# Patient Record
Sex: Female | Born: 1945
Health system: Southern US, Community
[De-identification: ages and names within clinical notes are randomized; demographics above are authoritative.]

## PROBLEM LIST (undated history)

## (undated) DIAGNOSIS — I10 Essential (primary) hypertension: Secondary | ICD-10-CM

## (undated) DIAGNOSIS — H409 Unspecified glaucoma: Secondary | ICD-10-CM

## (undated) DIAGNOSIS — E119 Type 2 diabetes mellitus without complications: Secondary | ICD-10-CM

## (undated) DIAGNOSIS — E785 Hyperlipidemia, unspecified: Secondary | ICD-10-CM

## (undated) HISTORY — DX: Essential (primary) hypertension: I10

## (undated) HISTORY — PX: ABDOMINAL HYSTERECTOMY: SHX81

## (undated) HISTORY — DX: Type 2 diabetes mellitus without complications: E11.9

## (undated) HISTORY — DX: Unspecified glaucoma: H40.9

## (undated) HISTORY — PX: HYSTEROTOMY: SHX1776

## (undated) HISTORY — DX: Hyperlipidemia, unspecified: E78.5

---

## 2004-04-02 ENCOUNTER — Ambulatory Visit: Payer: Self-pay | Admitting: Family Medicine

## 2005-12-21 ENCOUNTER — Emergency Department: Payer: Self-pay | Admitting: Emergency Medicine

## 2006-08-03 ENCOUNTER — Ambulatory Visit: Payer: Self-pay | Admitting: Internal Medicine

## 2006-08-05 ENCOUNTER — Ambulatory Visit: Payer: Self-pay | Admitting: Internal Medicine

## 2006-09-11 ENCOUNTER — Ambulatory Visit: Payer: Self-pay | Admitting: Gastroenterology

## 2006-10-29 ENCOUNTER — Ambulatory Visit: Payer: Self-pay | Admitting: Internal Medicine

## 2009-06-19 ENCOUNTER — Ambulatory Visit: Payer: Self-pay | Admitting: Internal Medicine

## 2009-12-17 DIAGNOSIS — I10 Essential (primary) hypertension: Secondary | ICD-10-CM | POA: Insufficient documentation

## 2009-12-17 DIAGNOSIS — I152 Hypertension secondary to endocrine disorders: Secondary | ICD-10-CM | POA: Insufficient documentation

## 2012-01-26 DIAGNOSIS — E049 Nontoxic goiter, unspecified: Secondary | ICD-10-CM | POA: Insufficient documentation

## 2013-11-04 DIAGNOSIS — F17200 Nicotine dependence, unspecified, uncomplicated: Secondary | ICD-10-CM | POA: Insufficient documentation

## 2014-08-20 DIAGNOSIS — F17210 Nicotine dependence, cigarettes, uncomplicated: Secondary | ICD-10-CM | POA: Diagnosis not present

## 2014-08-20 DIAGNOSIS — E785 Hyperlipidemia, unspecified: Secondary | ICD-10-CM | POA: Diagnosis not present

## 2014-08-20 DIAGNOSIS — I6521 Occlusion and stenosis of right carotid artery: Secondary | ICD-10-CM | POA: Diagnosis not present

## 2014-08-20 DIAGNOSIS — R930 Abnormal findings on diagnostic imaging of skull and head, not elsewhere classified: Secondary | ICD-10-CM | POA: Diagnosis not present

## 2014-08-20 DIAGNOSIS — I10 Essential (primary) hypertension: Secondary | ICD-10-CM | POA: Diagnosis not present

## 2014-08-20 DIAGNOSIS — E049 Nontoxic goiter, unspecified: Secondary | ICD-10-CM | POA: Diagnosis not present

## 2014-08-20 DIAGNOSIS — R42 Dizziness and giddiness: Secondary | ICD-10-CM | POA: Diagnosis not present

## 2014-08-20 DIAGNOSIS — E119 Type 2 diabetes mellitus without complications: Secondary | ICD-10-CM | POA: Diagnosis not present

## 2014-08-20 DIAGNOSIS — Z8673 Personal history of transient ischemic attack (TIA), and cerebral infarction without residual deficits: Secondary | ICD-10-CM | POA: Diagnosis not present

## 2014-08-20 DIAGNOSIS — R51 Headache: Secondary | ICD-10-CM | POA: Diagnosis not present

## 2014-08-20 DIAGNOSIS — R2689 Other abnormalities of gait and mobility: Secondary | ICD-10-CM | POA: Diagnosis not present

## 2014-10-24 DIAGNOSIS — F329 Major depressive disorder, single episode, unspecified: Secondary | ICD-10-CM | POA: Diagnosis not present

## 2014-10-24 DIAGNOSIS — Z23 Encounter for immunization: Secondary | ICD-10-CM | POA: Diagnosis not present

## 2014-10-24 DIAGNOSIS — E782 Mixed hyperlipidemia: Secondary | ICD-10-CM | POA: Diagnosis not present

## 2014-10-24 DIAGNOSIS — Z79899 Other long term (current) drug therapy: Secondary | ICD-10-CM | POA: Diagnosis not present

## 2014-10-24 DIAGNOSIS — E119 Type 2 diabetes mellitus without complications: Secondary | ICD-10-CM | POA: Diagnosis not present

## 2014-10-24 DIAGNOSIS — I10 Essential (primary) hypertension: Secondary | ICD-10-CM | POA: Diagnosis not present

## 2015-02-01 DIAGNOSIS — F329 Major depressive disorder, single episode, unspecified: Secondary | ICD-10-CM | POA: Diagnosis not present

## 2015-02-01 DIAGNOSIS — Z23 Encounter for immunization: Secondary | ICD-10-CM | POA: Diagnosis not present

## 2015-02-01 DIAGNOSIS — Z720 Tobacco use: Secondary | ICD-10-CM | POA: Diagnosis not present

## 2015-02-01 DIAGNOSIS — D329 Benign neoplasm of meninges, unspecified: Secondary | ICD-10-CM | POA: Diagnosis not present

## 2015-02-01 DIAGNOSIS — E782 Mixed hyperlipidemia: Secondary | ICD-10-CM | POA: Diagnosis not present

## 2015-02-01 DIAGNOSIS — E119 Type 2 diabetes mellitus without complications: Secondary | ICD-10-CM | POA: Diagnosis not present

## 2015-02-01 DIAGNOSIS — I10 Essential (primary) hypertension: Secondary | ICD-10-CM | POA: Diagnosis not present

## 2015-08-27 DIAGNOSIS — E139 Other specified diabetes mellitus without complications: Secondary | ICD-10-CM | POA: Diagnosis not present

## 2015-08-27 DIAGNOSIS — K047 Periapical abscess without sinus: Secondary | ICD-10-CM | POA: Insufficient documentation

## 2016-01-24 DIAGNOSIS — Z23 Encounter for immunization: Secondary | ICD-10-CM | POA: Diagnosis not present

## 2016-01-24 DIAGNOSIS — I2510 Atherosclerotic heart disease of native coronary artery without angina pectoris: Secondary | ICD-10-CM | POA: Diagnosis not present

## 2016-01-24 DIAGNOSIS — Z1231 Encounter for screening mammogram for malignant neoplasm of breast: Secondary | ICD-10-CM | POA: Diagnosis not present

## 2016-01-24 DIAGNOSIS — F329 Major depressive disorder, single episode, unspecified: Secondary | ICD-10-CM | POA: Diagnosis not present

## 2016-01-24 DIAGNOSIS — E119 Type 2 diabetes mellitus without complications: Secondary | ICD-10-CM | POA: Diagnosis not present

## 2016-01-24 DIAGNOSIS — E049 Nontoxic goiter, unspecified: Secondary | ICD-10-CM | POA: Diagnosis not present

## 2016-01-24 DIAGNOSIS — I10 Essential (primary) hypertension: Secondary | ICD-10-CM | POA: Diagnosis not present

## 2016-01-24 DIAGNOSIS — Z0000 Encounter for general adult medical examination without abnormal findings: Secondary | ICD-10-CM | POA: Diagnosis not present

## 2016-01-24 DIAGNOSIS — Z720 Tobacco use: Secondary | ICD-10-CM | POA: Diagnosis not present

## 2016-02-11 DIAGNOSIS — Z1231 Encounter for screening mammogram for malignant neoplasm of breast: Secondary | ICD-10-CM | POA: Diagnosis not present

## 2016-05-26 DIAGNOSIS — E119 Type 2 diabetes mellitus without complications: Secondary | ICD-10-CM | POA: Diagnosis not present

## 2016-05-26 DIAGNOSIS — H40023 Open angle with borderline findings, high risk, bilateral: Secondary | ICD-10-CM | POA: Diagnosis not present

## 2016-06-24 ENCOUNTER — Ambulatory Visit: Payer: Self-pay | Admitting: Family Medicine

## 2016-06-24 ENCOUNTER — Ambulatory Visit (INDEPENDENT_AMBULATORY_CARE_PROVIDER_SITE_OTHER): Payer: Medicare HMO | Admitting: Family Medicine

## 2016-06-24 ENCOUNTER — Encounter: Payer: Self-pay | Admitting: Family Medicine

## 2016-06-24 VITALS — BP 132/82 | HR 100 | Resp 16 | Ht 62.0 in | Wt 136.6 lb

## 2016-06-24 DIAGNOSIS — E049 Nontoxic goiter, unspecified: Secondary | ICD-10-CM | POA: Diagnosis not present

## 2016-06-24 DIAGNOSIS — G4700 Insomnia, unspecified: Secondary | ICD-10-CM | POA: Diagnosis not present

## 2016-06-24 DIAGNOSIS — Z23 Encounter for immunization: Secondary | ICD-10-CM

## 2016-06-24 DIAGNOSIS — E119 Type 2 diabetes mellitus without complications: Secondary | ICD-10-CM

## 2016-06-24 DIAGNOSIS — I10 Essential (primary) hypertension: Secondary | ICD-10-CM

## 2016-06-24 DIAGNOSIS — F17200 Nicotine dependence, unspecified, uncomplicated: Secondary | ICD-10-CM | POA: Diagnosis not present

## 2016-06-24 DIAGNOSIS — E1165 Type 2 diabetes mellitus with hyperglycemia: Secondary | ICD-10-CM | POA: Insufficient documentation

## 2016-06-24 MED ORDER — ZOSTER VAC RECOMB ADJUVANTED 50 MCG/0.5ML IM SUSR: 0.5000 mL | Freq: Once | INTRAMUSCULAR | 1 refills | Status: AC

## 2016-06-24 NOTE — Patient Instructions (Signed)
Insomnia Insomnia is a sleep disorder that makes it difficult to fall asleep or to stay asleep. Insomnia can cause tiredness (fatigue), low energy, difficulty concentrating, mood swings, and poor performance at work or school. There are three different ways to classify insomnia:  Difficulty falling asleep.  Difficulty staying asleep.  Waking up too early in the morning. Any type of insomnia can be long-term (chronic) or short-term (acute). Both are common. Short-term insomnia usually lasts for three months or less. Chronic insomnia occurs at least three times a week for longer than three months. What are the causes? Insomnia may be caused by another condition, situation, or substance, such as:  Anxiety.  Certain medicines.  Gastroesophageal reflux disease (GERD) or other gastrointestinal conditions.  Asthma or other breathing conditions.  Restless legs syndrome, sleep apnea, or other sleep disorders.  Chronic pain.  Menopause. This may include hot flashes.  Stroke.  Abuse of alcohol, tobacco, or illegal drugs.  Depression.  Caffeine.  Neurological disorders, such as Alzheimer disease.  An overactive thyroid (hyperthyroidism). The cause of insomnia may not be known. What increases the risk? Risk factors for insomnia include:  Gender. Women are more commonly affected than men.  Age. Insomnia is more common as you get older.  Stress. This may involve your professional or personal life.  Income. Insomnia is more common in people with lower income.  Lack of exercise.  Irregular work schedule or night shifts.  Traveling between different time zones. What are the signs or symptoms? If you have insomnia, trouble falling asleep or trouble staying asleep is the main symptom. This may lead to other symptoms, such as:  Feeling fatigued.  Feeling nervous about going to sleep.  Not feeling rested in the morning.  Having trouble concentrating.  Feeling irritable,  anxious, or depressed. How is this treated? Treatment for insomnia depends on the cause. If your insomnia is caused by an underlying condition, treatment will focus on addressing the condition. Treatment may also include:  Medicines to help you sleep.  Counseling or therapy.  Lifestyle adjustments. Follow these instructions at home:  Take medicines only as directed by your health care provider.  Keep regular sleeping and waking hours. Avoid naps.  Keep a sleep diary to help you and your health care provider figure out what could be causing your insomnia. Include:  When you sleep.  When you wake up during the night.  How well you sleep.  How rested you feel the next day.  Any side effects of medicines you are taking.  What you eat and drink.  Make your bedroom a comfortable place where it is easy to fall asleep:  Put up shades or special blackout curtains to block light from outside.  Use a white noise machine to block noise.  Keep the temperature cool.  Exercise regularly as directed by your health care provider. Avoid exercising right before bedtime.  Use relaxation techniques to manage stress. Ask your health care provider to suggest some techniques that may work well for you. These may include:  Breathing exercises.  Routines to release muscle tension.  Visualizing peaceful scenes.  Cut back on alcohol, caffeinated beverages, and cigarettes, especially close to bedtime. These can disrupt your sleep.  Do not overeat or eat spicy foods right before bedtime. This can lead to digestive discomfort that can make it hard for you to sleep.  Limit screen use before bedtime. This includes:  Watching TV.  Using your smartphone, tablet, and computer.  Stick to a   routine. This can help you fall asleep faster. Try to do a quiet activity, brush your teeth, and go to bed at the same time each night.  Get out of bed if you are still awake after 15 minutes of trying to  sleep. Keep the lights down, but try reading or doing a quiet activity. When you feel sleepy, go back to bed.  Make sure that you drive carefully. Avoid driving if you feel very sleepy.  Keep all follow-up appointments as directed by your health care provider. This is important. Contact a health care provider if:  You are tired throughout the day or have trouble in your daily routine due to sleepiness.  You continue to have sleep problems or your sleep problems get worse. Get help right away if:  You have serious thoughts about hurting yourself or someone else. This information is not intended to replace advice given to you by your health care provider. Make sure you discuss any questions you have with your health care provider. Document Released: 03/07/2000 Document Revised: 08/10/2015 Document Reviewed: 12/09/2013 Elsevier Interactive Patient Education  2017 [employer].   Steps to Quit Smoking Smoking tobacco can be harmful to your health and can affect almost every organ in your body. Smoking puts you, and those around you, at risk for developing many serious chronic diseases. Quitting smoking is difficult, but it is one of the best things that you can do for your health. It is never too late to quit. What are the benefits of quitting smoking? When you quit smoking, you lower your risk of developing serious diseases and conditions, such as:  Lung cancer or lung disease, such as COPD.  Heart disease.  Stroke.  Heart attack.  Infertility.  Osteoporosis and bone fractures. Additionally, symptoms such as coughing, wheezing, and shortness of breath may get better when you quit. You may also find that you get sick less often because your body is stronger at fighting off colds and infections. If you are pregnant, quitting smoking can help to reduce your chances of having a baby of low birth weight. How do I get ready to quit? When you decide to quit smoking, create a plan to make  sure that you are successful. Before you quit:  Pick a date to quit. Set a date within the next two weeks to give you time to prepare.  Write down the reasons why you are quitting. Keep this list in places where you will see it often, such as on your bathroom mirror or in your car or wallet.  Identify the people, places, things, and activities that make you want to smoke (triggers) and avoid them. Make sure to take these actions:  Throw away all cigarettes at home, at work, and in your car.  Throw away smoking accessories, such as Scientist, research (medical).  Clean your car and make sure to empty the ashtray.  Clean your home, including curtains and carpets.  Tell your family, friends, and coworkers that you are quitting. Support from your loved ones can make quitting easier.  Talk with your health care provider about your options for quitting smoking.  Find out what treatment options are covered by your health insurance. What strategies can I use to quit smoking? Talk with your healthcare provider about different strategies to quit smoking. Some strategies include:  Quitting smoking altogether instead of gradually lessening how much you smoke over a period of time. Research shows that quitting "cold [place]" is more successful than gradually  quitting.  Attending in-person counseling to help you build problem-solving skills. You are more likely to have success in quitting if you attend several counseling sessions. Even short sessions of 10 minutes can be effective.  Finding resources and support systems that can help you to quit smoking and remain smoke-free after you quit. These resources are most helpful when you use them often. They can include:  Online chats with a Social worker.  Telephone quitlines.  Printed Furniture conservator/restorer.  Support groups or group counseling.  Text messaging programs.  Mobile phone applications.  Taking medicines to help you quit smoking. (If you are  pregnant or breastfeeding, talk with your health care provider first.) Some medicines contain nicotine and some do not. Both types of medicines help with cravings, but the medicines that include nicotine help to relieve withdrawal symptoms. Your health care provider may recommend:  Nicotine patches, gum, or lozenges.  Nicotine inhalers or sprays.  Non-nicotine medicine that is taken by mouth. Talk with your health care provider about combining strategies, such as taking medicines while you are also receiving in-person counseling. Using these two strategies together makes you more likely to succeed in quitting than if you used either strategy on its own. If you are pregnant or breastfeeding, talk with your health care provider about finding counseling or other support strategies to quit smoking. Do not take medicine to help you quit smoking unless told to do so by your health care provider. What things can I do to make it easier to quit? Quitting smoking might feel overwhelming at first, but there is a lot that you can do to make it easier. Take these important actions:  Reach out to your family and friends and ask that they support and encourage you during this time. Call telephone quitlines, reach out to support groups, or work with a counselor for support.  Ask people who smoke to avoid smoking around you.  Avoid places that trigger you to smoke, such as bars, parties, or smoke-break areas at work.  Spend time around people who do not smoke.  Lessen stress in your life, because stress can be a smoking trigger for some people. To lessen stress, try:  Exercising regularly.  Deep-breathing exercises.  Yoga.  Meditating.  Performing a body scan. This involves closing your eyes, scanning your body from head to toe, and noticing which parts of your body are particularly tense. Purposefully relax the muscles in those areas.  Download or purchase mobile phone or tablet apps (applications)  that can help you stick to your quit plan by providing reminders, tips, and encouragement. There are many free apps, such as QuitGuide from the [employer] Office manager for Disease Control and Prevention). You can find other support for quitting smoking (smoking cessation) through smokefree.gov and other websites. How will I feel when I quit smoking? Within the first 24 hours of quitting smoking, you may start to feel some withdrawal symptoms. These symptoms are usually most noticeable 2-3 days after quitting, but they usually do not last beyond 2-3 weeks. Changes or symptoms that you might experience include:  Mood swings.  Restlessness, anxiety, or irritation.  Difficulty concentrating.  Dizziness.  Strong cravings for sugary foods in addition to nicotine.  Mild weight gain.  Constipation.  Nausea.  Coughing or a sore throat.  Changes in how your medicines work in your body.  A depressed mood.  Difficulty sleeping (insomnia). After the first 2-3 weeks of quitting, you may start to notice more positive results, such as:  Improved sense of smell and taste.  Decreased coughing and sore throat.  Slower heart rate.  Lower blood pressure.  Clearer skin.  The ability to breathe more easily.  Fewer sick days. Quitting smoking is very challenging for most people. Do not get discouraged if you are not successful the first time. Some people need to make many attempts to quit before they achieve long-term success. Do your best to stick to your quit plan, and talk with your health care provider if you have any questions or concerns. This information is not intended to replace advice given to you by your health care provider. Make sure you discuss any questions you have with your health care provider. Document Released: 03/04/2001 Document Revised: 11/06/2015 Document Reviewed: 07/25/2014 Elsevier Interactive Patient Education  2017 [employer].

## 2016-06-25 LAB — COMPREHENSIVE METABOLIC PANEL
ALK PHOS: 86 IU/L (ref 39–117)
ALT: 17 IU/L (ref 0–32)
AST: 21 IU/L (ref 0–40)
Albumin/Globulin Ratio: 1.6 (ref 1.2–2.2)
Albumin: 4.6 g/dL (ref 3.6–4.8)
BUN/Creatinine Ratio: 10 — ABNORMAL LOW (ref 12–28)
BUN: 8 mg/dL (ref 8–27)
Bilirubin Total: 0.3 mg/dL (ref 0.0–1.2)
CO2: 27 mmol/L (ref 18–29)
CREATININE: 0.83 mg/dL (ref 0.57–1.00)
Calcium: 10 mg/dL (ref 8.7–10.3)
Chloride: 101 mmol/L (ref 96–106)
GFR calc Af Amer: 83 mL/min/{1.73_m2} (ref >59)
GFR, EST NON AFRICAN AMERICAN: 72 mL/min/{1.73_m2} (ref >59)
GLOBULIN, TOTAL: 2.8 g/dL (ref 1.5–4.5)
Glucose: 165 mg/dL — ABNORMAL HIGH (ref 65–99)
Potassium: 4.4 mmol/L (ref 3.5–5.2)
Sodium: 143 mmol/L (ref 134–144)
Total Protein: 7.4 g/dL (ref 6.0–8.5)

## 2016-06-25 LAB — CBC
HEMOGLOBIN: 13.7 g/dL (ref 11.1–15.9)
Hematocrit: 43.1 % (ref 34.0–46.6)
MCH: 24 pg — AB (ref 26.6–33.0)
MCHC: 31.8 g/dL (ref 31.5–35.7)
MCV: 76 fL — AB (ref 79–97)
Platelets: 386 10*3/uL — ABNORMAL HIGH (ref 150–379)
RBC: 5.7 x10E6/uL — AB (ref 3.77–5.28)
RDW: 15.5 % — ABNORMAL HIGH (ref 12.3–15.4)
WBC: 6 10*3/uL (ref 3.4–10.8)

## 2016-06-25 LAB — TSH: TSH: 0.483 u[IU]/mL (ref 0.450–4.500)

## 2016-06-25 LAB — HEMOGLOBIN A1C
Est. average glucose Bld gHb Est-mCnc: 151 mg/dL
Hgb A1c MFr Bld: 6.9 % — ABNORMAL HIGH (ref 4.8–5.6)

## 2016-06-25 LAB — LIPID PANEL
CHOLESTEROL TOTAL: 284 mg/dL — AB (ref 100–199)
Chol/HDL Ratio: 5.4 ratio — ABNORMAL HIGH (ref 0.0–4.4)
HDL: 53 mg/dL (ref >39)
LDL CALC: 196 mg/dL — AB (ref 0–99)
TRIGLYCERIDES: 174 mg/dL — AB (ref 0–149)
VLDL Cholesterol Cal: 35 mg/dL (ref 5–40)

## 2016-06-25 LAB — MICROALBUMIN / CREATININE URINE RATIO
CREATININE, UR: 55.9 mg/dL
MICROALB/CREAT RATIO: 17.2 mg/g{creat} (ref 0.0–30.0)
Microalbumin, Urine: 9.6 ug/mL

## 2016-06-25 LAB — VITAMIN D 25 HYDROXY (VIT D DEFICIENCY, FRACTURES): VIT D 25 HYDROXY: 31.1 ng/mL (ref 30.0–100.0)

## 2016-06-26 ENCOUNTER — Other Ambulatory Visit: Payer: Self-pay | Admitting: Family Medicine

## 2016-06-26 MED ORDER — ATORVASTATIN CALCIUM 40 MG PO TABS: 40.0000 mg | ORAL_TABLET | Freq: Every day | ORAL | 2 refills | Status: DC

## 2016-06-26 NOTE — Progress Notes (Signed)
Date:  06/24/2016   Name:  Ann Chaney   DOB:  [birth year]   MRN:  [id number]  PCP:  Adline Potter, MD    Chief Complaint: Establish Care   History of Present Illness:  This is a 71 y.o. female seen for initial visit. T2DM on metformin, last a1c 6.2% in Oct., HTN on lisinopril since 2009, glaucoma on eye drops, seeing optho next week. Hx hyperthyroidism in 1988, on asa for prevention since 2008. Takes cinnamon, coQ10, and tumeric for prevention as well. Smoker, has quit in past. Would like to quit metformin if possible. Father died MI age 76, mother died breast ca/MI/COPD age 84, sister with glaucoma. Mammo 12/2015 ok, has Cologuard kit from [place]. Imms UTD except zoster.  Review of Systems:  Review of Systems  Constitutional: Negative for chills and fever.  HENT: Negative for ear pain and sinus pain.   Eyes: Negative for pain.  Respiratory: Negative for cough and shortness of breath.   Cardiovascular: Negative for chest pain and leg swelling.  Gastrointestinal: Negative for abdominal pain.  Endocrine: Negative for polydipsia and polyuria.  Genitourinary: Negative for difficulty urinating.  Neurological: Negative for tremors, syncope and light-headedness.    Patient Active Problem List   Diagnosis Date Noted  . Diabetes mellitus type 2, controlled, without complications ([place]) 54/27/0623  . Dental abscess 08/27/2015  . Smoker 11/04/2013  . Goiter 01/26/2012  . Hypertension, benign 12/17/2009    Prior to Admission medications   Medication Sig Start Date End Date Taking? Authorizing Provider  aspirin EC 81 MG tablet Take 81 mg by mouth.   Yes Historical Provider, MD  latanoprost (XALATAN) 0.005 % ophthalmic solution 1 drop at bedtime.   Yes Historical Provider, MD  lisinopril (PRINIVIL,ZESTRIL) 40 MG tablet Take 40 mg by mouth. 01/24/16  Yes Historical Provider, MD  metFORMIN (GLUCOPHAGE) 500 MG tablet Take 1,000 mg by mouth 2 (two) times daily with a meal.  01/24/16  Yes  Historical Provider, MD  Multiple Vitamin (MULTIVITAMIN) capsule Take 1 capsule by mouth daily.   Yes Historical Provider, MD    No Known Allergies  Past Surgical History:  Procedure Laterality Date  . HYSTEROTOMY      Social History  Substance Use Topics  . Smoking status: Current Every Day Smoker    Packs/day: 0.50    Types: Cigarettes  . Smokeless tobacco: Never Used  . Alcohol use No    Family History  Problem Relation Age of Onset  . Cancer Mother   . Emphysema Mother   . Diabetes Mother   . Glaucoma Father   . Dementia Father     Medication list has been reviewed and updated.  Physical Examination: BP 132/82   Pulse 100   Resp 16   Ht _0  (1.575 m)   Wt 136 lb 9.6 oz (62 kg)   SpO2 100%   BMI 24.98 kg/m   Physical Exam  Constitutional: She is oriented to person, place, and time. She appears well-developed and well-nourished.  HENT:  Head: Normocephalic and atraumatic.  Right Ear: External ear normal.  Left Ear: External ear normal.  Nose: Nose normal.  Mouth/Throat: Oropharynx is clear and moist.  TMs clear  Eyes: Conjunctivae and EOM are normal. Pupils are equal, round, and reactive to light.  Neck: Neck supple.  Diffuse moderate goiter  Cardiovascular: Normal rate, regular rhythm and normal heart sounds.   Pulmonary/Chest: Effort normal and breath sounds normal.  Abdominal: Soft. She exhibits no distension and  no mass. There is no tenderness.  Musculoskeletal: She exhibits no edema.  Lymphadenopathy:    She has no cervical adenopathy.  Neurological: She is alert and oriented to person, place, and time. Coordination normal.  Romberg negative, gait normal  Skin: Skin is warm and dry.  Psychiatric: She has a normal mood and affect. Her behavior is normal.  Nursing note and vitals reviewed.   Assessment and Plan:  1. Controlled type 2 diabetes mellitus without complication, without long-term current use of insulin ([place]) Well controlled in  Oct, consider d/c metformin based on labs - HgB A1c - Lipid Profile - Vitamin D (25 hydroxy) - Urine Microalbumin w/creat. ratio  2. Hypertension, benign Well controlled on lisinopril - Comprehensive Metabolic Panel (CMET) - CBC  3. Goiter Hx hyperthyroidism in distant past - TSH  4. Insomnia, unspecified type Sleep hygiene recs given  5. Smoker Strongly advised cessation - Nurse to provide smoking / tobacco cessation education  6. Need for zoster vaccination Shingrix rx sent  Return in about 4 weeks (around 07/22/2016).   45 minutes spent with pt over half in counseling  [facility]  06/26/2016

## 2016-06-26 NOTE — Progress Notes (Signed)
lipitor

## 2016-07-09 DIAGNOSIS — H401131 Primary open-angle glaucoma, bilateral, mild stage: Secondary | ICD-10-CM | POA: Diagnosis not present

## 2016-07-22 ENCOUNTER — Encounter: Payer: Self-pay | Admitting: Family Medicine

## 2016-07-22 ENCOUNTER — Ambulatory Visit (INDEPENDENT_AMBULATORY_CARE_PROVIDER_SITE_OTHER): Payer: Medicare HMO | Admitting: Family Medicine

## 2016-07-22 VITALS — BP 122/80 | HR 93 | Resp 16 | Ht 62.0 in | Wt 134.0 lb

## 2016-07-22 DIAGNOSIS — I10 Essential (primary) hypertension: Secondary | ICD-10-CM | POA: Diagnosis not present

## 2016-07-22 DIAGNOSIS — G4700 Insomnia, unspecified: Secondary | ICD-10-CM | POA: Diagnosis not present

## 2016-07-22 DIAGNOSIS — H578 Other specified disorders of eye and adnexa: Secondary | ICD-10-CM | POA: Diagnosis not present

## 2016-07-22 DIAGNOSIS — H5789 Other specified disorders of eye and adnexa: Secondary | ICD-10-CM

## 2016-07-22 DIAGNOSIS — F17200 Nicotine dependence, unspecified, uncomplicated: Secondary | ICD-10-CM

## 2016-07-22 DIAGNOSIS — E785 Hyperlipidemia, unspecified: Secondary | ICD-10-CM | POA: Diagnosis not present

## 2016-07-22 DIAGNOSIS — E119 Type 2 diabetes mellitus without complications: Secondary | ICD-10-CM

## 2016-07-22 MED ORDER — PRAVASTATIN SODIUM 40 MG PO TABS: 40.0000 mg | ORAL_TABLET | Freq: Every day | ORAL | 2 refills | Status: DC

## 2016-07-22 MED ORDER — GABAPENTIN 100 MG PO CAPS: 100.0000 mg | ORAL_CAPSULE | Freq: Every day | ORAL | 2 refills | Status: DC

## 2016-07-22 NOTE — Progress Notes (Signed)
Date:  07/22/2016   Name:  Ann Chaney   DOB:  [birth year]   MRN:  [id number]  PCP:  Adline Potter, MD    Chief Complaint: Diabetes (100-121 BS avg ) and Conjunctivitis (Feels like something is in right eye. Red and wakes up with it matted shut. Saw Dr. Ellin Mayhew at [facility] center in [place] who said it is allergies but she feels it is not allergies.  Started 06/26/2016  after starting Glaucoma drops but this eye doctor feels it is allergies. )   History of Present Illness:  This is a 71 y.o. female seen for one month f/u. C/o R eye matting since started drops for glaucoma, optho told likley allergies but OS sxs. a1c 6.9% last month, kept on metformin, Lipitor added for HLD but unable to tolerate due to myalgas. Has cut back on sugar and salt in diet. Insomnia worse despite sleep hygiene changes, melatonin no help in past, wakes in middle of night, Denies depression but admits to lots of stress. Still smoking. Had URI three weeks ago, still has cough but getting better.  Review of Systems:  Review of Systems  Constitutional: Negative for chills and fever.  HENT: Negative for ear pain and sinus pressure.   Eyes: Negative for photophobia.  Respiratory: Negative for shortness of breath.   Cardiovascular: Negative for chest pain and leg swelling.  Endocrine: Negative for polydipsia and polyuria.  Genitourinary: Negative for difficulty urinating.  Neurological: Negative for syncope and light-headedness.    Patient Active Problem List   Diagnosis Date Noted  . Insomnia 07/22/2016  . Diabetes mellitus type 2, controlled, without complications ([place]) 14/97/0263  . Dental abscess 08/27/2015  . Smoker 11/04/2013  . Goiter 01/26/2012  . Hypertension, benign 12/17/2009    Prior to Admission medications   Medication Sig Start Date End Date Taking? Authorizing Provider  aspirin EC 81 MG tablet Take 81 mg by mouth.   Yes Historical Provider, MD  latanoprost (XALATAN) 0.005 % ophthalmic solution 1 drop  at bedtime.   Yes Historical Provider, MD  lisinopril (PRINIVIL,ZESTRIL) 40 MG tablet Take 40 mg by mouth. 01/24/16  Yes Historical Provider, MD  metFORMIN (GLUCOPHAGE) 500 MG tablet Take 1,000 mg by mouth 2 (two) times daily with a meal.  01/24/16  Yes Historical Provider, MD  Multiple Vitamin (MULTIVITAMIN) capsule Take 1 capsule by mouth daily.   Yes Historical Provider, MD  gabapentin (NEURONTIN) 100 MG capsule Take 1 capsule (100 mg total) by mouth at bedtime. 07/22/16   Adline Potter, MD  pravastatin (PRAVACHOL) 40 MG tablet Take 1 tablet (40 mg total) by mouth daily. 07/22/16   Adline Potter, MD    No Known Allergies  Past Surgical History:  Procedure Laterality Date  . HYSTEROTOMY      Social History  Substance Use Topics  . Smoking status: Current Every Day Smoker    Packs/day: 0.50    Types: Cigarettes  . Smokeless tobacco: Never Used  . Alcohol use No    Family History  Problem Relation Age of Onset  . Cancer Mother   . Emphysema Mother   . Diabetes Mother   . Glaucoma Father   . Dementia Father     Medication list has been reviewed and updated.  Physical Examination: BP 122/80   Pulse 93   Resp 16   Ht 5\' 2"  (1.575 m)   Wt 134 lb (60.8 kg)   BMI 24.51 kg/m   Physical Exam  Constitutional: She appears well-developed and  well-nourished.  Eyes: EOM are normal. Pupils are equal, round, and reactive to light.  R conjunctiva injected  Cardiovascular: Normal rate, regular rhythm and normal heart sounds.   Pulmonary/Chest: Effort normal and breath sounds normal.  Musculoskeletal: She exhibits no edema.  Neurological: She is alert.  Skin: Skin is warm and dry.  Psychiatric: She has a normal mood and affect. Her behavior is normal.  Nursing note and vitals reviewed.   Assessment and Plan:  1. Controlled type 2 diabetes mellitus without complication, without long-term current use of insulin ([facility]) Continue metformin, weight loss, recheck a1c next visit  2.  Hypertension, benign Well controlled on lisinopril  3. Insomnia, unspecified type Trial gabapentin qhs  4. Smoker Strongly advised cessation  5. Hyperlipidemia, unspecified hyperlipidemia type Trial Pravachol given Lipitor intolerance, consider lipids/ALT next visit  6. Irritation of right eye Pt to call ophtho back, doubt allergy as unilateral  Return in about 2 months (around 09/21/2016).  Satira Anis. [facility]  07/22/2016

## 2016-08-15 ENCOUNTER — Other Ambulatory Visit: Payer: Self-pay | Admitting: Family Medicine

## 2016-08-15 MED ORDER — PRAVASTATIN SODIUM 40 MG PO TABS: 40.0000 mg | ORAL_TABLET | Freq: Every day | ORAL | 3 refills | Status: DC

## 2016-09-15 ENCOUNTER — Telehealth: Payer: Self-pay | Admitting: Family Medicine

## 2016-09-15 NOTE — Telephone Encounter (Signed)
Called pt to schedule Annual Wellness Visit with [facility]  - knb  °

## 2016-09-29 ENCOUNTER — Encounter: Payer: Self-pay | Admitting: Family Medicine

## 2016-09-29 ENCOUNTER — Ambulatory Visit (INDEPENDENT_AMBULATORY_CARE_PROVIDER_SITE_OTHER): Payer: Medicare HMO | Admitting: Family Medicine

## 2016-09-29 VITALS — BP 138/82 | HR 94 | Resp 16 | Ht 62.0 in | Wt 136.8 lb

## 2016-09-29 DIAGNOSIS — I10 Essential (primary) hypertension: Secondary | ICD-10-CM

## 2016-09-29 DIAGNOSIS — R718 Other abnormality of red blood cells: Secondary | ICD-10-CM

## 2016-09-29 DIAGNOSIS — E119 Type 2 diabetes mellitus without complications: Secondary | ICD-10-CM | POA: Diagnosis not present

## 2016-09-29 DIAGNOSIS — Z1211 Encounter for screening for malignant neoplasm of colon: Secondary | ICD-10-CM | POA: Diagnosis not present

## 2016-09-29 DIAGNOSIS — E785 Hyperlipidemia, unspecified: Secondary | ICD-10-CM

## 2016-09-29 DIAGNOSIS — G4700 Insomnia, unspecified: Secondary | ICD-10-CM

## 2016-09-29 DIAGNOSIS — F17200 Nicotine dependence, unspecified, uncomplicated: Secondary | ICD-10-CM

## 2016-09-29 DIAGNOSIS — F325 Major depressive disorder, single episode, in full remission: Secondary | ICD-10-CM | POA: Diagnosis not present

## 2016-09-29 NOTE — Progress Notes (Signed)
Date:  09/29/2016   Name:  Ann Chaney   DOB:  17-Dec-1945   MRN:  [id number]  PCP:  Adline Potter, MD    Chief Complaint: Diabetes (AVG [phone]) and Hyperlipidemia (Cholesterol meds cause nausea )   History of Present Illness:  This is a 71 y.o. female seen for two month f/u. Stopped Pravachol due to nausea, previously intolerant Lipitor. Home BGs in 120s, gabapentin helping insomnia. Still smoking, several recent deaths in family, OTC aids ineffective, not interested in rx at this time. Cologuard came back negative. Eye irritation resolved. Blood work in May showed microcytosis without anemia.  Review of Systems:  Review of Systems  Constitutional: Negative for chills and fever.  Respiratory: Negative for cough and shortness of breath.   Cardiovascular: Negative for chest pain and leg swelling.  Endocrine: Negative for polydipsia and polyuria.  Genitourinary: Negative for difficulty urinating.  Neurological: Negative for syncope and light-headedness.    Patient Active Problem List   Diagnosis Date Noted  . Insomnia 07/22/2016  . Hyperlipidemia 07/22/2016  . Diabetes mellitus type 2, controlled, without complications ([place]) 67/20/9470  . Dental abscess 08/27/2015  . Smoker 11/04/2013  . Goiter 01/26/2012  . Hypertension, benign 12/17/2009    Prior to Admission medications   Medication Sig Start Date End Date Taking? Authorizing Provider  aspirin EC 81 MG tablet Take 81 mg by mouth.   Yes [provider]  gabapentin (NEURONTIN) 100 MG capsule Take 1 capsule (100 mg total) by mouth at bedtime. 07/22/16  Yes Hinley Brimage, Gwyndolyn Saxon, MD  latanoprost (XALATAN) 0.005 % ophthalmic solution 1 drop at bedtime.   Yes [provider]  lisinopril (PRINIVIL,ZESTRIL) 40 MG tablet Take 40 mg by mouth. 01/24/16  Yes [provider]  metFORMIN (GLUCOPHAGE) 500 MG tablet Take 1,000 mg by mouth 2 (two) times daily with a meal.  01/24/16  Yes [provider]  Multiple  Vitamin (MULTIVITAMIN) capsule Take 1 capsule by mouth daily.   Yes [provider]    Allergies  Allergen Reactions  . Atorvastatin Other (See Comments)    Myalgias    Past Surgical History:  Procedure Laterality Date  . HYSTEROTOMY      Social History  Substance Use Topics  . Smoking status: Current Every Day Smoker    Packs/day: 0.50    Types: Cigarettes  . Smokeless tobacco: Never Used  . Alcohol use No    Family History  Problem Relation Age of Onset  . Cancer Mother   . Emphysema Mother   . Diabetes Mother   . Glaucoma Father   . Dementia Father     Medication list has been reviewed and updated.  Physical Examination: BP 138/82   Pulse 94   Resp 16   Ht 5\' 2"  (1.575 m)   Wt 136 lb 12.8 oz (62.1 kg)   SpO2 98%   BMI 25.02 kg/m   Physical Exam  Constitutional: She appears well-developed and well-nourished.  Cardiovascular: Normal rate and normal heart sounds.   Pulmonary/Chest: Effort normal and breath sounds normal.  Musculoskeletal: She exhibits no edema.  Neurological: She is alert.  Skin: Skin is warm and dry.  Psychiatric: She has a normal mood and affect. Her behavior is normal.  Nursing note and vitals reviewed.   Assessment and Plan:  1. Controlled type 2 diabetes mellitus without complication, without long-term current use of insulin (HCC) Well controlled on metformin - HgB A1c - B12  2. Hypertension, benign Adequate control on lisinopril -  CBC  3. Smoker Strongly encouraged cessation  4. Insomnia, unspecified type Improved on gabapentin  5. Hyperlipidemia, unspecified hyperlipidemia type Intolerant statins  6. Microcytosis Recheck CBC  7. Screening for colon cancer Cologuard negative, repeat in 3 yrs  Return in about 3 months (around 12/30/2016).  Satira Anis. [facility]  09/29/2016

## 2016-09-30 LAB — CBC
HEMOGLOBIN: 13.5 g/dL (ref 11.1–15.9)
Hematocrit: 41.7 % (ref 34.0–46.6)
MCH: 24.6 pg — ABNORMAL LOW (ref 26.6–33.0)
MCHC: 32.4 g/dL (ref 31.5–35.7)
MCV: 76 fL — ABNORMAL LOW (ref 79–97)
Platelets: 334 10*3/uL (ref 150–379)
RBC: 5.49 x10E6/uL — AB (ref 3.77–5.28)
RDW: 15.7 % — ABNORMAL HIGH (ref 12.3–15.4)
WBC: 5.6 10*3/uL (ref 3.4–10.8)

## 2016-09-30 LAB — HEMOGLOBIN A1C
ESTIMATED AVERAGE GLUCOSE: 148 mg/dL
Hgb A1c MFr Bld: 6.8 % — ABNORMAL HIGH (ref 4.8–5.6)

## 2016-09-30 LAB — VITAMIN B12: VITAMIN B 12: 369 pg/mL (ref 232–1245)

## 2016-11-17 ENCOUNTER — Encounter: Payer: Self-pay | Admitting: Family Medicine

## 2016-11-17 DIAGNOSIS — F325 Major depressive disorder, single episode, in full remission: Secondary | ICD-10-CM | POA: Insufficient documentation

## 2016-11-17 DIAGNOSIS — R718 Other abnormality of red blood cells: Secondary | ICD-10-CM | POA: Insufficient documentation

## 2016-12-30 ENCOUNTER — Ambulatory Visit (INDEPENDENT_AMBULATORY_CARE_PROVIDER_SITE_OTHER): Payer: Medicare HMO | Admitting: Family Medicine

## 2016-12-30 ENCOUNTER — Encounter: Payer: Self-pay | Admitting: Family Medicine

## 2016-12-30 VITALS — BP 118/78 | HR 98 | Resp 16 | Ht 62.0 in | Wt 137.0 lb

## 2016-12-30 DIAGNOSIS — F17200 Nicotine dependence, unspecified, uncomplicated: Secondary | ICD-10-CM | POA: Diagnosis not present

## 2016-12-30 DIAGNOSIS — I10 Essential (primary) hypertension: Secondary | ICD-10-CM | POA: Diagnosis not present

## 2016-12-30 DIAGNOSIS — E119 Type 2 diabetes mellitus without complications: Secondary | ICD-10-CM | POA: Diagnosis not present

## 2016-12-30 DIAGNOSIS — G4700 Insomnia, unspecified: Secondary | ICD-10-CM | POA: Diagnosis not present

## 2016-12-30 DIAGNOSIS — E785 Hyperlipidemia, unspecified: Secondary | ICD-10-CM

## 2016-12-30 DIAGNOSIS — Z23 Encounter for immunization: Secondary | ICD-10-CM | POA: Diagnosis not present

## 2016-12-30 NOTE — Progress Notes (Signed)
Date:  12/30/2016   Name:  Ann Chaney   DOB:  September 04, 1945   MRN:  [id number]  PCP:  Adline Potter, MD    Chief Complaint: Diabetes   History of Present Illness:  This is a 71 y.o. female seen for 3 month f/u. No new concerns. Still smoking but has cut back. Not really exercising much. Insomnia improved on gabapentin.  Review of Systems:  Review of Systems  Constitutional: Negative for chills and fever.  Respiratory: Negative for cough and shortness of breath.   Cardiovascular: Negative for chest pain and leg swelling.  Endocrine: Negative for polydipsia and polyuria.  Genitourinary: Negative for difficulty urinating.  Neurological: Negative for syncope and light-headedness.    Patient Active Problem List   Diagnosis Date Noted  . Microcytosis 11/17/2016  . Depression, major, single episode, complete remission ([place]) 11/17/2016  . Insomnia 07/22/2016  . Hyperlipidemia 07/22/2016  . Diabetes mellitus type 2, controlled, without complications ([place]) 89/21/1941  . Dental abscess 08/27/2015  . Smoker 11/04/2013  . Goiter 01/26/2012  . Hypertension, benign 12/17/2009    Prior to Admission medications   Medication Sig Start Date End Date Taking? Authorizing Provider  aspirin EC 81 MG tablet Take 81 mg by mouth.   Yes [provider]  gabapentin (NEURONTIN) 100 MG capsule Take 1 capsule (100 mg total) by mouth at bedtime. 07/22/16  Yes Karin Griffith, Gwyndolyn Saxon, MD  latanoprost (XALATAN) 0.005 % ophthalmic solution 1 drop at bedtime.   Yes [provider]  lisinopril (PRINIVIL,ZESTRIL) 40 MG tablet Take 40 mg by mouth. 01/24/16  Yes [provider]  metFORMIN (GLUCOPHAGE) 500 MG tablet Take 1,000 mg by mouth 2 (two) times daily with a meal.  01/24/16  Yes [provider]  Multiple Vitamin (MULTIVITAMIN) capsule Take 1 capsule by mouth daily.   Yes [provider]    Allergies  Allergen Reactions  . Atorvastatin Other (See Comments)    Myalgias   . Pravastatin Sodium Nausea Only    Past Surgical History:  Procedure Laterality Date  . HYSTEROTOMY      Social History  Substance Use Topics  . Smoking status: Current Every Day Smoker    Packs/day: 0.50    Types: Cigarettes  . Smokeless tobacco: Never Used  . Alcohol use No    Family History  Problem Relation Age of Onset  . Cancer Mother   . Emphysema Mother   . Diabetes Mother   . Glaucoma Father   . Dementia Father     Medication list has been reviewed and updated.  Physical Examination: BP 118/78   Pulse 98   Resp 16   Ht 5\' 2"  (1.575 m)   Wt 137 lb (62.1 kg)   SpO2 100%   BMI 25.06 kg/m   Physical Exam  Constitutional: She appears well-developed and well-nourished.  Cardiovascular: Normal rate, regular rhythm and normal heart sounds.   Pulmonary/Chest: Effort normal and breath sounds normal.  Musculoskeletal: She exhibits no edema.  Neurological: She is alert.  Skin: Skin is warm and dry.  Psychiatric: She has a normal mood and affect. Her behavior is normal.  Nursing note and vitals reviewed.   Assessment and Plan:  1. Controlled type 2 diabetes mellitus without complication, without long-term current use of insulin ([place]) Well controlled on metformin/asa - HgB A1c  2. Hypertension, benign Well controlled on lisinopril  3. Smoker Strongly encouraged cessation - Nurse to provide smoking / tobacco cessation education  4. Insomnia, unspecified type Well  controlled on gabapentin  5. Hyperlipidemia, unspecified hyperlipidemia type Intolerant statins  6. Need for influenza vaccination - Flu vaccine HIGH DOSE PF (Fluzone High dose)  7. HM Cologuard negative, zoster imm too expensive per pt, consider hep C screen next visit  Return in about 3 months (around 04/01/2017).  [name], [facility]  12/30/2016

## 2016-12-31 LAB — HEMOGLOBIN A1C
ESTIMATED AVERAGE GLUCOSE: 151 mg/dL
HEMOGLOBIN A1C: 6.9 % — AB (ref 4.8–5.6)

## 2017-01-07 DIAGNOSIS — H401131 Primary open-angle glaucoma, bilateral, mild stage: Secondary | ICD-10-CM | POA: Diagnosis not present

## 2017-01-19 ENCOUNTER — Other Ambulatory Visit: Payer: Self-pay | Admitting: Family Medicine

## 2017-01-19 ENCOUNTER — Telehealth: Payer: Self-pay

## 2017-01-19 MED ORDER — METFORMIN HCL 1000 MG PO TABS: 1000.0000 mg | ORAL_TABLET | Freq: Two times a day (BID) | ORAL | 3 refills | Status: DC

## 2017-01-19 MED ORDER — LISINOPRIL 40 MG PO TABS: 40.0000 mg | ORAL_TABLET | Freq: Every day | ORAL | 3 refills | Status: DC

## 2017-01-19 NOTE — Telephone Encounter (Signed)
Done

## 2017-01-19 NOTE — Telephone Encounter (Signed)
Patient needs refill on Lisinopril sent to [facility] and Metformin.

## 2017-04-01 ENCOUNTER — Other Ambulatory Visit: Payer: Self-pay

## 2017-04-01 ENCOUNTER — Ambulatory Visit (INDEPENDENT_AMBULATORY_CARE_PROVIDER_SITE_OTHER): Payer: Medicare HMO | Admitting: Family Medicine

## 2017-04-01 ENCOUNTER — Encounter: Payer: Self-pay | Admitting: Family Medicine

## 2017-04-01 VITALS — BP 128/76 | HR 98 | Resp 16 | Ht 62.0 in | Wt 141.8 lb

## 2017-04-01 DIAGNOSIS — I10 Essential (primary) hypertension: Secondary | ICD-10-CM | POA: Diagnosis not present

## 2017-04-01 DIAGNOSIS — Z0000 Encounter for general adult medical examination without abnormal findings: Secondary | ICD-10-CM

## 2017-04-01 DIAGNOSIS — E119 Type 2 diabetes mellitus without complications: Secondary | ICD-10-CM

## 2017-04-01 DIAGNOSIS — F17200 Nicotine dependence, unspecified, uncomplicated: Secondary | ICD-10-CM | POA: Diagnosis not present

## 2017-04-01 DIAGNOSIS — G4700 Insomnia, unspecified: Secondary | ICD-10-CM | POA: Diagnosis not present

## 2017-04-01 DIAGNOSIS — E785 Hyperlipidemia, unspecified: Secondary | ICD-10-CM | POA: Diagnosis not present

## 2017-04-01 NOTE — Progress Notes (Signed)
Date:  04/01/2017   Name:  Ann Chaney   DOB:  Feb 27, 1946   MRN:  [id number]  PCP:  Adline Potter, MD    Chief Complaint: Diabetes (BS 100-125 )   History of Present Illness:  This is a 72 y.o. female seen for 3 month f/u. T2DM on controlled on metformin, HTN on lisinopril, insomnia on gabapentin. Weight up 4#, not exercising much. Still smoking 2-3 cigs per day.  Review of Systems:  Review of Systems  Constitutional: Negative for chills and fever.  Respiratory: Negative for cough and shortness of breath.   Cardiovascular: Negative for chest pain and leg swelling.  Neurological: Negative for syncope and light-headedness.    Patient Active Problem List   Diagnosis Date Noted  . Microcytosis 11/17/2016  . Depression, major, single episode, complete remission ([place]) 11/17/2016  . Insomnia 07/22/2016  . Hyperlipidemia 07/22/2016  . Diabetes mellitus type 2, controlled, without complications ([place]) 93/90/3009  . Dental abscess 08/27/2015  . Smoker 11/04/2013  . Goiter 01/26/2012  . Hypertension, benign 12/17/2009    Prior to Admission medications   Medication Sig Start Date End Date Taking? Authorizing Provider  aspirin EC 81 MG tablet Take 81 mg by mouth.   Yes [provider]  gabapentin (NEURONTIN) 100 MG capsule Take 1 capsule (100 mg total) by mouth at bedtime. 07/22/16  Yes Gissela Bloch, Gwyndolyn Saxon, MD  latanoprost (XALATAN) 0.005 % ophthalmic solution 1 drop at bedtime.   Yes [provider]  lisinopril (PRINIVIL,ZESTRIL) 40 MG tablet Take 1 tablet (40 mg total) by mouth daily. 01/19/17  Yes Kamera Dubas, Gwyndolyn Saxon, MD  metFORMIN (GLUCOPHAGE) 1000 MG tablet Take 1 tablet (1,000 mg total) by mouth 2 (two) times daily. 01/19/17  Yes Azaela Caracci, Gwyndolyn Saxon, MD  Multiple Vitamin (MULTIVITAMIN) capsule Take 1 capsule by mouth daily.   Yes [provider]    Allergies  Allergen Reactions  . Atorvastatin Other (See Comments)    Myalgias  . Pravastatin Sodium Nausea Only     Past Surgical History:  Procedure Laterality Date  . HYSTEROTOMY      Social History   Tobacco Use  . Smoking status: Current Every Day Smoker    Packs/day: 0.50    Types: Cigarettes  . Smokeless tobacco: Never Used  Substance Use Topics  . Alcohol use: No  . Drug use: No    Family History  Problem Relation Age of Onset  . Cancer Mother   . Emphysema Mother   . Diabetes Mother   . Glaucoma Father   . Dementia Father     Medication list has been reviewed and updated.  Physical Examination: BP 128/76   Pulse 98   Resp 16   Ht 5\' 2"  (1.575 m)   Wt 141 lb 12.8 oz (64.3 kg)   SpO2 99%   BMI 25.94 kg/m   Physical Exam  Constitutional: She appears well-developed and well-nourished.  Cardiovascular: Normal rate, regular rhythm and normal heart sounds.  Pulmonary/Chest: Effort normal and breath sounds normal.  Musculoskeletal: She exhibits no edema.  Neurological: She is alert.  Skin: Skin is warm and dry.  Psychiatric: She has a normal mood and affect. Her behavior is normal.  Nursing note and vitals reviewed.   Assessment and Plan:  1. Controlled type 2 diabetes mellitus without complication, without long-term current use of insulin ([place]) Well controlled metformin, MCR ok in April, consider d/c asa - HgB A1c  2. Hypertension, benign Well controlled on lisinopril  3. Hyperlipidemia, unspecified hyperlipidemia type  Intolerant multiple statins  4. Smoker Strongly encouraged cessation  5. Insomnia, unspecified type Well controlled on gabapentin  6. Healthcare maintenance - Hepatitis C Antibody  Return in about 3 months (around 06/30/2017).  Satira Anis. [facility]  04/02/2017

## 2017-04-02 ENCOUNTER — Other Ambulatory Visit: Payer: Self-pay | Admitting: Family Medicine

## 2017-04-02 LAB — HEMOGLOBIN A1C
ESTIMATED AVERAGE GLUCOSE: 171 mg/dL
Hgb A1c MFr Bld: 7.6 % — ABNORMAL HIGH (ref 4.8–5.6)

## 2017-04-02 LAB — HEPATITIS C ANTIBODY

## 2017-04-14 ENCOUNTER — Telehealth: Payer: Self-pay

## 2017-04-14 NOTE — Telephone Encounter (Signed)
No indication aspirin beneficial in this setting but if she wants to continue taking 81 mg daily ok with me.

## 2017-04-14 NOTE — Telephone Encounter (Signed)
Patient left rather angry message demanding a call as to why the doctor has discontinued her 85 mg aspirin. She states she has been taking 85 mg aspirin for long time and to leave a message ASAP. I called and LM stating that we went over the labs and discussed that aspirin 81 mg not 85 mg is not something she needs as there is no indication for why she needs to take it. I also stated that this is a recommendation and that we told her she MAY stop it. I told her we try to minimize OTC and RX that are not needed. I said that this is OTC  And was not D/C it was simply a recommendation.

## 2017-07-01 ENCOUNTER — Encounter: Payer: Self-pay | Admitting: Family Medicine

## 2017-07-01 ENCOUNTER — Ambulatory Visit (INDEPENDENT_AMBULATORY_CARE_PROVIDER_SITE_OTHER): Payer: Medicare HMO | Admitting: Family Medicine

## 2017-07-01 VITALS — BP 136/86 | HR 84 | Ht 62.0 in | Wt 140.0 lb

## 2017-07-01 DIAGNOSIS — E119 Type 2 diabetes mellitus without complications: Secondary | ICD-10-CM

## 2017-07-01 DIAGNOSIS — F17200 Nicotine dependence, unspecified, uncomplicated: Secondary | ICD-10-CM

## 2017-07-01 DIAGNOSIS — I10 Essential (primary) hypertension: Secondary | ICD-10-CM | POA: Diagnosis not present

## 2017-07-01 DIAGNOSIS — E785 Hyperlipidemia, unspecified: Secondary | ICD-10-CM | POA: Diagnosis not present

## 2017-07-02 ENCOUNTER — Other Ambulatory Visit: Payer: Self-pay | Admitting: Family Medicine

## 2017-07-02 LAB — COMPREHENSIVE METABOLIC PANEL WITH GFR
ALT: 15 IU/L (ref 0–32)
AST: 15 IU/L (ref 0–40)
Albumin/Globulin Ratio: 1.7 (ref 1.2–2.2)
Albumin: 4.7 g/dL (ref 3.5–4.8)
Alkaline Phosphatase: 98 IU/L (ref 39–117)
BUN/Creatinine Ratio: 11 — ABNORMAL LOW (ref 12–28)
BUN: 9 mg/dL (ref 8–27)
Bilirubin Total: 0.2 mg/dL (ref 0.0–1.2)
CO2: 25 mmol/L (ref 20–29)
Calcium: 10 mg/dL (ref 8.7–10.3)
Chloride: 101 mmol/L (ref 96–106)
Creatinine, Ser: 0.83 mg/dL (ref 0.57–1.00)
GFR calc Af Amer: 83 mL/min/1.73
GFR calc non Af Amer: 72 mL/min/1.73
Globulin, Total: 2.7 g/dL (ref 1.5–4.5)
Glucose: 262 mg/dL — ABNORMAL HIGH (ref 65–99)
Potassium: 4.4 mmol/L (ref 3.5–5.2)
Sodium: 141 mmol/L (ref 134–144)
Total Protein: 7.4 g/dL (ref 6.0–8.5)

## 2017-07-02 LAB — CBC
Hematocrit: 40.8 % (ref 34.0–46.6)
Hemoglobin: 13.1 g/dL (ref 11.1–15.9)
MCH: 23.9 pg — ABNORMAL LOW (ref 26.6–33.0)
MCHC: 32.1 g/dL (ref 31.5–35.7)
MCV: 74 fL — ABNORMAL LOW (ref 79–97)
Platelets: 374 10*3/uL (ref 150–379)
RBC: 5.49 x10E6/uL — AB (ref 3.77–5.28)
RDW: 15.5 % — AB (ref 12.3–15.4)
WBC: 7.4 10*3/uL (ref 3.4–10.8)

## 2017-07-02 LAB — HEMOGLOBIN A1C
Est. average glucose Bld gHb Est-mCnc: 217 mg/dL
Hgb A1c MFr Bld: 9.2 % — ABNORMAL HIGH (ref 4.8–5.6)

## 2017-07-02 LAB — MICROALBUMIN / CREATININE URINE RATIO
Creatinine, Urine: 44.2 mg/dL
Microalb/Creat Ratio: 9.3 mg/g{creat} (ref 0.0–30.0)
Microalbumin, Urine: 4.1 ug/mL

## 2017-07-02 LAB — TSH: TSH: 0.763 u[IU]/mL (ref 0.450–4.500)

## 2017-07-02 MED ORDER — EMPAGLIFLOZIN 10 MG PO TABS: 10.0000 mg | ORAL_TABLET | Freq: Every day | ORAL | 2 refills | Status: DC

## 2017-07-02 NOTE — Progress Notes (Signed)
Date:  07/01/2017   Name:  Ann Chaney   DOB:  1945/05/25   MRN:  [id number]  PCP:  Adline Potter, MD    Chief Complaint: Diabetes (3 month follow up)   History of Present Illness:  This is a 72 y.o. female seen for three month f/u. On increased metformin due to elevated a1c last visit, off asa and gabapentin, insomnia ok.. Still smoking but has cut back.  Review of Systems:  Review of Systems  Constitutional: Negative for chills and fever.  Respiratory: Negative for cough and shortness of breath.   Cardiovascular: Negative for chest pain and leg swelling.  Genitourinary: Negative for difficulty urinating.  Neurological: Negative for syncope and light-headedness.    Patient Active Problem List   Diagnosis Date Noted  . Microcytosis 11/17/2016  . Depression, major, single episode, complete remission ([place]) 11/17/2016  . Insomnia 07/22/2016  . Hyperlipidemia 07/22/2016  . Diabetes mellitus type 2, controlled, without complications ([place]) 45/80/9983  . Dental abscess 08/27/2015  . Smoker 11/04/2013  . Goiter 01/26/2012  . Hypertension, benign 12/17/2009    Prior to Admission medications   Medication Sig Start Date End Date Taking? Authorizing Provider  latanoprost (XALATAN) 0.005 % ophthalmic solution 1 drop at bedtime.   Yes [provider]  lisinopril (PRINIVIL,ZESTRIL) 40 MG tablet Take 1 tablet (40 mg total) by mouth daily. 01/19/17  Yes Palmer Fahrner, Gwyndolyn Saxon, MD  metFORMIN (GLUCOPHAGE) 1000 MG tablet Take 1 tablet (1,000 mg total) by mouth 2 (two) times daily. 01/19/17  Yes Gianlucca Szymborski, Gwyndolyn Saxon, MD  Multiple Vitamin (MULTIVITAMIN) capsule Take 1 capsule by mouth daily.   Yes [provider]    Allergies  Allergen Reactions  . Atorvastatin Other (See Comments)    Myalgias  . Pravastatin Sodium Nausea Only    Past Surgical History:  Procedure Laterality Date  . HYSTEROTOMY      Social History   Tobacco Use  . Smoking status: Current Every Day Smoker    Packs/day: 0.50    Types: Cigarettes  . Smokeless tobacco: Never Used  Substance Use Topics  . Alcohol use: No  . Drug use: No    Family History  Problem Relation Age of Onset  . Cancer Mother   . Emphysema Mother   . Diabetes Mother   . Glaucoma Father   . Dementia Father     Medication list has been reviewed and updated.  Physical Examination: BP 136/86   Pulse 84   Ht 5\' 2"  (1.575 m)   Wt 140 lb (63.5 kg)   BMI 25.61 kg/m   Physical Exam  Constitutional: She appears well-developed and well-nourished.  Cardiovascular: Normal rate, regular rhythm and normal heart sounds.  Pulmonary/Chest: Effort normal and breath sounds normal.  Musculoskeletal: She exhibits no edema.  Neurological: She is alert.  Skin: Skin is warm and dry.  Psychiatric: She has a normal mood and affect. Her behavior is normal.  Nursing note and vitals reviewed.   Assessment and Plan:  1. Controlled type 2 diabetes mellitus without complication, without long-term current use of insulin (HCC) On increased metformin - HgB A1c - Urine Microalbumin w/creat. ratio - TSH  2. Hypertension, benign Adequate control on lisinopril - Comprehensive Metabolic Panel (CMET) - CBC  3. Hyperlipidemia, unspecified hyperlipidemia type Intolerant statins  4. Smoker Strongly encouraged cessation  Return in about 3 months (around 09/30/2017).  Satira Anis. [facility]  07/02/2017

## 2017-08-04 DIAGNOSIS — H401131 Primary open-angle glaucoma, bilateral, mild stage: Secondary | ICD-10-CM | POA: Diagnosis not present

## 2017-08-04 DIAGNOSIS — E133293 Other specified diabetes mellitus with mild nonproliferative diabetic retinopathy without macular edema, bilateral: Secondary | ICD-10-CM | POA: Diagnosis not present

## 2017-08-04 LAB — HM DIABETES EYE EXAM

## 2017-09-02 ENCOUNTER — Telehealth: Payer: Self-pay | Admitting: Family Medicine

## 2017-09-02 NOTE — Telephone Encounter (Signed)
Called to schedule Medicare Annual Wellness Visit with Nurse Health Advisor. If patient returns call, please schedule AWV with NHA any date  ° °Thank you! °For any questions please contact: °Kathryn Brown °[phone]  °Skype [email or web address]  ° °

## 2017-09-04 ENCOUNTER — Other Ambulatory Visit: Payer: Self-pay

## 2017-10-01 ENCOUNTER — Ambulatory Visit (INDEPENDENT_AMBULATORY_CARE_PROVIDER_SITE_OTHER): Payer: Medicare HMO | Admitting: Family Medicine

## 2017-10-01 ENCOUNTER — Encounter: Payer: Self-pay | Admitting: Family Medicine

## 2017-10-01 VITALS — BP 117/77 | HR 87 | Resp 16 | Ht 62.0 in | Wt 137.0 lb

## 2017-10-01 DIAGNOSIS — F17200 Nicotine dependence, unspecified, uncomplicated: Secondary | ICD-10-CM

## 2017-10-01 DIAGNOSIS — E119 Type 2 diabetes mellitus without complications: Secondary | ICD-10-CM

## 2017-10-01 DIAGNOSIS — I10 Essential (primary) hypertension: Secondary | ICD-10-CM

## 2017-10-01 DIAGNOSIS — E785 Hyperlipidemia, unspecified: Secondary | ICD-10-CM

## 2017-10-01 NOTE — Assessment & Plan Note (Signed)
Controlled cont lisinopril 40 mg daily

## 2017-10-01 NOTE — Progress Notes (Signed)
Name: Ann Chaney   MRN: [id number]    DOB: August 30, 1945   Date:10/01/2017       Progress Note  Subjective  Chief Complaint  Chief Complaint  Patient presents with  . Diabetes    Diabetes  She presents for her follow-up diabetic visit. She has type 2 diabetes mellitus. Her disease course has been stable. There are no hypoglycemic associated symptoms. Pertinent negatives for hypoglycemia include no dizziness, headaches or nervousness/anxiousness. Pertinent negatives for diabetes include no blurred vision, no chest pain, no fatigue, no foot paresthesias, no foot ulcerations, no polydipsia, no polyphagia, no polyuria, no visual change, no weakness and no weight loss. There are no hypoglycemic complications. Symptoms are stable. There are no diabetic complications. Risk factors for coronary artery disease include diabetes mellitus, dyslipidemia and hypertension. Current diabetic treatment includes oral agent (monotherapy). She is compliant with treatment all of the time. She is following a generally healthy diet. She participates in exercise intermittently. There is no change in her home blood glucose trend. Her breakfast blood glucose is taken between 8-9 am. Her breakfast blood glucose range is generally 130-140 mg/dl. She does not see a podiatrist.Eye exam is current.    Hypertension, benign Controlled cont lisinopril 40 mg daily   Past Medical History:  Diagnosis Date  . Glaucoma   . Hypertension     Past Surgical History:  Procedure Laterality Date  . HYSTEROTOMY      Family History  Problem Relation Age of Onset  . Cancer Mother   . Emphysema Mother   . Diabetes Mother   . Glaucoma Father   . Dementia Father     Social History   Socioeconomic History  . Marital status: Divorced    Spouse name: Not on file  . Number of children: Not on file  . Years of education: Not on file  . Highest education level: Not on file  Occupational History  . Not on file  Social Needs   . Financial resource strain: Not on file  . Food insecurity:    Worry: Not on file    Inability: Not on file  . Transportation needs:    Medical: Not on file    Non-medical: Not on file  Tobacco Use  . Smoking status: Current Every Day Smoker    Packs/day: 0.50    Types: Cigarettes  . Smokeless tobacco: Never Used  Substance and Sexual Activity  . Alcohol use: No  . Drug use: No  . Sexual activity: Not on file  Lifestyle  . Physical activity:    Days per week: Not on file    Minutes per session: Not on file  . Stress: Not on file  Relationships  . Social connections:    Talks on phone: Not on file    Gets together: Not on file    Attends religious service: Not on file    Active member of club or organization: Not on file    Attends meetings of clubs or organizations: Not on file    Relationship status: Not on file  . Intimate partner violence:    Fear of current or ex partner: Not on file    Emotionally abused: Not on file    Physically abused: Not on file    Forced sexual activity: Not on file  Other Topics Concern  . Not on file  Social History Narrative  . Not on file    Allergies  Allergen Reactions  . Atorvastatin Other (See Comments)  Myalgias  . Pravastatin Sodium Nausea Only    Outpatient Medications Prior to Visit  Medication Sig Dispense Refill  . latanoprost (XALATAN) 0.005 % ophthalmic solution 1 drop at bedtime.    Marland Kitchen lisinopril (PRINIVIL,ZESTRIL) 40 MG tablet Take 1 tablet (40 mg total) by mouth daily. 90 tablet 3  . metFORMIN (GLUCOPHAGE) 1000 MG tablet Take 1 tablet (1,000 mg total) by mouth 2 (two) times daily. 180 tablet 3  . Multiple Vitamin (MULTIVITAMIN) capsule Take 1 capsule by mouth daily.    . empagliflozin (JARDIANCE) 10 MG TABS tablet Take 10 mg by mouth daily. 30 tablet 2   No facility-administered medications prior to visit.     Review of Systems  Constitutional: Negative for chills, fatigue, fever, malaise/fatigue and weight  loss.  HENT: Negative for ear discharge, ear pain and sore throat.   Eyes: Negative for blurred vision.  Respiratory: Negative for cough, sputum production, shortness of breath and wheezing.   Cardiovascular: Negative for chest pain, palpitations and leg swelling.  Gastrointestinal: Negative for abdominal pain, blood in stool, constipation, diarrhea, heartburn, melena and nausea.  Genitourinary: Negative for dysuria, frequency, hematuria and urgency.  Musculoskeletal: Negative for back pain, joint pain, myalgias and neck pain.  Skin: Negative for rash.  Neurological: Negative for dizziness, tingling, sensory change, focal weakness, weakness and headaches.  Endo/Heme/Allergies: Negative for environmental allergies, polydipsia and polyphagia. Does not bruise/bleed easily.  Psychiatric/Behavioral: Negative for depression and suicidal ideas. The patient is not nervous/anxious and does not have insomnia.      Objective  Vitals:   10/01/17 1423  BP: 117/77  Pulse: 87  Resp: 16  SpO2: 100%  Weight: 137 lb (62.1 kg)  Height: 5\' 2"  (1.575 m)    Physical Exam  Constitutional: No distress.  HENT:  Head: Normocephalic and atraumatic.  Right Ear: External ear normal.  Left Ear: External ear normal.  Nose: Nose normal.  Mouth/Throat: Oropharynx is clear and moist.  Eyes: Pupils are equal, round, and reactive to light. Conjunctivae and EOM are normal. Right eye exhibits no discharge. Left eye exhibits no discharge.  Neck: Normal range of motion. Neck supple. No JVD present. No thyromegaly present.  Cardiovascular: Normal rate, regular rhythm, normal heart sounds and intact distal pulses. Exam reveals no gallop and no friction rub.  No murmur heard. Pulmonary/Chest: Effort normal and breath sounds normal.  Abdominal: Soft. Bowel sounds are normal. She exhibits no mass. There is no tenderness. There is no guarding.  Musculoskeletal: Normal range of motion. She exhibits no edema.   Lymphadenopathy:    She has no cervical adenopathy.  Neurological: She is alert. She has normal reflexes.  Skin: Skin is warm and dry. She is not diaphoretic.  Nursing note and vitals reviewed. Patient has been advised of the health risks of smoking and counseled concerning cessation of tobacco products. I spent over 3 minutes for discussion and to answer questions.    Assessment & Plan  Problem List Items Addressed This Visit      Cardiovascular and Mediastinum   Hypertension, benign    Controlled cont lisinopril 40 mg daily        Endocrine   Diabetes mellitus type 2, controlled, without complications ([place]) - Primary   Relevant Orders   Hemoglobin A1c     Other   Smoker   Hyperlipidemia   Relevant Orders   Lipid panel      No orders of the defined types were placed in this encounter.  Dr. [facility]  10/01/17

## 2017-10-02 LAB — LIPID PANEL
Chol/HDL Ratio: 6 ratio — ABNORMAL HIGH (ref 0.0–4.4)
Cholesterol, Total: 253 mg/dL — ABNORMAL HIGH (ref 100–199)
HDL: 42 mg/dL (ref >39)
LDL Calculated: 178 mg/dL — ABNORMAL HIGH (ref 0–99)
TRIGLYCERIDES: 164 mg/dL — AB (ref 0–149)
VLDL Cholesterol Cal: 33 mg/dL (ref 5–40)

## 2017-10-02 LAB — HEMOGLOBIN A1C
Est. average glucose Bld gHb Est-mCnc: 223 mg/dL
HEMOGLOBIN A1C: 9.4 % — AB (ref 4.8–5.6)

## 2017-10-05 ENCOUNTER — Other Ambulatory Visit: Payer: Self-pay

## 2017-10-05 DIAGNOSIS — E0865 Diabetes mellitus due to underlying condition with hyperglycemia: Secondary | ICD-10-CM

## 2017-10-05 MED ORDER — GLIPIZIDE 5 MG PO TABS: 5.0000 mg | ORAL_TABLET | Freq: Two times a day (BID) | ORAL | 1 refills | Status: DC

## 2017-10-29 DIAGNOSIS — F17200 Nicotine dependence, unspecified, uncomplicated: Secondary | ICD-10-CM | POA: Diagnosis not present

## 2017-10-29 DIAGNOSIS — E049 Nontoxic goiter, unspecified: Secondary | ICD-10-CM | POA: Diagnosis not present

## 2017-10-29 DIAGNOSIS — E1169 Type 2 diabetes mellitus with other specified complication: Secondary | ICD-10-CM | POA: Diagnosis not present

## 2017-10-29 DIAGNOSIS — E1165 Type 2 diabetes mellitus with hyperglycemia: Secondary | ICD-10-CM | POA: Diagnosis not present

## 2017-10-29 DIAGNOSIS — E1159 Type 2 diabetes mellitus with other circulatory complications: Secondary | ICD-10-CM | POA: Diagnosis not present

## 2017-10-29 DIAGNOSIS — I10 Essential (primary) hypertension: Secondary | ICD-10-CM | POA: Diagnosis not present

## 2017-10-29 DIAGNOSIS — E782 Mixed hyperlipidemia: Secondary | ICD-10-CM | POA: Diagnosis not present

## 2017-11-10 DIAGNOSIS — F17200 Nicotine dependence, unspecified, uncomplicated: Secondary | ICD-10-CM | POA: Insufficient documentation

## 2017-11-10 DIAGNOSIS — E1169 Type 2 diabetes mellitus with other specified complication: Secondary | ICD-10-CM | POA: Insufficient documentation

## 2017-11-10 DIAGNOSIS — E782 Mixed hyperlipidemia: Secondary | ICD-10-CM

## 2018-01-28 ENCOUNTER — Other Ambulatory Visit: Payer: Self-pay | Admitting: Family Medicine

## 2018-01-29 ENCOUNTER — Other Ambulatory Visit: Payer: Self-pay | Admitting: Family Medicine

## 2018-01-29 ENCOUNTER — Other Ambulatory Visit: Payer: Self-pay

## 2018-01-29 DIAGNOSIS — E0865 Diabetes mellitus due to underlying condition with hyperglycemia: Secondary | ICD-10-CM

## 2018-01-29 MED ORDER — METFORMIN HCL 1000 MG PO TABS: 1000.0000 mg | ORAL_TABLET | Freq: Two times a day (BID) | ORAL | 0 refills | Status: DC

## 2018-02-02 DIAGNOSIS — H401131 Primary open-angle glaucoma, bilateral, mild stage: Secondary | ICD-10-CM | POA: Diagnosis not present

## 2018-02-02 DIAGNOSIS — E133293 Other specified diabetes mellitus with mild nonproliferative diabetic retinopathy without macular edema, bilateral: Secondary | ICD-10-CM | POA: Diagnosis not present

## 2018-02-03 DIAGNOSIS — E049 Nontoxic goiter, unspecified: Secondary | ICD-10-CM | POA: Diagnosis not present

## 2018-03-09 ENCOUNTER — Telehealth: Payer: Self-pay | Admitting: Family Medicine

## 2018-03-09 NOTE — Telephone Encounter (Signed)
Called to schedule AWV-I.  Eligible as of 11/22/12, per [facility].  No Answer; L/M to call Lattie Haw at [phone]. lec

## 2018-03-12 NOTE — Telephone Encounter (Signed)
Returned call to pt. And scheduled AWV-I for 03/29/18 at 2:40 pm. lec

## 2018-03-29 ENCOUNTER — Ambulatory Visit (INDEPENDENT_AMBULATORY_CARE_PROVIDER_SITE_OTHER): Payer: Medicare HMO

## 2018-03-29 VITALS — BP 140/70 | HR 92 | Temp 98.0°F | Resp 16 | Ht 62.0 in | Wt 138.0 lb

## 2018-03-29 DIAGNOSIS — Z0000 Encounter for general adult medical examination without abnormal findings: Secondary | ICD-10-CM

## 2018-03-29 DIAGNOSIS — Z23 Encounter for immunization: Secondary | ICD-10-CM | POA: Diagnosis not present

## 2018-03-29 DIAGNOSIS — Z1231 Encounter for screening mammogram for malignant neoplasm of breast: Secondary | ICD-10-CM

## 2018-03-29 NOTE — Progress Notes (Signed)
Subjective:   Ann Chaney is a 73 y.o. female who presents for an Initial Medicare Annual Wellness Visit.  Review of Systems     Cardiac Risk Factors include: advanced age (>50men, >42 women);diabetes mellitus;dyslipidemia;hypertension;smoking/ tobacco exposure     Objective:    Today's Vitals   03/29/18 1501  BP: 140/70  Pulse: 92  Resp: 16  Temp: 98 F (36.7 C)  TempSrc: Oral  SpO2: 98%  Weight: 138 lb (62.6 kg)  Height: 5\' 2"  (1.575 m)   Body mass index is 25.24 kg/m.  Advanced Directives 03/29/2018 12/30/2016 09/29/2016  Does Patient Have a Medical Advance Directive? No No No  Would patient like information on creating a medical advance directive? Yes (MAU/Ambulatory/Procedural Areas - Information given) - -    Current Medications (verified) Outpatient Encounter Medications as of 03/29/2018  Medication Sig  . glipiZIDE (GLUCOTROL) 5 MG tablet Take 1 tablet (5 mg total) by mouth 2 (two) times daily before a meal.  . latanoprost (XALATAN) 0.005 % ophthalmic solution 1 drop at bedtime.  Marland Kitchen lisinopril (PRINIVIL,ZESTRIL) 40 MG tablet TAKE 1 TABLET BY MOUTH ONCE DAILY  . metFORMIN (GLUCOPHAGE) 1000 MG tablet Take 1 tablet (1,000 mg total) by mouth 2 (two) times daily.  . Multiple Vitamin (MULTIVITAMIN) capsule Take 1 capsule by mouth daily.   No facility-administered encounter medications on file as of 03/29/2018.     Allergies (verified) Atorvastatin and Pravastatin sodium   History: Past Medical History:  Diagnosis Date  . Diabetes mellitus without complication ([place])   . Glaucoma   . Glaucoma   . Hyperlipidemia   . Hypertension    Past Surgical History:  Procedure Laterality Date  . ABDOMINAL HYSTERECTOMY    . HYSTEROTOMY     Family History  Problem Relation Age of Onset  . Cancer Mother        sternum  . Emphysema Mother   . Diabetes Mother   . Glaucoma Father   . Dementia Father    Social History   Socioeconomic History  . Marital status:  Divorced    Spouse name: Not on file  . Number of children: 1  . Years of education: Not on file  . Highest education level: Associate degree: academic program  Occupational History  . Occupation: retired  Scientific laboratory technician  . Financial resource strain: Not very hard  . Food insecurity:    Worry: Never true    Inability: Never true  . Transportation needs:    Medical: No    Non-medical: No  Tobacco Use  . Smoking status: Current Every Day Smoker    Packs/day: 0.50    Types: Cigarettes  . Smokeless tobacco: Never Used  Substance and Sexual Activity  . Alcohol use: No  . Drug use: No  . Sexual activity: Not Currently  Lifestyle  . Physical activity:    Days per week: 0 days    Minutes per session: 0 min  . Stress: Not at all  Relationships  . Social connections:    Talks on phone: More than three times a week    Gets together: More than three times a week    Attends religious service: More than 4 times per year    Active member of club or organization: No    Attends meetings of clubs or organizations: Never    Relationship status: Divorced  Other Topics Concern  . Not on file  Social History Narrative  . Not on file    Tobacco  Counseling Ready to quit: No Counseling given: No   Clinical Intake:  Pre-visit preparation completed: Yes  Pain : No/denies pain     Nutritional Status: BMI 25 -29 Overweight Nutritional Risks: None Diabetes: Yes CBG done?: No Did pt. bring in CBG monitor from home?: No   Nutrition Risk Assessment:  Has the patient had any N/V/D within the last 2 months?  No  Does the patient have any non-healing wounds?  No  Has the patient had any unintentional weight loss or weight gain?  No   Diabetes:  Is the patient diabetic?  Yes  If diabetic, was a CBG obtained today?  No  Did the patient bring in their glucometer from home?  No  How often do you monitor your CBG's? Daily fasting in morning.   Financial Strains and Diabetes  Management:  Are you having any financial strains with the device, your supplies or your medication? No .  Does the patient want to be seen by Chronic Care Management for management of their diabetes?  No  Would the patient like to be referred to a Nutritionist or for Diabetic Management?  No   Diabetic Exams:  Diabetic Eye Exam: Completed 08/04/17 negative retinopathy.   Diabetic Foot Exam: Completed 10/01/17.   How often do you need to have someone help you when you read instructions, pamphlets, or other written materials from your doctor or pharmacy?: 1 - Never What is the last grade level you completed in school?: 2 years college  Interpreter Needed?: No  Information entered by :: Clemetine Marker LPN   Activities of Daily Living In your present state of health, do you have any difficulty performing the following activities: 03/29/2018 10/01/2017  Hearing? N N  Comment declines hearing aids -  Vision? N N  Comment wears reading glasses -  Difficulty concentrating or making decisions? N N  Walking or climbing stairs? N N  Dressing or bathing? N N  Doing errands, shopping? N N  Preparing Food and eating ? N -  Using the Toilet? N -  In the past six months, have you accidently leaked urine? N -  Do you have problems with loss of bowel control? N -  Managing your Medications? N -  Managing your Finances? N -  Housekeeping or managing your Housekeeping? N -  Some recent data might be hidden     Immunizations and Health Maintenance Immunization History  Administered Date(s) Administered  . Influenza, High Dose Seasonal PF 12/30/2016  . Influenza, Seasonal, Injecte, Preservative Fre 12/11/2011  . Influenza,inj,Quad PF,6+ Mos 12/13/2013, 02/01/2015, 01/24/2016  . Pneumococcal Conjugate-13 10/07/2013  . Pneumococcal Polysaccharide-23 10/24/2014  . Tdap 09/11/2009   Health Maintenance Due  Topic Date Due  . INFLUENZA VACCINE  10/22/2017  . MAMMOGRAM  12/22/2017    Patient  Care Team: Juline Patch, MD as PCP - General (Family Medicine)  Indicate any recent Medical Services you may have received from other than [facility] providers in the past year (date may be approximate).     Assessment:   This is a routine wellness examination for Ann Chaney.  Hearing/Vision screen Hearing Screening Comments: Pt has no difficulty hearing  Vision Screening Comments: Annual vision screenings done by Dr. Ellin Mayhew  Dietary issues and exercise activities discussed: Current Exercise Habits: The patient does not participate in regular exercise at present, Exercise limited by: None identified  Goals    . Increase physical activity     Recommend increasing physical activity to 150 minutes  per week.       Depression Screen PHQ 2/9 Scores 03/29/2018 07/01/2017 06/24/2016  PHQ - 2 Score 0 0 0  PHQ- 9 Score - 0 -    Fall Risk Fall Risk  03/29/2018 07/01/2017 06/24/2016  Falls in the past year? 0 No No  Number falls in past yr: 0 - -  Follow up Falls prevention discussed - -    FALL RISK PREVENTION PERTAINING TO THE HOME:  Any stairs in or around the home WITH handrails? No  Home free of loose throw rugs in walkways, pet beds, electrical cords, etc? Yes  Adequate lighting in your home to reduce risk of falls? Yes   ASSISTIVE DEVICES UTILIZED TO PREVENT FALLS:  Life alert? No  Use of a cane, walker or w/c? No  Grab bars in the bathroom? No  Shower chair or bench in shower? No  Elevated toilet seat or a handicapped toilet? No   DME ORDERS:  DME order needed?  No   TIMED UP AND GO:  Was the test performed? Yes .  Length of time to ambulate 10 feet: 5 sec.   GAIT:  Appearance of gait: Gait stead-fast and without the use of an assistive device.  Education: Fall risk prevention has been discussed.  Intervention(s) required? No   Cognitive Function:     6CIT Screen 03/29/2018  What Year? 0 points  What month? 0 points  What time? 0 points  Count back from 20 0 points    Months in reverse 2 points  Repeat phrase 0 points  Total Score 2    Screening Tests Health Maintenance  Topic Date Due  . INFLUENZA VACCINE  10/22/2017  . MAMMOGRAM  12/22/2017  . DEXA SCAN  03/30/2019 (Originally 12/10/2011)  . HEMOGLOBIN A1C  04/03/2018  . OPHTHALMOLOGY EXAM  08/05/2018  . FOOT EXAM  10/02/2018  . TETANUS/TDAP  09/12/2019  . COLONOSCOPY  03/24/2026  . Hepatitis C Screening  Completed  . PNA vac Low Risk Adult  Completed    Qualifies for Shingles Vaccine? Yes  . Due for Shingrix. Education has been provided regarding the importance of this vaccine. Pt has been advised to call insurance company to determine out of pocket expense. Advised may also receive vaccine at local pharmacy or Health Dept. Verbalized acceptance and understanding.  Tdap:Up to date  Flu Vaccine: Due for Flu vaccine. Does the patient want to receive this vaccine today?  Yes . Education has been provided regarding the importance of this vaccine but still declined. Advised may receive this vaccine at local pharmacy or Health Dept. Aware to provide a copy of the vaccination record if obtained from local pharmacy or Health Dept. Verbalized acceptance and understanding.  Pneumococcal Vaccine: Up to date   Cancer Screenings:  Colorectal Screening: Cologuard Completed 2018. Repeat every 3 years; due 2021  Mammogram: Completed 12/23/15. Repeat every year;Ordered today. Pt provided with contact information and advised to call to schedule appt.   Bone Density: pt declined DEXA scan  Lung Cancer Screening: (Low Dose CT Chest recommended if Age 53-80 years, 30 pack-year currently smoking OR have quit w/in 15years.) does not qualify.    Additional Screening:  Hepatitis C Screening: does qualify; Completed 04/01/17  Vision Screening: Recommended annual ophthalmology exams for early detection of glaucoma and other disorders of the eye. Is the patient up to date with their annual eye exam?  Yes  Who  is the provider or what is the name of the office  in which the pt attends annual eye exams? Dr. Ellin Mayhew  Dental Screening: Recommended annual dental exams for proper oral hygiene  Community Resource Referral:  CRR required this visit?  No      Plan:    I have personally reviewed and addressed the Medicare Annual Wellness questionnaire and have noted the following in the patient's chart:  A. Medical and social history B. Use of alcohol, tobacco or illicit drugs  C. Current medications and supplements D. Functional ability and status E.  Nutritional status F.  Physical activity G. Advance directives H. List of other physicians I.  Hospitalizations, surgeries, and ER visits in previous 12 months J.  Chance such as hearing and vision if needed, cognitive and depression L. Referrals and appointments   In addition, I have reviewed and discussed with patient certain preventive protocols, quality metrics, and best practice recommendations. A written personalized care plan for preventive services as well as general preventive health recommendations were provided to patient.   Signed,  Clemetine Marker, LPN Nurse Health Advisor   Nurse Notes: none

## 2018-03-29 NOTE — Patient Instructions (Signed)
Ann Chaney , Thank you for taking time to come for your Medicare Wellness Visit. I appreciate your ongoing commitment to your health goals. Please review the following plan we discussed and let me know if I can assist you in the future.   Screening recommendations/referrals: Colonoscopy: Cologuard completed 2018, repeat in 2021 Mammogram: done 12/23/15. Please call [phone] to schedule your mammogram.  Bone Density: postponed.  Recommended yearly ophthalmology/optometry visit for glaucoma screening and checkup Recommended yearly dental visit for hygiene and checkup  Vaccinations: Influenza vaccine: done today  Pneumococcal vaccine: done 10/24/14 Tdap vaccine: 09/11/09 Shingles vaccine: Shingrix discussed. Please contact your pharmacy for coverage information.     Advanced directives: Advance directive discussed with you today. I have provided a copy for you to complete at home and have notarized. Once this is complete please bring a copy in to our office so we can scan it into your chart.  Conditions/risks identified: Recommend increasing physical activity by joining silver sneakers to 150 minutes per week.   If you wish to quit smoking, help is available. For free tobacco cessation program offerings call the [facility] at (939)316-7982 or Live Well Line at 575-729-3791. You may also visit www.[place].com or email [email or web address] .com for more information on other programs.  Influenza (Flu) Vaccine (Inactivated or Recombinant): What You Need to Know 1. Why get vaccinated? Influenza vaccine can prevent influenza (flu). Flu is a contagious disease that spreads around the [place] every year, usually between October and May. Anyone can get the flu, but it is more dangerous for some people. Infants and young children, people 106 years of age and older, pregnant women, and people with certain health conditions or a weakened immune system are at greatest risk of flu  complications. Pneumonia, bronchitis, sinus infections and ear infections are examples of flu-related complications. If you have a medical condition, such as heart disease, cancer or diabetes, flu can make it worse. Flu can cause fever and chills, sore throat, muscle aches, fatigue, cough, headache, and runny or stuffy nose. Some people may have vomiting and diarrhea, though this is more common in children than adults. Each year thousands of people in the [place] die from flu, and many more are hospitalized. Flu vaccine prevents millions of illnesses and flu-related visits to the doctor each year. 2. Influenza vaccine CDC recommends everyone 66 months of age and older get vaccinated every flu season. Children 6 months through 22 years of age may need 2 doses during a single flu season. Everyone else needs only 1 dose each flu season. It takes about 2 weeks for protection to develop after vaccination. There are many flu viruses, and they are always changing. Each year a new flu vaccine is made to protect against three or four viruses that are likely to cause disease in the upcoming flu season. Even when the vaccine doesn't exactly match these viruses, it may still provide some protection. Influenza vaccine does not cause flu. Influenza vaccine may be given at the same time as other vaccines. 3. Talk with your health care provider Tell your vaccine provider if the person getting the vaccine: Has had an allergic reaction after a previous dose of influenza vaccine, or has any severe, life-threatening allergies. Has ever had Guillain-Barr Syndrome (also called GBS). In some cases, your health care provider may decide to postpone influenza vaccination to a future visit. People with minor illnesses, such as a cold, may be vaccinated. People who are moderately or severely  ill should usually wait until they recover before getting influenza vaccine. Your health care provider can give you more  information. 4. Risks of a vaccine reaction Soreness, redness, and swelling where shot is given, fever, muscle aches, and headache can happen after influenza vaccine. There may be a very small increased risk of Guillain-Barr Syndrome (GBS) after inactivated influenza vaccine (the flu shot). Young children who get the flu shot along with pneumococcal vaccine (PCV13), and/or DTaP vaccine at the same time might be slightly more likely to have a seizure caused by fever. Tell your health care provider if a child who is getting flu vaccine has ever had a seizure. People sometimes faint after medical procedures, including vaccination. Tell your provider if you feel dizzy or have vision changes or ringing in the ears. As with any medicine, there is a very remote chance of a vaccine causing a severe allergic reaction, other serious injury, or death. 5. What if there is a serious problem? An allergic reaction could occur after the vaccinated person leaves the clinic. If you see signs of a severe allergic reaction (hives, swelling of the face and throat, difficulty breathing, a fast heartbeat, dizziness, or weakness), call 9-1-1 and get the person to the nearest hospital. For other signs that concern you, call your health care provider. Adverse reactions should be reported to the Vaccine Adverse Event Reporting System (VAERS). Your health care provider will usually file this report, or you can do it yourself. Visit the VAERS website at www.vaers.SamedayNews.es or call 5053943580.VAERS is only for reporting reactions, and VAERS staff do not give medical advice. 6. The National Vaccine Injury Compensation Program The [employer] Vaccine Injury Compensation Program (VICP) is a federal program that was created to compensate people who may have been injured by certain vaccines. Visit the VICP website at GoldCloset.com.ee or call 272-373-0110 to learn about the program and about filing a claim. There is a  time limit to file a claim for compensation. 7. How can I learn more? Ask your healthcare provider. Call your local or state health department. Contact the Centers for Disease Control and Prevention (CDC): Call 215-831-1795 (1-800-CDC-INFO) or Visit CDC's https://gibson.com/ Vaccine Information Statement (Interim) Inactivated Influenza Vaccine (11/05/2017) This information is not intended to replace advice given to you by your health care provider. Make sure you discuss any questions you have with your health care provider. Document Released: 01/02/2006 Document Revised: 11/09/2017 Document Reviewed: 11/09/2017 Elsevier Interactive Patient Education  2019 [employer].   Next appointment: Please follow up in one year for your [facility]ness visit.     Preventive Care 66 Years and Older, Female Preventive care refers to lifestyle choices and visits with your health care provider that can promote health and wellness. What does preventive care include?  A yearly physical exam. This is also called an annual well check.  Dental exams once or twice a year.  Routine eye exams. Ask your health care provider how often you should have your eyes checked.  Personal lifestyle choices, including:  Daily care of your teeth and gums.  Regular physical activity.  Eating a healthy diet.  Avoiding tobacco and drug use.  Limiting alcohol use.  Practicing safe sex.  Taking low-dose aspirin every day.  Taking vitamin and mineral supplements as recommended by your health care provider. What happens during an annual well check? The services and screenings done by your health care provider during your annual well check will depend on your age, overall health, lifestyle risk  factors, and family history of disease. Counseling  Your health care provider may ask you questions about your:  Alcohol use.  Tobacco use.  Drug use.  Emotional well-being.  Home and relationship  well-being.  Sexual activity.  Eating habits.  History of falls.  Memory and ability to understand (cognition).  Work and work Statistician.  Reproductive health. Screening  You may have the following tests or measurements:  Height, weight, and BMI.  Blood pressure.  Lipid and cholesterol levels. These may be checked every 5 years, or more frequently if you are over 83 years old.  Skin check.  Lung cancer screening. You may have this screening every year starting at age 27 if you have a 30-pack-year history of smoking and currently smoke or have quit within the past 15 years.  Fecal occult blood test (FOBT) of the stool. You may have this test every year starting at age 44.  Flexible sigmoidoscopy or colonoscopy. You may have a sigmoidoscopy every 5 years or a colonoscopy every 10 years starting at age 48.  Hepatitis C blood test.  Hepatitis B blood test.  Sexually transmitted disease (STD) testing.  Diabetes screening. This is done by checking your blood sugar (glucose) after you have not eaten for a while (fasting). You may have this done every 1-3 years.  Bone density scan. This is done to screen for osteoporosis. You may have this done starting at age 25.  Mammogram. This may be done every 1-2 years. Talk to your health care provider about how often you should have regular mammograms. Talk with your health care provider about your test results, treatment options, and if necessary, the need for more tests. Vaccines  Your health care provider may recommend certain vaccines, such as:  Influenza vaccine. This is recommended every year.  Tetanus, diphtheria, and acellular pertussis (Tdap, Td) vaccine. You may need a Td booster every 10 years.  Zoster vaccine. You may need this after age 41.  Pneumococcal 13-valent conjugate (PCV13) vaccine. One dose is recommended after age 60.  Pneumococcal polysaccharide (PPSV23) vaccine. One dose is recommended after age  20. Talk to your health care provider about which screenings and vaccines you need and how often you need them. This information is not intended to replace advice given to you by your health care provider. Make sure you discuss any questions you have with your health care provider. Document Released: 04/06/2015 Document Revised: 11/28/2015 Document Reviewed: 01/09/2015 Elsevier Interactive Patient Education  2017 [place] Prevention in the Home Falls can cause injuries. They can happen to people of all ages. There are many things you can do to make your home safe and to help prevent falls. What can I do on the outside of my home?  Regularly fix the edges of walkways and driveways and fix any cracks.  Remove anything that might make you trip as you walk through a door, such as a raised step or threshold.  Trim any bushes or trees on the path to your home.  Use bright outdoor lighting.  Clear any walking paths of anything that might make someone trip, such as rocks or tools.  Regularly check to see if handrails are loose or broken. Make sure that both sides of any steps have handrails.  Any raised decks and porches should have guardrails on the edges.  Have any leaves, snow, or ice cleared regularly.  Use sand or salt on walking paths during winter.  Clean up any spills in your  garage right away. This includes oil or grease spills. What can I do in the bathroom?  Use night lights.  Install grab bars by the toilet and in the tub and shower. Do not use towel bars as grab bars.  Use non-skid mats or decals in the tub or shower.  If you need to sit down in the shower, use a plastic, non-slip stool.  Keep the floor dry. Clean up any water that spills on the floor as soon as it happens.  Remove soap buildup in the tub or shower regularly.  Attach bath mats securely with double-sided non-slip rug tape.  Do not have throw rugs and other things on the floor that can make  you trip. What can I do in the bedroom?  Use night lights.  Make sure that you have a light by your bed that is easy to reach.  Do not use any sheets or blankets that are too big for your bed. They should not hang down onto the floor.  Have a firm chair that has side arms. You can use this for support while you get dressed.  Do not have throw rugs and other things on the floor that can make you trip. What can I do in the kitchen?  Clean up any spills right away.  Avoid walking on wet floors.  Keep items that you use a lot in easy-to-reach places.  If you need to reach something above you, use a strong step stool that has a grab bar.  Keep electrical cords out of the way.  Do not use floor polish or wax that makes floors slippery. If you must use wax, use non-skid floor wax.  Do not have throw rugs and other things on the floor that can make you trip. What can I do with my stairs?  Do not leave any items on the stairs.  Make sure that there are handrails on both sides of the stairs and use them. Fix handrails that are broken or loose. Make sure that handrails are as long as the stairways.  Check any carpeting to make sure that it is firmly attached to the stairs. Fix any carpet that is loose or worn.  Avoid having throw rugs at the top or bottom of the stairs. If you do have throw rugs, attach them to the floor with carpet tape.  Make sure that you have a light switch at the top of the stairs and the bottom of the stairs. If you do not have them, ask someone to add them for you. What else can I do to help prevent falls?  Wear shoes that:  Do not have high heels.  Have rubber bottoms.  Are comfortable and fit you well.  Are closed at the toe. Do not wear sandals.  If you use a stepladder:  Make sure that it is fully opened. Do not climb a closed stepladder.  Make sure that both sides of the stepladder are locked into place.  Ask someone to hold it for you, if  possible.  Clearly mark and make sure that you can see:  Any grab bars or handrails.  First and last steps.  Where the edge of each step is.  Use tools that help you move around (mobility aids) if they are needed. These include:  Canes.  Walkers.  Scooters.  Crutches.  Turn on the lights when you go into a dark area. Replace any light bulbs as soon as they burn out.  Set  up your furniture so you have a clear path. Avoid moving your furniture around.  If any of your floors are uneven, fix them.  If there are any pets around you, be aware of where they are.  Review your medicines with your doctor. Some medicines can make you feel dizzy. This can increase your chance of falling. Ask your doctor what other things that you can do to help prevent falls. This information is not intended to replace advice given to you by your health care provider. Make sure you discuss any questions you have with your health care provider. Document Released: 01/04/2009 Document Revised: 08/16/2015 Document Reviewed: 04/14/2014 Elsevier Interactive Patient Education  2017 [employer].

## 2018-04-02 ENCOUNTER — Other Ambulatory Visit: Payer: Self-pay | Admitting: Family Medicine

## 2018-04-02 DIAGNOSIS — E0865 Diabetes mellitus due to underlying condition with hyperglycemia: Secondary | ICD-10-CM

## 2018-04-05 ENCOUNTER — Encounter: Payer: Self-pay | Admitting: Family Medicine

## 2018-04-05 ENCOUNTER — Ambulatory Visit (INDEPENDENT_AMBULATORY_CARE_PROVIDER_SITE_OTHER): Payer: Medicare HMO | Admitting: Family Medicine

## 2018-04-05 VITALS — BP 130/70 | HR 82 | Ht 62.0 in | Wt 138.0 lb

## 2018-04-05 DIAGNOSIS — Z91199 Patient's noncompliance with other medical treatment and regimen due to unspecified reason: Secondary | ICD-10-CM

## 2018-04-05 DIAGNOSIS — Z9119 Patient's noncompliance with other medical treatment and regimen: Secondary | ICD-10-CM | POA: Diagnosis not present

## 2018-04-05 DIAGNOSIS — I10 Essential (primary) hypertension: Secondary | ICD-10-CM

## 2018-04-05 DIAGNOSIS — E0865 Diabetes mellitus due to underlying condition with hyperglycemia: Secondary | ICD-10-CM

## 2018-04-05 MED ORDER — METFORMIN HCL 1000 MG PO TABS: 1000.0000 mg | ORAL_TABLET | Freq: Two times a day (BID) | ORAL | 1 refills | Status: DC

## 2018-04-05 MED ORDER — PIOGLITAZONE HCL 15 MG PO TABS: 15.0000 mg | ORAL_TABLET | Freq: Every day | ORAL | 1 refills | Status: DC

## 2018-04-05 MED ORDER — LISINOPRIL 40 MG PO TABS: 40.0000 mg | ORAL_TABLET | Freq: Every day | ORAL | 1 refills | Status: DC

## 2018-04-05 MED ORDER — GLIPIZIDE 5 MG PO TABS: 5.0000 mg | ORAL_TABLET | Freq: Two times a day (BID) | ORAL | 1 refills | Status: DC

## 2018-04-05 NOTE — Progress Notes (Signed)
Date:  04/05/2018   Name:  Ann Chaney   DOB:  11/21/1945   MRN:  [id number]   Chief Complaint: Hypertension  Hypertension  This is a chronic problem. The current episode started more than 1 year ago. The problem has been gradually improving since onset. The problem is controlled. Pertinent negatives include no anxiety, blurred vision, chest pain, headaches, malaise/fatigue, neck pain, orthopnea, palpitations, peripheral edema, PND, shortness of breath or sweats. There are no associated agents to hypertension. Risk factors for coronary artery disease include diabetes mellitus and dyslipidemia. Past treatments include ACE inhibitors. The current treatment provides moderate improvement. There are no compliance problems.  There is no history of angina, kidney disease, CAD/MI, CVA, heart failure, left ventricular hypertrophy, PVD or retinopathy. There is no history of chronic renal disease, hyperaldosteronism or a hypertension causing med.  Diabetes  She presents for her follow-up diabetic visit. She has type 2 diabetes mellitus. Her disease course has been stable. Pertinent negatives for hypoglycemia include no dizziness, headaches, nervousness/anxiousness or sweats. Pertinent negatives for diabetes include no blurred vision, no chest pain, no fatigue, no polydipsia, no polyphagia, no polyuria and no weakness. There are no hypoglycemic complications. Symptoms are worsening. There are no diabetic complications. Pertinent negatives for diabetic complications include no CVA, PVD or retinopathy.    Review of Systems  Constitutional: Negative.  Negative for chills, fatigue, fever, malaise/fatigue and unexpected weight change.  HENT: Negative for congestion, ear discharge, ear pain, rhinorrhea, sinus pressure, sneezing and sore throat.   Eyes: Negative for blurred vision, photophobia, pain, discharge, redness and itching.  Respiratory: Negative for cough, shortness of breath, wheezing and stridor.     Cardiovascular: Negative for chest pain, palpitations, orthopnea and PND.  Gastrointestinal: Negative for abdominal pain, blood in stool, constipation, diarrhea, nausea and vomiting.  Endocrine: Negative for cold intolerance, heat intolerance, polydipsia, polyphagia and polyuria.  Genitourinary: Negative for dysuria, flank pain, frequency, hematuria, menstrual problem, pelvic pain, urgency, vaginal bleeding and vaginal discharge.  Musculoskeletal: Negative for arthralgias, back pain, myalgias and neck pain.  Skin: Negative for rash.  Allergic/Immunologic: Negative for environmental allergies and food allergies.  Neurological: Negative for dizziness, weakness, light-headedness, numbness and headaches.  Hematological: Negative for adenopathy. Does not bruise/bleed easily.  Psychiatric/Behavioral: Negative for dysphoric mood. The patient is not nervous/anxious.     Patient Active Problem List   Diagnosis Date Noted  . Mixed diabetic hyperlipidemia associated with type 2 diabetes mellitus ([place]) 11/10/2017  . Tobacco dependence 11/10/2017  . Microcytosis 11/17/2016  . Depression, major, single episode, complete remission ([place]) 11/17/2016  . Insomnia 07/22/2016  . Hyperlipidemia 07/22/2016  . Type 2 diabetes mellitus with hyperglycemia, without long-term current use of insulin ([place]) 06/24/2016  . Dental abscess 08/27/2015  . Smoker 11/04/2013  . Goiter 01/26/2012  . Hypertension associated with diabetes ([place]) 12/17/2009    Allergies  Allergen Reactions  . Atorvastatin Other (See Comments)    Myalgias  . Pravastatin Sodium Nausea Only    Past Surgical History:  Procedure Laterality Date  . ABDOMINAL HYSTERECTOMY    . HYSTEROTOMY      Social History   Tobacco Use  . Smoking status: Current Every Day Smoker    Packs/day: 0.50    Types: Cigarettes  . Smokeless tobacco: Never Used  Substance Use Topics  . Alcohol use: No  . Drug use: No     Medication list has been  reviewed and updated.  Current Meds  Medication Sig  . latanoprost (  XALATAN) 0.005 % ophthalmic solution 1 drop at bedtime.  . Multiple Vitamin (MULTIVITAMIN) capsule Take 1 capsule by mouth daily.  . [DISCONTINUED] glipiZIDE (GLUCOTROL) 5 MG tablet Take 1 tablet (5 mg total) by mouth 2 (two) times daily before a meal.  . [DISCONTINUED] lisinopril (PRINIVIL,ZESTRIL) 40 MG tablet TAKE 1 TABLET BY MOUTH ONCE DAILY  . [DISCONTINUED] metFORMIN (GLUCOPHAGE) 1000 MG tablet Take 1 tablet (1,000 mg total) by mouth 2 (two) times daily.  . [DISCONTINUED] pioglitazone (ACTOS) 15 MG tablet Take 15 mg by mouth daily.  Marland Kitchen glipiZIDE (GLUCOTROL) 5 MG tablet Take 1 tablet (5 mg total) by mouth 2 (two) times daily before a meal.  . lisinopril (PRINIVIL,ZESTRIL) 40 MG tablet Take 1 tablet (40 mg total) by mouth daily.  . metFORMIN (GLUCOPHAGE) 1000 MG tablet Take 1 tablet (1,000 mg total) by mouth 2 (two) times daily.  . pioglitazone (ACTOS) 15 MG tablet Take 1 tablet (15 mg total) by mouth daily.    PHQ 2/9 Scores 03/29/2018 07/01/2017 06/24/2016  PHQ - 2 Score 0 0 0  PHQ- 9 Score - 0 -    Physical Exam Vitals signs and nursing note reviewed.  Constitutional:      General: She is not in acute distress.    Appearance: She is not diaphoretic.  HENT:     Head: Normocephalic and atraumatic.     Right Ear: External ear normal.     Left Ear: External ear normal.     Nose: Nose normal.  Eyes:     General:        Right eye: No discharge.        Left eye: No discharge.     Conjunctiva/sclera: Conjunctivae normal.     Pupils: Pupils are equal, round, and reactive to light.  Neck:     Musculoskeletal: Normal range of motion and neck supple.     Thyroid: No thyromegaly.     Vascular: No JVD.  Cardiovascular:     Rate and Rhythm: Normal rate and regular rhythm.     Heart sounds: Normal heart sounds. No murmur. No friction rub. No gallop.   Pulmonary:     Effort: Pulmonary effort is normal.     Breath  sounds: Normal breath sounds.  Abdominal:     General: Bowel sounds are normal.     Palpations: Abdomen is soft. There is no mass.     Tenderness: There is no abdominal tenderness. There is no guarding.  Musculoskeletal: Normal range of motion.  Lymphadenopathy:     Cervical: No cervical adenopathy.  Skin:    General: Skin is warm and dry.  Neurological:     Mental Status: She is alert.     Deep Tendon Reflexes: Reflexes are normal and symmetric.     BP 130/70   Pulse 82   Ht 5\' 2"  (1.575 m)   Wt 138 lb (62.6 kg)   BMI 25.24 kg/m   Assessment and Plan: 1. Diabetes mellitus due to underlying condition, uncontrolled, with hyperglycemia ([place]) He is supposed to be on glipizide 5 mg twice daily and metformin 1 g twice daily.  Has not been taking her antidiabetic medications on a regular basis.  But will check an A1c is only been the like the last 4 days.  It was made appointment with endocrinology reestablishing for evaluation and care.   Type Actos 15 mg daily was also called in was not associated per the note. - glipiZIDE (GLUCOTROL) 5 MG tablet; Take 1  tablet (5 mg total) by mouth 2 (two) times daily before a meal.  Dispense: 180 tablet; Refill: 1 - metFORMIN (GLUCOPHAGE) 1000 MG tablet; Take 1 tablet (1,000 mg total) by mouth 2 (two) times daily.  Dispense: 180 tablet; Refill: 1 - Hemoglobin A1c  2. Essential hypertension .  Uncontrolled.  Patient has not been taking medication.  Lisinopril 40 mg refilled.  Renal function panel obtained - Renal Function Panel  3. Noncompliance Has been noncompliance with her medication use.  And is also not continued with her endocrine appointments for her diabetes.  Patient was explained the importance of maintaining this

## 2018-04-06 LAB — RENAL FUNCTION PANEL
Albumin: 4.6 g/dL (ref 3.5–4.8)
BUN/Creatinine Ratio: 10 — ABNORMAL LOW (ref 12–28)
BUN: 8 mg/dL (ref 8–27)
CO2: 23 mmol/L (ref 20–29)
Calcium: 10.3 mg/dL (ref 8.7–10.3)
Chloride: 101 mmol/L (ref 96–106)
Creatinine, Ser: 0.77 mg/dL (ref 0.57–1.00)
GFR calc Af Amer: 90 mL/min/{1.73_m2} (ref >59)
GFR calc non Af Amer: 78 mL/min/{1.73_m2} (ref >59)
Glucose: 97 mg/dL (ref 65–99)
Phosphorus: 4 mg/dL (ref 3.0–4.3)
Potassium: 4.4 mmol/L (ref 3.5–5.2)
Sodium: 142 mmol/L (ref 134–144)

## 2018-04-06 LAB — HEMOGLOBIN A1C
Est. average glucose Bld gHb Est-mCnc: 169 mg/dL
Hgb A1c MFr Bld: 7.5 % — ABNORMAL HIGH (ref 4.8–5.6)

## 2018-04-09 DIAGNOSIS — E1165 Type 2 diabetes mellitus with hyperglycemia: Secondary | ICD-10-CM | POA: Diagnosis not present

## 2018-04-09 DIAGNOSIS — E042 Nontoxic multinodular goiter: Secondary | ICD-10-CM | POA: Diagnosis not present

## 2018-04-09 DIAGNOSIS — I10 Essential (primary) hypertension: Secondary | ICD-10-CM | POA: Diagnosis not present

## 2018-04-09 DIAGNOSIS — E782 Mixed hyperlipidemia: Secondary | ICD-10-CM | POA: Diagnosis not present

## 2018-04-09 DIAGNOSIS — E1159 Type 2 diabetes mellitus with other circulatory complications: Secondary | ICD-10-CM | POA: Diagnosis not present

## 2018-04-09 DIAGNOSIS — F17200 Nicotine dependence, unspecified, uncomplicated: Secondary | ICD-10-CM | POA: Diagnosis not present

## 2018-04-09 DIAGNOSIS — E1169 Type 2 diabetes mellitus with other specified complication: Secondary | ICD-10-CM | POA: Diagnosis not present

## 2018-04-16 DIAGNOSIS — E042 Nontoxic multinodular goiter: Secondary | ICD-10-CM | POA: Insufficient documentation

## 2018-10-15 DIAGNOSIS — E1165 Type 2 diabetes mellitus with hyperglycemia: Secondary | ICD-10-CM | POA: Diagnosis not present

## 2018-10-15 DIAGNOSIS — E1169 Type 2 diabetes mellitus with other specified complication: Secondary | ICD-10-CM | POA: Diagnosis not present

## 2018-10-15 DIAGNOSIS — E1159 Type 2 diabetes mellitus with other circulatory complications: Secondary | ICD-10-CM | POA: Diagnosis not present

## 2018-10-15 DIAGNOSIS — E042 Nontoxic multinodular goiter: Secondary | ICD-10-CM | POA: Diagnosis not present

## 2018-10-22 ENCOUNTER — Other Ambulatory Visit: Payer: Self-pay

## 2018-10-22 NOTE — Patient Outreach (Signed)
Ann Chaney) Care Management  10/22/2018  Ann Chaney 24-May-1945 [id number]   Medication Adherence call to [name] Voice message left with a call back number. [name] is showing past due on Metformin 1000 mg under [place].   [facility] Management Direct Dial [phone]  Fax [phone] [name]

## 2019-01-13 DIAGNOSIS — E782 Mixed hyperlipidemia: Secondary | ICD-10-CM | POA: Diagnosis not present

## 2019-01-13 DIAGNOSIS — E042 Nontoxic multinodular goiter: Secondary | ICD-10-CM | POA: Diagnosis not present

## 2019-01-13 DIAGNOSIS — E1169 Type 2 diabetes mellitus with other specified complication: Secondary | ICD-10-CM | POA: Diagnosis not present

## 2019-01-13 DIAGNOSIS — E1165 Type 2 diabetes mellitus with hyperglycemia: Secondary | ICD-10-CM | POA: Diagnosis not present

## 2019-01-13 LAB — HEMOGLOBIN A1C: Hemoglobin A1C: 7.2

## 2019-01-14 DIAGNOSIS — E042 Nontoxic multinodular goiter: Secondary | ICD-10-CM | POA: Diagnosis not present

## 2019-01-21 DIAGNOSIS — E1169 Type 2 diabetes mellitus with other specified complication: Secondary | ICD-10-CM | POA: Diagnosis not present

## 2019-01-21 DIAGNOSIS — E1165 Type 2 diabetes mellitus with hyperglycemia: Secondary | ICD-10-CM | POA: Diagnosis not present

## 2019-01-21 DIAGNOSIS — E042 Nontoxic multinodular goiter: Secondary | ICD-10-CM | POA: Diagnosis not present

## 2019-01-21 DIAGNOSIS — E1159 Type 2 diabetes mellitus with other circulatory complications: Secondary | ICD-10-CM | POA: Diagnosis not present

## 2019-02-03 DIAGNOSIS — E119 Type 2 diabetes mellitus without complications: Secondary | ICD-10-CM | POA: Diagnosis not present

## 2019-02-03 DIAGNOSIS — H401131 Primary open-angle glaucoma, bilateral, mild stage: Secondary | ICD-10-CM | POA: Diagnosis not present

## 2019-02-24 ENCOUNTER — Other Ambulatory Visit: Payer: Self-pay

## 2019-02-24 ENCOUNTER — Ambulatory Visit (INDEPENDENT_AMBULATORY_CARE_PROVIDER_SITE_OTHER): Payer: Medicare Other

## 2019-02-24 DIAGNOSIS — Z23 Encounter for immunization: Secondary | ICD-10-CM | POA: Diagnosis not present

## 2019-03-15 ENCOUNTER — Other Ambulatory Visit: Payer: Self-pay

## 2019-03-15 ENCOUNTER — Ambulatory Visit (INDEPENDENT_AMBULATORY_CARE_PROVIDER_SITE_OTHER): Payer: Medicare Other | Admitting: Family Medicine

## 2019-03-15 ENCOUNTER — Encounter: Payer: Self-pay | Admitting: Family Medicine

## 2019-03-15 VITALS — BP 150/80 | HR 76 | Ht 62.0 in | Wt 130.0 lb

## 2019-03-15 DIAGNOSIS — E7849 Other hyperlipidemia: Secondary | ICD-10-CM

## 2019-03-15 MED ORDER — EZETIMIBE 10 MG PO TABS: 10.0000 mg | ORAL_TABLET | Freq: Every day | ORAL | 0 refills | Status: DC

## 2019-03-15 NOTE — Progress Notes (Signed)
Date:  03/15/2019   Name:  Ann Chaney   DOB:  12-28-1945   MRN:  [id number]   Chief Complaint: Hyperlipidemia (is willing to try Zetia)  Hyperlipidemia This is a chronic problem. The current episode started more than 1 year ago. The problem is controlled. Recent lipid tests were reviewed and are normal. She has no history of chronic renal disease, diabetes, hypothyroidism, liver disease, obesity or nephrotic syndrome. There are no known factors aggravating her hyperlipidemia. Pertinent negatives include no chest pain, focal sensory loss, focal weakness, leg pain, myalgias or shortness of breath. Current antihyperlipidemic treatment includes diet change. The current treatment provides no improvement of lipids. There are no compliance problems.  Risk factors for coronary artery disease include dyslipidemia, hypertension and diabetes mellitus.    Lab Results  Component Value Date   CREATININE 0.77 04/05/2018   BUN 8 04/05/2018   NA 142 04/05/2018   K 4.4 04/05/2018   CL 101 04/05/2018   CO2 23 04/05/2018   Lab Results  Component Value Date   CHOL 253 (H) 10/01/2017   HDL 42 10/01/2017   LDLCALC 178 (H) 10/01/2017   TRIG 164 (H) 10/01/2017   CHOLHDL 6.0 (H) 10/01/2017   Lab Results  Component Value Date   TSH 0.763 07/01/2017   Lab Results  Component Value Date   HGBA1C 7.2 01/13/2019     Review of Systems  Constitutional: Negative for chills and fever.  HENT: Negative for drooling, ear discharge, ear pain and sore throat.   Respiratory: Negative for cough, shortness of breath and wheezing.   Cardiovascular: Negative for chest pain, palpitations and leg swelling.  Gastrointestinal: Negative for abdominal pain, blood in stool, constipation, diarrhea and nausea.  Endocrine: Negative for polydipsia.  Genitourinary: Negative for dysuria, frequency, hematuria and urgency.  Musculoskeletal: Negative for back pain, myalgias and neck pain.  Skin: Negative for rash.    Allergic/Immunologic: Negative for environmental allergies.  Neurological: Negative for dizziness, focal weakness and headaches.  Hematological: Does not bruise/bleed easily.  Psychiatric/Behavioral: Negative for suicidal ideas. The patient is not nervous/anxious.     Patient Active Problem List   Diagnosis Date Noted  . Mixed diabetic hyperlipidemia associated with type 2 diabetes mellitus ([place]) 11/10/2017  . Tobacco dependence 11/10/2017  . Microcytosis 11/17/2016  . Depression, major, single episode, complete remission ([place]) 11/17/2016  . Insomnia 07/22/2016  . Hyperlipidemia 07/22/2016  . Type 2 diabetes mellitus with hyperglycemia, without long-term current use of insulin ([place]) 06/24/2016  . Dental abscess 08/27/2015  . Smoker 11/04/2013  . Goiter 01/26/2012  . Hypertension associated with diabetes ([place]) 12/17/2009    Allergies  Allergen Reactions  . Atorvastatin Other (See Comments)    Myalgias  . Pravastatin Sodium Nausea Only    Past Surgical History:  Procedure Laterality Date  . ABDOMINAL HYSTERECTOMY    . HYSTEROTOMY      Social History   Tobacco Use  . Smoking status: Current Every Day Smoker    Packs/day: 0.50    Types: Cigarettes  . Smokeless tobacco: Never Used  Substance Use Topics  . Alcohol use: No  . Drug use: No     Medication list has been reviewed and updated.  Current Meds  Medication Sig  . glipiZIDE (GLUCOTROL) 5 MG tablet Take 1 tablet (5 mg total) by mouth 2 (two) times daily before a meal.  . latanoprost (XALATAN) 0.005 % ophthalmic solution 1 drop at bedtime.  Marland Kitchen lisinopril (PRINIVIL,ZESTRIL) 40 MG tablet Take  1 tablet (40 mg total) by mouth daily.  . metFORMIN (GLUCOPHAGE) 1000 MG tablet Take 1 tablet (1,000 mg total) by mouth 2 (two) times daily.  . Multiple Vitamin (MULTIVITAMIN) capsule Take 1 capsule by mouth daily.  . pioglitazone (ACTOS) 15 MG tablet Take 1 tablet (15 mg total) by mouth daily.    PHQ 2/9 Scores 03/15/2019  03/29/2018 07/01/2017 06/24/2016  PHQ - 2 Score 0 0 0 0  PHQ- 9 Score 0 - 0 -    BP Readings from Last 3 Encounters:  03/15/19 (!) 150/80  04/05/18 130/70  03/29/18 140/70    Physical Exam Vitals and nursing note reviewed.  Constitutional:      General: She is not in acute distress.    Appearance: She is not diaphoretic.  HENT:     Head: Normocephalic and atraumatic.     Right Ear: Tympanic membrane, ear canal and external ear normal.     Left Ear: Tympanic membrane, ear canal and external ear normal.     Nose: Nose normal.     Mouth/Throat:     Mouth: Mucous membranes are moist.  Eyes:     General:        Right eye: No discharge.        Left eye: No discharge.     Conjunctiva/sclera: Conjunctivae normal.     Pupils: Pupils are equal, round, and reactive to light.  Neck:     Thyroid: No thyromegaly.     Vascular: No JVD.  Cardiovascular:     Rate and Rhythm: Normal rate and regular rhythm.     Chest Wall: No thrill.     Heart sounds: Normal heart sounds, S1 normal and S2 normal. No murmur. No systolic murmur. No diastolic murmur. No friction rub. No gallop. No S3 or S4 sounds.   Pulmonary:     Effort: Pulmonary effort is normal.     Breath sounds: Normal breath sounds. No decreased breath sounds, wheezing, rhonchi or rales.  Abdominal:     General: Bowel sounds are normal.     Palpations: Abdomen is soft. There is no hepatomegaly, splenomegaly or mass.     Tenderness: There is no abdominal tenderness. There is no guarding.  Musculoskeletal:        General: Normal range of motion.     Cervical back: Normal range of motion and neck supple.  Lymphadenopathy:     Cervical: No cervical adenopathy.  Skin:    General: Skin is warm and dry.  Neurological:     Mental Status: She is alert.     Deep Tendon Reflexes: Reflexes are normal and symmetric.     Wt Readings from Last 3 Encounters:  03/15/19 130 lb (59 kg)  04/05/18 138 lb (62.6 kg)  03/29/18 138 lb (62.6 kg)     BP (!) 150/80   Pulse 76   Ht 5\' 2"  (1.575 m)   Wt 130 lb (59 kg)   BMI 23.78 kg/m   Assessment and Plan: 1. Hyperlipidemia, familial, high LDL Chronic.  Uncontrolled.  Patient is unable to take statin secondary to myalgias.  Patient is currently attempting controlled with diet alone.  Patient was given a Mediterranean diet and after discussion patient has agreed to take Zetia 10 mg once a day and return in 6 weeks for recheck and a fasting lipid level. - ezetimibe (ZETIA) 10 MG tablet; Take 1 tablet (10 mg total) by mouth daily.  Dispense: 60 tablet; Refill: 0

## 2019-03-15 NOTE — Patient Instructions (Signed)

## 2019-04-04 ENCOUNTER — Ambulatory Visit
Admission: RE | Admit: 2019-04-04 | Discharge: 2019-04-04 | Disposition: A | Discharge status: Home / Self Care | Payer: Medicare Other | Source: Ambulatory Visit | Attending: Family Medicine | Admitting: Family Medicine

## 2019-04-04 ENCOUNTER — Other Ambulatory Visit: Payer: Self-pay

## 2019-04-04 DIAGNOSIS — Z1231 Encounter for screening mammogram for malignant neoplasm of breast: Secondary | ICD-10-CM | POA: Diagnosis not present

## 2019-04-13 ENCOUNTER — Other Ambulatory Visit: Payer: Self-pay | Admitting: Family Medicine

## 2019-04-13 ENCOUNTER — Inpatient Hospital Stay
Admission: RE | Admit: 2019-04-13 | Discharge: 2019-04-13 | Disposition: A | Discharge status: Home / Self Care | Payer: Self-pay | Source: Ambulatory Visit | Attending: *Deleted | Admitting: *Deleted

## 2019-04-13 ENCOUNTER — Other Ambulatory Visit: Payer: Self-pay | Admitting: *Deleted

## 2019-04-13 DIAGNOSIS — Z1231 Encounter for screening mammogram for malignant neoplasm of breast: Secondary | ICD-10-CM

## 2019-04-13 DIAGNOSIS — N6489 Other specified disorders of breast: Secondary | ICD-10-CM

## 2019-04-13 DIAGNOSIS — R928 Other abnormal and inconclusive findings on diagnostic imaging of breast: Secondary | ICD-10-CM

## 2019-04-14 ENCOUNTER — Telehealth: Payer: Self-pay | Admitting: Family Medicine

## 2019-04-14 NOTE — Telephone Encounter (Signed)
Called to schedule Medicare Annual Wellness Visit with Nurse Health Advisor, Kasey Uthus at [facility]. If patient returns call, please schedule AWV with [facility] any date on [facility] schedule (Mon or Wed)  Questions regarding scheduling, please call  [phone] or MS Teams > [email or web address]  Kathryn Brown  Care Guide . Embedded Care Coordination [place]  Care Management [email or web address]  [phone]     

## 2019-04-15 ENCOUNTER — Ambulatory Visit
Admission: RE | Admit: 2019-04-15 | Discharge: 2019-04-15 | Disposition: A | Discharge status: Home / Self Care | Payer: Medicare Other | Source: Ambulatory Visit | Attending: Family Medicine | Admitting: Family Medicine

## 2019-04-15 DIAGNOSIS — R928 Other abnormal and inconclusive findings on diagnostic imaging of breast: Secondary | ICD-10-CM | POA: Diagnosis not present

## 2019-04-15 DIAGNOSIS — R922 Inconclusive mammogram: Secondary | ICD-10-CM | POA: Diagnosis not present

## 2019-04-15 DIAGNOSIS — N6489 Other specified disorders of breast: Secondary | ICD-10-CM | POA: Diagnosis not present

## 2019-04-15 DIAGNOSIS — E1165 Type 2 diabetes mellitus with hyperglycemia: Secondary | ICD-10-CM | POA: Diagnosis not present

## 2019-04-15 DIAGNOSIS — E042 Nontoxic multinodular goiter: Secondary | ICD-10-CM | POA: Diagnosis not present

## 2019-04-22 DIAGNOSIS — E0500 Thyrotoxicosis with diffuse goiter without thyrotoxic crisis or storm: Secondary | ICD-10-CM | POA: Diagnosis not present

## 2019-04-22 DIAGNOSIS — E119 Type 2 diabetes mellitus without complications: Secondary | ICD-10-CM | POA: Diagnosis not present

## 2019-04-22 DIAGNOSIS — E1169 Type 2 diabetes mellitus with other specified complication: Secondary | ICD-10-CM | POA: Diagnosis not present

## 2019-04-22 DIAGNOSIS — E1159 Type 2 diabetes mellitus with other circulatory complications: Secondary | ICD-10-CM | POA: Diagnosis not present

## 2019-04-26 ENCOUNTER — Ambulatory Visit (INDEPENDENT_AMBULATORY_CARE_PROVIDER_SITE_OTHER): Payer: Medicare Other | Admitting: Family Medicine

## 2019-04-26 ENCOUNTER — Encounter: Payer: Self-pay | Admitting: Family Medicine

## 2019-04-26 ENCOUNTER — Other Ambulatory Visit: Payer: Self-pay

## 2019-04-26 VITALS — BP 152/70 | HR 80 | Ht 62.0 in | Wt 130.0 lb

## 2019-04-26 DIAGNOSIS — E7849 Other hyperlipidemia: Secondary | ICD-10-CM

## 2019-04-26 DIAGNOSIS — I10 Essential (primary) hypertension: Secondary | ICD-10-CM

## 2019-04-26 DIAGNOSIS — F17200 Nicotine dependence, unspecified, uncomplicated: Secondary | ICD-10-CM

## 2019-04-26 MED ORDER — LISINOPRIL 40 MG PO TABS: 40.0000 mg | ORAL_TABLET | Freq: Every day | ORAL | 1 refills | Status: DC

## 2019-04-26 MED ORDER — EZETIMIBE 10 MG PO TABS: 10.0000 mg | ORAL_TABLET | Freq: Every day | ORAL | 1 refills | Status: DC

## 2019-04-26 MED ORDER — AMLODIPINE BESYLATE 2.5 MG PO TABS: 2.5000 mg | ORAL_TABLET | Freq: Every day | ORAL | 0 refills | Status: DC

## 2019-04-26 NOTE — Progress Notes (Signed)
Date:  04/26/2019   Name:  Ann Chaney   DOB:  1945/10/29   MRN:  [id number]   Chief Complaint: Hypertension (needed a good b/p reading this time)  Hypertension This is a chronic problem. The current episode started more than 1 year ago. The problem has been waxing and waning since onset. The problem is uncontrolled. Pertinent negatives include no anxiety, blurred vision, chest pain, headaches, malaise/fatigue, neck pain, orthopnea, palpitations, peripheral edema, PND, shortness of breath or sweats. There are no associated agents to hypertension. Risk factors for coronary artery disease include diabetes mellitus, dyslipidemia and post-menopausal state. Past treatments include ACE inhibitors. The current treatment provides moderate improvement. There are no compliance problems.  There is no history of angina, kidney disease, CAD/MI, CVA, heart failure, left ventricular hypertrophy, PVD or retinopathy. There is no history of chronic renal disease, a hypertension causing med or renovascular disease.  Hyperlipidemia This is a chronic problem. The current episode started more than 1 year ago. Recent lipid tests were reviewed and are variable. Exacerbating diseases include diabetes. She has no history of chronic renal disease, hypothyroidism, liver disease, obesity or nephrotic syndrome. Pertinent negatives include no chest pain, focal sensory loss, focal weakness, leg pain, myalgias or shortness of breath. Current antihyperlipidemic treatment includes ezetimibe.    Lab Results  Component Value Date   CREATININE 0.77 04/05/2018   BUN 8 04/05/2018   NA 142 04/05/2018   K 4.4 04/05/2018   CL 101 04/05/2018   CO2 23 04/05/2018   Lab Results  Component Value Date   CHOL 253 (H) 10/01/2017   HDL 42 10/01/2017   LDLCALC 178 (H) 10/01/2017   TRIG 164 (H) 10/01/2017   CHOLHDL 6.0 (H) 10/01/2017   Lab Results  Component Value Date   TSH 0.763 07/01/2017   Lab Results  Component Value Date    HGBA1C 7.2 01/13/2019     Review of Systems  Constitutional: Negative.  Negative for chills, fatigue, fever, malaise/fatigue and unexpected weight change.  HENT: Negative for congestion, ear discharge, ear pain, mouth sores, nosebleeds, postnasal drip, rhinorrhea, sinus pressure, sneezing and sore throat.   Eyes: Negative for blurred vision, photophobia, pain, discharge, redness and itching.  Respiratory: Negative for cough, choking, chest tightness, shortness of breath, wheezing and stridor.   Cardiovascular: Negative for chest pain, palpitations, orthopnea and PND.  Gastrointestinal: Negative for abdominal pain, blood in stool, constipation, diarrhea, nausea and vomiting.  Endocrine: Negative for cold intolerance, heat intolerance, polydipsia, polyphagia and polyuria.  Genitourinary: Negative for dysuria, flank pain, frequency, hematuria, menstrual problem, pelvic pain, urgency, vaginal bleeding and vaginal discharge.  Musculoskeletal: Negative for arthralgias, back pain, myalgias and neck pain.  Skin: Negative for rash.  Allergic/Immunologic: Negative for environmental allergies and food allergies.  Neurological: Negative for dizziness, focal weakness, weakness, light-headedness, numbness and headaches.  Hematological: Negative for adenopathy. Does not bruise/bleed easily.  Psychiatric/Behavioral: Negative for dysphoric mood. The patient is not nervous/anxious.     Patient Active Problem List   Diagnosis Date Noted  . Mixed diabetic hyperlipidemia associated with type 2 diabetes mellitus ([place]) 11/10/2017  . Tobacco dependence 11/10/2017  . Microcytosis 11/17/2016  . Depression, major, single episode, complete remission ([place]) 11/17/2016  . Insomnia 07/22/2016  . Hyperlipidemia 07/22/2016  . Type 2 diabetes mellitus with hyperglycemia, without long-term current use of insulin ([place]) 06/24/2016  . Dental abscess 08/27/2015  . Smoker 11/04/2013  . Goiter 01/26/2012  . Hypertension  associated with diabetes ([place]) 12/17/2009  Allergies  Allergen Reactions  . Atorvastatin Other (See Comments)    Myalgias  . Pravastatin Sodium Nausea Only    Past Surgical History:  Procedure Laterality Date  . ABDOMINAL HYSTERECTOMY    . HYSTEROTOMY      Social History   Tobacco Use  . Smoking status: Current Every Day Smoker    Packs/day: 0.50    Types: Cigarettes  . Smokeless tobacco: Never Used  Substance Use Topics  . Alcohol use: No  . Drug use: No     Medication list has been reviewed and updated.  Current Meds  Medication Sig  . ezetimibe (ZETIA) 10 MG tablet Take 1 tablet (10 mg total) by mouth daily.  Marland Kitchen glipiZIDE (GLUCOTROL) 5 MG tablet Take 1 tablet (5 mg total) by mouth 2 (two) times daily before a meal.  . latanoprost (XALATAN) 0.005 % ophthalmic solution 1 drop at bedtime.  Marland Kitchen lisinopril (PRINIVIL,ZESTRIL) 40 MG tablet Take 1 tablet (40 mg total) by mouth daily.  . metFORMIN (GLUCOPHAGE) 1000 MG tablet Take 1 tablet (1,000 mg total) by mouth 2 (two) times daily.  . methimazole (TAPAZOLE) 5 MG tablet Take 0.5 tablets by mouth daily. [employer]  . Multiple Vitamin (MULTIVITAMIN) capsule Take 1 capsule by mouth daily.  . pioglitazone (ACTOS) 15 MG tablet Take 1 tablet (15 mg total) by mouth daily.    PHQ 2/9 Scores 03/15/2019 03/29/2018 07/01/2017 06/24/2016  PHQ - 2 Score 0 0 0 0  PHQ- 9 Score 0 - 0 -    BP Readings from Last 3 Encounters:  04/26/19 (!) 152/70  03/15/19 (!) 150/80  04/05/18 130/70    Physical Exam Vitals and nursing note reviewed.  Constitutional:      General: She is not in acute distress.    Appearance: She is not diaphoretic.  HENT:     Head: Normocephalic and atraumatic.     Right Ear: Tympanic membrane, ear canal and external ear normal.     Left Ear: Tympanic membrane, ear canal and external ear normal.     Nose: Nose normal. No congestion or rhinorrhea.  Eyes:     General:        Right eye: No discharge.         Left eye: No discharge.     Conjunctiva/sclera: Conjunctivae normal.     Pupils: Pupils are equal, round, and reactive to light.  Neck:     Thyroid: No thyromegaly.     Vascular: No carotid bruit or JVD.  Cardiovascular:     Rate and Rhythm: Normal rate and regular rhythm.     Heart sounds: Normal heart sounds. No murmur. No friction rub. No gallop.   Pulmonary:     Effort: Pulmonary effort is normal.     Breath sounds: Normal breath sounds. No wheezing, rhonchi or rales.  Chest:     Chest wall: No tenderness.  Abdominal:     General: Bowel sounds are normal.     Palpations: Abdomen is soft. There is no mass.     Tenderness: There is no abdominal tenderness. There is no guarding.  Musculoskeletal:        General: Normal range of motion.     Cervical back: Normal range of motion and neck supple.  Lymphadenopathy:     Cervical: No cervical adenopathy.  Skin:    General: Skin is warm and dry.  Neurological:     Mental Status: She is alert.     Deep Tendon Reflexes: Reflexes  are normal and symmetric.     Wt Readings from Last 3 Encounters:  04/26/19 130 lb (59 kg)  03/15/19 130 lb (59 kg)  04/05/18 138 lb (62.6 kg)    BP (!) 152/70   Pulse 80   Ht 5\' 2"  (1.575 m)   Wt 130 lb (59 kg)   BMI 23.78 kg/m   Assessment and Plan:  1. Essential hypertension Chronic.  Controlled.  Stable.  Continue amlodipine 2.5 mg once a day.  Also continue lisinopril 40 mg 1 tablet daily.  Will check renal function panel. - amLODipine (NORVASC) 2.5 MG tablet; Take 1 tablet (2.5 mg total) by mouth daily.  Dispense: 90 tablet; Refill: 0 - Renal Function Panel - lisinopril (ZESTRIL) 40 MG tablet; Take 1 tablet (40 mg total) by mouth daily.  Dispense: 90 tablet; Refill: 1  2. Hyperlipidemia, familial, high LDL Chronic.  Controlled.  Stable.  Will check lipid panel.  In the meantime we will continue Zetia 10 mg once a day. - Lipid Panel With LDL/HDL Ratio - ezetimibe (ZETIA) 10 MG tablet; Take  1 tablet (10 mg total) by mouth daily.  Dispense: 90 tablet; Refill: 1  3. Tobacco dependence Patient has been advised of the health risks of smoking and counseled concerning cessation of tobacco products. I spent over 3 minutes for discussion and to answer questions.

## 2019-04-27 LAB — RENAL FUNCTION PANEL
Albumin: 4.9 g/dL — ABNORMAL HIGH (ref 3.7–4.7)
BUN/Creatinine Ratio: 13 (ref 12–28)
BUN: 10 mg/dL (ref 8–27)
CO2: 22 mmol/L (ref 20–29)
Calcium: 10.2 mg/dL (ref 8.7–10.3)
Chloride: 105 mmol/L (ref 96–106)
Creatinine, Ser: 0.77 mg/dL (ref 0.57–1.00)
GFR calc Af Amer: 89 mL/min/{1.73_m2} (ref >59)
GFR calc non Af Amer: 77 mL/min/{1.73_m2} (ref >59)
Glucose: 124 mg/dL — ABNORMAL HIGH (ref 65–99)
Phosphorus: 4.4 mg/dL — ABNORMAL HIGH (ref 3.0–4.3)
Potassium: 4.6 mmol/L (ref 3.5–5.2)
Sodium: 142 mmol/L (ref 134–144)

## 2019-04-27 LAB — LIPID PANEL WITH LDL/HDL RATIO
Cholesterol, Total: 201 mg/dL — ABNORMAL HIGH (ref 100–199)
HDL: 50 mg/dL (ref >39)
LDL Chol Calc (NIH): 130 mg/dL — ABNORMAL HIGH (ref 0–99)
LDL/HDL Ratio: 2.6 ratio (ref 0.0–3.2)
Triglycerides: 120 mg/dL (ref 0–149)
VLDL Cholesterol Cal: 21 mg/dL (ref 5–40)

## 2019-06-03 DIAGNOSIS — E119 Type 2 diabetes mellitus without complications: Secondary | ICD-10-CM | POA: Diagnosis not present

## 2019-06-03 DIAGNOSIS — E0500 Thyrotoxicosis with diffuse goiter without thyrotoxic crisis or storm: Secondary | ICD-10-CM | POA: Diagnosis not present

## 2019-06-27 ENCOUNTER — Telehealth: Payer: Self-pay | Admitting: Family Medicine

## 2019-06-27 NOTE — Telephone Encounter (Signed)
[facility] faxed refill request for the following medications:  amLODipine (NORVASC) 2.5 MG tablet  90 day supply  Last Rx: 04/26/2019 sent to [facility] 90 day supply LOV: 04/26/2019 NOV: 07/19/2019 Please advise. Thanks TNP

## 2019-07-19 ENCOUNTER — Ambulatory Visit (INDEPENDENT_AMBULATORY_CARE_PROVIDER_SITE_OTHER): Payer: Medicare Other | Admitting: Family Medicine

## 2019-07-19 ENCOUNTER — Other Ambulatory Visit: Payer: Self-pay

## 2019-07-19 ENCOUNTER — Encounter: Payer: Self-pay | Admitting: Family Medicine

## 2019-07-19 VITALS — BP 150/80 | HR 80 | Ht 62.0 in | Wt 130.0 lb

## 2019-07-19 DIAGNOSIS — I10 Essential (primary) hypertension: Secondary | ICD-10-CM

## 2019-07-19 MED ORDER — METOPROLOL SUCCINATE ER 25 MG PO TB24: 25.0000 mg | ORAL_TABLET | Freq: Every day | ORAL | 0 refills | Status: DC

## 2019-07-19 NOTE — Progress Notes (Signed)
Date:  07/19/2019   Name:  Ann Chaney   DOB:  Jan 12, 1946   MRN:  [id number]   Chief Complaint: Hypertension (added Amlodipine last visit. Made her swell and light-headed. Been off med x 11 weeks/ only took for 1 week)  Hypertension This is a chronic problem. The current episode started more than 1 year ago. The problem has been waxing and waning since onset. The problem is uncontrolled. Pertinent negatives include no anxiety, blurred vision, chest pain, headaches, malaise/fatigue, neck pain, orthopnea, palpitations, peripheral edema, PND, shortness of breath or sweats. There are no associated agents to hypertension. Risk factors for coronary artery disease include dyslipidemia and diabetes mellitus. Past treatments include ACE inhibitors. The current treatment provides mild improvement. There are no compliance problems.  There is no history of angina, kidney disease, CAD/MI, CVA, heart failure, left ventricular hypertrophy, PVD or retinopathy. There is no history of chronic renal disease, a hypertension causing med or renovascular disease.    Lab Results  Component Value Date   CREATININE 0.77 04/26/2019   BUN 10 04/26/2019   NA 142 04/26/2019   K 4.6 04/26/2019   CL 105 04/26/2019   CO2 22 04/26/2019   Lab Results  Component Value Date   CHOL 201 (H) 04/26/2019   HDL 50 04/26/2019   LDLCALC 130 (H) 04/26/2019   TRIG 120 04/26/2019   CHOLHDL 6.0 (H) 10/01/2017   Lab Results  Component Value Date   TSH 0.763 07/01/2017   Lab Results  Component Value Date   HGBA1C 7.2 01/13/2019   Lab Results  Component Value Date   WBC 7.4 07/01/2017   HGB 13.1 07/01/2017   HCT 40.8 07/01/2017   MCV 74 (L) 07/01/2017   PLT 374 07/01/2017   Lab Results  Component Value Date   ALT 15 07/01/2017   AST 15 07/01/2017   ALKPHOS 98 07/01/2017   BILITOT 0.2 07/01/2017     Review of Systems  Constitutional: Negative.  Negative for chills, fatigue, fever, malaise/fatigue and  unexpected weight change.  HENT: Negative for congestion, ear discharge, ear pain, rhinorrhea, sinus pressure, sneezing and sore throat.   Eyes: Negative for blurred vision, photophobia, pain, discharge, redness and itching.  Respiratory: Negative for cough, shortness of breath, wheezing and stridor.   Cardiovascular: Negative for chest pain, palpitations, orthopnea and PND.  Gastrointestinal: Negative for abdominal pain, blood in stool, constipation, diarrhea, nausea and vomiting.  Endocrine: Negative for cold intolerance, heat intolerance, polydipsia, polyphagia and polyuria.  Genitourinary: Negative for dysuria, flank pain, frequency, hematuria, menstrual problem, pelvic pain, urgency, vaginal bleeding and vaginal discharge.       Incontinence  Musculoskeletal: Negative for arthralgias, back pain, myalgias and neck pain.  Skin: Negative for rash.  Allergic/Immunologic: Negative for environmental allergies and food allergies.  Neurological: Negative for dizziness, weakness, light-headedness, numbness and headaches.  Hematological: Negative for adenopathy. Does not bruise/bleed easily.  Psychiatric/Behavioral: Negative for dysphoric mood. The patient is not nervous/anxious.     Patient Active Problem List   Diagnosis Date Noted  . Mixed diabetic hyperlipidemia associated with type 2 diabetes mellitus ([place]) 11/10/2017  . Tobacco dependence 11/10/2017  . Microcytosis 11/17/2016  . Depression, major, single episode, complete remission ([place]) 11/17/2016  . Insomnia 07/22/2016  . Hyperlipidemia 07/22/2016  . Type 2 diabetes mellitus with hyperglycemia, without long-term current use of insulin ([place]) 06/24/2016  . Dental abscess 08/27/2015  . Smoker 11/04/2013  . Goiter 01/26/2012  . Hypertension associated with diabetes ([place]) 12/17/2009  Allergies  Allergen Reactions  . Atorvastatin Other (See Comments)    Myalgias  . Pravastatin Sodium Nausea Only    Past Surgical History:    Procedure Laterality Date  . ABDOMINAL HYSTERECTOMY    . HYSTEROTOMY      Social History   Tobacco Use  . Smoking status: Current Every Day Smoker    Packs/day: 0.50    Types: Cigarettes  . Smokeless tobacco: Never Used  Substance Use Topics  . Alcohol use: No  . Drug use: No     Medication list has been reviewed and updated.  Current Meds  Medication Sig  . amLODipine (NORVASC) 2.5 MG tablet Take 1 tablet (2.5 mg total) by mouth daily.  Marland Kitchen ezetimibe (ZETIA) 10 MG tablet Take 1 tablet (10 mg total) by mouth daily.  Marland Kitchen glipiZIDE (GLUCOTROL) 5 MG tablet Take 1 tablet (5 mg total) by mouth 2 (two) times daily before a meal.  . latanoprost (XALATAN) 0.005 % ophthalmic solution 1 drop at bedtime.  Marland Kitchen lisinopril (ZESTRIL) 40 MG tablet Take 1 tablet (40 mg total) by mouth daily.  . metFORMIN (GLUCOPHAGE) 1000 MG tablet Take 1 tablet (1,000 mg total) by mouth 2 (two) times daily.  . methimazole (TAPAZOLE) 5 MG tablet Take 0.5 tablets by mouth daily. [employer]  . Multiple Vitamin (MULTIVITAMIN) capsule Take 1 capsule by mouth daily.  . pioglitazone (ACTOS) 15 MG tablet Take 1 tablet (15 mg total) by mouth daily.    PHQ 2/9 Scores 07/19/2019 03/15/2019 03/29/2018 07/01/2017  PHQ - 2 Score 0 0 0 0  PHQ- 9 Score 0 0 - 0    BP Readings from Last 3 Encounters:  07/19/19 (!) 150/80  04/26/19 (!) 152/70  03/15/19 (!) 150/80    Physical Exam Constitutional:      General: She is not in acute distress.    Appearance: She is not diaphoretic.  HENT:     Head: Normocephalic and atraumatic.     Right Ear: External ear normal.     Left Ear: External ear normal.     Nose: Nose normal.  Eyes:     General:        Right eye: No discharge.        Left eye: No discharge.     Conjunctiva/sclera: Conjunctivae normal.     Pupils: Pupils are equal, round, and reactive to light.  Neck:     Thyroid: No thyromegaly.     Vascular: No JVD.  Cardiovascular:     Rate and Rhythm: Normal rate  and regular rhythm.     Heart sounds: Normal heart sounds. No murmur. No friction rub. No gallop.   Pulmonary:     Effort: Pulmonary effort is normal.     Breath sounds: Normal breath sounds. No wheezing or rhonchi.  Abdominal:     General: Bowel sounds are normal.     Palpations: Abdomen is soft. There is no mass.     Tenderness: There is no abdominal tenderness. There is no guarding.  Musculoskeletal:        General: Normal range of motion.     Cervical back: Normal range of motion and neck supple.  Lymphadenopathy:     Cervical: No cervical adenopathy.  Skin:    General: Skin is warm and dry.  Neurological:     Mental Status: She is alert.     Deep Tendon Reflexes: Reflexes are normal and symmetric.     Wt Readings from Last 3 Encounters:  07/19/19 130 lb (59 kg)  04/26/19 130 lb (59 kg)  03/15/19 130 lb (59 kg)    BP (!) 150/80   Pulse 80   Ht 5\' 2"  (1.575 m)   Wt 130 lb (59 kg)   BMI 23.78 kg/m   Assessment and Plan: 1. Essential hypertension Chronic.  Uncontrolled.  Stable.  Patient was unable to tolerate amlodipine because she had some swelling in her ankles and felt dizzy.  We have discussed that we are rather limited in the direction we can take with her antihypertensives.  Patient has incontinence and diuretic would exacerbate that.  Patient is already on 40 mg of lisinopril and that is maxed in strength.  We will try 25 mg metoprolol XL once a day to see how she tolerates this. - metoprolol succinate (TOPROL-XL) 25 MG 24 hr tablet; Take 1 tablet (25 mg total) by mouth daily.  Dispense: 30 tablet; Refill: 0

## 2019-07-19 NOTE — Patient Instructions (Signed)
   Managing Your Hypertension Hypertension is commonly called high blood pressure. This is when the force of your blood pressing against the walls of your arteries is too strong. Arteries are blood vessels that carry blood from your heart throughout your body. Hypertension forces the heart to work harder to pump blood, and may cause the arteries to become narrow or stiff. Having untreated or uncontrolled hypertension can cause heart attack, stroke, kidney disease, and other problems. What are blood pressure readings? A blood pressure reading consists of a higher number over a lower number. Ideally, your blood pressure should be below 120/80. The first ("top") number is called the systolic pressure. It is a measure of the pressure in your arteries as your heart beats. The second ("bottom") number is called the diastolic pressure. It is a measure of the pressure in your arteries as the heart relaxes. What does my blood pressure reading mean? Blood pressure is classified into four stages. Based on your blood pressure reading, your health care provider may use the following stages to determine what type of treatment you need, if any. Systolic pressure and diastolic pressure are measured in a unit called mm Hg. Normal  Systolic pressure: below 120.  Diastolic pressure: below 80. Elevated  Systolic pressure: 120-129.  Diastolic pressure: below 80. Hypertension stage 1  Systolic pressure: 130-139.  Diastolic pressure: 80-89. Hypertension stage 2  Systolic pressure: 140 or above.  Diastolic pressure: 90 or above. What health risks are associated with hypertension? Managing your hypertension is an important responsibility. Uncontrolled hypertension can lead to:  A heart attack.  A stroke.  A weakened blood vessel (aneurysm).  Heart failure.  Kidney damage.  Eye damage.  Metabolic syndrome.  Memory and concentration problems. What changes can I make to manage my  hypertension? Hypertension can be managed by making lifestyle changes and possibly by taking medicines. Your health care provider will help you make a plan to bring your blood pressure within a normal range. Eating and drinking   Eat a diet that is high in fiber and potassium, and low in salt (sodium), added sugar, and fat. An example eating plan is called the DASH (Dietary Approaches to Stop Hypertension) diet. To eat this way: ? Eat plenty of fresh fruits and vegetables. Try to fill half of your plate at each meal with fruits and vegetables. ? Eat whole grains, such as whole wheat pasta, brown rice, or whole grain bread. Fill about one quarter of your plate with whole grains. ? Eat low-fat diary products. ? Avoid fatty cuts of meat, processed or cured meats, and poultry with skin. Fill about one quarter of your plate with lean proteins such as fish, chicken without skin, beans, eggs, and tofu. ? Avoid premade and processed foods. These tend to be higher in sodium, added sugar, and fat.  Reduce your daily sodium intake. Most people with hypertension should eat less than 1,500 mg of sodium a day.  Limit alcohol intake to no more than 1 drink a day for nonpregnant women and 2 drinks a day for men. One drink equals 12 oz of beer, 5 oz of wine, or 1 oz of hard liquor. Lifestyle  Work with your health care provider to maintain a healthy body weight, or to lose weight. Ask what an ideal weight is for you.  Get at least 30 minutes of exercise that causes your heart to beat faster (aerobic exercise) most days of the week. Activities may include walking, swimming, or biking.    Include exercise to strengthen your muscles (resistance exercise), such as weight lifting, as part of your weekly exercise routine. Try to do these types of exercises for 30 minutes at least 3 days a week.  Do not use any products that contain nicotine or tobacco, such as cigarettes and e-cigarettes. If you need help quitting,  ask your health care provider.  Control any long-term (chronic) conditions you have, such as high cholesterol or diabetes. Monitoring  Monitor your blood pressure at home as told by your health care provider. Your personal target blood pressure may vary depending on your medical conditions, your age, and other factors.  Have your blood pressure checked regularly, as often as told by your health care provider. Working with your health care provider  Review all the medicines you take with your health care provider because there may be side effects or interactions.  Talk with your health care provider about your diet, exercise habits, and other lifestyle factors that may be contributing to hypertension.  Visit your health care provider regularly. Your health care provider can help you create and adjust your plan for managing hypertension. Will I need medicine to control my blood pressure? Your health care provider may prescribe medicine if lifestyle changes are not enough to get your blood pressure under control, and if:  Your systolic blood pressure is 130 or higher.  Your diastolic blood pressure is 80 or higher. Take medicines only as told by your health care provider. Follow the directions carefully. Blood pressure medicines must be taken as prescribed. The medicine does not work as well when you skip doses. Skipping doses also puts you at risk for problems. Contact a health care provider if:  You think you are having a reaction to medicines you have taken.  You have repeated (recurrent) headaches.  You feel dizzy.  You have swelling in your ankles.  You have trouble with your vision. Get help right away if:  You develop a severe headache or confusion.  You have unusual weakness or numbness, or you feel faint.  You have severe pain in your chest or abdomen.  You vomit repeatedly.  You have trouble breathing. Summary  Hypertension is when the force of blood pumping  through your arteries is too strong. If this condition is not controlled, it may put you at risk for serious complications.  Your personal target blood pressure may vary depending on your medical conditions, your age, and other factors. For most people, a normal blood pressure is less than 120/80.  Hypertension is managed by lifestyle changes, medicines, or both. Lifestyle changes include weight loss, eating a healthy, low-sodium diet, exercising more, and limiting alcohol. This information is not intended to replace advice given to you by your health care provider. Make sure you discuss any questions you have with your health care provider. Document Revised: 07/02/2018 Document Reviewed: 02/06/2016 Elsevier Patient Education  2020 [employer]  

## 2019-07-22 DIAGNOSIS — E0500 Thyrotoxicosis with diffuse goiter without thyrotoxic crisis or storm: Secondary | ICD-10-CM | POA: Diagnosis not present

## 2019-07-28 DIAGNOSIS — H2513 Age-related nuclear cataract, bilateral: Secondary | ICD-10-CM | POA: Diagnosis not present

## 2019-07-28 DIAGNOSIS — E119 Type 2 diabetes mellitus without complications: Secondary | ICD-10-CM | POA: Diagnosis not present

## 2019-07-28 DIAGNOSIS — H401131 Primary open-angle glaucoma, bilateral, mild stage: Secondary | ICD-10-CM | POA: Diagnosis not present

## 2019-07-29 DIAGNOSIS — E119 Type 2 diabetes mellitus without complications: Secondary | ICD-10-CM | POA: Diagnosis not present

## 2019-07-29 DIAGNOSIS — E1169 Type 2 diabetes mellitus with other specified complication: Secondary | ICD-10-CM | POA: Diagnosis not present

## 2019-07-29 DIAGNOSIS — E0500 Thyrotoxicosis with diffuse goiter without thyrotoxic crisis or storm: Secondary | ICD-10-CM | POA: Diagnosis not present

## 2019-07-29 DIAGNOSIS — E1159 Type 2 diabetes mellitus with other circulatory complications: Secondary | ICD-10-CM | POA: Diagnosis not present

## 2019-08-01 ENCOUNTER — Ambulatory Visit (INDEPENDENT_AMBULATORY_CARE_PROVIDER_SITE_OTHER): Payer: Medicare Other

## 2019-08-01 DIAGNOSIS — Z0000 Encounter for general adult medical examination without abnormal findings: Secondary | ICD-10-CM | POA: Diagnosis not present

## 2019-08-01 NOTE — Patient Instructions (Signed)
[name] , Thank you for taking time to come for your Medicare Wellness Visit. I appreciate your ongoing commitment to your health goals. Please review the following plan we discussed and let me know if I can assist you in the future.   Screening recommendations/referrals: Colonoscopy: Cologuard done 2018 Mammogram: done 04/15/19 Bone Density: due Recommended yearly ophthalmology/optometry visit for glaucoma screening and checkup Recommended yearly dental visit for hygiene and checkup  Vaccinations: Influenza vaccine: done 02/24/19 Pneumococcal vaccine: done 10/24/14 Tdap vaccine: done 09/11/09 Shingles vaccine: Shingrix discussed. Please contact your pharmacy for coverage information.  Covid-19: done 05/17/19 & 06/07/19   Advanced directives: Advance directive discussed with you today. I have provided a copy for you to complete at home and have notarized. Once this is complete please bring a copy in to our office so we can scan it into your chart.  Conditions/risks identified: Recommend increasing physical activity   Next appointment: Please follow up in one year for your Medicare Annual Wellness visit.     Preventive Care 2 Years and Older, Female Preventive care refers to lifestyle choices and visits with your health care provider that can promote health and wellness. What does preventive care include?  A yearly physical exam. This is also called an annual well check.  Dental exams once or twice a year.  Routine eye exams. Ask your health care provider how often you should have your eyes checked.  Personal lifestyle choices, including:  Daily care of your teeth and gums.  Regular physical activity.  Eating a healthy diet.  Avoiding tobacco and drug use.  Limiting alcohol use.  Practicing safe sex.  Taking low-dose aspirin every day.  Taking vitamin and mineral supplements as recommended by your health care provider. What happens during an annual well check? The  services and screenings done by your health care provider during your annual well check will depend on your age, overall health, lifestyle risk factors, and family history of disease. Counseling  Your health care provider may ask you questions about your:  Alcohol use.  Tobacco use.  Drug use.  Emotional well-being.  Home and relationship well-being.  Sexual activity.  Eating habits.  History of falls.  Memory and ability to understand (cognition).  Work and work Statistician.  Reproductive health. Screening  You may have the following tests or measurements:  Height, weight, and BMI.  Blood pressure.  Lipid and cholesterol levels. These may be checked every 5 years, or more frequently if you are over 52 years old.  Skin check.  Lung cancer screening. You may have this screening every year starting at age 67 if you have a 30-pack-year history of smoking and currently smoke or have quit within the past 15 years.  Fecal occult blood test (FOBT) of the stool. You may have this test every year starting at age 23.  Flexible sigmoidoscopy or colonoscopy. You may have a sigmoidoscopy every 5 years or a colonoscopy every 10 years starting at age 48.  Hepatitis C blood test.  Hepatitis B blood test.  Sexually transmitted disease (STD) testing.  Diabetes screening. This is done by checking your blood sugar (glucose) after you have not eaten for a while (fasting). You may have this done every 1-3 years.  Bone density scan. This is done to screen for osteoporosis. You may have this done starting at age 23.  Mammogram. This may be done every 1-2 years. Talk to your health care provider about how often you should have regular mammograms.  Talk with your health care provider about your test results, treatment options, and if necessary, the need for more tests. Vaccines  Your health care provider may recommend certain vaccines, such as:  Influenza vaccine. This is recommended  every year.  Tetanus, diphtheria, and acellular pertussis (Tdap, Td) vaccine. You may need a Td booster every 10 years.  Zoster vaccine. You may need this after age 30.  Pneumococcal 13-valent conjugate (PCV13) vaccine. One dose is recommended after age 15.  Pneumococcal polysaccharide (PPSV23) vaccine. One dose is recommended after age 55. Talk to your health care provider about which screenings and vaccines you need and how often you need them. This information is not intended to replace advice given to you by your health care provider. Make sure you discuss any questions you have with your health care provider. Document Released: 04/06/2015 Document Revised: 11/28/2015 Document Reviewed: 01/09/2015 Elsevier Interactive Patient Education  2017 [place] Prevention in the Home Falls can cause injuries. They can happen to people of all ages. There are many things you can do to make your home safe and to help prevent falls. What can I do on the outside of my home?  Regularly fix the edges of walkways and driveways and fix any cracks.  Remove anything that might make you trip as you walk through a door, such as a raised step or threshold.  Trim any bushes or trees on the path to your home.  Use bright outdoor lighting.  Clear any walking paths of anything that might make someone trip, such as rocks or tools.  Regularly check to see if handrails are loose or broken. Make sure that both sides of any steps have handrails.  Any raised decks and porches should have guardrails on the edges.  Have any leaves, snow, or ice cleared regularly.  Use sand or salt on walking paths during winter.  Clean up any spills in your garage right away. This includes oil or grease spills. What can I do in the bathroom?  Use night lights.  Install grab bars by the toilet and in the tub and shower. Do not use towel bars as grab bars.  Use non-skid mats or decals in the tub or shower.  If  you need to sit down in the shower, use a plastic, non-slip stool.  Keep the floor dry. Clean up any water that spills on the floor as soon as it happens.  Remove soap buildup in the tub or shower regularly.  Attach bath mats securely with double-sided non-slip rug tape.  Do not have throw rugs and other things on the floor that can make you trip. What can I do in the bedroom?  Use night lights.  Make sure that you have a light by your bed that is easy to reach.  Do not use any sheets or blankets that are too big for your bed. They should not hang down onto the floor.  Have a firm chair that has side arms. You can use this for support while you get dressed.  Do not have throw rugs and other things on the floor that can make you trip. What can I do in the kitchen?  Clean up any spills right away.  Avoid walking on wet floors.  Keep items that you use a lot in easy-to-reach places.  If you need to reach something above you, use a strong step stool that has a grab bar.  Keep electrical cords out of the way.  Do not use floor polish or wax that makes floors slippery. If you must use wax, use non-skid floor wax.  Do not have throw rugs and other things on the floor that can make you trip. What can I do with my stairs?  Do not leave any items on the stairs.  Make sure that there are handrails on both sides of the stairs and use them. Fix handrails that are broken or loose. Make sure that handrails are as long as the stairways.  Check any carpeting to make sure that it is firmly attached to the stairs. Fix any carpet that is loose or worn.  Avoid having throw rugs at the top or bottom of the stairs. If you do have throw rugs, attach them to the floor with carpet tape.  Make sure that you have a light switch at the top of the stairs and the bottom of the stairs. If you do not have them, ask someone to add them for you. What else can I do to help prevent falls?  Wear shoes  that:  Do not have high heels.  Have rubber bottoms.  Are comfortable and fit you well.  Are closed at the toe. Do not wear sandals.  If you use a stepladder:  Make sure that it is fully opened. Do not climb a closed stepladder.  Make sure that both sides of the stepladder are locked into place.  Ask someone to hold it for you, if possible.  Clearly mark and make sure that you can see:  Any grab bars or handrails.  First and last steps.  Where the edge of each step is.  Use tools that help you move around (mobility aids) if they are needed. These include:  Canes.  Walkers.  Scooters.  Crutches.  Turn on the lights when you go into a dark area. Replace any light bulbs as soon as they burn out.  Set up your furniture so you have a clear path. Avoid moving your furniture around.  If any of your floors are uneven, fix them.  If there are any pets around you, be aware of where they are.  Review your medicines with your doctor. Some medicines can make you feel dizzy. This can increase your chance of falling. Ask your doctor what other things that you can do to help prevent falls. This information is not intended to replace advice given to you by your health care provider. Make sure you discuss any questions you have with your health care provider. Document Released: 01/04/2009 Document Revised: 08/16/2015 Document Reviewed: 04/14/2014 Elsevier Interactive Patient Education  2017 [employer].

## 2019-08-01 NOTE — Progress Notes (Signed)
Subjective:   Ann Chaney is a 74 y.o. female who presents for Medicare Annual (Subsequent) preventive examination.  Virtual Visit via Telephone Note  I connected with  Ann Chaney on 08/01/19 at  2:40 PM EDT by telephone and verified that I am speaking with the correct person using two identifiers.  Medicare Annual Wellness visit completed telephonically due to Covid-19 pandemic.   Location: Patient: home Provider: office   I discussed the limitations, risks, security and privacy concerns of performing an evaluation and management service by telephone and the availability of in person appointments. The patient expressed understanding and agreed to proceed.  Unable to perform video visit due to patient does not have video capability.   Some vital signs may be absent or patient reported.   Clemetine Marker, LPN    Review of Systems:   Cardiac Risk Factors include: advanced age (>54men, >66 women);diabetes mellitus;dyslipidemia;hypertension;smoking/ tobacco exposure     Objective:     Vitals: There were no vitals taken for this visit.  There is no height or weight on file to calculate BMI.  Advanced Directives 08/01/2019 03/29/2018 12/30/2016 09/29/2016  Does Patient Have a Medical Advance Directive? No No No No  Would patient like information on creating a medical advance directive? Yes (MAU/Ambulatory/Procedural Areas - Information given) Yes (MAU/Ambulatory/Procedural Areas - Information given) - -    Tobacco Social History   Tobacco Use  Smoking Status Current Every Day Smoker  . Packs/day: 0.50  . Types: Cigarettes  Smokeless Tobacco Never Used     Ready to quit: Not Answered Counseling given: Not Answered   Clinical Intake:  Pre-visit preparation completed: Yes  Pain : No/denies pain     Nutritional Risks: None Diabetes: Yes CBG done?: No Did pt. bring in CBG monitor from home?: No   Nutrition Risk Assessment:  Has the patient had any N/V/D  within the last 2 months?  No  Does the patient have any non-healing wounds?  No  Has the patient had any unintentional weight loss or weight gain?  No   Diabetes:  Is the patient diabetic?  Yes  If diabetic, was a CBG obtained today?  No  Did the patient bring in their glucometer from home?  No  How often do you monitor your CBG's? daily.   Financial Strains and Diabetes Management:  Are you having any financial strains with the device, your supplies or your medication? No .  Does the patient want to be seen by Chronic Care Management for management of their diabetes?  No  Would the patient like to be referred to a Nutritionist or for Diabetic Management?  No   Diabetic Exams:  Diabetic Eye Exam: Completed 12/23/18.   Diabetic Foot Exam: Completed 10/15/18.   How often do you need to have someone help you when you read instructions, pamphlets, or other written materials from your doctor or pharmacy?: 1 - Never  Interpreter Needed?: No  Information entered by :: Clemetine Marker LPN  Past Medical History:  Diagnosis Date  . Diabetes mellitus without complication ([place])   . Glaucoma   . Glaucoma   . Hyperlipidemia   . Hypertension    Past Surgical History:  Procedure Laterality Date  . ABDOMINAL HYSTERECTOMY    . HYSTEROTOMY     Family History  Problem Relation Age of Onset  . Cancer Mother        sternum  . Emphysema Mother   . Diabetes Mother   . Glaucoma  Father   . Dementia Father   . Breast cancer Maternal Aunt    Social History   Socioeconomic History  . Marital status: Divorced    Spouse name: Not on file  . Number of children: 1  . Years of education: Not on file  . Highest education level: Associate degree: academic program  Occupational History  . Occupation: retired  Tobacco Use  . Smoking status: Current Every Day Smoker    Packs/day: 0.50    Types: Cigarettes  . Smokeless tobacco: Never Used  Substance and Sexual Activity  . Alcohol use: No  .  Drug use: No  . Sexual activity: Not Currently  Other Topics Concern  . Not on file  Social History Narrative   Pt lives alone.    Social Determinants of Health   Financial Resource Strain: Low Risk   . Difficulty of Paying Living Expenses: Not very hard  Food Insecurity: No Food Insecurity  . Worried About Charity fundraiser in the Last Year: Never true  . Ran Out of Food in the Last Year: Never true  Transportation Needs: No Transportation Needs  . Lack of Transportation (Medical): No  . Lack of Transportation (Non-Medical): No  Physical Activity: Inactive  . Days of Exercise per Week: 0 days  . Minutes of Exercise per Session: 0 min  Stress: No Stress Concern Present  . Feeling of Stress : Not at all  Social Connections: Somewhat Isolated  . Frequency of Communication with Friends and Family: More than three times a week  . Frequency of Social Gatherings with Friends and Family: More than three times a week  . Attends Religious Services: More than 4 times per year  . Active Member of Clubs or Organizations: No  . Attends Archivist Meetings: Never  . Marital Status: Divorced    Outpatient Encounter Medications as of 08/01/2019  Medication Sig  . ACCU-CHEK AVIVA PLUS test strip 1 each daily.  . Accu-Chek Softclix Lancets lancets   . ezetimibe (ZETIA) 10 MG tablet Take 1 tablet (10 mg total) by mouth daily.  Marland Kitchen glipiZIDE (GLUCOTROL) 5 MG tablet Take 1 tablet (5 mg total) by mouth 2 (two) times daily before a meal.  . latanoprost (XALATAN) 0.005 % ophthalmic solution 1 drop at bedtime.  Marland Kitchen lisinopril (ZESTRIL) 40 MG tablet Take 1 tablet (40 mg total) by mouth daily.  . metFORMIN (GLUCOPHAGE) 1000 MG tablet Take 1 tablet (1,000 mg total) by mouth 2 (two) times daily.  . methimazole (TAPAZOLE) 5 MG tablet Take 0.5 tablets by mouth daily. [employer]  . metoprolol succinate (TOPROL-XL) 25 MG 24 hr tablet Take 1 tablet (25 mg total) by mouth daily.  . Multiple  Vitamin (MULTIVITAMIN) capsule Take 1 capsule by mouth daily.  . pioglitazone (ACTOS) 15 MG tablet Take 1 tablet (15 mg total) by mouth daily.   No facility-administered encounter medications on file as of 08/01/2019.    Activities of Daily Living In your present state of health, do you have any difficulty performing the following activities: 08/01/2019  Hearing? N  Comment declines hearing aids  Vision? N  Difficulty concentrating or making decisions? N  Walking or climbing stairs? N  Dressing or bathing? N  Doing errands, shopping? N  Preparing Food and eating ? N  Using the Toilet? N  In the past six months, have you accidently leaked urine? N  Do you have problems with loss of bowel control? N  Managing your Medications? N  Managing your Finances? N  Housekeeping or managing your Housekeeping? N  Some recent data might be hidden    Patient Care Team: Juline Patch, MD as PCP - General (Family Medicine)    Assessment:   This is a routine wellness examination for [place].  Exercise Activities and Dietary recommendations Current Exercise Habits: The patient does not participate in regular exercise at present  Goals    . Increase physical activity     Recommend increasing physical activity to 150 minutes per week.        Fall Risk Fall Risk  08/01/2019 07/19/2019 03/29/2018 07/01/2017 06/24/2016  Falls in the past year? 0 0 0 No No  Number falls in past yr: 0 - 0 - -  Injury with Fall? 0 - - - -  Risk for fall due to : No Fall Risks - - - -  Follow up Falls prevention discussed Falls evaluation completed Falls prevention discussed - -   FALL RISK PREVENTION PERTAINING TO THE HOME:  Any stairs in or around the home? Yes  If so, do they handrails?  No - 1 step outside  Home free of loose throw rugs in walkways, pet beds, electrical cords, etc? Yes  Adequate lighting in your home to reduce risk of falls? Yes   ASSISTIVE DEVICES UTILIZED TO PREVENT FALLS:  Life alert?  No  Use of a cane, walker or w/c? No  Grab bars in the bathroom? No  Shower chair or bench in shower? No  Elevated toilet seat or a handicapped toilet? Yes   DME ORDERS:  DME order needed?  No   TIMED UP AND GO:  Was the test performed? No . Telephonic visit.   Education: Fall risk prevention has been discussed.  Intervention(s) required? No   Depression Screen PHQ 2/9 Scores 08/01/2019 07/19/2019 03/15/2019 03/29/2018  PHQ - 2 Score 0 0 0 0  PHQ- 9 Score - 0 0 -     Cognitive Function     6CIT Screen 08/01/2019 03/29/2018  What Year? 0 points 0 points  What month? 0 points 0 points  What time? 0 points 0 points  Count back from 20 0 points 0 points  Months in reverse 0 points 2 points  Repeat phrase 0 points 0 points  Total Score 0 2    Immunization History  Administered Date(s) Administered  . Fluad Quad(high Dose 65+) 02/24/2019  . Influenza, High Dose Seasonal PF 12/30/2016, 03/29/2018  . Influenza, Seasonal, Injecte, Preservative Fre 12/11/2011  . Influenza,inj,Quad PF,6+ Mos 12/13/2013, 02/01/2015, 01/24/2016  . PFIZER SARS-COV-2 Vaccination 05/17/2019, 06/07/2019  . Pneumococcal Conjugate-13 10/07/2013  . Pneumococcal Polysaccharide-23 10/24/2014  . Tdap 09/11/2009    Qualifies for Shingles Vaccine? Yes . Due for Shingrix. Education has been provided regarding the importance of this vaccine. Pt has been advised to call insurance company to determine out of pocket expense. Advised may also receive vaccine at local pharmacy or Health Dept. Verbalized acceptance and understanding.  Tdap: Up to date  Flu Vaccine: Up to date  Pneumococcal Vaccine: Up to date  Covid-19 Vaccine: Up to date   Screening Tests Health Maintenance  Topic Date Due  . HEMOGLOBIN A1C  07/14/2019  . DEXA SCAN  04/25/2020 (Originally 12/10/2011)  . TETANUS/TDAP  09/12/2019  . FOOT EXAM  10/15/2019  . INFLUENZA VACCINE  10/23/2019  . OPHTHALMOLOGY EXAM  12/23/2019  . MAMMOGRAM   04/03/2021  . COLONOSCOPY  03/24/2026  . COVID-19 Vaccine  Completed  .  Hepatitis C Screening  Completed  . PNA vac Low Risk Adult  Completed    Cancer Screenings:  Colorectal Screening: Cologuard completed 09/2016. Repeat every 3 years; Reordered today.   Mammogram: Completed 04/04/19. Repeat every year;    Bone Density:  DUE. Pt declines at this time.   Lung Cancer Screening: (Low Dose CT Chest recommended if Age 58-80 years, 30 pack-year currently smoking OR have quit w/in 15years.) does not qualify.   Additional Screening:  Hepatitis C Screening: does qualify; Completed 04/01/17  Vision Screening: Recommended annual ophthalmology exams for early detection of glaucoma and other disorders of the eye. Is the patient up to date with their annual eye exam?  Yes  Who is the provider or what is the name of the office in which the pt attends annual eye exams? Dr. Ellin Mayhew  Dental Screening: Recommended annual dental exams for proper oral hygiene  Community Resource Referral:  CRR required this visit?  No      Plan:     I have personally reviewed and addressed the Medicare Annual Wellness questionnaire and have noted the following in the patient's chart:  A. Medical and social history B. Use of alcohol, tobacco or illicit drugs  C. Current medications and supplements D. Functional ability and status E.  Nutritional status F.  Physical activity G. Advance directives H. List of other physicians I.  Hospitalizations, surgeries, and ER visits in previous 12 months J.  [place] such as hearing and vision if needed, cognitive and depression L. Referrals and appointments   In addition, I have reviewed and discussed with patient certain preventive protocols, quality metrics, and best practice recommendations. A written personalized care plan for preventive services as well as general preventive health recommendations were provided to patient.   Signed,  Clemetine Marker,  LPN Nurse Health Advisor   Nurse Notes: none

## 2019-08-09 ENCOUNTER — Encounter: Payer: Self-pay | Admitting: Family Medicine

## 2019-08-09 ENCOUNTER — Ambulatory Visit (INDEPENDENT_AMBULATORY_CARE_PROVIDER_SITE_OTHER): Payer: Medicare Other | Admitting: Family Medicine

## 2019-08-09 ENCOUNTER — Other Ambulatory Visit: Payer: Self-pay

## 2019-08-09 VITALS — BP 130/70 | HR 80 | Ht 62.0 in | Wt 130.0 lb

## 2019-08-09 DIAGNOSIS — Z1211 Encounter for screening for malignant neoplasm of colon: Secondary | ICD-10-CM | POA: Diagnosis not present

## 2019-08-09 DIAGNOSIS — I10 Essential (primary) hypertension: Secondary | ICD-10-CM | POA: Diagnosis not present

## 2019-08-09 DIAGNOSIS — F17200 Nicotine dependence, unspecified, uncomplicated: Secondary | ICD-10-CM

## 2019-08-09 DIAGNOSIS — E7849 Other hyperlipidemia: Secondary | ICD-10-CM

## 2019-08-09 MED ORDER — METOPROLOL SUCCINATE ER 25 MG PO TB24: 25.0000 mg | ORAL_TABLET | Freq: Every day | ORAL | 1 refills | Status: DC

## 2019-08-09 NOTE — Progress Notes (Signed)
Date:  08/09/2019   Name:  Ann Chaney   DOB:  1945-05-22   MRN:  [id number]   Chief Complaint: Hyperlipidemia (recheck b/p)  Hyperlipidemia This is a chronic problem. The current episode started more than 1 year ago. The problem is controlled. Recent lipid tests were reviewed and are normal. She has no history of chronic renal disease, diabetes, hypothyroidism, liver disease, obesity or nephrotic syndrome. There are no known factors aggravating her hyperlipidemia. Pertinent negatives include no chest pain, focal sensory loss, focal weakness, leg pain, myalgias or shortness of breath. Current antihyperlipidemic treatment includes ezetimibe. There are no compliance problems.  Risk factors for coronary artery disease include hypertension and dyslipidemia.  Hypertension This is a chronic problem. The current episode started more than 1 year ago. The problem has been gradually improving since onset. The problem is controlled. Pertinent negatives include no chest pain, headaches or shortness of breath. Risk factors for coronary artery disease include dyslipidemia. Past treatments include beta blockers and ACE inhibitors. The current treatment provides moderate improvement. There is no history of chronic renal disease.    Lab Results  Component Value Date   CREATININE 0.77 04/26/2019   BUN 10 04/26/2019   NA 142 04/26/2019   K 4.6 04/26/2019   CL 105 04/26/2019   CO2 22 04/26/2019   Lab Results  Component Value Date   CHOL 201 (H) 04/26/2019   HDL 50 04/26/2019   LDLCALC 130 (H) 04/26/2019   TRIG 120 04/26/2019   CHOLHDL 6.0 (H) 10/01/2017   Lab Results  Component Value Date   TSH 0.763 07/01/2017   Lab Results  Component Value Date   HGBA1C 7.2 01/13/2019   Lab Results  Component Value Date   WBC 7.4 07/01/2017   HGB 13.1 07/01/2017   HCT 40.8 07/01/2017   MCV 74 (L) 07/01/2017   PLT 374 07/01/2017   Lab Results  Component Value Date   ALT 15 07/01/2017   AST 15  07/01/2017   ALKPHOS 98 07/01/2017   BILITOT 0.2 07/01/2017     Review of Systems  Constitutional: Negative.  Negative for chills, fatigue, fever and unexpected weight change.  HENT: Negative for congestion, ear discharge, ear pain, rhinorrhea, sinus pressure, sneezing and sore throat.   Eyes: Negative for photophobia, pain, discharge, redness and itching.  Respiratory: Negative for cough, shortness of breath, wheezing and stridor.   Cardiovascular: Negative for chest pain.  Gastrointestinal: Negative for abdominal pain, blood in stool, constipation, diarrhea, nausea and vomiting.  Endocrine: Negative for cold intolerance, heat intolerance, polydipsia, polyphagia and polyuria.  Genitourinary: Negative for dysuria, flank pain, frequency, hematuria, menstrual problem, pelvic pain, urgency, vaginal bleeding and vaginal discharge.  Musculoskeletal: Negative for arthralgias, back pain and myalgias.  Skin: Negative for rash.  Allergic/Immunologic: Negative for environmental allergies and food allergies.  Neurological: Negative for dizziness, focal weakness, weakness, light-headedness, numbness and headaches.  Hematological: Negative for adenopathy. Does not bruise/bleed easily.  Psychiatric/Behavioral: Negative for dysphoric mood. The patient is not nervous/anxious.     Patient Active Problem List   Diagnosis Date Noted  . Multinodular goiter 04/16/2018  . Mixed diabetic hyperlipidemia associated with type 2 diabetes mellitus ([place]) 11/10/2017  . Tobacco dependence 11/10/2017  . Microcytosis 11/17/2016  . Depression, major, single episode, complete remission ([place]) 11/17/2016  . Insomnia 07/22/2016  . Hyperlipidemia 07/22/2016  . Type 2 diabetes mellitus with hyperglycemia, without long-term current use of insulin ([place]) 06/24/2016  . Dental abscess 08/27/2015  . Smoker  11/04/2013  . Goiter 01/26/2012  . Hypertension associated with diabetes ([place]) 12/17/2009    Allergies  Allergen  Reactions  . Atorvastatin Other (See Comments)    Myalgias  . Pravastatin Sodium Nausea Only    Past Surgical History:  Procedure Laterality Date  . ABDOMINAL HYSTERECTOMY    . HYSTEROTOMY      Social History   Tobacco Use  . Smoking status: Current Every Day Smoker    Packs/day: 0.50    Types: Cigarettes  . Smokeless tobacco: Never Used  Substance Use Topics  . Alcohol use: No  . Drug use: No     Medication list has been reviewed and updated.  Current Meds  Medication Sig  . ACCU-CHEK AVIVA PLUS test strip 1 each daily.  . Accu-Chek Softclix Lancets lancets   . ezetimibe (ZETIA) 10 MG tablet Take 1 tablet (10 mg total) by mouth daily.  Marland Kitchen glipiZIDE (GLUCOTROL) 5 MG tablet Take 1 tablet (5 mg total) by mouth 2 (two) times daily before a meal.  . latanoprost (XALATAN) 0.005 % ophthalmic solution 1 drop at bedtime.  Marland Kitchen lisinopril (ZESTRIL) 40 MG tablet Take 1 tablet (40 mg total) by mouth daily.  . metFORMIN (GLUCOPHAGE) 1000 MG tablet Take 1 tablet (1,000 mg total) by mouth 2 (two) times daily.  . methimazole (TAPAZOLE) 5 MG tablet Take 0.5 tablets by mouth daily. [employer]  . metoprolol succinate (TOPROL-XL) 25 MG 24 hr tablet Take 1 tablet (25 mg total) by mouth daily.  . Multiple Vitamin (MULTIVITAMIN) capsule Take 1 capsule by mouth daily.  . pioglitazone (ACTOS) 15 MG tablet Take 1 tablet (15 mg total) by mouth daily.    PHQ 2/9 Scores 08/09/2019 08/01/2019 07/19/2019 03/15/2019  PHQ - 2 Score 0 0 0 0  PHQ- 9 Score 0 - 0 0    BP Readings from Last 3 Encounters:  08/09/19 130/70  07/19/19 (!) 150/80  04/26/19 (!) 152/70    Physical Exam Vitals and nursing note reviewed.  Constitutional:      Appearance: She is well-developed.  HENT:     Head: Normocephalic.     Right Ear: Tympanic membrane and external ear normal.     Left Ear: Tympanic membrane and external ear normal.  Eyes:     General: Lids are everted, no foreign bodies appreciated. No scleral  icterus.       Left eye: No foreign body or hordeolum.     Conjunctiva/sclera: Conjunctivae normal.     Right eye: Right conjunctiva is not injected.     Left eye: Left conjunctiva is not injected.     Pupils: Pupils are equal, round, and reactive to light.  Neck:     Thyroid: No thyromegaly.     Vascular: No JVD.     Trachea: No tracheal deviation.  Cardiovascular:     Rate and Rhythm: Normal rate and regular rhythm.     Heart sounds: Normal heart sounds. No murmur. No friction rub. No gallop.   Pulmonary:     Effort: Pulmonary effort is normal. No respiratory distress.     Breath sounds: Normal breath sounds. No wheezing or rales.  Abdominal:     General: Bowel sounds are normal.     Palpations: Abdomen is soft. There is no mass.     Tenderness: There is no abdominal tenderness. There is no guarding or rebound.  Musculoskeletal:        General: No tenderness. Normal range of motion.  Cervical back: Normal range of motion and neck supple.  Lymphadenopathy:     Cervical: No cervical adenopathy.  Skin:    General: Skin is warm.     Findings: No rash.  Neurological:     Mental Status: She is alert and oriented to person, place, and time.     Cranial Nerves: No cranial nerve deficit.     Deep Tendon Reflexes: Reflexes normal.  Psychiatric:        Mood and Affect: Mood is not anxious or depressed.     Wt Readings from Last 3 Encounters:  08/09/19 130 lb (59 kg)  07/19/19 130 lb (59 kg)  04/26/19 130 lb (59 kg)    BP 130/70   Pulse 80   Ht 5\' 2"  (1.575 m)   Wt 130 lb (59 kg)   BMI 23.78 kg/m   Assessment and Plan:  1. Essential hypertension Chronic.  Controlled.  Stable.  Patient returns for recheck of blood pressure having initiated metoprolol XL 25 mg along with continuance of her Zestril 40 mg once a day.  Patient is tolerating this regimen well and blood pressure is in acceptable range of 130/70.  We will recheck patient in 6 months at which time we will do  renal function panel. - metoprolol succinate (TOPROL-XL) 25 MG 24 hr tablet; Take 1 tablet (25 mg total) by mouth daily.  Dispense: 90 tablet; Refill: 1  2. Hyperlipidemia, familial, high LDL Chronic.  Controlled.  Stable.  Review patient's last lipid was near acceptable.  Patient is unable to take statin but we will continue on Zetia 10 mg which she is indeed tolerating well.  3. Tobacco dependence Patient has been counseled to stop smoking but has seems to be content with "cutting back ".  This is been discussed that this is not sufficient and patient needs to try to abstain completely.  Patient has been advised of the health risks of smoking and counseled concerning cessation of tobacco products. I spent over 3 minutes for discussion and to answer questions.  4. Colon cancer screening This is been discussed with patient again and patient is awaiting Cologuard which has been ordered and she will proceed with that upon arrival.

## 2019-09-23 ENCOUNTER — Encounter: Payer: Self-pay | Admitting: Family Medicine

## 2019-09-23 DIAGNOSIS — G720 Drug-induced myopathy: Secondary | ICD-10-CM | POA: Insufficient documentation

## 2019-09-23 DIAGNOSIS — Z789 Other specified health status: Secondary | ICD-10-CM | POA: Insufficient documentation

## 2019-10-28 DIAGNOSIS — E1169 Type 2 diabetes mellitus with other specified complication: Secondary | ICD-10-CM | POA: Diagnosis not present

## 2019-10-28 DIAGNOSIS — E0500 Thyrotoxicosis with diffuse goiter without thyrotoxic crisis or storm: Secondary | ICD-10-CM | POA: Diagnosis not present

## 2019-10-28 DIAGNOSIS — E782 Mixed hyperlipidemia: Secondary | ICD-10-CM | POA: Diagnosis not present

## 2019-10-28 DIAGNOSIS — E119 Type 2 diabetes mellitus without complications: Secondary | ICD-10-CM | POA: Diagnosis not present

## 2019-11-04 DIAGNOSIS — E119 Type 2 diabetes mellitus without complications: Secondary | ICD-10-CM | POA: Diagnosis not present

## 2019-11-04 DIAGNOSIS — E0500 Thyrotoxicosis with diffuse goiter without thyrotoxic crisis or storm: Secondary | ICD-10-CM | POA: Diagnosis not present

## 2019-11-04 DIAGNOSIS — E1159 Type 2 diabetes mellitus with other circulatory complications: Secondary | ICD-10-CM | POA: Diagnosis not present

## 2019-11-04 DIAGNOSIS — E1169 Type 2 diabetes mellitus with other specified complication: Secondary | ICD-10-CM | POA: Diagnosis not present

## 2019-11-14 ENCOUNTER — Other Ambulatory Visit: Payer: Self-pay | Admitting: Family Medicine

## 2019-11-14 DIAGNOSIS — E7849 Other hyperlipidemia: Secondary | ICD-10-CM

## 2019-11-14 NOTE — Telephone Encounter (Signed)
Requested Prescriptions  Pending Prescriptions Disp Refills  . ezetimibe (ZETIA) 10 MG tablet [Pharmacy Med Name: Ezetimibe 10 MG Oral Tablet] 90 tablet 0    Sig: Take 1 tablet by mouth once daily     Cardiovascular:  Antilipid - Sterol Transport Inhibitors Failed - 11/14/2019  2:50 PM      Failed - Total Cholesterol in normal range and within 360 days    Cholesterol, Total  Date Value Ref Range Status  04/26/2019 201 (H) 100 - 199 mg/dL Final         Failed - LDL in normal range and within 360 days    LDL Chol Calc (NIH)  Date Value Ref Range Status  04/26/2019 130 (H) 0 - 99 mg/dL Final         Passed - HDL in normal range and within 360 days    HDL  Date Value Ref Range Status  04/26/2019 50 >39 mg/dL Final         Passed - Triglycerides in normal range and within 360 days    Triglycerides  Date Value Ref Range Status  04/26/2019 120 0 - 149 mg/dL Final         Passed - Valid encounter within last 12 months    Recent Outpatient Visits          3 months ago Essential hypertension   [place], Deanna C, MD   3 months ago Essential hypertension   [place], Deanna C, MD   6 months ago Essential hypertension   [place], Deanna C, MD   8 months ago Hyperlipidemia, familial, high LDL   [facility] Juline Patch, MD   1 year ago Diabetes mellitus due to underlying condition, uncontrolled, with hyperglycemia [facility])   [place], Deanna C, MD      Future Appointments            In 2 months Juline Patch, MD [facility]

## 2019-11-17 LAB — HEMOGLOBIN A1C: Hemoglobin A1C: 6.1

## 2019-11-24 ENCOUNTER — Telehealth: Payer: Self-pay | Admitting: Family Medicine

## 2019-11-24 LAB — HM DIABETES FOOT EXAM

## 2019-11-24 NOTE — Telephone Encounter (Signed)
Tamsen Snider NP with [facility] will fax test result over  Requesting a calling back after a review of the labs. Cb- [phone]

## 2019-11-24 NOTE — Telephone Encounter (Signed)
Ann Chaney  Bi lateral carotid brits changes of PAD in both legs

## 2019-11-24 NOTE — Telephone Encounter (Signed)
Left message for Ann Chaney to call back

## 2019-11-25 NOTE — Telephone Encounter (Signed)
Called pt to state she would need appt with Dr Ronnald Ramp for eval bruits to determine whether or not to send to [facility]. Also called Candace again and left another vm

## 2019-11-29 ENCOUNTER — Other Ambulatory Visit: Payer: Self-pay

## 2019-12-15 DIAGNOSIS — E0500 Thyrotoxicosis with diffuse goiter without thyrotoxic crisis or storm: Secondary | ICD-10-CM | POA: Diagnosis not present

## 2020-01-23 LAB — HM DIABETES EYE EXAM

## 2020-02-09 ENCOUNTER — Encounter: Payer: Self-pay | Admitting: Family Medicine

## 2020-02-09 ENCOUNTER — Other Ambulatory Visit: Payer: Self-pay

## 2020-02-09 ENCOUNTER — Ambulatory Visit (INDEPENDENT_AMBULATORY_CARE_PROVIDER_SITE_OTHER): Payer: Medicare Other | Admitting: Family Medicine

## 2020-02-09 VITALS — BP 150/100 | HR 80 | Ht 62.0 in | Wt 131.0 lb

## 2020-02-09 DIAGNOSIS — F17200 Nicotine dependence, unspecified, uncomplicated: Secondary | ICD-10-CM | POA: Diagnosis not present

## 2020-02-09 DIAGNOSIS — E7849 Other hyperlipidemia: Secondary | ICD-10-CM

## 2020-02-09 DIAGNOSIS — Z23 Encounter for immunization: Secondary | ICD-10-CM | POA: Diagnosis not present

## 2020-02-09 DIAGNOSIS — I10 Essential (primary) hypertension: Secondary | ICD-10-CM | POA: Diagnosis not present

## 2020-02-09 DIAGNOSIS — E0865 Diabetes mellitus due to underlying condition with hyperglycemia: Secondary | ICD-10-CM | POA: Diagnosis not present

## 2020-02-09 MED ORDER — METOPROLOL SUCCINATE ER 50 MG PO TB24: 50.0000 mg | ORAL_TABLET | Freq: Every day | ORAL | 1 refills | Status: DC

## 2020-02-09 MED ORDER — LISINOPRIL 40 MG PO TABS: 40.0000 mg | ORAL_TABLET | Freq: Every day | ORAL | 1 refills | Status: DC

## 2020-02-09 MED ORDER — EZETIMIBE 10 MG PO TABS: 10.0000 mg | ORAL_TABLET | Freq: Every day | ORAL | 1 refills | Status: DC

## 2020-02-09 NOTE — Patient Instructions (Signed)
Coping with Quitting Smoking  Quitting smoking is a physical and mental challenge. You will face cravings, withdrawal symptoms, and temptation. Before quitting, work with your health care provider to make a plan that can help you cope. Preparation can help you quit and keep you from giving in. How can I cope with cravings? Cravings usually last for 5-10 minutes. If you get through it, the craving will pass. Consider taking the following actions to help you cope with cravings:  Keep your mouth busy: ? Chew sugar-free gum. ? Suck on hard candies or a straw. ? Brush your teeth.  Keep your hands and body busy: ? Immediately change to a different activity when you feel a craving. ? Squeeze or play with a ball. ? Do an activity or a hobby, like making bead jewelry, practicing needlepoint, or working with wood. ? Mix up your normal routine. ? Take a short exercise break. Go for a quick walk or run up and down stairs. ? Spend time in public places where smoking is not allowed.  Focus on doing something kind or helpful for someone else.  Call a friend or family member to talk during a craving.  Join a support group.  Call a quit line, such as 1-800-QUIT-NOW.  Talk with your health care provider about medicines that might help you cope with cravings and make quitting easier for you. How can I deal with withdrawal symptoms? Your body may experience negative effects as it tries to get used to not having nicotine in the system. These effects are called withdrawal symptoms. They may include:  Feeling hungrier than normal.  Trouble concentrating.  Irritability.  Trouble sleeping.  Feeling depressed.  Restlessness and agitation.  Craving a cigarette. To manage withdrawal symptoms:  Avoid places, people, and activities that trigger your cravings.  Remember why you want to quit.  Get plenty of sleep.  Avoid coffee and other caffeinated drinks. These may worsen some of your  symptoms. How can I handle social situations? Social situations can be difficult when you are quitting smoking, especially in the first few weeks. To manage this, you can:  Avoid parties, bars, and other social situations where people might be smoking.  Avoid alcohol.  Leave right away if you have the urge to smoke.  Explain to your family and friends that you are quitting smoking. Ask for understanding and support.  Plan activities with friends or family where smoking is not an option. What are some ways I can cope with stress? Wanting to smoke may cause stress, and stress can make you want to smoke. Find ways to manage your stress. Relaxation techniques can help. For example:  Breathe slowly and deeply, in through your nose and out through your mouth.  Listen to soothing, relaxing music.  Talk with a family member or friend about your stress.  Light a candle.  Soak in a bath or take a shower.  Think about a peaceful place. What are some ways I can prevent weight gain? Be aware that many people gain weight after they quit smoking. However, not everyone does. To keep from gaining weight, have a plan in place before you quit and stick to the plan after you quit. Your plan should include:  Having healthy snacks. When you have a craving, it may help to: ? Eat plain popcorn, crunchy carrots, celery, or other cut vegetables. ? Chew sugar-free gum.  Changing how you eat: ? Eat small portion sizes at meals. ? Eat 4-6 small meals   throughout the day instead of 1-2 large meals a day. ? Be mindful when you eat. Do not watch television or do other things that might distract you as you eat.  Exercising regularly: ? Make time to exercise each day. If you do not have time for a long workout, do short bouts of exercise for 5-10 minutes several times a day. ? Do some form of strengthening exercise, like weight lifting, and some form of aerobic exercise, like running or swimming.  Drinking  plenty of water or other low-calorie or no-calorie drinks. Drink 6-8 glasses of water daily, or as much as instructed by your health care provider. Summary  Quitting smoking is a physical and mental challenge. You will face cravings, withdrawal symptoms, and temptation to smoke again. Preparation can help you as you go through these challenges.  You can cope with cravings by keeping your mouth busy (such as by chewing gum), keeping your body and hands busy, and making calls to family, friends, or a helpline for people who want to quit smoking.  You can cope with withdrawal symptoms by avoiding places where people smoke, avoiding drinks with caffeine, and getting plenty of rest.  Ask your health care provider about the different ways to prevent weight gain, avoid stress, and handle social situations. This information is not intended to replace advice given to you by your health care provider. Make sure you discuss any questions you have with your health care provider. Document Revised: 02/20/2017 Document Reviewed: 03/07/2016 Elsevier Patient Education  2020 [employer]  

## 2020-02-09 NOTE — Progress Notes (Signed)
Date:  02/09/2020   Name:  Ann Chaney   DOB:  December 09, 1945   MRN:  [id number]   Chief Complaint: Diabetes (f/u), Hyperlipidemia, and Hypertension  Diabetes She presents for her follow-up diabetic visit. She has type 2 diabetes mellitus. Her disease course has been stable. There are no hypoglycemic associated symptoms. Pertinent negatives for hypoglycemia include no dizziness, headaches, nervousness/anxiousness or sweats. Pertinent negatives for diabetes include no blurred vision, no chest pain, no fatigue, no foot paresthesias, no foot ulcerations, no polydipsia, no polyphagia, no polyuria, no visual change, no weakness and no weight loss. There are no hypoglycemic complications. Symptoms are stable. There are no diabetic complications. Pertinent negatives for diabetic complications include no CVA, PVD or retinopathy. Risk factors for coronary artery disease include diabetes mellitus, hypertension and dyslipidemia. Her weight is stable. She is following a generally healthy diet. Meal planning includes avoidance of concentrated sweets. She participates in exercise intermittently. Her home blood glucose trend is fluctuating minimally. Her breakfast blood glucose is taken between 8-9 am. Her breakfast blood glucose range is generally 110-130 mg/dl. An ACE inhibitor/angiotensin II receptor blocker is being taken. She does not see a podiatrist.Eye exam is current.  Hyperlipidemia This is a chronic problem. The current episode started more than 1 year ago. The problem is controlled. Recent lipid tests were reviewed and are normal. Exacerbating diseases include diabetes. She has no history of chronic renal disease, hypothyroidism, liver disease, obesity or nephrotic syndrome. Factors aggravating her hyperlipidemia include smoking. Pertinent negatives include no chest pain, focal sensory loss, focal weakness, leg pain, myalgias or shortness of breath. Current antihyperlipidemic treatment includes diet  change. There are no compliance problems.  Risk factors for coronary artery disease include dyslipidemia, hypertension and female sex.  Hypertension This is a chronic problem. The current episode started more than 1 year ago. The problem has been gradually worsening since onset. The problem is controlled. Pertinent negatives include no anxiety, blurred vision, chest pain, headaches, malaise/fatigue, neck pain, orthopnea, peripheral edema, PND, shortness of breath or sweats. There are no associated agents to hypertension. Risk factors for coronary artery disease include diabetes mellitus and dyslipidemia. Past treatments include ACE inhibitors and beta blockers. There are no compliance problems.  There is no history of angina, kidney disease, CAD/MI, CVA, heart failure, left ventricular hypertrophy, PVD or retinopathy. There is no history of chronic renal disease, a hypertension causing med or renovascular disease.  Nicotine Dependence Presents for follow-up visit. Symptoms are negative for fatigue and sore throat. Her urge triggers include company of smokers. The symptoms have been stable. Her first smoke is from 8 to 10 AM. She smokes < 1/2 a pack of cigarettes per day. Compliance with prior treatments has been poor.    Lab Results  Component Value Date   CREATININE 0.77 04/26/2019   BUN 10 04/26/2019   NA 142 04/26/2019   K 4.6 04/26/2019   CL 105 04/26/2019   CO2 22 04/26/2019   Lab Results  Component Value Date   CHOL 201 (H) 04/26/2019   HDL 50 04/26/2019   LDLCALC 130 (H) 04/26/2019   TRIG 120 04/26/2019   CHOLHDL 6.0 (H) 10/01/2017   Lab Results  Component Value Date   TSH 0.763 07/01/2017   Lab Results  Component Value Date   HGBA1C 6.1 11/17/2019   Lab Results  Component Value Date   WBC 7.4 07/01/2017   HGB 13.1 07/01/2017   HCT 40.8 07/01/2017   MCV 74 (L) 07/01/2017  PLT 374 07/01/2017   Lab Results  Component Value Date   ALT 15 07/01/2017   AST 15 07/01/2017    ALKPHOS 98 07/01/2017   BILITOT 0.2 07/01/2017     Review of Systems  Constitutional: Negative.  Negative for chills, fatigue, fever, malaise/fatigue, unexpected weight change and weight loss.  HENT: Negative for congestion, ear discharge, ear pain, rhinorrhea, sinus pressure, sneezing and sore throat.   Eyes: Negative for blurred vision, photophobia, pain, discharge, redness and itching.  Respiratory: Negative for cough, shortness of breath, wheezing and stridor.   Cardiovascular: Negative for chest pain, orthopnea and PND.  Gastrointestinal: Negative for abdominal pain, blood in stool, constipation, diarrhea, nausea and vomiting.  Endocrine: Negative for cold intolerance, heat intolerance, polydipsia, polyphagia and polyuria.  Genitourinary: Negative for dysuria, flank pain, frequency, hematuria, menstrual problem, pelvic pain, urgency, vaginal bleeding and vaginal discharge.  Musculoskeletal: Negative for arthralgias, back pain, myalgias and neck pain.  Skin: Negative for rash.  Allergic/Immunologic: Negative for environmental allergies and food allergies.  Neurological: Negative for dizziness, focal weakness, weakness, light-headedness, numbness and headaches.  Hematological: Negative for adenopathy. Does not bruise/bleed easily.  Psychiatric/Behavioral: Negative for dysphoric mood. The patient is not nervous/anxious.     Patient Active Problem List   Diagnosis Date Noted  . Statin intolerance 09/23/2019  . Multinodular goiter 04/16/2018  . Mixed diabetic hyperlipidemia associated with type 2 diabetes mellitus ([place]) 11/10/2017  . Tobacco dependence 11/10/2017  . Microcytosis 11/17/2016  . Depression, major, single episode, complete remission ([place]) 11/17/2016  . Insomnia 07/22/2016  . Hyperlipidemia 07/22/2016  . Type 2 diabetes mellitus with hyperglycemia, without long-term current use of insulin ([place]) 06/24/2016  . Dental abscess 08/27/2015  . Smoker 11/04/2013  . Goiter  01/26/2012  . Hypertension associated with diabetes ([place]) 12/17/2009    Allergies  Allergen Reactions  . Atorvastatin Other (See Comments)    Myalgias  . Pravastatin Sodium Nausea Only    Past Surgical History:  Procedure Laterality Date  . ABDOMINAL HYSTERECTOMY    . HYSTEROTOMY      Social History   Tobacco Use  . Smoking status: Current Every Day Smoker    Packs/day: 0.50    Types: Cigarettes  . Smokeless tobacco: Never Used  Vaping Use  . Vaping Use: Never used  Substance Use Topics  . Alcohol use: No  . Drug use: No     Medication list has been reviewed and updated.  Current Meds  Medication Sig  . ACCU-CHEK AVIVA PLUS test strip 1 each daily.  . Accu-Chek Softclix Lancets lancets   . ezetimibe (ZETIA) 10 MG tablet Take 1 tablet by mouth once daily  . glipiZIDE (GLUCOTROL) 5 MG tablet Take 1 tablet (5 mg total) by mouth 2 (two) times daily before a meal.  . latanoprost (XALATAN) 0.005 % ophthalmic solution 1 drop at bedtime.  Marland Kitchen lisinopril (ZESTRIL) 40 MG tablet Take 1 tablet (40 mg total) by mouth daily.  . metFORMIN (GLUCOPHAGE) 1000 MG tablet Take 1 tablet (1,000 mg total) by mouth 2 (two) times daily.  . methimazole (TAPAZOLE) 5 MG tablet Take 0.5 tablets by mouth daily. [employer]  . metoprolol succinate (TOPROL-XL) 25 MG 24 hr tablet Take 1 tablet (25 mg total) by mouth daily.  . Multiple Vitamin (MULTIVITAMIN) capsule Take 1 capsule by mouth daily.  . pioglitazone (ACTOS) 15 MG tablet Take 1 tablet (15 mg total) by mouth daily.    PHQ 2/9 Scores 02/09/2020 08/09/2019 08/01/2019 07/19/2019  PHQ -  2 Score 0 0 0 0  PHQ- 9 Score 0 0 - 0    GAD 7 : Generalized Anxiety Score 02/09/2020 08/09/2019 07/19/2019 03/15/2019  Nervous, Anxious, on Edge 0 0 0 0  Control/stop worrying 0 0 0 0  Worry too much - different things 0 0 0 0  Trouble relaxing 0 0 0 0  Restless 0 0 0 0  Easily annoyed or irritable 0 0 0 0  Afraid - awful might happen 0 0 0 0  Total  GAD 7 Score 0 0 0 0    BP Readings from Last 3 Encounters:  02/09/20 (!) 150/100  08/09/19 130/70  07/19/19 (!) 150/80    Physical Exam Vitals and nursing note reviewed.  Constitutional:      Appearance: She is well-developed.  HENT:     Head: Normocephalic.     Jaw: There is normal jaw occlusion.     Right Ear: Hearing, tympanic membrane, ear canal and external ear normal.     Left Ear: Hearing, tympanic membrane, ear canal and external ear normal.     Nose: Nose normal.     Mouth/Throat:     Lips: Pink.     Mouth: Mucous membranes are moist.     Dentition: Normal dentition. Does not have dentures. No dental tenderness, gingival swelling, dental caries, dental abscesses or gum lesions.     Pharynx: Oropharynx is clear.  Eyes:     General: Lids are everted, no foreign bodies appreciated. No scleral icterus.       Left eye: No foreign body or hordeolum.     Conjunctiva/sclera: Conjunctivae normal.     Right eye: Right conjunctiva is not injected.     Left eye: Left conjunctiva is not injected.     Pupils: Pupils are equal, round, and reactive to light.  Neck:     Thyroid: No thyromegaly.     Vascular: No JVD.     Trachea: No tracheal deviation.  Cardiovascular:     Rate and Rhythm: Normal rate and regular rhythm.     Heart sounds: Normal heart sounds. No murmur heard.  No friction rub. No gallop.   Pulmonary:     Effort: Pulmonary effort is normal. No respiratory distress.     Breath sounds: Normal breath sounds. No wheezing, rhonchi or rales.  Abdominal:     General: Bowel sounds are normal.     Palpations: Abdomen is soft. There is no mass.     Tenderness: There is no abdominal tenderness. There is no guarding or rebound.  Musculoskeletal:        General: No tenderness. Normal range of motion.     Cervical back: Normal range of motion and neck supple.  Lymphadenopathy:     Cervical: No cervical adenopathy.  Skin:    General: Skin is warm.     Findings: No rash.    Neurological:     Mental Status: She is alert and oriented to person, place, and time.     Cranial Nerves: No cranial nerve deficit.     Deep Tendon Reflexes: Reflexes normal.  Psychiatric:        Mood and Affect: Mood is not anxious or depressed.     Wt Readings from Last 3 Encounters:  02/09/20 131 lb (59.4 kg)  08/09/19 130 lb (59 kg)  07/19/19 130 lb (59 kg)    BP (!) 150/100   Pulse 80   Ht 5\' 2"  (1.575 m)   Wt 131  lb (59.4 kg)   BMI 23.96 kg/m   Assessment and Plan: 1. Essential hypertension Chronic.  Uncontrolled.  Stable.  Blood pressure is elevated to 150/100.  In addition to continuance of lisinopril 40 mg we will increase metoprolol XL to 50 mg a day.  We will recheck blood pressure in 6 weeks.  We will do labs at that time. - metoprolol succinate (TOPROL-XL) 50 MG 24 hr tablet; Take 1 tablet (50 mg total) by mouth daily. Take with or immediately following a meal.  Dispense: 60 tablet; Refill: 1 - lisinopril (ZESTRIL) 40 MG tablet; Take 1 tablet (40 mg total) by mouth daily.  Dispense: 90 tablet; Refill: 1  2. Hyperlipidemia, familial, high LDL Chronic.  Uncontrolled.  Stable.  Patient has been unable to take statins for various reasons but we have convinced her to take Zetia which has no statin whatsoever and and patient will agree to this.  We will initiate Zetia 10 mg 1 a day.  Upon return in 6 weeks for blood pressure check we will do a lipid check at that time. - ezetimibe (ZETIA) 10 MG tablet; Take 1 tablet (10 mg total) by mouth daily.  Dispense: 90 tablet; Refill: 1  3. Diabetes mellitus due to underlying condition, uncontrolled, with hyperglycemia ([place]) Chronic.  Controlled.  Stable.  Patient is followed by Malissa Hippo and is on triple therapy of episodic 5 mg, Metformin 1 g twice a day, and pioglitazone 15 mg daily.  4. Tobacco dependence Patient continues to smoke for unknown reason we have had a long discussion again about quitting.Patient has been  advised of the health risks of smoking and counseled concerning cessation of tobacco products. I spent over 3 minutes for discussion and to answer questions.  Patient has been given information on quitting again.  5. Need for immunization against influenza Discussed and administered. - Flu Vaccine QUAD High Dose(Fluad)

## 2020-02-21 DIAGNOSIS — E119 Type 2 diabetes mellitus without complications: Secondary | ICD-10-CM | POA: Diagnosis not present

## 2020-02-21 DIAGNOSIS — H401131 Primary open-angle glaucoma, bilateral, mild stage: Secondary | ICD-10-CM | POA: Diagnosis not present

## 2020-02-21 DIAGNOSIS — H2513 Age-related nuclear cataract, bilateral: Secondary | ICD-10-CM | POA: Diagnosis not present

## 2020-03-09 DIAGNOSIS — G720 Drug-induced myopathy: Secondary | ICD-10-CM | POA: Insufficient documentation

## 2020-04-05 ENCOUNTER — Other Ambulatory Visit: Payer: Self-pay

## 2020-04-05 ENCOUNTER — Ambulatory Visit (INDEPENDENT_AMBULATORY_CARE_PROVIDER_SITE_OTHER): Payer: Medicare Other | Admitting: Family Medicine

## 2020-04-05 ENCOUNTER — Encounter: Payer: Self-pay | Admitting: Family Medicine

## 2020-04-05 DIAGNOSIS — I10 Essential (primary) hypertension: Secondary | ICD-10-CM

## 2020-04-05 MED ORDER — METOPROLOL SUCCINATE ER 50 MG PO TB24: 50.0000 mg | ORAL_TABLET | Freq: Every day | ORAL | 1 refills | Status: DC

## 2020-04-05 NOTE — Progress Notes (Signed)
Date:  04/05/2020   Name:  Ann Chaney   DOB:  [birth year]   MRN:  [id number]   Chief Complaint: Hypertension (Recheck from last visit- added metoprolol)  Hypertension This is a chronic problem. The current episode started more than 1 year ago. The problem has been gradually improving since onset. The problem is controlled. Pertinent negatives include no anxiety, blurred vision, chest pain, headaches, malaise/fatigue, neck pain, orthopnea, palpitations, peripheral edema, PND, shortness of breath or sweats. There are no associated agents to hypertension. There are no known risk factors for coronary artery disease.    Lab Results  Component Value Date   CREATININE 0.77 04/26/2019   BUN 10 04/26/2019   NA 142 04/26/2019   K 4.6 04/26/2019   CL 105 04/26/2019   CO2 22 04/26/2019   Lab Results  Component Value Date   CHOL 201 (H) 04/26/2019   HDL 50 04/26/2019   LDLCALC 130 (H) 04/26/2019   TRIG 120 04/26/2019   CHOLHDL 6.0 (H) 10/01/2017   Lab Results  Component Value Date   TSH 0.763 07/01/2017   Lab Results  Component Value Date   HGBA1C 6.1 11/17/2019   Lab Results  Component Value Date   WBC 7.4 07/01/2017   HGB 13.1 07/01/2017   HCT 40.8 07/01/2017   MCV 74 (L) 07/01/2017   PLT 374 07/01/2017   Lab Results  Component Value Date   ALT 15 07/01/2017   AST 15 07/01/2017   ALKPHOS 98 07/01/2017   BILITOT 0.2 07/01/2017     Review of Systems  Constitutional: Negative for malaise/fatigue.  Eyes: Negative for blurred vision.  Respiratory: Negative for shortness of breath.   Cardiovascular: Negative for chest pain, palpitations, orthopnea and PND.  Musculoskeletal: Negative for neck pain.  Neurological: Negative for headaches.    Patient Active Problem List   Diagnosis Date Noted  . Statin myopathy 03/09/2020  . Statin intolerance 09/23/2019  . Multinodular goiter 04/16/2018  . Mixed diabetic hyperlipidemia associated with type 2 diabetes mellitus  ([place]) 11/10/2017  . Tobacco dependence 11/10/2017  . Microcytosis 11/17/2016  . Depression, major, single episode, complete remission ([place]) 11/17/2016  . Insomnia 07/22/2016  . Hyperlipidemia 07/22/2016  . Type 2 diabetes mellitus with hyperglycemia, without long-term current use of insulin ([place]) 06/24/2016  . Dental abscess 08/27/2015  . Smoker 11/04/2013  . Goiter 01/26/2012  . Hypertension associated with diabetes ([place]) 12/17/2009    Allergies  Allergen Reactions  . Atorvastatin Other (See Comments)    Myalgias  . Pravastatin Sodium Nausea Only    Past Surgical History:  Procedure Laterality Date  . ABDOMINAL HYSTERECTOMY    . HYSTEROTOMY      Social History   Tobacco Use  . Smoking status: Current Every Day Smoker    Packs/day: 0.50    Types: Cigarettes  . Smokeless tobacco: Never Used  Vaping Use  . Vaping Use: Never used  Substance Use Topics  . Alcohol use: No  . Drug use: No     Medication list has been reviewed and updated.  Current Meds  Medication Sig  . ACCU-CHEK AVIVA PLUS test strip 1 each daily.  . Accu-Chek Softclix Lancets lancets   . ezetimibe (ZETIA) 10 MG tablet Take 1 tablet (10 mg total) by mouth daily.  Marland Kitchen glipiZIDE (GLUCOTROL) 5 MG tablet Take 1 tablet (5 mg total) by mouth 2 (two) times daily before a meal.  . latanoprost (XALATAN) 0.005 % ophthalmic solution 1 drop at bedtime.  Marland Kitchen  lisinopril (ZESTRIL) 40 MG tablet Take 1 tablet (40 mg total) by mouth daily.  . metFORMIN (GLUCOPHAGE) 1000 MG tablet Take 1 tablet (1,000 mg total) by mouth 2 (two) times daily.  . methimazole (TAPAZOLE) 5 MG tablet Take 0.5 tablets by mouth daily. [employer]  . metoprolol succinate (TOPROL-XL) 50 MG 24 hr tablet Take 1 tablet (50 mg total) by mouth daily. Take with or immediately following a meal.  . Multiple Vitamin (MULTIVITAMIN) capsule Take 1 capsule by mouth daily.  . pioglitazone (ACTOS) 15 MG tablet Take 1 tablet (15 mg total) by mouth daily.     PHQ 2/9 Scores 04/05/2020 02/09/2020 08/09/2019 08/01/2019  PHQ - 2 Score 0 0 0 0  PHQ- 9 Score 0 0 0 -    GAD 7 : Generalized Anxiety Score 02/09/2020 08/09/2019 07/19/2019 03/15/2019  Nervous, Anxious, on Edge 0 0 0 0  Control/stop worrying 0 0 0 0  Worry too much - different things 0 0 0 0  Trouble relaxing 0 0 0 0  Restless 0 0 0 0  Easily annoyed or irritable 0 0 0 0  Afraid - awful might happen 0 0 0 0  Total GAD 7 Score 0 0 0 0    BP Readings from Last 3 Encounters:  04/05/20 130/80  02/09/20 (!) 150/100  08/09/19 130/70    Physical Exam  Wt Readings from Last 3 Encounters:  04/05/20 131 lb (59.4 kg)  02/09/20 131 lb (59.4 kg)  08/09/19 130 lb (59 kg)    BP 130/80   Pulse 68   Ht 5\' 2"  (1.575 m)   Wt 131 lb (59.4 kg)   BMI 23.96 kg/m   Assessment and Plan:  1. Essential hypertension Chronic.  Controlled.  Stable.  Blood pressure today is 130/80.  Patient is currently controlled on lisinopril 40 mg as well as Toprol XL 50 mg once a day.  Patient will return in 4 months for her regular check and refills on her hyperlipidemia and diabetic regimen. - metoprolol succinate (TOPROL-XL) 50 MG 24 hr tablet; Take 1 tablet (50 mg total) by mouth daily. Take with or immediately following a meal.  Dispense: 180 tablet; Refill: 1

## 2020-05-08 DIAGNOSIS — I152 Hypertension secondary to endocrine disorders: Secondary | ICD-10-CM | POA: Diagnosis not present

## 2020-05-08 DIAGNOSIS — E1159 Type 2 diabetes mellitus with other circulatory complications: Secondary | ICD-10-CM | POA: Diagnosis not present

## 2020-05-08 DIAGNOSIS — F17200 Nicotine dependence, unspecified, uncomplicated: Secondary | ICD-10-CM | POA: Diagnosis not present

## 2020-05-08 DIAGNOSIS — E782 Mixed hyperlipidemia: Secondary | ICD-10-CM | POA: Diagnosis not present

## 2020-05-08 DIAGNOSIS — E0500 Thyrotoxicosis with diffuse goiter without thyrotoxic crisis or storm: Secondary | ICD-10-CM | POA: Diagnosis not present

## 2020-05-08 DIAGNOSIS — E1169 Type 2 diabetes mellitus with other specified complication: Secondary | ICD-10-CM | POA: Diagnosis not present

## 2020-05-08 LAB — HEMOGLOBIN A1C: Hemoglobin A1C: 5.7

## 2020-06-19 DIAGNOSIS — H2513 Age-related nuclear cataract, bilateral: Secondary | ICD-10-CM | POA: Diagnosis not present

## 2020-06-19 DIAGNOSIS — H401131 Primary open-angle glaucoma, bilateral, mild stage: Secondary | ICD-10-CM | POA: Diagnosis not present

## 2020-06-19 DIAGNOSIS — E119 Type 2 diabetes mellitus without complications: Secondary | ICD-10-CM | POA: Diagnosis not present

## 2020-07-16 ENCOUNTER — Other Ambulatory Visit: Payer: Self-pay

## 2020-07-16 ENCOUNTER — Ambulatory Visit (INDEPENDENT_AMBULATORY_CARE_PROVIDER_SITE_OTHER): Payer: Medicare Other | Admitting: Family Medicine

## 2020-07-16 ENCOUNTER — Encounter: Payer: Self-pay | Admitting: Family Medicine

## 2020-07-16 ENCOUNTER — Telehealth: Payer: Self-pay

## 2020-07-16 VITALS — BP 130/80 | HR 60 | Ht 62.0 in | Wt 127.0 lb

## 2020-07-16 DIAGNOSIS — E7849 Other hyperlipidemia: Secondary | ICD-10-CM | POA: Diagnosis not present

## 2020-07-16 DIAGNOSIS — F17200 Nicotine dependence, unspecified, uncomplicated: Secondary | ICD-10-CM | POA: Diagnosis not present

## 2020-07-16 DIAGNOSIS — M7912 Myalgia of auxiliary muscles, head and neck: Secondary | ICD-10-CM

## 2020-07-16 DIAGNOSIS — R269 Unspecified abnormalities of gait and mobility: Secondary | ICD-10-CM

## 2020-07-16 DIAGNOSIS — E1165 Type 2 diabetes mellitus with hyperglycemia: Secondary | ICD-10-CM | POA: Diagnosis not present

## 2020-07-16 DIAGNOSIS — R4701 Aphasia: Secondary | ICD-10-CM | POA: Diagnosis not present

## 2020-07-16 DIAGNOSIS — R4189 Other symptoms and signs involving cognitive functions and awareness: Secondary | ICD-10-CM

## 2020-07-16 DIAGNOSIS — I10 Essential (primary) hypertension: Secondary | ICD-10-CM

## 2020-07-16 NOTE — Telephone Encounter (Signed)
Copied from [place] [phone]. Topic: Appointment Scheduling - Scheduling Inquiry for Clinic >> Jul 16, 2020 11:20 AM Tessa Lerner A wrote: Reason for CRM: Patient has an appointment to be seen 07/16/20 at 3:40  Patient's daughter would also like to attend to provide additional information and have questions answered related to the visit  Patient's daughter has concerns related to the patient's memory and ability to self advocate  Please contact to further advise when possible

## 2020-07-16 NOTE — Progress Notes (Signed)
Date:  07/16/2020   Name:  Ann Chaney   DOB:  1945-12-15   MRN:  [id number]   Chief Complaint: Neck Pain (L) side- comes and goes- behind ear. Does not radiate. ) and Memory Loss (Noticed a couple of months)  Neck Pain  This is a new problem. The current episode started more than 1 month ago. The problem occurs intermittently. The problem has been waxing and waning. The pain is associated with nothing. The pain is mild. The symptoms are aggravated by twisting. Pertinent negatives include no chest pain, fever, headaches, leg pain, numbness, paresis, photophobia, syncope, tingling, trouble swallowing, visual change, weakness or weight loss. The treatment provided mild relief.  Dizziness This is a new problem. The current episode started in the past 7 days. The problem occurs intermittently. Associated symptoms include neck pain and vertigo. Pertinent negatives include no chest pain, chills, coughing, diaphoresis, fatigue, fever, headaches, myalgias, nausea, numbness, sore throat, visual change or weakness. The symptoms are aggravated by bending. The treatment provided mild relief.  Neurologic Problem The patient's primary symptoms include a loss of balance and memory loss. The patient's pertinent negatives include no altered mental status, clumsiness, focal sensory loss, focal weakness, near-syncope, slurred speech, syncope, visual change or weakness. This is a chronic problem. Associated symptoms include dizziness, neck pain and vertigo. Pertinent negatives include no chest pain, diaphoresis, fatigue, fever, headaches or nausea.    Lab Results  Component Value Date   CREATININE 0.77 04/26/2019   BUN 10 04/26/2019   NA 142 04/26/2019   K 4.6 04/26/2019   CL 105 04/26/2019   CO2 22 04/26/2019   Lab Results  Component Value Date   CHOL 201 (H) 04/26/2019   HDL 50 04/26/2019   LDLCALC 130 (H) 04/26/2019   TRIG 120 04/26/2019   CHOLHDL 6.0 (H) 10/01/2017   Lab Results  Component  Value Date   TSH 0.763 07/01/2017   Lab Results  Component Value Date   HGBA1C 5.7 05/08/2020   Lab Results  Component Value Date   WBC 7.4 07/01/2017   HGB 13.1 07/01/2017   HCT 40.8 07/01/2017   MCV 74 (L) 07/01/2017   PLT 374 07/01/2017   Lab Results  Component Value Date   ALT 15 07/01/2017   AST 15 07/01/2017   ALKPHOS 98 07/01/2017   BILITOT 0.2 07/01/2017     Review of Systems  Constitutional: Negative for chills, diaphoresis, fatigue, fever and weight loss.  HENT: Negative for sore throat and trouble swallowing.   Eyes: Negative for photophobia.  Respiratory: Negative for cough.   Cardiovascular: Negative for chest pain, syncope and near-syncope.  Gastrointestinal: Negative for nausea.  Musculoskeletal: Positive for neck pain. Negative for myalgias.  Neurological: Positive for dizziness, vertigo and loss of balance. Negative for tingling, focal weakness, syncope, weakness, numbness and headaches.  Psychiatric/Behavioral: Positive for memory loss.    Patient Active Problem List   Diagnosis Date Noted  . Statin myopathy 03/09/2020  . Statin intolerance 09/23/2019  . Multinodular goiter 04/16/2018  . Mixed diabetic hyperlipidemia associated with type 2 diabetes mellitus ([place]) 11/10/2017  . Tobacco dependence 11/10/2017  . Microcytosis 11/17/2016  . Depression, major, single episode, complete remission ([place]) 11/17/2016  . Insomnia 07/22/2016  . Hyperlipidemia 07/22/2016  . Type 2 diabetes mellitus with hyperglycemia, without long-term current use of insulin ([place]) 06/24/2016  . Dental abscess 08/27/2015  . Smoker 11/04/2013  . Goiter 01/26/2012  . Hypertension associated with diabetes ([place]) 12/17/2009  Allergies  Allergen Reactions  . Atorvastatin Other (See Comments)    Myalgias  . Pravastatin Sodium Nausea Only    Past Surgical History:  Procedure Laterality Date  . ABDOMINAL HYSTERECTOMY    . HYSTEROTOMY      Social History   Tobacco Use   . Smoking status: Current Every Day Smoker    Packs/day: 0.50    Types: Cigarettes  . Smokeless tobacco: Never Used  Vaping Use  . Vaping Use: Never used  Substance Use Topics  . Alcohol use: No  . Drug use: No     Medication list has been reviewed and updated.  Current Meds  Medication Sig  . ACCU-CHEK AVIVA PLUS test strip 1 each daily.  . Accu-Chek Softclix Lancets lancets   . ezetimibe (ZETIA) 10 MG tablet Take 1 tablet (10 mg total) by mouth daily.  Marland Kitchen glipiZIDE (GLUCOTROL) 5 MG tablet Take 1 tablet (5 mg total) by mouth 2 (two) times daily before a meal.  . latanoprost (XALATAN) 0.005 % ophthalmic solution 1 drop at bedtime.  Marland Kitchen lisinopril (ZESTRIL) 40 MG tablet Take 1 tablet (40 mg total) by mouth daily.  . metFORMIN (GLUCOPHAGE) 1000 MG tablet Take 1 tablet (1,000 mg total) by mouth 2 (two) times daily.  . methimazole (TAPAZOLE) 5 MG tablet Take 0.5 tablets by mouth daily. [employer]  . metoprolol succinate (TOPROL-XL) 50 MG 24 hr tablet Take 1 tablet (50 mg total) by mouth daily. Take with or immediately following a meal.  . Multiple Vitamin (MULTIVITAMIN) capsule Take 1 capsule by mouth daily.  . pioglitazone (ACTOS) 15 MG tablet Take 1 tablet (15 mg total) by mouth daily.    PHQ 2/9 Scores 04/05/2020 02/09/2020 08/09/2019 08/01/2019  PHQ - 2 Score 0 0 0 0  PHQ- 9 Score 0 0 0 -    GAD 7 : Generalized Anxiety Score 02/09/2020 08/09/2019 07/19/2019 03/15/2019  Nervous, Anxious, on Edge 0 0 0 0  Control/stop worrying 0 0 0 0  Worry too much - different things 0 0 0 0  Trouble relaxing 0 0 0 0  Restless 0 0 0 0  Easily annoyed or irritable 0 0 0 0  Afraid - awful might happen 0 0 0 0  Total GAD 7 Score 0 0 0 0    BP Readings from Last 3 Encounters:  07/16/20 130/80  04/05/20 130/80  02/09/20 (!) 150/100    Physical Exam Vitals and nursing note reviewed.  Constitutional:      Appearance: She is well-developed.  HENT:     Head: Normocephalic.     Right  Ear: Tympanic membrane and external ear normal.     Left Ear: Tympanic membrane and external ear normal.     Nose: Nose normal.     Mouth/Throat:     Mouth: Mucous membranes are moist.  Eyes:     General: Lids are everted, no foreign bodies appreciated. No scleral icterus.       Left eye: No foreign body or hordeolum.     Conjunctiva/sclera: Conjunctivae normal.     Right eye: Right conjunctiva is not injected.     Left eye: Left conjunctiva is not injected.     Pupils: Pupils are equal, round, and reactive to light.  Neck:     Thyroid: No thyromegaly.     Vascular: No JVD.     Trachea: No tracheal deviation.  Cardiovascular:     Rate and Rhythm: Normal rate and regular rhythm.  Heart sounds: Normal heart sounds. No murmur heard. No friction rub. No gallop.   Pulmonary:     Effort: Pulmonary effort is normal. No respiratory distress.     Breath sounds: Normal breath sounds. No wheezing or rales.  Abdominal:     General: Bowel sounds are normal.     Palpations: Abdomen is soft. There is no mass.     Tenderness: There is no abdominal tenderness. There is no guarding or rebound.  Musculoskeletal:        General: No tenderness. Normal range of motion.     Cervical back: Normal range of motion and neck supple.  Lymphadenopathy:     Cervical: No cervical adenopathy.  Skin:    General: Skin is warm.     Findings: No rash.  Neurological:     Mental Status: She is alert and oriented to person, place, and time.     Cranial Nerves: No cranial nerve deficit.     Deep Tendon Reflexes: Reflexes normal.  Psychiatric:        Mood and Affect: Mood is not anxious or depressed.     Wt Readings from Last 3 Encounters:  07/16/20 127 lb (57.6 kg)  04/05/20 131 lb (59.4 kg)  02/09/20 131 lb (59.4 kg)    BP 130/80   Pulse 60   Ht 5\' 2"  (1.575 m)   Wt 127 lb (57.6 kg)   BMI 23.23 kg/m   Assessment and Plan: 1. Cognitive change New onset.  Persistent.  Patient had a CIT score of 8  with a mini cog of 2.  I think this is more of a cognitive change than a memory issue because she could remember what she had for breakfast immediately as well as what she had the night previous.  When patient tried to draw clock she had to be prompted by the numbers in the 12 6 3  and 9:00 spaces.  But was unable to drawl properly and 11:10 clocking.  We will refer to neurology for further evaluation. - Ambulatory referral to Neurology  2. Expressive aphasia Patient when went to a restaurant was picked at which she wanted and on the restaurant but when it came time to order she could not remember or express what she wanted from the menu.  It was as if she knew what she wanted to say but she could not say it.  This is a new presentation and we will refer to neurology for evaluation. - Ambulatory referral to Neurology  3. Gait difficulty Is also been noted by the daughter that the patient's had some change in her ambulation and that she may be a little more wider base and stooped and taking smaller steps. - Ambulatory referral to Neurology  4. Sternocleidomastoid muscle tenderness Has had off-and-on discomfort of her left sternocleido mastoid muscle with tenderness over the origin of the muscle.  There is some discomfort with rotation of the neck.  5. Hyperlipidemia, familial, high LDL Chronic.  Controlled.  Stable.  Review of lipid panel was normal from previous we will repeat a lipid panel but in a fasting state tomorrow. - Lipid Panel With LDL/HDL Ratio  6. Type 2 diabetes mellitus with hyperglycemia, without long-term current use of insulin ([facility]) Chronic.  Controlled.  Stable.  Last A1c was 5.7 this is controlled well and we will check a renal function panel for follow-up. - Lipid Panel With LDL/HDL Ratio - Renal Function Panel  7. Essential hypertension Chronic.  Controlled.  Stable.  Blood pressure today is 130/80.  We will check a new renal panel tomorrow. - Renal Function Panel  8.  Tobacco dependence Patient has been advised of the health risks of smoking and counseled concerning cessation of tobacco products. I spent over 3 minutes for discussion and to answer questions.

## 2020-07-17 DIAGNOSIS — E7849 Other hyperlipidemia: Secondary | ICD-10-CM | POA: Diagnosis not present

## 2020-07-17 DIAGNOSIS — E1165 Type 2 diabetes mellitus with hyperglycemia: Secondary | ICD-10-CM | POA: Diagnosis not present

## 2020-07-17 DIAGNOSIS — I10 Essential (primary) hypertension: Secondary | ICD-10-CM | POA: Diagnosis not present

## 2020-07-18 ENCOUNTER — Other Ambulatory Visit: Payer: Self-pay

## 2020-07-18 DIAGNOSIS — E7849 Other hyperlipidemia: Secondary | ICD-10-CM

## 2020-07-18 LAB — RENAL FUNCTION PANEL
Albumin: 4.3 g/dL (ref 3.7–4.7)
BUN/Creatinine Ratio: 18 (ref 12–28)
BUN: 16 mg/dL (ref 8–27)
CO2: 22 mmol/L (ref 20–29)
Calcium: 10 mg/dL (ref 8.7–10.3)
Chloride: 105 mmol/L (ref 96–106)
Creatinine, Ser: 0.9 mg/dL (ref 0.57–1.00)
Glucose: 128 mg/dL — ABNORMAL HIGH (ref 65–99)
Phosphorus: 3.6 mg/dL (ref 3.0–4.3)
Potassium: 4.4 mmol/L (ref 3.5–5.2)
Sodium: 142 mmol/L (ref 134–144)
eGFR: 68 mL/min/{1.73_m2} (ref >59)

## 2020-07-18 LAB — LIPID PANEL WITH LDL/HDL RATIO
Cholesterol, Total: 228 mg/dL — ABNORMAL HIGH (ref 100–199)
HDL: 45 mg/dL (ref >39)
LDL Chol Calc (NIH): 162 mg/dL — ABNORMAL HIGH (ref 0–99)
LDL/HDL Ratio: 3.6 ratio — ABNORMAL HIGH (ref 0.0–3.2)
Triglycerides: 117 mg/dL (ref 0–149)
VLDL Cholesterol Cal: 21 mg/dL (ref 5–40)

## 2020-07-18 MED ORDER — EZETIMIBE 10 MG PO TABS: 10.0000 mg | ORAL_TABLET | Freq: Every day | ORAL | 1 refills | Status: DC

## 2020-07-20 ENCOUNTER — Telehealth: Payer: Self-pay

## 2020-07-20 NOTE — Telephone Encounter (Signed)
Copied from [place] [id number]. Topic: General - Other >> Jul 20, 2020  2:46 PM Keene Breath wrote: Reason for CRM: Patient's daughter called to inform Baxter Flattery that they have not heard anything about the referral that the doctor sent in.  She stated Baxter Flattery wanted her to contact the doctor if there was no word.  Please advise and call to discuss at [phone]

## 2020-07-23 NOTE — Telephone Encounter (Signed)
Have forwarded message to Raeford Razor

## 2020-07-26 ENCOUNTER — Other Ambulatory Visit: Payer: Self-pay

## 2020-07-26 ENCOUNTER — Encounter: Payer: Self-pay | Admitting: Family Medicine

## 2020-07-26 ENCOUNTER — Ambulatory Visit (INDEPENDENT_AMBULATORY_CARE_PROVIDER_SITE_OTHER): Payer: Medicare Other | Admitting: Family Medicine

## 2020-07-26 VITALS — BP 130/80 | HR 60 | Ht 62.0 in | Wt 128.0 lb

## 2020-07-26 DIAGNOSIS — G720 Drug-induced myopathy: Secondary | ICD-10-CM

## 2020-07-26 DIAGNOSIS — F17200 Nicotine dependence, unspecified, uncomplicated: Secondary | ICD-10-CM | POA: Diagnosis not present

## 2020-07-26 DIAGNOSIS — R4189 Other symptoms and signs involving cognitive functions and awareness: Secondary | ICD-10-CM | POA: Diagnosis not present

## 2020-07-26 DIAGNOSIS — E7849 Other hyperlipidemia: Secondary | ICD-10-CM

## 2020-07-26 DIAGNOSIS — R351 Nocturia: Secondary | ICD-10-CM

## 2020-07-26 DIAGNOSIS — E1165 Type 2 diabetes mellitus with hyperglycemia: Secondary | ICD-10-CM | POA: Diagnosis not present

## 2020-07-26 DIAGNOSIS — T466X5A Adverse effect of antihyperlipidemic and antiarteriosclerotic drugs, initial encounter: Secondary | ICD-10-CM
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
## 2020-07-27 LAB — HEMOGLOBIN A1C
Est. average glucose Bld gHb Est-mCnc: 137 mg/dL
Hgb A1c MFr Bld: 6.4 % — ABNORMAL HIGH (ref 4.8–5.6)

## 2020-08-01 ENCOUNTER — Ambulatory Visit: Payer: Medicare Other

## 2020-08-08 ENCOUNTER — Ambulatory Visit (INDEPENDENT_AMBULATORY_CARE_PROVIDER_SITE_OTHER): Payer: Medicare Other

## 2020-08-08 DIAGNOSIS — Z780 Asymptomatic menopausal state: Secondary | ICD-10-CM | POA: Diagnosis not present

## 2020-08-08 DIAGNOSIS — Z0000 Encounter for general adult medical examination without abnormal findings: Secondary | ICD-10-CM | POA: Diagnosis not present

## 2020-08-08 DIAGNOSIS — Z1231 Encounter for screening mammogram for malignant neoplasm of breast: Secondary | ICD-10-CM

## 2020-08-08 NOTE — Progress Notes (Signed)
Subjective:   Ann Chaney is a 75 y.o. female who presents for Medicare Annual (Subsequent) preventive examination.  Virtual Visit via Telephone Note  I connected with  Ann Chaney on 08/08/20 at 10:40 AM EDT by telephone and verified that I am speaking with the correct person using two identifiers.  Location: Patient: home Provider: [facility] Persons participating in the virtual visit: [place]   I discussed the limitations, risks, security and privacy concerns of performing an evaluation and management service by telephone and the availability of in person appointments. The patient expressed understanding and agreed to proceed.  Interactive audio and video telecommunications were attempted between this nurse and patient, however failed, due to patient having technical difficulties OR patient did not have access to video capability.  We continued and completed visit with audio only.  Some vital signs may be absent or patient reported.   Clemetine Marker, LPN    Review of Systems     Cardiac Risk Factors include: advanced age (>60men, >58 women);diabetes mellitus;dyslipidemia;hypertension;smoking/ tobacco exposure     Objective:    There were no vitals filed for this visit. There is no height or weight on file to calculate BMI.  Advanced Directives 08/08/2020 08/01/2019 03/29/2018 12/30/2016 09/29/2016  Does Patient Have a Medical Advance Directive? No No No No No  Would patient like information on creating a medical advance directive? Yes (MAU/Ambulatory/Procedural Areas - Information given) Yes (MAU/Ambulatory/Procedural Areas - Information given) Yes (MAU/Ambulatory/Procedural Areas - Information given) - -    Current Medications (verified) Outpatient Encounter Medications as of 08/08/2020  Medication Sig  . ACCU-CHEK AVIVA PLUS test strip 1 each daily.  . Accu-Chek Softclix Lancets lancets   . ezetimibe (ZETIA) 10 MG tablet Take 1 tablet (10 mg total) by  mouth daily.  Marland Kitchen glipiZIDE (GLUCOTROL) 5 MG tablet Take 1 tablet (5 mg total) by mouth 2 (two) times daily before a meal.  . hydrochlorothiazide (HYDRODIURIL) 25 MG tablet Take 0.5 tablets by mouth 2 (two) times daily.  Marland Kitchen latanoprost (XALATAN) 0.005 % ophthalmic solution 1 drop at bedtime.  Marland Kitchen lisinopril (ZESTRIL) 40 MG tablet Take 1 tablet (40 mg total) by mouth daily.  . metFORMIN (GLUCOPHAGE) 1000 MG tablet Take 1 tablet (1,000 mg total) by mouth 2 (two) times daily.  . methimazole (TAPAZOLE) 5 MG tablet Take 0.5 tablets by mouth daily. [employer]  . metoprolol succinate (TOPROL-XL) 50 MG 24 hr tablet Take 1 tablet (50 mg total) by mouth daily. Take with or immediately following a meal.  . Multiple Vitamin (MULTIVITAMIN) capsule Take 1 capsule by mouth daily.  . pioglitazone (ACTOS) 15 MG tablet Take 1 tablet (15 mg total) by mouth daily.  . timolol (TIMOPTIC) 0.25 % ophthalmic solution 1 drop every morning.   No facility-administered encounter medications on file as of 08/08/2020.    Allergies (verified) Atorvastatin and Pravastatin sodium   History: Past Medical History:  Diagnosis Date  . Diabetes mellitus without complication ([place])   . Glaucoma   . Glaucoma   . Hyperlipidemia   . Hypertension    Past Surgical History:  Procedure Laterality Date  . ABDOMINAL HYSTERECTOMY    . HYSTEROTOMY     Family History  Problem Relation Age of Onset  . Cancer Mother        sternum  . Emphysema Mother   . Diabetes Mother   . Glaucoma Father   . Dementia Father   . Breast cancer Maternal Aunt    Social  History   Socioeconomic History  . Marital status: Divorced    Spouse name: Not on file  . Number of children: 1  . Years of education: Not on file  . Highest education level: Associate degree: academic program  Occupational History  . Occupation: retired  Tobacco Use  . Smoking status: Former Smoker    Packs/day: 0.50    Types: Cigarettes    Quit date: 07/31/2020     Years since quitting: 0.0  . Smokeless tobacco: Never Used  Vaping Use  . Vaping Use: Never used  Substance and Sexual Activity  . Alcohol use: No  . Drug use: No  . Sexual activity: Not Currently  Other Topics Concern  . Not on file  Social History Narrative   Pt lives alone.    Social Determinants of Health   Financial Resource Strain: Low Risk   . Difficulty of Paying Living Expenses: Not very hard  Food Insecurity: No Food Insecurity  . Worried About Charity fundraiser in the Last Year: Never true  . Ran Out of Food in the Last Year: Never true  Transportation Needs: No Transportation Needs  . Lack of Transportation (Medical): No  . Lack of Transportation (Non-Medical): No  Physical Activity: Inactive  . Days of Exercise per Week: 0 days  . Minutes of Exercise per Session: 0 min  Stress: No Stress Concern Present  . Feeling of Stress : Only a little  Social Connections: Moderately Isolated  . Frequency of Communication with Friends and Family: More than three times a week  . Frequency of Social Gatherings with Friends and Family: More than three times a week  . Attends Religious Services: More than 4 times per year  . Active Member of Clubs or Organizations: No  . Attends Archivist Meetings: Never  . Marital Status: Divorced    Tobacco Counseling Counseling given: Not Answered   Clinical Intake:  Pre-visit preparation completed: Yes  Pain : No/denies pain     Nutritional Risks: None Diabetes: Yes CBG done?: No Did pt. bring in CBG monitor from home?: No  How often do you need to have someone help you when you read instructions, pamphlets, or other written materials from your doctor or pharmacy?: 1 - Never  Nutrition Risk Assessment:  Has the patient had any N/V/D within the last 2 months?  No  Does the patient have any non-healing wounds?  No  Has the patient had any unintentional weight loss or weight gain?  No   Diabetes:  Is the  patient diabetic?  Yes  If diabetic, was a CBG obtained today?  No  Did the patient bring in their glucometer from home?  No  How often do you monitor your CBG's? daily.   Financial Strains and Diabetes Management:  Are you having any financial strains with the device, your supplies or your medication? No .  Does the patient want to be seen by Chronic Care Management for management of their diabetes?  No  Would the patient like to be referred to a Nutritionist or for Diabetic Management?  No   Diabetic Exams:  Diabetic Eye Exam: Completed 01/23/20 negative retinopathy.   Diabetic Foot Exam: Completed 11/24/19.    Interpreter Needed?: No  Information entered by :: Clemetine Marker LPN   Activities of Daily Living In your present state of health, do you have any difficulty performing the following activities: 08/08/2020  Hearing? N  Comment declines hearing aids  Vision? N  Difficulty concentrating or making decisions? N  Walking or climbing stairs? N  Dressing or bathing? N  Doing errands, shopping? N  Preparing Food and eating ? N  Using the Toilet? N  In the past six months, have you accidently leaked urine? Y  Do you have problems with loss of bowel control? N  Managing your Medications? N  Managing your Finances? N  Housekeeping or managing your Housekeeping? N  Some recent data might be hidden    Patient Care Team: Juline Patch, MD as PCP - General (Family Medicine)  Indicate any recent Medical Services you may have received from other than [facility] providers in the past year (date may be approximate).     Assessment:   This is a routine wellness examination for Ann Chaney.  Hearing/Vision screen  Hearing Screening   125Hz  250Hz  500Hz  1000Hz  2000Hz  3000Hz  4000Hz  6000Hz  8000Hz   Right ear:           Left ear:           Comments: Pt denies hearing difficulty   Vision Screening Comments: Annual vision screenings done by Dr. Ellin Mayhew  Dietary issues and exercise activities  discussed: Current Exercise Habits: The patient does not participate in regular exercise at present, Exercise limited by: None identified  Goals Addressed   None    Depression Screen PHQ 2/9 Scores 08/08/2020 07/26/2020 04/05/2020 02/09/2020 08/09/2019 08/01/2019 07/19/2019  PHQ - 2 Score 0 1 0 0 0 0 0  PHQ- 9 Score - 6 0 0 0 - 0    Fall Risk Fall Risk  08/08/2020 04/05/2020 02/09/2020 08/09/2019 08/01/2019  Falls in the past year? 0 0 0 0 0  Number falls in past yr: 0 - - - 0  Injury with Fall? 0 - - - 0  Risk for fall due to : No Fall Risks - - - No Fall Risks  Follow up Falls prevention discussed Falls evaluation completed Falls evaluation completed Falls evaluation completed Falls prevention discussed    FALL RISK PREVENTION PERTAINING TO THE HOME:  Any stairs in or around the home? No  If so, are there any without handrails? No  Home free of loose throw rugs in walkways, pet beds, electrical cords, etc? Yes  Adequate lighting in your home to reduce risk of falls? Yes   ASSISTIVE DEVICES UTILIZED TO PREVENT FALLS:  Life alert? No  Use of a cane, walker or w/c? No  Grab bars in the bathroom? No  Shower chair or bench in shower? No  Elevated toilet seat or a handicapped toilet? No   TIMED UP AND GO:  Was the test performed? No . Telephonic visit.   Cognitive Function:     6CIT Screen 07/16/2020 08/01/2019 03/29/2018  What Year? 0 points 0 points 0 points  What month? 0 points 0 points 0 points  What time? 0 points 0 points 0 points  Count back from 20 4 points 0 points 0 points  Months in reverse 4 points 0 points 2 points  Repeat phrase 0 points 0 points 0 points  Total Score 8 0 2    Immunizations Immunization History  Administered Date(s) Administered  . Fluad Quad(high Dose 65+) 02/24/2019, 02/09/2020  . Influenza, High Dose Seasonal PF 12/30/2016, 03/29/2018  . Influenza, Seasonal, Injecte, Preservative Fre 12/11/2011  . Influenza,inj,Quad PF,6+ Mos 12/13/2013,  02/01/2015, 01/24/2016  . PFIZER(Purple Top)SARS-COV-2 Vaccination 05/17/2019, 06/07/2019  . Pneumococcal Conjugate-13 10/07/2013  . Pneumococcal Polysaccharide-23 10/24/2014  . Tdap 09/11/2009  TDAP status: Due, Education has been provided regarding the importance of this vaccine. Advised may receive this vaccine at local pharmacy or Health Dept. Aware to provide a copy of the vaccination record if obtained from local pharmacy or Health Dept. Verbalized acceptance and understanding.  Flu Vaccine status: Up to date  Pneumococcal vaccine status: Up to date  Covid-19 vaccine status: Completed vaccines  Qualifies for Shingles Vaccine? Yes   Zostavax completed No   Shingrix Completed?: No.    Education has been provided regarding the importance of this vaccine. Patient has been advised to call insurance company to determine out of pocket expense if they have not yet received this vaccine. Advised may also receive vaccine at local pharmacy or Health Dept. Verbalized acceptance and understanding.  Screening Tests Health Maintenance  Topic Date Due  . COVID-19 Vaccine (3 - Booster for Pfizer series) 11/07/2019  . TETANUS/TDAP  02/08/2021 (Originally 09/12/2019)  . DEXA SCAN  07/16/2021 (Originally 12/10/2011)  . INFLUENZA VACCINE  10/22/2020  . FOOT EXAM  11/23/2020  . OPHTHALMOLOGY EXAM  01/22/2021  . HEMOGLOBIN A1C  01/26/2021  . MAMMOGRAM  04/03/2021  . COLONOSCOPY (Pts 45-55yrs Insurance coverage will need to be confirmed)  03/24/2026  . Hepatitis C Screening  Completed  . PNA vac Low Risk Adult  Completed  . HPV VACCINES  Aged Out    Health Maintenance  Health Maintenance Due  Topic Date Due  . COVID-19 Vaccine (3 - Booster for Pfizer series) 11/07/2019    Colorectal cancer screening: Type of screening: Colonoscopy. Completed 2018. Repeat every 10 years  Mammogram status: Completed 04/04/19. Repeat every year  Bone Density status: Ordered today. Pt provided with contact  info and advised to call to schedule appt.  Lung Cancer Screening: (Low Dose CT Chest recommended if Age 98-80 years, 30 pack-year currently smoking OR have quit w/in 15years.) does not qualify.   Additional Screening:  Hepatitis C Screening: does qualify; Completed 04/01/17  Vision Screening: Recommended annual ophthalmology exams for early detection of glaucoma and other disorders of the eye. Is the patient up to date with their annual eye exam?  Yes  Who is the provider or what is the name of the office in which the patient attends annual eye exams? Dr. Ellin Mayhew.   Dental Screening: Recommended annual dental exams for proper oral hygiene  Community Resource Referral / Chronic Care Management: CRR required this visit?  No   CCM required this visit?  No      Plan:     I have personally reviewed and noted the following in the patient's chart:   . Medical and social history . Use of alcohol, tobacco or illicit drugs  . Current medications and supplements including opioid prescriptions.  . Functional ability and status . Nutritional status . Physical activity . Advanced directives . List of other physicians . Hospitalizations, surgeries, and ER visits in previous 12 months . Vitals . Screenings to include cognitive, depression, and falls . Referrals and appointments  In addition, I have reviewed and discussed with patient certain preventive protocols, quality metrics, and best practice recommendations. A written personalized care plan for preventive services as well as general preventive health recommendations were provided to patient.     Clemetine Marker, LPN   [id number]   Nurse Notes: none

## 2020-08-08 NOTE — Patient Instructions (Signed)
[name] , Thank you for taking time to come for your Medicare Wellness Visit. I appreciate your ongoing commitment to your health goals. Please review the following plan we discussed and let me know if I can assist you in the future.   Screening recommendations/referrals: Colonoscopy: done 2018 Mammogram: done 04/04/19. Please call [phone] to schedule your mammogram and bone density screening.  Bone Density: due Recommended yearly ophthalmology/optometry visit for glaucoma screening and checkup Recommended yearly dental visit for hygiene and checkup  Vaccinations: Influenza vaccine: done 02/09/20 Pneumococcal vaccine: done 10/24/14 Tdap vaccine: due Shingles vaccine: Shingrix discussed. Please contact your pharmacy for coverage information.  Covid-19: done 05/17/19 & 06/07/19  Advanced directives: Advance directive discussed with you today. I have provided a copy for you to complete at home and have notarized. Once this is complete please bring a copy in to our office so we can scan it into your chart.  Conditions/risks identified: Recommend increasing physical activity   Next appointment: Follow up in one year for your annual wellness visit    Preventive Care 65 Years and Older, Female Preventive care refers to lifestyle choices and visits with your health care provider that can promote health and wellness. What does preventive care include?  A yearly physical exam. This is also called an annual well check.  Dental exams once or twice a year.  Routine eye exams. Ask your health care provider how often you should have your eyes checked.  Personal lifestyle choices, including:  Daily care of your teeth and gums.  Regular physical activity.  Eating a healthy diet.  Avoiding tobacco and drug use.  Limiting alcohol use.  Practicing safe sex.  Taking low-dose aspirin every day.  Taking vitamin and mineral supplements as recommended by your health care provider. What  happens during an annual well check? The services and screenings done by your health care provider during your annual well check will depend on your age, overall health, lifestyle risk factors, and family history of disease. Counseling  Your health care provider may ask you questions about your:  Alcohol use.  Tobacco use.  Drug use.  Emotional well-being.  Home and relationship well-being.  Sexual activity.  Eating habits.  History of falls.  Memory and ability to understand (cognition).  Work and work Statistician.  Reproductive health. Screening  You may have the following tests or measurements:  Height, weight, and BMI.  Blood pressure.  Lipid and cholesterol levels. These may be checked every 5 years, or more frequently if you are over 30 years old.  Skin check.  Lung cancer screening. You may have this screening every year starting at age 36 if you have a 30-pack-year history of smoking and currently smoke or have quit within the past 15 years.  Fecal occult blood test (FOBT) of the stool. You may have this test every year starting at age 45.  Flexible sigmoidoscopy or colonoscopy. You may have a sigmoidoscopy every 5 years or a colonoscopy every 10 years starting at age 10.  Hepatitis C blood test.  Hepatitis B blood test.  Sexually transmitted disease (STD) testing.  Diabetes screening. This is done by checking your blood sugar (glucose) after you have not eaten for a while (fasting). You may have this done every 1-3 years.  Bone density scan. This is done to screen for osteoporosis. You may have this done starting at age 33.  Mammogram. This may be done every 1-2 years. Talk to your health care provider about how  often you should have regular mammograms. Talk with your health care provider about your test results, treatment options, and if necessary, the need for more tests. Vaccines  Your health care provider may recommend certain vaccines, such  as:  Influenza vaccine. This is recommended every year.  Tetanus, diphtheria, and acellular pertussis (Tdap, Td) vaccine. You may need a Td booster every 10 years.  Zoster vaccine. You may need this after age 81.  Pneumococcal 13-valent conjugate (PCV13) vaccine. One dose is recommended after age 69.  Pneumococcal polysaccharide (PPSV23) vaccine. One dose is recommended after age 41. Talk to your health care provider about which screenings and vaccines you need and how often you need them. This information is not intended to replace advice given to you by your health care provider. Make sure you discuss any questions you have with your health care provider. Document Released: 04/06/2015 Document Revised: 11/28/2015 Document Reviewed: 01/09/2015 Elsevier Interactive Patient Education  2017 [place] Prevention in the Home Falls can cause injuries. They can happen to people of all ages. There are many things you can do to make your home safe and to help prevent falls. What can I do on the outside of my home?  Regularly fix the edges of walkways and driveways and fix any cracks.  Remove anything that might make you trip as you walk through a door, such as a raised step or threshold.  Trim any bushes or trees on the path to your home.  Use bright outdoor lighting.  Clear any walking paths of anything that might make someone trip, such as rocks or tools.  Regularly check to see if handrails are loose or broken. Make sure that both sides of any steps have handrails.  Any raised decks and porches should have guardrails on the edges.  Have any leaves, snow, or ice cleared regularly.  Use sand or salt on walking paths during winter.  Clean up any spills in your garage right away. This includes oil or grease spills. What can I do in the bathroom?  Use night lights.  Install grab bars by the toilet and in the tub and shower. Do not use towel bars as grab bars.  Use  non-skid mats or decals in the tub or shower.  If you need to sit down in the shower, use a plastic, non-slip stool.  Keep the floor dry. Clean up any water that spills on the floor as soon as it happens.  Remove soap buildup in the tub or shower regularly.  Attach bath mats securely with double-sided non-slip rug tape.  Do not have throw rugs and other things on the floor that can make you trip. What can I do in the bedroom?  Use night lights.  Make sure that you have a light by your bed that is easy to reach.  Do not use any sheets or blankets that are too big for your bed. They should not hang down onto the floor.  Have a firm chair that has side arms. You can use this for support while you get dressed.  Do not have throw rugs and other things on the floor that can make you trip. What can I do in the kitchen?  Clean up any spills right away.  Avoid walking on wet floors.  Keep items that you use a lot in easy-to-reach places.  If you need to reach something above you, use a strong step stool that has a grab bar.  Keep electrical  cords out of the way.  Do not use floor polish or wax that makes floors slippery. If you must use wax, use non-skid floor wax.  Do not have throw rugs and other things on the floor that can make you trip. What can I do with my stairs?  Do not leave any items on the stairs.  Make sure that there are handrails on both sides of the stairs and use them. Fix handrails that are broken or loose. Make sure that handrails are as long as the stairways.  Check any carpeting to make sure that it is firmly attached to the stairs. Fix any carpet that is loose or worn.  Avoid having throw rugs at the top or bottom of the stairs. If you do have throw rugs, attach them to the floor with carpet tape.  Make sure that you have a light switch at the top of the stairs and the bottom of the stairs. If you do not have them, ask someone to add them for you. What  else can I do to help prevent falls?  Wear shoes that:  Do not have high heels.  Have rubber bottoms.  Are comfortable and fit you well.  Are closed at the toe. Do not wear sandals.  If you use a stepladder:  Make sure that it is fully opened. Do not climb a closed stepladder.  Make sure that both sides of the stepladder are locked into place.  Ask someone to hold it for you, if possible.  Clearly mark and make sure that you can see:  Any grab bars or handrails.  First and last steps.  Where the edge of each step is.  Use tools that help you move around (mobility aids) if they are needed. These include:  Canes.  Walkers.  Scooters.  Crutches.  Turn on the lights when you go into a dark area. Replace any light bulbs as soon as they burn out.  Set up your furniture so you have a clear path. Avoid moving your furniture around.  If any of your floors are uneven, fix them.  If there are any pets around you, be aware of where they are.  Review your medicines with your doctor. Some medicines can make you feel dizzy. This can increase your chance of falling. Ask your doctor what other things that you can do to help prevent falls. This information is not intended to replace advice given to you by your health care provider. Make sure you discuss any questions you have with your health care provider. Document Released: 01/04/2009 Document Revised: 08/16/2015 Document Reviewed: 04/14/2014 Elsevier Interactive Patient Education  2017 [employer].

## 2020-08-09 ENCOUNTER — Other Ambulatory Visit: Payer: Self-pay | Admitting: Family Medicine

## 2020-08-09 DIAGNOSIS — I10 Essential (primary) hypertension: Secondary | ICD-10-CM

## 2020-08-09 NOTE — Telephone Encounter (Signed)
Requested Prescriptions  Pending Prescriptions Disp Refills  . lisinopril (ZESTRIL) 40 MG tablet [Pharmacy Med Name: Lisinopril 40 MG Oral Tablet] 90 tablet 0    Sig: TAKE 1 TABLET BY MOUTH  DAILY     Cardiovascular:  ACE Inhibitors Passed - 08/09/2020 11:17 PM      Passed - Cr in normal range and within 180 days    Creatinine, Ser  Date Value Ref Range Status  07/17/2020 0.90 0.57 - 1.00 mg/dL Final         Passed - K in normal range and within 180 days    Potassium  Date Value Ref Range Status  07/17/2020 4.4 3.5 - 5.2 mmol/L Final         Passed - Patient is not pregnant      Passed - Last BP in normal range    BP Readings from Last 1 Encounters:  07/26/20 130/80         Passed - Valid encounter within last 6 months    Recent Outpatient Visits          2 weeks ago Cognitive change   [facility] Juline Patch, MD   3 weeks ago Cognitive change   [facility] Juline Patch, MD   4 months ago Essential hypertension   [place], MD   6 months ago Essential hypertension   [place], Deanna C, MD   1 year ago Essential hypertension   [place], Deanna C, MD      Future Appointments            In 3 months Juline Patch, MD [facility]

## 2020-09-11 ENCOUNTER — Telehealth: Payer: Self-pay

## 2020-09-11 NOTE — Telephone Encounter (Unsigned)
Copied from [place] [phone]. Topic: General - Other >> Sep 11, 2020 10:20 AM Leward Quan A wrote: Reason for CRM: Patient daughter Virl Axe called in to speak to Baxter Flattery to inform her that patient have an appointment with her Neurologist on 09/19/20 same time as DEXA and Mammogram are scheduled. Please call Kim at Ph# [phone]

## 2020-09-11 NOTE — Telephone Encounter (Signed)
Called pt and left message that her mammogram and bone density are scheduled for June 29th @ 1:00 in [place]

## 2020-09-19 ENCOUNTER — Inpatient Hospital Stay: Admission: RE | Admit: 2020-09-19 | Payer: Medicare Other | Source: Ambulatory Visit

## 2020-09-20 ENCOUNTER — Ambulatory Visit
Admission: RE | Admit: 2020-09-20 | Discharge: 2020-09-20 | Disposition: A | Discharge status: Home / Self Care | Payer: Medicare Other | Source: Ambulatory Visit | Attending: Family Medicine | Admitting: Family Medicine

## 2020-09-20 ENCOUNTER — Other Ambulatory Visit: Payer: Self-pay

## 2020-09-20 DIAGNOSIS — Z1231 Encounter for screening mammogram for malignant neoplasm of breast: Secondary | ICD-10-CM | POA: Insufficient documentation

## 2020-09-20 DIAGNOSIS — Z780 Asymptomatic menopausal state: Secondary | ICD-10-CM | POA: Diagnosis not present

## 2020-09-20 DIAGNOSIS — M810 Age-related osteoporosis without current pathological fracture: Secondary | ICD-10-CM | POA: Diagnosis not present

## 2020-09-25 ENCOUNTER — Other Ambulatory Visit: Payer: Self-pay

## 2020-09-25 DIAGNOSIS — I10 Essential (primary) hypertension: Secondary | ICD-10-CM

## 2020-09-25 MED ORDER — METOPROLOL SUCCINATE ER 50 MG PO TB24: 50.0000 mg | ORAL_TABLET | Freq: Every day | ORAL | 1 refills | Status: DC

## 2020-09-26 ENCOUNTER — Other Ambulatory Visit: Payer: Self-pay

## 2020-09-26 MED ORDER — ALENDRONATE SODIUM 70 MG PO TABS: 70.0000 mg | ORAL_TABLET | ORAL | 5 refills | Status: DC

## 2020-10-01 DIAGNOSIS — H2513 Age-related nuclear cataract, bilateral: Secondary | ICD-10-CM | POA: Diagnosis not present

## 2020-10-01 DIAGNOSIS — E119 Type 2 diabetes mellitus without complications: Secondary | ICD-10-CM | POA: Diagnosis not present

## 2020-10-01 DIAGNOSIS — H401131 Primary open-angle glaucoma, bilateral, mild stage: Secondary | ICD-10-CM | POA: Diagnosis not present

## 2020-10-01 LAB — HM DIABETES EYE EXAM

## 2020-10-09 DIAGNOSIS — M810 Age-related osteoporosis without current pathological fracture: Secondary | ICD-10-CM | POA: Diagnosis not present

## 2020-10-09 DIAGNOSIS — Z79899 Other long term (current) drug therapy: Secondary | ICD-10-CM | POA: Diagnosis not present

## 2020-10-09 DIAGNOSIS — E1159 Type 2 diabetes mellitus with other circulatory complications: Secondary | ICD-10-CM | POA: Diagnosis not present

## 2020-10-09 DIAGNOSIS — E1165 Type 2 diabetes mellitus with hyperglycemia: Secondary | ICD-10-CM | POA: Diagnosis not present

## 2020-10-09 DIAGNOSIS — I152 Hypertension secondary to endocrine disorders: Secondary | ICD-10-CM | POA: Diagnosis not present

## 2020-10-09 DIAGNOSIS — E782 Mixed hyperlipidemia: Secondary | ICD-10-CM | POA: Diagnosis not present

## 2020-10-09 DIAGNOSIS — E1169 Type 2 diabetes mellitus with other specified complication: Secondary | ICD-10-CM | POA: Diagnosis not present

## 2020-10-09 DIAGNOSIS — E042 Nontoxic multinodular goiter: Secondary | ICD-10-CM | POA: Diagnosis not present

## 2020-10-15 ENCOUNTER — Other Ambulatory Visit (HOSPITAL_COMMUNITY): Payer: Self-pay | Admitting: Neurology

## 2020-10-15 ENCOUNTER — Other Ambulatory Visit: Payer: Self-pay | Admitting: Neurology

## 2020-10-15 DIAGNOSIS — F0390 Unspecified dementia without behavioral disturbance: Secondary | ICD-10-CM

## 2020-10-24 ENCOUNTER — Encounter (HOSPITAL_COMMUNITY): Payer: Self-pay

## 2020-10-24 ENCOUNTER — Ambulatory Visit (HOSPITAL_COMMUNITY): Admission: RE | Admit: 2020-10-24 | Payer: Medicare Other | Source: Ambulatory Visit

## 2020-11-02 ENCOUNTER — Other Ambulatory Visit: Payer: Self-pay | Admitting: Family Medicine

## 2020-11-02 DIAGNOSIS — I10 Essential (primary) hypertension: Secondary | ICD-10-CM

## 2020-11-28 ENCOUNTER — Ambulatory Visit (INDEPENDENT_AMBULATORY_CARE_PROVIDER_SITE_OTHER): Payer: Medicare Other | Admitting: Family Medicine

## 2020-11-28 ENCOUNTER — Encounter: Payer: Self-pay | Admitting: Family Medicine

## 2020-11-28 ENCOUNTER — Other Ambulatory Visit: Payer: Self-pay

## 2020-11-28 VITALS — BP 120/70 | HR 68 | Ht 62.0 in | Wt 119.0 lb

## 2020-11-28 DIAGNOSIS — I10 Essential (primary) hypertension: Secondary | ICD-10-CM

## 2020-11-28 DIAGNOSIS — E7849 Other hyperlipidemia: Secondary | ICD-10-CM

## 2020-11-28 DIAGNOSIS — Z23 Encounter for immunization: Secondary | ICD-10-CM

## 2020-11-28 MED ORDER — EZETIMIBE 10 MG PO TABS: 10.0000 mg | ORAL_TABLET | Freq: Every day | ORAL | 1 refills | Status: DC

## 2020-11-28 MED ORDER — LISINOPRIL 40 MG PO TABS: 40.0000 mg | ORAL_TABLET | Freq: Every day | ORAL | 1 refills | Status: DC

## 2020-11-28 MED ORDER — METOPROLOL SUCCINATE ER 50 MG PO TB24: 50.0000 mg | ORAL_TABLET | Freq: Every day | ORAL | 1 refills | Status: DC

## 2020-11-28 NOTE — Progress Notes (Signed)
Date:  11/28/2020   Name:  Ann Chaney   DOB:  [birth year]   MRN:  [id number]   Chief Complaint: Hyperlipidemia, Hypertension, and Diabetes  Hyperlipidemia This is a chronic problem. The current episode started more than 1 year ago. Exacerbating diseases include diabetes. She has no history of hypothyroidism or obesity. Associated symptoms include leg pain. Pertinent negatives include no chest pain, focal weakness, myalgias or shortness of breath. There are no compliance problems.   Hypertension This is a chronic problem. The current episode started more than 1 year ago. Pertinent negatives include no anxiety, blurred vision, chest pain, headaches, malaise/fatigue, neck pain, orthopnea, palpitations, peripheral edema, PND, shortness of breath or sweats. Risk factors for coronary artery disease include diabetes mellitus and dyslipidemia. There are no compliance problems.   Diabetes She presents for her follow-up diabetic visit. She has type 2 diabetes mellitus. No MedicAlert identification noted. The initial diagnosis of diabetes was made 14 years ago. Her disease course has been stable. Pertinent negatives for hypoglycemia include no confusion, dizziness, headaches, hunger, mood changes, nervousness/anxiousness, pallor, seizures, sleepiness, speech difficulty, sweats or tremors. Pertinent negatives for diabetes include no blurred vision, no chest pain, no fatigue, no foot paresthesias, no foot ulcerations, no polydipsia, no polyphagia, no polyuria, no visual change, no weakness and no weight loss. Pertinent negatives for hypoglycemia complications include no blackouts, no hospitalization, no nocturnal hypoglycemia, no required assistance and no required glucagon injection. Symptoms are stable. She has not had a previous visit with a dietitian. Her breakfast blood glucose range is generally 90-110 mg/dl. She does not see a podiatrist.Eye exam is current.   Lab Results  Component Value Date    CREATININE 0.90 07/17/2020   BUN 16 07/17/2020   NA 142 07/17/2020   K 4.4 07/17/2020   CL 105 07/17/2020   CO2 22 07/17/2020   Lab Results  Component Value Date   CHOL 228 (H) 07/17/2020   HDL 45 07/17/2020   LDLCALC 162 (H) 07/17/2020   TRIG 117 07/17/2020   CHOLHDL 6.0 (H) 10/01/2017   Lab Results  Component Value Date   TSH 0.763 07/01/2017   Lab Results  Component Value Date   HGBA1C 6.4 (H) 07/26/2020   Lab Results  Component Value Date   WBC 7.4 07/01/2017   HGB 13.1 07/01/2017   HCT 40.8 07/01/2017   MCV 74 (L) 07/01/2017   PLT 374 07/01/2017   Lab Results  Component Value Date   ALT 15 07/01/2017   AST 15 07/01/2017   ALKPHOS 98 07/01/2017   BILITOT 0.2 07/01/2017     Review of Systems  Constitutional:  Negative for fatigue, malaise/fatigue and weight loss.  Eyes:  Negative for blurred vision.  Respiratory:  Negative for shortness of breath.   Cardiovascular:  Negative for chest pain, palpitations, orthopnea and PND.  Endocrine: Negative for polydipsia, polyphagia and polyuria.  Musculoskeletal:  Negative for myalgias and neck pain.  Skin:  Negative for pallor.  Neurological:  Negative for dizziness, tremors, focal weakness, seizures, speech difficulty, weakness and headaches.  Psychiatric/Behavioral:  Negative for confusion. The patient is not nervous/anxious.    Patient Active Problem List   Diagnosis Date Noted   Statin myopathy 03/09/2020   Statin intolerance 09/23/2019   Multinodular goiter 04/16/2018   Mixed diabetic hyperlipidemia associated with type 2 diabetes mellitus ([place]) 11/10/2017   Tobacco dependence 11/10/2017   Microcytosis 11/17/2016   Depression, major, single episode, complete remission ([place]) 11/17/2016   Insomnia 07/22/2016  Hyperlipidemia 07/22/2016   Type 2 diabetes mellitus with hyperglycemia, without long-term current use of insulin ([place]) 06/24/2016   Dental abscess 08/27/2015   Smoker 11/04/2013   Goiter 01/26/2012    Hypertension associated with diabetes ([place]) 12/17/2009    Allergies  Allergen Reactions   Atorvastatin Other (See Comments)    Myalgias   Pravastatin Sodium Nausea Only    Past Surgical History:  Procedure Laterality Date   ABDOMINAL HYSTERECTOMY     HYSTEROTOMY      Social History   Tobacco Use   Smoking status: Former    Packs/day: 0.50    Types: Cigarettes    Quit date: 07/31/2020    Years since quitting: 0.3   Smokeless tobacco: Never  Vaping Use   Vaping Use: Never used  Substance Use Topics   Alcohol use: No   Drug use: No     Medication list has been reviewed and updated.  Current Meds  Medication Sig   ACCU-CHEK AVIVA PLUS test strip 1 each daily.   Accu-Chek Softclix Lancets lancets    alendronate (FOSAMAX) 70 MG tablet Take 1 tablet (70 mg total) by mouth every 7 (seven) days. Take with a full glass of water on an empty stomach.   ezetimibe (ZETIA) 10 MG tablet Take 1 tablet (10 mg total) by mouth daily.   glipiZIDE (GLUCOTROL) 5 MG tablet Take 1 tablet (5 mg total) by mouth 2 (two) times daily before a meal.   hydrochlorothiazide (HYDRODIURIL) 25 MG tablet Take 0.5 tablets by mouth 2 (two) times daily.   latanoprost (XALATAN) 0.005 % ophthalmic solution 1 drop at bedtime.   lisinopril (ZESTRIL) 40 MG tablet TAKE 1 TABLET BY MOUTH  DAILY   metFORMIN (GLUCOPHAGE) 1000 MG tablet Take 1 tablet (1,000 mg total) by mouth 2 (two) times daily.   methimazole (TAPAZOLE) 5 MG tablet Take 0.5 tablets by mouth daily. Hillary Blackwood   metoprolol succinate (TOPROL-XL) 50 MG 24 hr tablet Take 1 tablet (50 mg total) by mouth daily. Take with or immediately following a meal.   Multiple Vitamin (MULTIVITAMIN) capsule Take 1 capsule by mouth daily.   pioglitazone (ACTOS) 15 MG tablet Take 1 tablet (15 mg total) by mouth daily.   timolol (TIMOPTIC) 0.25 % ophthalmic solution 1 drop every morning.    PHQ 2/9 Scores 08/08/2020 07/26/2020 04/05/2020 02/09/2020  PHQ - 2 Score  0 1 0 0  PHQ- 9 Score - 6 0 0    GAD 7 : Generalized Anxiety Score 07/26/2020 02/09/2020 08/09/2019 07/19/2019  Nervous, Anxious, on Edge 0 0 0 0  Control/stop worrying 0 0 0 0  Worry too much - different things 0 0 0 0  Trouble relaxing 1 0 0 0  Restless 2 0 0 0  Easily annoyed or irritable 2 0 0 0  Afraid - awful might happen 2 0 0 0  Total GAD 7 Score 7 0 0 0  Anxiety Difficulty Not difficult at all - - -    BP Readings from Last 3 Encounters:  07/26/20 130/80  07/16/20 130/80  04/05/20 130/80    Physical Exam Vitals and nursing note reviewed.  Constitutional:      Appearance: She is well-developed.  HENT:     Head: Normocephalic.     Right Ear: External ear normal.     Left Ear: External ear normal.  Eyes:     General: Lids are everted, no foreign bodies appreciated. No scleral icterus.  Left eye: No foreign body or hordeolum.     Conjunctiva/sclera: Conjunctivae normal.     Right eye: Right conjunctiva is not injected.     Left eye: Left conjunctiva is not injected.     Pupils: Pupils are equal, round, and reactive to light.  Neck:     Thyroid: No thyromegaly.     Vascular: No JVD.     Trachea: No tracheal deviation.  Cardiovascular:     Rate and Rhythm: Normal rate and regular rhythm.     Heart sounds: Normal heart sounds. No murmur heard.   No friction rub. No gallop.  Pulmonary:     Effort: Pulmonary effort is normal. No respiratory distress.     Breath sounds: Normal breath sounds. No wheezing or rales.  Abdominal:     General: Bowel sounds are normal.     Palpations: Abdomen is soft. There is no mass.     Tenderness: There is no abdominal tenderness. There is no guarding or rebound.  Musculoskeletal:        General: No tenderness. Normal range of motion.     Cervical back: Normal range of motion and neck supple.  Lymphadenopathy:     Cervical: No cervical adenopathy.  Skin:    General: Skin is warm.     Findings: No rash.  Neurological:      Mental Status: She is alert and oriented to person, place, and time.     Cranial Nerves: No cranial nerve deficit.  Psychiatric:        Mood and Affect: Mood is not anxious or depressed.    Wt Readings from Last 3 Encounters:  11/28/20 119 lb (54 kg)  07/26/20 128 lb (58.1 kg)  07/16/20 127 lb (57.6 kg)    Ht '5\' 2"'$  (1.575 m)   Wt 119 lb (54 kg)   BMI 21.77 kg/m   Assessment and Plan: 1. Essential hypertension .  Controlled.  Stable.  Continue metoprolol XL 50 mg once a day and lisinopril 40 mg once a day.  Blood pressure was noted today to be 120/70.  We will check CMP for electrolytes and GFR. - metoprolol succinate (TOPROL-XL) 50 MG 24 hr tablet; Take 1 tablet (50 mg total) by mouth daily. Take with or immediately following a meal.  Dispense: 180 tablet; Refill: 1 - lisinopril (ZESTRIL) 40 MG tablet; Take 1 tablet (40 mg total) by mouth daily.  Dispense: 90 tablet; Refill: 1 - Comprehensive Metabolic Panel (CMET)  2. Hyperlipidemia, familial, high LDL Chronic.  Controlled.  Stable.  Continue Zetia 10 mg once a day.  Will check lipid panel for current status of LDL. - ezetimibe (ZETIA) 10 MG tablet; Take 1 tablet (10 mg total) by mouth daily.  Dispense: 90 tablet; Refill: 1 - Lipid Panel With LDL/HDL Ratio  3. Need for immunization against influenza Discussed and administered. - Flu Vaccine QUAD High Dose(Fluad)   It was noted that patient missed her MRI and apparently the family nor her were cognitive of this.  We would like to reschedule this and patient's family is to call neurology to see if this can be done and understands that information in the future needs to be conveyed to the family/daughter as to upcoming appointments and procedures.  It was also pointed out to the patient that she has an upcoming endocrine and neurology appointment as well.

## 2020-11-29 LAB — COMPREHENSIVE METABOLIC PANEL
ALT: 11 IU/L (ref 0–32)
AST: 19 IU/L (ref 0–40)
Albumin/Globulin Ratio: 2 (ref 1.2–2.2)
Albumin: 4.7 g/dL (ref 3.7–4.7)
Alkaline Phosphatase: 75 IU/L (ref 44–121)
BUN/Creatinine Ratio: 12 (ref 12–28)
BUN: 10 mg/dL (ref 8–27)
Bilirubin Total: 0.4 mg/dL (ref 0.0–1.2)
CO2: 23 mmol/L (ref 20–29)
Calcium: 9.9 mg/dL (ref 8.7–10.3)
Chloride: 104 mmol/L (ref 96–106)
Creatinine, Ser: 0.84 mg/dL (ref 0.57–1.00)
Globulin, Total: 2.3 g/dL (ref 1.5–4.5)
Glucose: 109 mg/dL — ABNORMAL HIGH (ref 65–99)
Potassium: 4.4 mmol/L (ref 3.5–5.2)
Sodium: 142 mmol/L (ref 134–144)
Total Protein: 7 g/dL (ref 6.0–8.5)
eGFR: 73 mL/min/{1.73_m2} (ref >59)

## 2020-11-29 LAB — LIPID PANEL WITH LDL/HDL RATIO
Cholesterol, Total: 220 mg/dL — ABNORMAL HIGH (ref 100–199)
HDL: 51 mg/dL (ref >39)
LDL Chol Calc (NIH): 147 mg/dL — ABNORMAL HIGH (ref 0–99)
LDL/HDL Ratio: 2.9 ratio (ref 0.0–3.2)
Triglycerides: 122 mg/dL (ref 0–149)
VLDL Cholesterol Cal: 22 mg/dL (ref 5–40)

## 2020-12-06 DIAGNOSIS — E119 Type 2 diabetes mellitus without complications: Secondary | ICD-10-CM | POA: Diagnosis not present

## 2020-12-06 DIAGNOSIS — E0500 Thyrotoxicosis with diffuse goiter without thyrotoxic crisis or storm: Secondary | ICD-10-CM | POA: Diagnosis not present

## 2020-12-19 LAB — HM HEPATITIS C SCREENING LAB: HM Hepatitis Screen: NEGATIVE

## 2020-12-21 ENCOUNTER — Other Ambulatory Visit (HOSPITAL_COMMUNITY): Payer: Self-pay | Admitting: Neurology

## 2020-12-21 ENCOUNTER — Other Ambulatory Visit: Payer: Self-pay

## 2020-12-21 ENCOUNTER — Emergency Department (HOSPITAL_COMMUNITY)
Admission: EM | Admit: 2020-12-21 | Discharge: 2020-12-21 | Disposition: A | Discharge status: Home / Self Care | Payer: Medicare Other | Attending: Emergency Medicine | Admitting: Emergency Medicine

## 2020-12-21 ENCOUNTER — Emergency Department (HOSPITAL_COMMUNITY)
Admission: RE | Admit: 2020-12-21 | Discharge: 2020-12-21 | Disposition: A | Discharge status: Home / Self Care | Payer: Medicare Other | Source: Ambulatory Visit | Attending: Neurology | Admitting: Neurology

## 2020-12-21 DIAGNOSIS — Z7984 Long term (current) use of oral hypoglycemic drugs: Secondary | ICD-10-CM | POA: Diagnosis not present

## 2020-12-21 DIAGNOSIS — E119 Type 2 diabetes mellitus without complications: Secondary | ICD-10-CM | POA: Diagnosis not present

## 2020-12-21 DIAGNOSIS — D329 Benign neoplasm of meninges, unspecified: Secondary | ICD-10-CM | POA: Insufficient documentation

## 2020-12-21 DIAGNOSIS — I10 Essential (primary) hypertension: Secondary | ICD-10-CM | POA: Diagnosis not present

## 2020-12-21 DIAGNOSIS — G319 Degenerative disease of nervous system, unspecified: Secondary | ICD-10-CM | POA: Diagnosis not present

## 2020-12-21 DIAGNOSIS — Z87891 Personal history of nicotine dependence: Secondary | ICD-10-CM | POA: Insufficient documentation

## 2020-12-21 DIAGNOSIS — Z79899 Other long term (current) drug therapy: Secondary | ICD-10-CM | POA: Diagnosis not present

## 2020-12-21 DIAGNOSIS — F0390 Unspecified dementia without behavioral disturbance: Secondary | ICD-10-CM | POA: Insufficient documentation

## 2020-12-21 DIAGNOSIS — R4182 Altered mental status, unspecified: Secondary | ICD-10-CM | POA: Diagnosis present

## 2020-12-21 DIAGNOSIS — G936 Cerebral edema: Secondary | ICD-10-CM | POA: Diagnosis not present

## 2020-12-21 LAB — CBC WITH DIFFERENTIAL/PLATELET
Abs Immature Granulocytes: 0.01 10*3/uL (ref 0.00–0.07)
Basophils Absolute: 0 10*3/uL (ref 0.0–0.1)
Basophils Relative: 0 %
Eosinophils Absolute: 0.1 10*3/uL (ref 0.0–0.5)
Eosinophils Relative: 2 %
HCT: 40.8 % (ref 36.0–46.0)
Hemoglobin: 12.7 g/dL (ref 12.0–15.0)
Immature Granulocytes: 0 %
Lymphocytes Relative: 57 %
Lymphs Abs: 3 10*3/uL (ref 0.7–4.0)
MCH: 23.9 pg — ABNORMAL LOW (ref 26.0–34.0)
MCHC: 31.1 g/dL (ref 30.0–36.0)
MCV: 76.7 fL — ABNORMAL LOW (ref 80.0–100.0)
Monocytes Absolute: 0.3 10*3/uL (ref 0.1–1.0)
Monocytes Relative: 7 %
Neutro Abs: 1.8 10*3/uL (ref 1.7–7.7)
Neutrophils Relative %: 34 %
Platelets: 332 10*3/uL (ref 150–400)
RBC: 5.32 MIL/uL — ABNORMAL HIGH (ref 3.87–5.11)
RDW: 16 % — ABNORMAL HIGH (ref 11.5–15.5)
WBC: 5.3 10*3/uL (ref 4.0–10.5)
nRBC: 0 % (ref 0.0–0.2)

## 2020-12-21 LAB — COMPREHENSIVE METABOLIC PANEL
ALT: 11 U/L (ref 0–44)
AST: 17 U/L (ref 15–41)
Albumin: 4 g/dL (ref 3.5–5.0)
Alkaline Phosphatase: 61 U/L (ref 38–126)
Anion gap: 9 (ref 5–15)
BUN: 8 mg/dL (ref 8–23)
CO2: 25 mmol/L (ref 22–32)
Calcium: 9.9 mg/dL (ref 8.9–10.3)
Chloride: 105 mmol/L (ref 98–111)
Creatinine, Ser: 0.87 mg/dL (ref 0.44–1.00)
GFR, Estimated: >60 mL/min (ref >60)
Glucose, Bld: 106 mg/dL — ABNORMAL HIGH (ref 70–99)
Potassium: 3.9 mmol/L (ref 3.5–5.1)
Sodium: 139 mmol/L (ref 135–145)
Total Bilirubin: 0.9 mg/dL (ref 0.3–1.2)
Total Protein: 6.9 g/dL (ref 6.5–8.1)

## 2020-12-21 MED ORDER — DEXAMETHASONE 2 MG PO TABS: 2.0000 mg | ORAL_TABLET | Freq: Two times a day (BID) | ORAL | 0 refills | Status: DC

## 2020-12-21 MED ORDER — GADOBUTROL 1 MMOL/ML IV SOLN
5.0000 mL | Freq: Once | INTRAVENOUS | Status: AC | PRN
  Administered 2020-12-21: 5 mL via INTRAVENOUS

## 2020-12-21 NOTE — ED Provider Notes (Signed)
[facility] EMERGENCY DEPARTMENT Provider Note   CSN: [id number] Arrival date & time: 12/21/20  1235     History Chief Complaint  Patient presents with   Altered Mental Status    Ann Chaney is a 75 y.o. female.  Patient sent over by neurology after having outpatient MRI today that showed large meningioma causing mass-effect.  For the last several months patient has been having episodes of confusion, headaches, gait issues, memory changes, behavior changes.  The history is provided by the patient.  Altered Mental Status Presenting symptoms: confusion   Severity:  Mild Most recent episode: 4-5 months, maybe more. Timing:  Intermittent Progression:  Waxing and waning Chronicity:  New Associated symptoms: headaches   Associated symptoms: no abdominal pain, no fever, no palpitations, no rash, no seizures and no vomiting       Past Medical History:  Diagnosis Date   Diabetes mellitus without complication ([place])    Glaucoma    Glaucoma    Hyperlipidemia    Hypertension     Patient Active Problem List   Diagnosis Date Noted   Statin myopathy 03/09/2020   Statin intolerance 09/23/2019   Multinodular goiter 04/16/2018   Mixed diabetic hyperlipidemia associated with type 2 diabetes mellitus ([place]) 11/10/2017   Tobacco dependence 11/10/2017   Microcytosis 11/17/2016   Depression, major, single episode, complete remission ([place]) 11/17/2016   Insomnia 07/22/2016   Hyperlipidemia 07/22/2016   Type 2 diabetes mellitus with hyperglycemia, without long-term current use of insulin ([place]) 06/24/2016   Dental abscess 08/27/2015   Smoker 11/04/2013   Goiter 01/26/2012   Hypertension associated with diabetes ([place]) 12/17/2009    Past Surgical History:  Procedure Laterality Date   ABDOMINAL HYSTERECTOMY     HYSTEROTOMY       OB History   No obstetric history on file.     Family History  Problem Relation Age of Onset   Cancer Mother        sternum    Emphysema Mother    Diabetes Mother    Glaucoma Father    Dementia Father    Breast cancer Maternal Aunt     Social History   Tobacco Use   Smoking status: Former    Packs/day: 0.50    Types: Cigarettes    Quit date: 07/31/2020    Years since quitting: 0.3   Smokeless tobacco: Never  Vaping Use   Vaping Use: Never used  Substance Use Topics   Alcohol use: No   Drug use: No    Home Medications Prior to Admission medications   Medication Sig Start Date End Date Taking? Authorizing Provider  dexamethasone (DECADRON) 2 MG tablet Take 1 tablet (2 mg total) by mouth 2 (two) times daily. 12/21/20  Yes Earnie Larsson, MD  ACCU-CHEK AVIVA PLUS test strip 1 each daily. 06/24/19   [provider]  Accu-Chek Softclix Lancets lancets  06/24/19   [provider]  alendronate (FOSAMAX) 70 MG tablet Take 1 tablet (70 mg total) by mouth every 7 (seven) days. Take with a full glass of water on an empty stomach. 09/26/20   Glean Hess, MD  ezetimibe (ZETIA) 10 MG tablet Take 1 tablet (10 mg total) by mouth daily. 11/28/20   Juline Patch, MD  glipiZIDE (GLUCOTROL) 5 MG tablet Take 1 tablet (5 mg total) by mouth 2 (two) times daily before a meal. 04/05/18   Juline Patch, MD  hydrochlorothiazide (HYDRODIURIL) 25 MG tablet Take 0.5 tablets by  mouth 2 (two) times daily. 02/01/15   [provider]  latanoprost (XALATAN) 0.005 % ophthalmic solution 1 drop at bedtime.    [provider]  lisinopril (ZESTRIL) 40 MG tablet Take 1 tablet (40 mg total) by mouth daily. 11/28/20   Juline Patch, MD  metFORMIN (GLUCOPHAGE) 1000 MG tablet Take 1 tablet (1,000 mg total) by mouth 2 (two) times daily. 04/05/18   Juline Patch, MD  methimazole (TAPAZOLE) 5 MG tablet Take 0.5 tablets by mouth daily. Warnell Forester 04/22/19   [provider]  metoprolol succinate (TOPROL-XL) 50 MG 24 hr tablet Take 1 tablet (50 mg total) by mouth daily. Take with or immediately following a  meal. 11/28/20   Juline Patch, MD  Multiple Vitamin (MULTIVITAMIN) capsule Take 1 capsule by mouth daily.    [provider]  pioglitazone (ACTOS) 15 MG tablet Take 1 tablet (15 mg total) by mouth daily. 04/05/18   Juline Patch, MD  timolol (TIMOPTIC) 0.25 % ophthalmic solution 1 drop every morning. 06/19/20   [provider]    Allergies    Atorvastatin and Pravastatin sodium  Review of Systems   Review of Systems  Constitutional:  Negative for chills and fever.  HENT:  Negative for ear pain and sore throat.   Eyes:  Negative for pain and visual disturbance.  Respiratory:  Negative for cough and shortness of breath.   Cardiovascular:  Negative for chest pain and palpitations.  Gastrointestinal:  Negative for abdominal pain and vomiting.  Genitourinary:  Negative for dysuria and hematuria.  Musculoskeletal:  Negative for arthralgias and back pain.  Skin:  Negative for color change and rash.  Neurological:  Positive for headaches. Negative for seizures and syncope.  Psychiatric/Behavioral:  Positive for confusion.   All other systems reviewed and are negative.  Physical Exam Updated Vital Signs  ED Triage Vitals [12/21/20 1242]  Enc Vitals Group     BP (!) 194/54     Pulse Rate (!) 58     Resp 16     Temp 98.3 F (36.8 C)     Temp Source Oral     SpO2 100 %     Weight      Height      Head Circumference      Peak Flow      Pain Score      Pain Loc      Pain Edu?      Excl. in [place]?     Physical Exam Vitals and nursing note reviewed.  Constitutional:      General: She is not in acute distress.    Appearance: She is well-developed.  HENT:     Head: Normocephalic and atraumatic.  Eyes:     Conjunctiva/sclera: Conjunctivae normal.  Cardiovascular:     Rate and Rhythm: Normal rate and regular rhythm.     Pulses: Normal pulses.     Heart sounds: Normal heart sounds. No murmur heard. Pulmonary:     Effort: Pulmonary effort is normal. No  respiratory distress.     Breath sounds: Normal breath sounds.  Abdominal:     Palpations: Abdomen is soft.     Tenderness: There is no abdominal tenderness.  Musculoskeletal:     Cervical back: Neck supple.  Skin:    General: Skin is warm and dry.     Capillary Refill: Capillary refill takes less than 2 seconds.  Neurological:     General: No focal deficit present.  Mental Status: She is alert and oriented to person, place, and time.     Cranial Nerves: No cranial nerve deficit.     Sensory: No sensory deficit.     Coordination: Coordination normal.     Comments: Strength overall appears to be equal throughout may be some trace weakness in the right lower leg compared to the left, normal sensation, no drift, normal finger-nose-finger, normal visual fields, overall fluent speech    ED Results / Procedures / Treatments   Labs (all labs ordered are listed, but only abnormal results are displayed) Labs Reviewed  CBC WITH DIFFERENTIAL/PLATELET - Abnormal; Notable for the following components:      Result Value   RBC 5.32 (*)    MCV 76.7 (*)    MCH 23.9 (*)    RDW 16.0 (*)    All other components within normal limits  COMPREHENSIVE METABOLIC PANEL - Abnormal; Notable for the following components:   Glucose, Bld 106 (*)    All other components within normal limits    EKG None  Radiology MR BRAIN W WO CONTRAST  Result Date: 12/21/2020 CLINICAL DATA:  Dementia without behavioral disturbance, unspecified dementia type. EXAM: MRI HEAD WITHOUT AND WITH CONTRAST TECHNIQUE: Multiplanar, multiecho pulse sequences of the brain and surrounding structures were obtained without and with intravenous contrast. Additionally, using NeuroQuant software a 3D volumetric analysis of the brain was performed and is compared to a normative database adjusted for age, gender and intracranial volume. CONTRAST:  42mL GADAVIST GADOBUTROL 1 MMOL/ML IV SOLN COMPARISON:  No pertinent prior exams available for  comparison. FINDINGS: Brain: Subjectively, there is mild generalized cerebral and cerebellar atrophy. Large enhancing extra-axial dural-based mass along the left aspect of the mid-to-anterior falx, measuring 6.2 x 3.3 x 4.8 cm (for instance as seen on series 12, image 20) (series 13, image 21). There are irregular foci of SWI signal loss within the mass, likely reflecting a combination of mineralization and flow voids. The imaging features are most compatible with meningioma. Significant mass effect upon the underlying left frontal lobe with partial effacement of the left greater than right lateral ventricles, and 6 mm rightward midline shift measured at the level of the septum pellucidum. Severe surrounding vasogenic edema within the left cerebral hemisphere. A flow void is maintained within the superior sagittal sinus, and there is no evidence of superior sagittal sinus invasion at this time. Background moderate multifocal T2 FLAIR hyperintense signal abnormality within the cerebral white matter, nonspecific but compatible with chronic small-vessel ischemic disease. Chronic small-vessel infarct within the right basal ganglia/anterior limb of right internal capsule. There is no acute infarct. No extra-axial fluid collection. Vascular: Maintained flow voids within the proximal large arterial vessels. Skull and upper cervical spine: No focal suspicious marrow lesion. Trace C3-C4 grade 1 anterolisthesis. Sinuses/Orbits: Visualized orbits show no acute finding. Minimal bilateral ethmoid sinus mucosal thickening. Other: Trace bilateral mastoid effusions. NeuroQuant Findings: Volumetric analysis of the brain was performed, with a fully detailed report in [facility]. Briefly, the comparison with age and gender matched reference reveals a whole brain volume normative percentile of 99. However, the presence of a large left frontal mass limits reliability of this data. Impression #1 called by telephone at the time of  interpretation on 12/21/2020 at approximately 12:40 am to provider [facility] , who verbally acknowledged these results. Dr. Manuella Ghazi contact at the imaging center directly to provided the patient further direction. IMPRESSION: 6.2 x 3.3 x 4.8 cm dural-based mass along the left aspect  of the mid-to-anterior falx, as described and likely reflecting a large meningioma. Significant mass effect upon the underlying left frontal lobe with partial effacement of the left greater than right lateral ventricles, and 6 mm rightward midline shift measured at the level of the septum pellucidum. Severe surrounding vasogenic edema within the left cerebral hemisphere. No evidence of superior sagittal sinus invasion at this time. Background moderate chronic small vessel ischemic changes within the cerebral white matter. Chronic small-vessel infarct within the anterior limb of right internal capsule/right basal ganglia. NeuroQuant volumetric analysis of the brain, see details on [employer]. Briefly, the comparison with age and gender matched reference reveals a whole brain volume normative percentile of 99. However, the presence of a large left frontal mass limits reliability of this data. Subjectively, there is mild for age generalized cerebral and cerebellar atrophy. Electronically Signed   By: Kellie Simmering D.O.   On: 12/21/2020 12:56    Procedures Procedures   Medications Ordered in ED Medications - No data to display  ED Course  I have reviewed the triage vital signs and the nursing notes.  Pertinent labs & imaging results that were available during my care of the patient were reviewed by me and considered in my medical decision making (see chart for details).    MDM Rules/Calculators/A&P                           KAITLYNE FRIEDHOFF is here with abnormal MRI of brain.  History of diabetes, high cholesterol.  She has been having some confusion and behavior changes over the last several months.  She had an outpatient MRI  that was ordered by neurologist on today that showed a large meningioma with mass-effect into the left frontal lobe and some effacement of the ventricles.  Neurologically she is overall intact.  She has been mostly having some issues with making executive decisions and confusion.  Dr. Trenton Gammon neurosurgery was consulted and came to the ED to evaluate the patient.  We will start her on Decadron and she will follow-up next week for discussion about surgical intervention.  She was discharged in good condition.  This chart was dictated using voice recognition software.  Despite best efforts to proofread,  errors can occur which can change the documentation meaning.   Final Clinical Impression(s) / ED Diagnoses Final diagnoses:  Meningioma [facility])    Rx / DC Orders ED Discharge Orders          Ordered    dexamethasone (DECADRON) 2 MG tablet  2 times daily        12/21/20 [place], [place], DO 12/21/20 1742

## 2020-12-21 NOTE — ED Triage Notes (Signed)
Pt here POV d/t neuorogist, Dr Brigitte Pulse sent her to ED following MRI.  Pt alert and oriented X4  during traige. Daughter states pt is having issues with short term memory.

## 2020-12-21 NOTE — Consult Note (Signed)
Reason for Consult: Meningioma Referring Physician: Emergency department  Ann Chaney is an 75 y.o. female.  HPI: 75 year old female with progressive cognitive decline with poor memory function and personality change.  Patient's daughter also notes gait changes and some instability.  She wonders if she is having some mild weakness on the right side.  She has occasional headaches but these are not prominent.  She currently has no headaches.  No history of seizure.  No history of malignancy.  Patient underwent outpatient MRI scan today for evaluation of possible dementia.  MRI scan demonstrates a large parasagittal meningioma compressing her left frontal lobe.  Patient was sent to the emergency department for further evaluation.  Past Medical History:  Diagnosis Date   Diabetes mellitus without complication ([place])    Glaucoma    Glaucoma    Hyperlipidemia    Hypertension     Past Surgical History:  Procedure Laterality Date   ABDOMINAL HYSTERECTOMY     HYSTEROTOMY      Family History  Problem Relation Age of Onset   Cancer Mother        sternum   Emphysema Mother    Diabetes Mother    Glaucoma Father    Dementia Father    Breast cancer Maternal Aunt     Social History:  reports that she quit smoking about 4 months ago. Her smoking use included cigarettes. She smoked an average of .5 packs per day. She has never used smokeless tobacco. She reports that she does not drink alcohol and does not use drugs.  Allergies:  Allergies  Allergen Reactions   Atorvastatin Other (See Comments)    Myalgias   Pravastatin Sodium Nausea Only    Medications: I have reviewed the patient's current medications.  Results for orders placed or performed during the hospital encounter of 12/21/20 (from the past 48 hour(s))  CBC with Differential     Status: Abnormal   Collection Time: 12/21/20 12:54 PM  Result Value Ref Range   WBC 5.3 4.0 - 10.5 K/uL   RBC 5.32 (H) 3.87 - 5.11 MIL/uL    Hemoglobin 12.7 12.0 - 15.0 g/dL   HCT 40.8 36.0 - 46.0 %   MCV 76.7 (L) 80.0 - 100.0 fL   MCH 23.9 (L) 26.0 - 34.0 pg   MCHC 31.1 30.0 - 36.0 g/dL   RDW 16.0 (H) 11.5 - 15.5 %   Platelets 332 150 - 400 K/uL   nRBC 0.0 0.0 - 0.2 %   Neutrophils Relative % 34 %   Neutro Abs 1.8 1.7 - 7.7 K/uL   Lymphocytes Relative 57 %   Lymphs Abs 3.0 0.7 - 4.0 K/uL   Monocytes Relative 7 %   Monocytes Absolute 0.3 0.1 - 1.0 K/uL   Eosinophils Relative 2 %   Eosinophils Absolute 0.1 0.0 - 0.5 K/uL   Basophils Relative 0 %   Basophils Absolute 0.0 0.0 - 0.1 K/uL   Immature Granulocytes 0 %   Abs Immature Granulocytes 0.01 0.00 - 0.07 K/uL    Comment: Performed at [facility], [address], [place], [place] [address]  Comprehensive metabolic panel     Status: Abnormal   Collection Time: 12/21/20 12:54 PM  Result Value Ref Range   Sodium 139 135 - 145 mmol/L   Potassium 3.9 3.5 - 5.1 mmol/L   Chloride 105 98 - 111 mmol/L   CO2 25 22 - 32 mmol/L   Glucose, Bld 106 (H) 70 - 99 mg/dL  Comment: Glucose reference range applies only to samples taken after fasting for at least 8 hours.   BUN 8 8 - 23 mg/dL   Creatinine, Ser 0.87 0.44 - 1.00 mg/dL   Calcium 9.9 8.9 - 10.3 mg/dL   Total Protein 6.9 6.5 - 8.1 g/dL   Albumin 4.0 3.5 - 5.0 g/dL   AST 17 15 - 41 U/L   ALT 11 0 - 44 U/L   Alkaline Phosphatase 61 38 - 126 U/L   Total Bilirubin 0.9 0.3 - 1.2 mg/dL   GFR, Estimated >60 >60 mL/min    Comment: (NOTE) Calculated using the CKD-EPI Creatinine Equation (2021)    Anion gap 9 5 - 15    Comment: Performed at [facility] [address] [place], [place] [address]    MR BRAIN W WO CONTRAST  Result Date: 12/21/2020 CLINICAL DATA:  Dementia without behavioral disturbance, unspecified dementia type. EXAM: MRI HEAD WITHOUT AND WITH CONTRAST TECHNIQUE: Multiplanar, multiecho pulse sequences of the brain and surrounding structures were obtained without and with intravenous contrast.  Additionally, using NeuroQuant software a 3D volumetric analysis of the brain was performed and is compared to a normative database adjusted for age, gender and intracranial volume. CONTRAST:  28mL GADAVIST GADOBUTROL 1 MMOL/ML IV SOLN COMPARISON:  No pertinent prior exams available for comparison. FINDINGS: Brain: Subjectively, there is mild generalized cerebral and cerebellar atrophy. Large enhancing extra-axial dural-based mass along the left aspect of the mid-to-anterior falx, measuring 6.2 x 3.3 x 4.8 cm (for instance as seen on series 12, image 20) (series 13, image 21). There are irregular foci of SWI signal loss within the mass, likely reflecting a combination of mineralization and flow voids. The imaging features are most compatible with meningioma. Significant mass effect upon the underlying left frontal lobe with partial effacement of the left greater than right lateral ventricles, and 6 mm rightward midline shift measured at the level of the septum pellucidum. Severe surrounding vasogenic edema within the left cerebral hemisphere. A flow void is maintained within the superior sagittal sinus, and there is no evidence of superior sagittal sinus invasion at this time. Background moderate multifocal T2 FLAIR hyperintense signal abnormality within the cerebral white matter, nonspecific but compatible with chronic small-vessel ischemic disease. Chronic small-vessel infarct within the right basal ganglia/anterior limb of right internal capsule. There is no acute infarct. No extra-axial fluid collection. Vascular: Maintained flow voids within the proximal large arterial vessels. Skull and upper cervical spine: No focal suspicious marrow lesion. Trace C3-C4 grade 1 anterolisthesis. Sinuses/Orbits: Visualized orbits show no acute finding. Minimal bilateral ethmoid sinus mucosal thickening. Other: Trace bilateral mastoid effusions. NeuroQuant Findings: Volumetric analysis of the brain was performed, with a fully  detailed report in [facility]. Briefly, the comparison with age and gender matched reference reveals a whole brain volume normative percentile of 99. However, the presence of a large left frontal mass limits reliability of this data. Impression #1 called by telephone at the time of interpretation on 12/21/2020 at approximately 12:40 am to provider [facility] , who verbally acknowledged these results. Dr. Manuella Ghazi contact at the imaging center directly to provided the patient further direction. IMPRESSION: 6.2 x 3.3 x 4.8 cm dural-based mass along the left aspect of the mid-to-anterior falx, as described and likely reflecting a large meningioma. Significant mass effect upon the underlying left frontal lobe with partial effacement of the left greater than right lateral ventricles, and 6 mm rightward midline shift measured at the level of  the septum pellucidum. Severe surrounding vasogenic edema within the left cerebral hemisphere. No evidence of superior sagittal sinus invasion at this time. Background moderate chronic small vessel ischemic changes within the cerebral white matter. Chronic small-vessel infarct within the anterior limb of right internal capsule/right basal ganglia. NeuroQuant volumetric analysis of the brain, see details on [employer]. Briefly, the comparison with age and gender matched reference reveals a whole brain volume normative percentile of 99. However, the presence of a large left frontal mass limits reliability of this data. Subjectively, there is mild for age generalized cerebral and cerebellar atrophy. Electronically Signed   By: Kellie Simmering D.O.   On: 12/21/2020 12:56    Pertinent items noted in HPI and remainder of comprehensive ROS otherwise negative. Blood pressure (!) 168/58, pulse (!) 57, temperature 98.3 F (36.8 C), temperature source Oral, resp. rate 14, SpO2 100 %. The patient is awake and alert.  She is oriented to person and place and year.  She is pleasantly confused in  general.  Speech is fluent.  Judgment insight appear reasonably intact.  Cranial nerve function normal bilateral.  Motor examination reveals some minimal pronator drift on the right side otherwise the patient has intact motor strength bilaterally.  Sensory examination is nonfocal.  Deep tendon raises are normal active.  Patient's right toes are upgoing to plantar stimulation.  Gait is not tested today peer examination head ears eyes nose throat is unremarked.  Chest and abdomen are benign.  Extremities are free of major deformity.  Assessment/Plan: Left frontal parasagittal meningioma with cognitive decline.  I discussed situation with the patient and her daughter.  I recommended discharge home.  We will place her on Decadron 2 mg p.o. twice daily and she will follow-up with me in my office in a little over a week to discuss surgical resection.  Cooper Render Mortimer Bair 12/21/2020, 5:23 PM

## 2020-12-21 NOTE — ED Provider Notes (Addendum)
Emergency Medicine Provider Triage Evaluation Note  Ann Chaney , a 75 y.o. female  was evaluated in triage.  Pt complains of insidious onset of confusion, difficulty with word finding. Outpt MRI completed and they were advised to come to the ED immediately.  Review of Systems  Positive: Confusion, Abnormal MRI  Negative: fever  Physical Exam  BP (!) 194/54 (BP Location: Left Arm)   Pulse (!) 58   Temp 98.3 F (36.8 C) (Oral)   Resp 16   SpO2 100%  Gen:   Awake, no distress   Resp:  Normal effort  MSK:   Moves extremities without difficulty  Other:  Ambulatory with steady gait, disoriented  Medical Decision Making  Medically screening exam initiated at 12:59 PM.  Appropriate orders placed.  Ann Chaney was informed that the remainder of the evaluation will be completed by another provider, this initial triage assessment does not replace that evaluation, and the importance of remaining in the ED until their evaluation is complete.  12:50 PM Discussed case with Dr. Armandina Gemma with radiology who communicated results of MRI brain   Rodney Booze, PA-C 12/21/20 [address] Karryn Kosinski S, PA-C 12/21/20 1259    Charlesetta Shanks, MD 12/21/20 1408

## 2020-12-21 NOTE — Discharge Instructions (Addendum)
Take dexamethasone as prescribed.  Follow-up with neurosurgery next week.  Please return if symptoms worsen.

## 2020-12-27 ENCOUNTER — Other Ambulatory Visit: Payer: Self-pay

## 2021-01-09 ENCOUNTER — Other Ambulatory Visit: Payer: Self-pay | Admitting: Neurosurgery

## 2021-01-09 DIAGNOSIS — D329 Benign neoplasm of meninges, unspecified: Secondary | ICD-10-CM | POA: Diagnosis not present

## 2021-01-10 NOTE — Pre-Procedure Instructions (Signed)
Surgical Instructions   Your procedure is scheduled on Monday, October 24th. Report to [facility] Main Entrance "A" at 11:30 A.M., then check in with the Admitting office. Call this number if you have problems the morning of surgery: [phone]   If you have any questions prior to your surgery date call [phone]: Open Monday-Friday 8am-4pm   Remember: Do not eat or drink after midnight the night before your surgery     Take these medicines the morning of surgery with A SIP OF WATER  dexamethasone (DECADRON) methimazole (TAPAZOLE) metoprolol succinate (TOPROL-XL)  If needed: acetaminophen (TYLENOL)    WHAT DO I DO ABOUT MY DIABETES MEDICATION?   Do not take glipiZIDE (GLUCOTROL) or  pioglitazone (ACTOS) or  metFORMIN (GLUCOPHAGE) the morning of surgery.    HOW TO MANAGE YOUR DIABETES BEFORE AND AFTER SURGERY  Why is it important to control my blood sugar before and after surgery? Improving blood sugar levels before and after surgery helps healing and can limit problems. A way of improving blood sugar control is eating a healthy diet by:  Eating less sugar and carbohydrates  Increasing activity/exercise  Talking with your doctor about reaching your blood sugar goals High blood sugars (greater than 180 mg/dL) can raise your risk of infections and slow your recovery, so you will need to focus on controlling your diabetes during the weeks before surgery. Make sure that the doctor who takes care of your diabetes knows about your planned surgery including the date and location.  How do I manage my blood sugar before surgery? Check your blood sugar at least 4 times a day, starting 2 days before surgery, to make sure that the level is not too high or low.  Check your blood sugar the morning of your surgery when you wake up and every 2 hours until you get to the Short Stay unit.  If your blood sugar is less than 70 mg/dL, you will need to treat for low blood sugar: Do not  take insulin. Treat a low blood sugar (less than 70 mg/dL) with  cup of clear juice (cranberry or apple), 4 glucose tablets, OR glucose gel. Recheck blood sugar in 15 minutes after treatment (to make sure it is greater than 70 mg/dL). If your blood sugar is not greater than 70 mg/dL on recheck, call [phone] for further instructions. Report your blood sugar to the short stay nurse when you get to Short Stay.  If you are admitted to the hospital after surgery: Your blood sugar will be checked by the staff and you will probably be given insulin after surgery (instead of oral diabetes medicines) to make sure you have good blood sugar levels. The goal for blood sugar control after surgery is 80-180 mg/dL.   As of today, STOP taking any Aspirin (unless otherwise instructed by your surgeon) Aleve, Naproxen, Ibuprofen, Motrin, Advil, Goody's, BC's, all herbal medications, fish oil, and all vitamins.                     Do NOT Smoke (Tobacco/Vaping) or drink Alcohol 24 hours prior to your procedure.  If you use a CPAP at night, you may bring all equipment for your overnight stay.   Contacts, glasses, piercing's, hearing aid's, dentures or partials may not be worn into surgery, please bring cases for these belongings.    For patients admitted to the hospital, discharge time will be determined by your treatment team.   Patients discharged the day of surgery will  not be allowed to drive home, and someone needs to stay with them for 24 hours.  NO VISITORS WILL BE ALLOWED IN PRE-OP WHERE PATIENTS GET READY FOR SURGERY.  ONLY 1 SUPPORT PERSON MAY BE PRESENT IN THE WAITING ROOM WHILE YOU ARE IN SURGERY.  IF YOU ARE TO BE ADMITTED, ONCE YOU ARE IN YOUR ROOM YOU WILL BE ALLOWED TWO (2) VISITORS.  Minor children may have two parents present. Special consideration for safety and communication needs will be reviewed on a case by case basis.   Special instructions:   [place]- Preparing For  Surgery  Before surgery, you can play an important role. Because skin is not sterile, your skin needs to be as free of germs as possible. You can reduce the number of germs on your skin by washing with CHG (chlorahexidine gluconate) Soap before surgery.  CHG is an antiseptic cleaner which kills germs and bonds with the skin to continue killing germs even after washing.    Oral Hygiene is also important to reduce your risk of infection.  Remember - BRUSH YOUR TEETH THE MORNING OF SURGERY WITH YOUR REGULAR TOOTHPASTE  Please do not use if you have an allergy to CHG or antibacterial soaps. If your skin becomes reddened/irritated stop using the CHG.  Do not shave (including legs and underarms) for at least 48 hours prior to first CHG shower. It is OK to shave your face.  Please follow these instructions carefully.   Shower the NIGHT BEFORE SURGERY and the MORNING OF SURGERY  If you chose to wash your hair, wash your hair first as usual with your normal shampoo.  After you shampoo, rinse your hair and body thoroughly to remove the shampoo.  Use CHG Soap as you would any other liquid soap. You can apply CHG directly to the skin and wash gently with a scrungie or a clean washcloth.   Apply the CHG Soap to your body ONLY FROM THE NECK DOWN.  Do not use on open wounds or open sores. Avoid contact with your eyes, ears, mouth and genitals (private parts). Wash Face and genitals (private parts)  with your normal soap.   Wash thoroughly, paying special attention to the area where your surgery will be performed.  Thoroughly rinse your body with warm water from the neck down.  DO NOT shower/wash with your normal soap after using and rinsing off the CHG Soap.  Pat yourself dry with a CLEAN TOWEL.  Wear CLEAN PAJAMAS to bed the night before surgery  Place CLEAN SHEETS on your bed the night before your surgery  DO NOT SLEEP WITH PETS.   Day of Surgery: Shower with CHG soap. Do not wear jewelry,  make up, nail polish, gel polish, artificial nails, or any other type of covering on natural nails including finger and toenails. If patients have artificial nails, gel coating, etc. that need to be removed by a nail salon please have this removed prior to surgery. Surgery may need to be canceled/delayed if the surgeon/ anesthesia feels like the patient is unable to be adequately monitored. Do not wear lotions, powders, perfumes, or deodorant. Do not shave 48 hours prior to surgery.   Do not bring valuables to the hospital. [facility] is not responsible for any belongings or valuables. Wear Clean/Comfortable clothing the morning of surgery Remember to brush your teeth WITH YOUR REGULAR TOOTHPASTE.   Please read over the following fact sheets that you were given.   3 days prior to your  procedure or After your COVID test   You are not required to quarantine however you are required to wear a well-fitting mask when you are out and around people not in your household. If your mask becomes wet or soiled, replace with a new one.   Wash your hands often with soap and water for 20 seconds or clean your hands with an alcohol-based hand sanitizer that contains at least 60% alcohol.   Do not share personal items.   Notify your provider:  o if you are in close contact with someone who has COVID  o or if you develop a fever of 100.4 or greater, sneezing, cough, sore throat, shortness of breath or body aches.

## 2021-01-11 ENCOUNTER — Other Ambulatory Visit: Payer: Self-pay

## 2021-01-11 ENCOUNTER — Encounter (HOSPITAL_COMMUNITY)
Admission: RE | Admit: 2021-01-11 | Discharge: 2021-01-11 | Disposition: A | Discharge status: Home / Self Care | Payer: Medicare Other | Source: Ambulatory Visit | Attending: Neurosurgery | Admitting: Neurosurgery

## 2021-01-11 ENCOUNTER — Encounter (HOSPITAL_COMMUNITY): Payer: Self-pay

## 2021-01-11 VITALS — BP 161/79 | HR 52 | Temp 97.9°F | Resp 19 | Ht 65.0 in | Wt 121.0 lb

## 2021-01-11 DIAGNOSIS — Z803 Family history of malignant neoplasm of breast: Secondary | ICD-10-CM | POA: Diagnosis not present

## 2021-01-11 DIAGNOSIS — D329 Benign neoplasm of meninges, unspecified: Secondary | ICD-10-CM | POA: Diagnosis not present

## 2021-01-11 DIAGNOSIS — E119 Type 2 diabetes mellitus without complications: Secondary | ICD-10-CM | POA: Diagnosis not present

## 2021-01-11 DIAGNOSIS — R4182 Altered mental status, unspecified: Secondary | ICD-10-CM | POA: Diagnosis not present

## 2021-01-11 DIAGNOSIS — D320 Benign neoplasm of cerebral meninges: Secondary | ICD-10-CM | POA: Diagnosis not present

## 2021-01-11 DIAGNOSIS — E1159 Type 2 diabetes mellitus with other circulatory complications: Secondary | ICD-10-CM | POA: Diagnosis not present

## 2021-01-11 DIAGNOSIS — Z79899 Other long term (current) drug therapy: Secondary | ICD-10-CM | POA: Diagnosis not present

## 2021-01-11 DIAGNOSIS — Z20822 Contact with and (suspected) exposure to covid-19: Secondary | ICD-10-CM | POA: Insufficient documentation

## 2021-01-11 DIAGNOSIS — D496 Neoplasm of unspecified behavior of brain: Secondary | ICD-10-CM | POA: Diagnosis not present

## 2021-01-11 DIAGNOSIS — Z01818 Encounter for other preprocedural examination: Secondary | ICD-10-CM | POA: Insufficient documentation

## 2021-01-11 DIAGNOSIS — I10 Essential (primary) hypertension: Secondary | ICD-10-CM | POA: Diagnosis not present

## 2021-01-11 DIAGNOSIS — Z7984 Long term (current) use of oral hypoglycemic drugs: Secondary | ICD-10-CM | POA: Diagnosis not present

## 2021-01-11 DIAGNOSIS — Z90710 Acquired absence of both cervix and uterus: Secondary | ICD-10-CM | POA: Diagnosis not present

## 2021-01-11 DIAGNOSIS — Z7983 Long term (current) use of bisphosphonates: Secondary | ICD-10-CM | POA: Diagnosis not present

## 2021-01-11 DIAGNOSIS — G8191 Hemiplegia, unspecified affecting right dominant side: Secondary | ICD-10-CM | POA: Diagnosis not present

## 2021-01-11 DIAGNOSIS — Z87891 Personal history of nicotine dependence: Secondary | ICD-10-CM | POA: Diagnosis not present

## 2021-01-11 DIAGNOSIS — Z833 Family history of diabetes mellitus: Secondary | ICD-10-CM | POA: Diagnosis not present

## 2021-01-11 DIAGNOSIS — E785 Hyperlipidemia, unspecified: Secondary | ICD-10-CM | POA: Diagnosis not present

## 2021-01-11 DIAGNOSIS — G936 Cerebral edema: Secondary | ICD-10-CM | POA: Diagnosis not present

## 2021-01-11 DIAGNOSIS — R569 Unspecified convulsions: Secondary | ICD-10-CM | POA: Diagnosis not present

## 2021-01-11 LAB — CBC WITH DIFFERENTIAL/PLATELET
Abs Immature Granulocytes: 0.07 10*3/uL (ref 0.00–0.07)
Basophils Absolute: 0 10*3/uL (ref 0.0–0.1)
Basophils Relative: 0 %
Eosinophils Absolute: 0 10*3/uL (ref 0.0–0.5)
Eosinophils Relative: 0 %
HCT: 42.2 % (ref 36.0–46.0)
Hemoglobin: 13.1 g/dL (ref 12.0–15.0)
Immature Granulocytes: 1 %
Lymphocytes Relative: 25 %
Lymphs Abs: 2.3 10*3/uL (ref 0.7–4.0)
MCH: 24 pg — ABNORMAL LOW (ref 26.0–34.0)
MCHC: 31 g/dL (ref 30.0–36.0)
MCV: 77.3 fL — ABNORMAL LOW (ref 80.0–100.0)
Monocytes Absolute: 0.8 10*3/uL (ref 0.1–1.0)
Monocytes Relative: 8 %
Neutro Abs: 6 10*3/uL (ref 1.7–7.7)
Neutrophils Relative %: 66 %
Platelets: 341 10*3/uL (ref 150–400)
RBC: 5.46 MIL/uL — ABNORMAL HIGH (ref 3.87–5.11)
RDW: 17.5 % — ABNORMAL HIGH (ref 11.5–15.5)
WBC: 9.1 10*3/uL (ref 4.0–10.5)
nRBC: 0 % (ref 0.0–0.2)

## 2021-01-11 LAB — TYPE AND SCREEN
ABO/RH(D): O POS
Antibody Screen: NEGATIVE

## 2021-01-11 LAB — BASIC METABOLIC PANEL
Anion gap: 10 (ref 5–15)
BUN: 16 mg/dL (ref 8–23)
CO2: 25 mmol/L (ref 22–32)
Calcium: 9.6 mg/dL (ref 8.9–10.3)
Chloride: 102 mmol/L (ref 98–111)
Creatinine, Ser: 0.91 mg/dL (ref 0.44–1.00)
GFR, Estimated: >60 mL/min (ref >60)
Glucose, Bld: 134 mg/dL — ABNORMAL HIGH (ref 70–99)
Potassium: 4.1 mmol/L (ref 3.5–5.1)
Sodium: 137 mmol/L (ref 135–145)

## 2021-01-11 LAB — GLUCOSE, CAPILLARY
Glucose-Capillary: 69 mg/dL — ABNORMAL LOW (ref 70–99)
Glucose-Capillary: 106 mg/dL — ABNORMAL HIGH (ref 70–99)

## 2021-01-11 LAB — SARS CORONAVIRUS 2 (TAT 6-24 HRS): SARS Coronavirus 2: NEGATIVE

## 2021-01-11 NOTE — Progress Notes (Signed)
Hypoglycemic Event  CBG: 69  Treatment: 8 oz juice/soda  Symptoms: Shaky  Follow-up CBG: QMGN:0037 CBG Result:106  Possible Reasons for Event: Inadequate meal intake  Comments/MD notified:Pt alert and oriented. Reports she feels better and her symptoms have resolved.     [place], Marylene Land

## 2021-01-11 NOTE — Progress Notes (Signed)
Left detailed voicemails on both phone numbers listed for patient. Sister said she will call and let patient know new arrival time of 0600 on monday

## 2021-01-11 NOTE — Progress Notes (Signed)
PCP - Juline Patch MD Cardiologist - denies  PPM/ICD - denies Device Orders -  Rep Notified -   Chest x-ray - no EKG - 12/24/20 Stress Test - no ECHO - no Cardiac Cath - no  Sleep Study - denies CPAP - no  Fasting Blood Sugar - 100 Checks Blood Sugar once a day  Blood Thinner Instructions:n/a Aspirin Instructions:n/a  ERAS Protcol -no PRE-SURGERY Ensure or G2-   COVID TEST- 01/11/21   Anesthesia review: no  Patient denies shortness of breath, fever, cough and chest pain at PAT appointment   All instructions explained to the patient, with a verbal understanding of the material. Patient agrees to go over the instructions while at home for a better understanding. Patient also instructed to self quarantine after being tested for COVID-19. The opportunity to ask questions was provided.

## 2021-01-13 NOTE — Anesthesia Preprocedure Evaluation (Addendum)
Anesthesia Evaluation  Patient identified by MRN, date of birth, ID band Patient awake    Reviewed: Allergy & Precautions, NPO status , Patient's Chart, lab work & pertinent test results, reviewed documented beta blocker date and time   Airway Mallampati: II  TM Distance: >3 FB Neck ROM: Full    Dental  (+) Teeth Intact, Dental Advisory Given   Pulmonary Current Smoker and Patient abstained from smoking.,    Pulmonary exam normal breath sounds clear to auscultation       Cardiovascular hypertension, Pt. on medications and Pt. on home beta blockers Normal cardiovascular exam Rhythm:Regular Rate:Normal     Neuro/Psych PSYCHIATRIC DISORDERS Depression Meningioma    GI/Hepatic negative GI ROS, Neg liver ROS,   Endo/Other  diabetes, Type 2, Oral Hypoglycemic AgentsHyperthyroidism   Renal/GU negative Renal ROS     Musculoskeletal negative musculoskeletal ROS (+)   Abdominal   Peds  Hematology negative hematology ROS (+)   Anesthesia Other Findings   Reproductive/Obstetrics                            Anesthesia Physical Anesthesia Plan  ASA: 3  Anesthesia Plan: General   Post-op Pain Management:    Induction: Intravenous  PONV Risk Score and Plan: 3 and Dexamethasone, Ondansetron and Treatment may vary due to age or medical condition  Airway Management Planned: Oral ETT  Additional Equipment: Arterial line  Intra-op Plan:   Post-operative Plan: Possible Post-op intubation/ventilation  Informed Consent: I have reviewed the patients History and Physical, chart, labs and discussed the procedure including the risks, benefits and alternatives for the proposed anesthesia with the patient or authorized representative who has indicated his/her understanding and acceptance.     Dental advisory given  Plan Discussed with: CRNA  Anesthesia Plan Comments: (2 large bore PIV.  CVL if unable  to obtain adequate PIV access.)      Anesthesia Quick Evaluation

## 2021-01-14 ENCOUNTER — Inpatient Hospital Stay (HOSPITAL_COMMUNITY)
Admission: RE | Admit: 2021-01-14 | Discharge: 2021-01-23 | DRG: 025 | Disposition: A | Discharge status: Rehabilitation Facility | Payer: Medicare Other | Attending: Neurosurgery | Admitting: Neurosurgery

## 2021-01-14 ENCOUNTER — Inpatient Hospital Stay (HOSPITAL_COMMUNITY): Payer: Medicare Other | Admitting: Certified Registered Nurse Anesthetist

## 2021-01-14 ENCOUNTER — Inpatient Hospital Stay (HOSPITAL_COMMUNITY)
Admission: RE | Disposition: A | Discharge status: Rehabilitation Facility | Payer: Self-pay | Source: Home / Self Care | Attending: Neurosurgery

## 2021-01-14 ENCOUNTER — Inpatient Hospital Stay (HOSPITAL_COMMUNITY): Payer: Medicare Other

## 2021-01-14 ENCOUNTER — Other Ambulatory Visit: Payer: Self-pay

## 2021-01-14 ENCOUNTER — Encounter (HOSPITAL_COMMUNITY): Payer: Self-pay | Admitting: Neurosurgery

## 2021-01-14 DIAGNOSIS — D496 Neoplasm of unspecified behavior of brain: Secondary | ICD-10-CM | POA: Diagnosis not present

## 2021-01-14 DIAGNOSIS — Z79899 Other long term (current) drug therapy: Secondary | ICD-10-CM

## 2021-01-14 DIAGNOSIS — Z87891 Personal history of nicotine dependence: Secondary | ICD-10-CM | POA: Diagnosis not present

## 2021-01-14 DIAGNOSIS — D62 Acute posthemorrhagic anemia: Secondary | ICD-10-CM | POA: Diagnosis not present

## 2021-01-14 DIAGNOSIS — Z20822 Contact with and (suspected) exposure to covid-19: Secondary | ICD-10-CM | POA: Diagnosis present

## 2021-01-14 DIAGNOSIS — D329 Benign neoplasm of meninges, unspecified: Secondary | ICD-10-CM | POA: Diagnosis present

## 2021-01-14 DIAGNOSIS — Z90710 Acquired absence of both cervix and uterus: Secondary | ICD-10-CM

## 2021-01-14 DIAGNOSIS — E785 Hyperlipidemia, unspecified: Secondary | ICD-10-CM | POA: Diagnosis present

## 2021-01-14 DIAGNOSIS — Z833 Family history of diabetes mellitus: Secondary | ICD-10-CM

## 2021-01-14 DIAGNOSIS — Z803 Family history of malignant neoplasm of breast: Secondary | ICD-10-CM | POA: Diagnosis not present

## 2021-01-14 DIAGNOSIS — E1159 Type 2 diabetes mellitus with other circulatory complications: Secondary | ICD-10-CM | POA: Diagnosis not present

## 2021-01-14 DIAGNOSIS — R4182 Altered mental status, unspecified: Secondary | ICD-10-CM | POA: Diagnosis not present

## 2021-01-14 DIAGNOSIS — I10 Essential (primary) hypertension: Secondary | ICD-10-CM | POA: Diagnosis not present

## 2021-01-14 DIAGNOSIS — G936 Cerebral edema: Secondary | ICD-10-CM | POA: Diagnosis not present

## 2021-01-14 DIAGNOSIS — E1169 Type 2 diabetes mellitus with other specified complication: Secondary | ICD-10-CM | POA: Diagnosis not present

## 2021-01-14 DIAGNOSIS — K5901 Slow transit constipation: Secondary | ICD-10-CM | POA: Diagnosis not present

## 2021-01-14 DIAGNOSIS — G8191 Hemiplegia, unspecified affecting right dominant side: Secondary | ICD-10-CM | POA: Diagnosis present

## 2021-01-14 DIAGNOSIS — D320 Benign neoplasm of cerebral meninges: Principal | ICD-10-CM | POA: Diagnosis present

## 2021-01-14 DIAGNOSIS — Z7983 Long term (current) use of bisphosphonates: Secondary | ICD-10-CM | POA: Diagnosis not present

## 2021-01-14 DIAGNOSIS — Z86018 Personal history of other benign neoplasm: Secondary | ICD-10-CM | POA: Diagnosis not present

## 2021-01-14 DIAGNOSIS — E119 Type 2 diabetes mellitus without complications: Secondary | ICD-10-CM | POA: Diagnosis not present

## 2021-01-14 DIAGNOSIS — R569 Unspecified convulsions: Secondary | ICD-10-CM | POA: Diagnosis not present

## 2021-01-14 DIAGNOSIS — Z9889 Other specified postprocedural states: Secondary | ICD-10-CM | POA: Diagnosis not present

## 2021-01-14 DIAGNOSIS — E669 Obesity, unspecified: Secondary | ICD-10-CM | POA: Diagnosis not present

## 2021-01-14 DIAGNOSIS — R5381 Other malaise: Secondary | ICD-10-CM | POA: Diagnosis not present

## 2021-01-14 DIAGNOSIS — Z7984 Long term (current) use of oral hypoglycemic drugs: Secondary | ICD-10-CM | POA: Diagnosis not present

## 2021-01-14 HISTORY — PX: CRANIOTOMY: SHX93

## 2021-01-14 LAB — MRSA NEXT GEN BY PCR, NASAL: MRSA by PCR Next Gen: NOT DETECTED

## 2021-01-14 LAB — GLUCOSE, CAPILLARY
Glucose-Capillary: 101 mg/dL — ABNORMAL HIGH (ref 70–99)
Glucose-Capillary: 157 mg/dL — ABNORMAL HIGH (ref 70–99)
Glucose-Capillary: 239 mg/dL — ABNORMAL HIGH (ref 70–99)

## 2021-01-14 LAB — ABO/RH: ABO/RH(D): O POS

## 2021-01-14 SURGERY — CRANIOTOMY TUMOR EXCISION
Anesthesia: General | Site: Head | Laterality: Left

## 2021-01-14 MED ORDER — ACETAMINOPHEN 325 MG PO TABS
650.0000 mg | ORAL_TABLET | ORAL | Status: DC | PRN
  Administered 2021-01-19: 650 mg via ORAL
  Filled 2021-01-14: qty 2

## 2021-01-14 MED ORDER — PROMETHAZINE HCL 25 MG PO TABS
12.5000 mg | ORAL_TABLET | ORAL | Status: DC | PRN
  Filled 2021-01-14: qty 1

## 2021-01-14 MED ORDER — LABETALOL HCL 5 MG/ML IV SOLN
INTRAVENOUS | Status: AC
  Filled 2021-01-14: qty 4

## 2021-01-14 MED ORDER — DEXAMETHASONE SODIUM PHOSPHATE 10 MG/ML IJ SOLN
10.0000 mg | Freq: Once | INTRAMUSCULAR | Status: AC
  Administered 2021-01-14: 10 mg via INTRAVENOUS
  Filled 2021-01-14: qty 1

## 2021-01-14 MED ORDER — CHLORHEXIDINE GLUCONATE CLOTH 2 % EX PADS: 6.0000 | MEDICATED_PAD | Freq: Once | CUTANEOUS | Status: DC

## 2021-01-14 MED ORDER — PHENYLEPHRINE HCL-NACL 20-0.9 MG/250ML-% IV SOLN
INTRAVENOUS | Status: AC
  Filled 2021-01-14: qty 250

## 2021-01-14 MED ORDER — THROMBIN 20000 UNITS EX SOLR
CUTANEOUS | Status: AC
  Filled 2021-01-14: qty 20000

## 2021-01-14 MED ORDER — CHLORHEXIDINE GLUCONATE CLOTH 2 % EX PADS
6.0000 | MEDICATED_PAD | Freq: Every day | CUTANEOUS | Status: DC
  Administered 2021-01-14 – 2021-01-23 (×10): 6 via TOPICAL

## 2021-01-14 MED ORDER — FENTANYL CITRATE (PF) 250 MCG/5ML IJ SOLN
INTRAMUSCULAR | Status: AC
  Filled 2021-01-14: qty 5

## 2021-01-14 MED ORDER — LIDOCAINE 2% (20 MG/ML) 5 ML SYRINGE
INTRAMUSCULAR | Status: DC | PRN
  Administered 2021-01-14: 80 mg via INTRAVENOUS

## 2021-01-14 MED ORDER — DEXAMETHASONE SODIUM PHOSPHATE 4 MG/ML IJ SOLN
4.0000 mg | Freq: Four times a day (QID) | INTRAMUSCULAR | Status: AC
  Administered 2021-01-15 – 2021-01-16 (×4): 4 mg via INTRAVENOUS
  Filled 2021-01-14 (×4): qty 1

## 2021-01-14 MED ORDER — CEFAZOLIN SODIUM-DEXTROSE 2-4 GM/100ML-% IV SOLN
2.0000 g | Freq: Three times a day (TID) | INTRAVENOUS | Status: AC
  Administered 2021-01-14 (×2): 2 g via INTRAVENOUS
  Filled 2021-01-14 (×2): qty 100

## 2021-01-14 MED ORDER — CEFAZOLIN SODIUM-DEXTROSE 2-4 GM/100ML-% IV SOLN: 2.0000 g | INTRAVENOUS | Status: DC

## 2021-01-14 MED ORDER — METOPROLOL SUCCINATE ER 50 MG PO TB24
50.0000 mg | ORAL_TABLET | Freq: Every day | ORAL | Status: DC
  Administered 2021-01-15 – 2021-01-23 (×8): 50 mg via ORAL
  Filled 2021-01-14 (×10): qty 1

## 2021-01-14 MED ORDER — 0.9 % SODIUM CHLORIDE (POUR BTL) OPTIME
TOPICAL | Status: DC | PRN
  Administered 2021-01-14: 3000 mL

## 2021-01-14 MED ORDER — ONDANSETRON HCL 4 MG/2ML IJ SOLN
INTRAMUSCULAR | Status: AC
  Filled 2021-01-14: qty 2

## 2021-01-14 MED ORDER — PANTOPRAZOLE SODIUM 40 MG IV SOLR
40.0000 mg | Freq: Every day | INTRAVENOUS | Status: DC
  Administered 2021-01-14: 40 mg via INTRAVENOUS
  Filled 2021-01-14: qty 40

## 2021-01-14 MED ORDER — LATANOPROST 0.005 % OP SOLN
1.0000 [drp] | Freq: Every day | OPHTHALMIC | Status: DC
  Administered 2021-01-14 – 2021-01-22 (×9): 1 [drp] via OPHTHALMIC
  Filled 2021-01-14: qty 2.5

## 2021-01-14 MED ORDER — ACETAMINOPHEN 500 MG PO TABS
1000.0000 mg | ORAL_TABLET | Freq: Once | ORAL | Status: AC
  Administered 2021-01-14: 1000 mg via ORAL
  Filled 2021-01-14: qty 2

## 2021-01-14 MED ORDER — CEFAZOLIN SODIUM-DEXTROSE 2-4 GM/100ML-% IV SOLN
2.0000 g | INTRAVENOUS | Status: AC
  Administered 2021-01-14: 2 g via INTRAVENOUS
  Filled 2021-01-14: qty 100

## 2021-01-14 MED ORDER — LIDOCAINE 2% (20 MG/ML) 5 ML SYRINGE
INTRAMUSCULAR | Status: AC
  Filled 2021-01-14: qty 5

## 2021-01-14 MED ORDER — ORAL CARE MOUTH RINSE: 15.0000 mL | Freq: Once | OROMUCOSAL | Status: AC

## 2021-01-14 MED ORDER — ACETAMINOPHEN 650 MG RE SUPP: 650.0000 mg | RECTAL | Status: DC | PRN

## 2021-01-14 MED ORDER — DEXAMETHASONE SODIUM PHOSPHATE 4 MG/ML IJ SOLN
4.0000 mg | Freq: Three times a day (TID) | INTRAMUSCULAR | Status: DC
  Administered 2021-01-16 – 2021-01-18 (×5): 4 mg via INTRAVENOUS
  Filled 2021-01-14 (×6): qty 1

## 2021-01-14 MED ORDER — HYDROMORPHONE HCL 1 MG/ML IJ SOLN: 0.5000 mg | INTRAMUSCULAR | Status: DC | PRN

## 2021-01-14 MED ORDER — LIDOCAINE-EPINEPHRINE 1 %-1:100000 IJ SOLN
INTRAMUSCULAR | Status: AC
  Filled 2021-01-14: qty 1

## 2021-01-14 MED ORDER — DEXAMETHASONE SODIUM PHOSPHATE 10 MG/ML IJ SOLN
INTRAMUSCULAR | Status: AC
  Filled 2021-01-14: qty 1

## 2021-01-14 MED ORDER — THROMBIN 5000 UNITS EX SOLR
OROMUCOSAL | Status: DC | PRN
  Administered 2021-01-14 (×3): 5 mL via TOPICAL

## 2021-01-14 MED ORDER — BACITRACIN ZINC 500 UNIT/GM EX OINT
TOPICAL_OINTMENT | CUTANEOUS | Status: DC | PRN
  Administered 2021-01-14: 1 via TOPICAL

## 2021-01-14 MED ORDER — PHENYLEPHRINE HCL-NACL 20-0.9 MG/250ML-% IV SOLN
INTRAVENOUS | Status: DC | PRN
  Administered 2021-01-14: 20 ug/min via INTRAVENOUS

## 2021-01-14 MED ORDER — LEVETIRACETAM IN NACL 500 MG/100ML IV SOLN
500.0000 mg | Freq: Two times a day (BID) | INTRAVENOUS | Status: DC
  Administered 2021-01-14 – 2021-01-18 (×8): 500 mg via INTRAVENOUS
  Filled 2021-01-14 (×9): qty 100

## 2021-01-14 MED ORDER — SODIUM CHLORIDE 0.9 % IV SOLN: INTRAVENOUS | Status: DC | PRN

## 2021-01-14 MED ORDER — ONDANSETRON HCL 4 MG/2ML IJ SOLN: 4.0000 mg | Freq: Once | INTRAMUSCULAR | Status: DC | PRN

## 2021-01-14 MED ORDER — LISINOPRIL 20 MG PO TABS
40.0000 mg | ORAL_TABLET | Freq: Every day | ORAL | Status: DC
  Administered 2021-01-15 – 2021-01-23 (×9): 40 mg via ORAL
  Filled 2021-01-14 (×9): qty 2

## 2021-01-14 MED ORDER — CLEVIDIPINE BUTYRATE 0.5 MG/ML IV EMUL
0.0000 mg/h | INTRAVENOUS | Status: DC
Start: 2021-01-14 — End: 2021-01-22
  Administered 2021-01-14 (×2): 4 mg/h via INTRAVENOUS
  Administered 2021-01-14: 1 mg/h via INTRAVENOUS
  Administered 2021-01-15 (×2): 6 mg/h via INTRAVENOUS
  Administered 2021-01-15: 8 mg/h via INTRAVENOUS
  Administered 2021-01-15: 5 mg/h via INTRAVENOUS
  Administered 2021-01-15 (×2): 6 mg/h via INTRAVENOUS
  Administered 2021-01-16: 5 mg/h via INTRAVENOUS
  Administered 2021-01-16: 7 mg/h via INTRAVENOUS
  Filled 2021-01-14 (×3): qty 50
  Filled 2021-01-14: qty 100
  Filled 2021-01-14 (×6): qty 50

## 2021-01-14 MED ORDER — PROPOFOL 10 MG/ML IV BOLUS
INTRAVENOUS | Status: AC
  Filled 2021-01-14: qty 20

## 2021-01-14 MED ORDER — THROMBIN 20000 UNITS EX SOLR
CUTANEOUS | Status: DC | PRN
  Administered 2021-01-14 (×2): 20 mL via TOPICAL

## 2021-01-14 MED ORDER — LEVETIRACETAM IN NACL 1000 MG/100ML IV SOLN
1000.0000 mg | INTRAVENOUS | Status: DC
  Filled 2021-01-14: qty 100

## 2021-01-14 MED ORDER — ONDANSETRON HCL 4 MG/2ML IJ SOLN
4.0000 mg | INTRAMUSCULAR | Status: DC | PRN
  Administered 2021-01-14 – 2021-01-15 (×3): 4 mg via INTRAVENOUS
  Filled 2021-01-14 (×3): qty 2

## 2021-01-14 MED ORDER — DEXAMETHASONE SODIUM PHOSPHATE 10 MG/ML IJ SOLN: 10.0000 mg | Freq: Once | INTRAMUSCULAR | Status: DC

## 2021-01-14 MED ORDER — SUGAMMADEX SODIUM 200 MG/2ML IV SOLN
INTRAVENOUS | Status: DC | PRN
Start: 2021-01-14 — End: 2021-01-14
  Administered 2021-01-14: 200 mg via INTRAVENOUS

## 2021-01-14 MED ORDER — MIDAZOLAM HCL 2 MG/2ML IJ SOLN
INTRAMUSCULAR | Status: AC
  Filled 2021-01-14: qty 2

## 2021-01-14 MED ORDER — LIDOCAINE-EPINEPHRINE 1 %-1:100000 IJ SOLN
INTRAMUSCULAR | Status: DC | PRN
  Administered 2021-01-14: 20 mL

## 2021-01-14 MED ORDER — SODIUM CHLORIDE 0.9 % IV SOLN: INTRAVENOUS | Status: DC

## 2021-01-14 MED ORDER — METFORMIN HCL 500 MG PO TABS
1000.0000 mg | ORAL_TABLET | Freq: Two times a day (BID) | ORAL | Status: DC
  Administered 2021-01-15 – 2021-01-23 (×16): 1000 mg via ORAL
  Filled 2021-01-14 (×16): qty 2

## 2021-01-14 MED ORDER — PIOGLITAZONE HCL 15 MG PO TABS
15.0000 mg | ORAL_TABLET | Freq: Every day | ORAL | Status: DC
  Administered 2021-01-15 – 2021-01-22 (×8): 15 mg via ORAL
  Filled 2021-01-14 (×10): qty 1

## 2021-01-14 MED ORDER — FENTANYL CITRATE (PF) 100 MCG/2ML IJ SOLN
INTRAMUSCULAR | Status: DC | PRN
  Administered 2021-01-14 (×5): 50 ug via INTRAVENOUS

## 2021-01-14 MED ORDER — ESMOLOL HCL 100 MG/10ML IV SOLN
INTRAVENOUS | Status: DC | PRN
  Administered 2021-01-14: 20 mg via INTRAVENOUS
  Administered 2021-01-14: 30 mg via INTRAVENOUS
  Administered 2021-01-14: 20 mg via INTRAVENOUS
  Administered 2021-01-14: 30 mg via INTRAVENOUS

## 2021-01-14 MED ORDER — CHLORHEXIDINE GLUCONATE 0.12 % MT SOLN
15.0000 mL | Freq: Once | OROMUCOSAL | Status: AC
  Administered 2021-01-14: 15 mL via OROMUCOSAL
  Filled 2021-01-14: qty 15

## 2021-01-14 MED ORDER — ADULT MULTIVITAMIN W/MINERALS CH
1.0000 | ORAL_TABLET | Freq: Every day | ORAL | Status: DC
  Administered 2021-01-15 – 2021-01-23 (×9): 1 via ORAL
  Filled 2021-01-14 (×9): qty 1

## 2021-01-14 MED ORDER — BACITRACIN ZINC 500 UNIT/GM EX OINT
TOPICAL_OINTMENT | CUTANEOUS | Status: AC
  Filled 2021-01-14: qty 28.35

## 2021-01-14 MED ORDER — THROMBIN 5000 UNITS EX SOLR
CUTANEOUS | Status: AC
  Filled 2021-01-14: qty 5000

## 2021-01-14 MED ORDER — EPHEDRINE SULFATE-NACL 50-0.9 MG/10ML-% IV SOSY
PREFILLED_SYRINGE | INTRAVENOUS | Status: DC | PRN
  Administered 2021-01-14 (×4): 5 mg via INTRAVENOUS

## 2021-01-14 MED ORDER — HYDROCODONE-ACETAMINOPHEN 5-325 MG PO TABS
1.0000 | ORAL_TABLET | ORAL | Status: DC | PRN
  Administered 2021-01-16: 1 via ORAL
  Filled 2021-01-14: qty 1

## 2021-01-14 MED ORDER — DEXAMETHASONE SODIUM PHOSPHATE 10 MG/ML IJ SOLN
6.0000 mg | Freq: Four times a day (QID) | INTRAMUSCULAR | Status: AC
  Administered 2021-01-14 – 2021-01-15 (×4): 6 mg via INTRAVENOUS
  Filled 2021-01-14 (×4): qty 1

## 2021-01-14 MED ORDER — LABETALOL HCL 5 MG/ML IV SOLN
10.0000 mg | INTRAVENOUS | Status: DC | PRN
  Administered 2021-01-16: 10 mg via INTRAVENOUS
  Administered 2021-01-16 – 2021-01-17 (×3): 20 mg via INTRAVENOUS
  Filled 2021-01-14 (×4): qty 4

## 2021-01-14 MED ORDER — ROCURONIUM BROMIDE 10 MG/ML (PF) SYRINGE
PREFILLED_SYRINGE | INTRAVENOUS | Status: DC | PRN
  Administered 2021-01-14: 60 mg via INTRAVENOUS
  Administered 2021-01-14: 20 mg via INTRAVENOUS

## 2021-01-14 MED ORDER — LABETALOL HCL 5 MG/ML IV SOLN
10.0000 mg | Freq: Once | INTRAVENOUS | Status: AC
  Administered 2021-01-14: 10 mg via INTRAVENOUS

## 2021-01-14 MED ORDER — GLIPIZIDE 5 MG PO TABS
5.0000 mg | ORAL_TABLET | Freq: Every day | ORAL | Status: DC
  Administered 2021-01-15 – 2021-01-22 (×8): 5 mg via ORAL
  Filled 2021-01-14 (×10): qty 1

## 2021-01-14 MED ORDER — METHIMAZOLE 5 MG PO TABS
2.5000 mg | ORAL_TABLET | Freq: Every morning | ORAL | Status: DC
  Administered 2021-01-15 – 2021-01-23 (×9): 2.5 mg via ORAL
  Filled 2021-01-14 (×10): qty 1

## 2021-01-14 MED ORDER — ROCURONIUM BROMIDE 10 MG/ML (PF) SYRINGE
PREFILLED_SYRINGE | INTRAVENOUS | Status: AC
  Filled 2021-01-14: qty 10

## 2021-01-14 MED ORDER — CLEVIDIPINE BUTYRATE 0.5 MG/ML IV EMUL
INTRAVENOUS | Status: DC | PRN
  Administered 2021-01-14: 1 mg/h via INTRAVENOUS

## 2021-01-14 MED ORDER — PROPOFOL 10 MG/ML IV BOLUS
INTRAVENOUS | Status: DC | PRN
Start: 2021-01-14 — End: 2021-01-14
  Administered 2021-01-14: 60 mg via INTRAVENOUS
  Administered 2021-01-14: 40 mg via INTRAVENOUS
  Administered 2021-01-14: 20 mg via INTRAVENOUS
  Administered 2021-01-14: 100 mg via INTRAVENOUS

## 2021-01-14 MED ORDER — EPHEDRINE 5 MG/ML INJ
INTRAVENOUS | Status: AC
  Filled 2021-01-14: qty 5

## 2021-01-14 MED ORDER — FENTANYL CITRATE (PF) 100 MCG/2ML IJ SOLN: 25.0000 ug | INTRAMUSCULAR | Status: DC | PRN

## 2021-01-14 MED ORDER — ONDANSETRON HCL 4 MG PO TABS: 4.0000 mg | ORAL_TABLET | ORAL | Status: DC | PRN

## 2021-01-14 MED ORDER — SODIUM CHLORIDE 0.9 % IV SOLN
INTRAVENOUS | Status: DC | PRN
  Administered 2021-01-14: 1000 mg via INTRAVENOUS

## 2021-01-14 MED ORDER — HEMOSTATIC AGENTS (NO CHARGE) OPTIME
TOPICAL | Status: DC | PRN
  Administered 2021-01-14 (×2): 1 via TOPICAL

## 2021-01-14 MED ORDER — THROMBIN 5000 UNITS EX SOLR
CUTANEOUS | Status: AC
  Filled 2021-01-14: qty 10000

## 2021-01-14 MED ORDER — ONDANSETRON HCL 4 MG/2ML IJ SOLN
INTRAMUSCULAR | Status: DC | PRN
  Administered 2021-01-14: 4 mg via INTRAVENOUS

## 2021-01-14 SURGICAL SUPPLY — 69 items
BAG COUNTER SPONGE SURGICOUNT (BAG) ×2 IMPLANT
BAG DECANTER FOR FLEXI CONT (MISCELLANEOUS) ×3 IMPLANT
BAG SPNG CNTER NS LX DISP (BAG) ×1
BAG SURGICOUNT SPONGE COUNTING (BAG) ×1
BAND INSRT 18 STRL LF DISP RB (MISCELLANEOUS) ×2
BAND RUBBER #18 3X1/16 STRL (MISCELLANEOUS) ×6 IMPLANT
BLADE CLIPPER SURG (BLADE) ×3 IMPLANT
BNDG COHESIVE 4X5 TAN STRL (GAUZE/BANDAGES/DRESSINGS) IMPLANT
BUR ACORN 6.0 PRECISION (BURR) ×2 IMPLANT
BUR ACORN 6.0MM PRECISION (BURR) ×1
BUR SPIRAL ROUTER 2.3 (BUR) ×2 IMPLANT
BUR SPIRAL ROUTER 2.3MM (BUR) ×1
CANISTER SUCT 3000ML PPV (MISCELLANEOUS) ×6 IMPLANT
CARTRIDGE OIL MAESTRO DRILL (MISCELLANEOUS) ×1 IMPLANT
CLIP VESOCCLUDE MED 6/CT (CLIP) ×3 IMPLANT
CNTNR URN SCR LID CUP LEK RST (MISCELLANEOUS) ×1 IMPLANT
CONT SPEC 4OZ STRL OR WHT (MISCELLANEOUS) ×3
COVER BURR HOLE UNIV 10 (Orthopedic Implant) ×12 IMPLANT
DIFFUSER DRILL AIR PNEUMATIC (MISCELLANEOUS) ×3 IMPLANT
DRAPE CAMERA VIDEO/LASER (DRAPES) IMPLANT
DRAPE MICROSCOPE LEICA (MISCELLANEOUS) ×3 IMPLANT
DRAPE NEUROLOGICAL W/INCISE (DRAPES) ×3 IMPLANT
DRAPE STERI IOBAN 125X83 (DRAPES) IMPLANT
DRAPE SURG 17X23 STRL (DRAPES) IMPLANT
DRAPE WARM FLUID 44X44 (DRAPES) ×3 IMPLANT
ELECT CAUTERY BLADE 6.4 (BLADE) ×3 IMPLANT
ELECT REM PT RETURN 9FT ADLT (ELECTROSURGICAL) ×3
ELECTRODE REM PT RTRN 9FT ADLT (ELECTROSURGICAL) ×1 IMPLANT
FORCEPS BIPOLAR SPETZLER 8 1.0 (NEUROSURGERY SUPPLIES) ×3 IMPLANT
GAUZE 4X4 16PLY ~~LOC~~+RFID DBL (SPONGE) ×6 IMPLANT
GAUZE SPONGE 4X4 12PLY STRL (GAUZE/BANDAGES/DRESSINGS) ×3 IMPLANT
GLOVE EXAM NITRILE XL STR (GLOVE) IMPLANT
GLOVE SURG LTX SZ9 (GLOVE) ×3 IMPLANT
GLOVE SURG UNDER POLY LF SZ6.5 (GLOVE) ×6 IMPLANT
GLOVE SURG UNDER POLY LF SZ7 (GLOVE) ×9 IMPLANT
GLOVE SURG UNDER POLY LF SZ7.5 (GLOVE) ×12 IMPLANT
GOWN STRL REUS W/ TWL LRG LVL3 (GOWN DISPOSABLE) ×1 IMPLANT
GOWN STRL REUS W/ TWL XL LVL3 (GOWN DISPOSABLE) ×2 IMPLANT
GOWN STRL REUS W/TWL 2XL LVL3 (GOWN DISPOSABLE) IMPLANT
GOWN STRL REUS W/TWL LRG LVL3 (GOWN DISPOSABLE) ×3
GOWN STRL REUS W/TWL XL LVL3 (GOWN DISPOSABLE) ×6
HEMOSTAT POWDER KIT SURGIFOAM (HEMOSTASIS) ×12 IMPLANT
HEMOSTAT SURGICEL 2X14 (HEMOSTASIS) ×3 IMPLANT
KIT BASIN OR (CUSTOM PROCEDURE TRAY) ×3 IMPLANT
KIT TURNOVER KIT B (KITS) ×3 IMPLANT
NEEDLE HYPO 18GX1.5 BLUNT FILL (NEEDLE) IMPLANT
NEEDLE HYPO 25X1 1.5 SAFETY (NEEDLE) ×3 IMPLANT
NS IRRIG 1000ML POUR BTL (IV SOLUTION) ×9 IMPLANT
OIL CARTRIDGE MAESTRO DRILL (MISCELLANEOUS) ×3
PACK CRANIOTOMY CUSTOM (CUSTOM PROCEDURE TRAY) ×3 IMPLANT
PAD ARMBOARD 7.5X6 YLW CONV (MISCELLANEOUS) ×9 IMPLANT
PATTIES SURGICAL .25X.25 (GAUZE/BANDAGES/DRESSINGS) IMPLANT
PATTIES SURGICAL .5 X.5 (GAUZE/BANDAGES/DRESSINGS) IMPLANT
PATTIES SURGICAL .5 X3 (DISPOSABLE) IMPLANT
PATTIES SURGICAL 1X1 (DISPOSABLE) IMPLANT
SCREW UNIII AXS SD 1.5X4 (Screw) ×51 IMPLANT
SPONGE NEURO XRAY DETECT 1X3 (DISPOSABLE) ×3 IMPLANT
SPONGE SURGIFOAM ABS GEL 100 (HEMOSTASIS) ×9 IMPLANT
SPONGE T-LAP 4X18 ~~LOC~~+RFID (SPONGE) ×3 IMPLANT
STAPLER VISISTAT 35W (STAPLE) ×3 IMPLANT
STOCKINETTE TUBULAR 6 INCH (GAUZE/BANDAGES/DRESSINGS) ×3 IMPLANT
SUT NURALON 4 0 TR CR/8 (SUTURE) ×6 IMPLANT
SUT VIC AB 2-0 CT2 18 VCP726D (SUTURE) ×3 IMPLANT
SYR CONTROL 10ML LL (SYRINGE) ×3 IMPLANT
TOWEL GREEN STERILE (TOWEL DISPOSABLE) ×3 IMPLANT
TOWEL GREEN STERILE FF (TOWEL DISPOSABLE) ×3 IMPLANT
TRAY FOLEY MTR SLVR 16FR STAT (SET/KITS/TRAYS/PACK) ×3 IMPLANT
UNDERPAD 30X36 HEAVY ABSORB (UNDERPADS AND DIAPERS) IMPLANT
WATER STERILE IRR 1000ML POUR (IV SOLUTION) ×3 IMPLANT

## 2021-01-14 NOTE — Transfer of Care (Signed)
Immediate Anesthesia Transfer of Care Note  Patient: Ann Chaney  Procedure(s) Performed: Craniotomy - left - Frontal - Tumor (Left: Head)  Patient Location: PACU  Anesthesia Type:General  Level of Consciousness: awake and alert   Airway & Oxygen Therapy: Patient Spontanous Breathing and Patient connected to face mask oxygen  Post-op Assessment: Report given to RN and Post -op Vital signs reviewed and stable  Post vital signs: Reviewed and stable  Last Vitals:  Vitals Value Taken Time  BP 160/75 01/14/21 1117  Temp    Pulse 77 01/14/21 1118  Resp 16 01/14/21 1118  SpO2 100 % 01/14/21 1118  Vitals shown include unvalidated device data.  Last Pain:  Vitals:   01/14/21 0639  TempSrc:   PainSc: 0-No pain         Complications: No notable events documented.

## 2021-01-14 NOTE — Anesthesia Procedure Notes (Signed)
Procedure Name: Intubation Date/Time: 01/14/2021 8:24 AM Performed by: Guadlupe Spanish, RN Pre-anesthesia Checklist: Patient identified, Emergency Drugs available, Suction available and Patient being monitored Patient Re-evaluated:Patient Re-evaluated prior to induction Oxygen Delivery Method: Circle system utilized Preoxygenation: Pre-oxygenation with 100% oxygen Induction Type: IV induction Ventilation: Mask ventilation without difficulty Laryngoscope Size: Mac and 3 Grade View: Grade I Tube type: Oral Tube size: 7.0 mm Number of attempts: 1 Airway Equipment and Method: Stylet and Oral airway Placement Confirmation: ETT inserted through vocal cords under direct vision, positive ETCO2 and breath sounds checked- equal and bilateral Secured at: 23 cm Tube secured with: Tape Dental Injury: Teeth and Oropharynx as per pre-operative assessment

## 2021-01-14 NOTE — Progress Notes (Signed)
Postop check.  Patient is awake and aware.  She is only intermittently verbal.  She will move all 4 extremities but she is a little bit less brisk with the right side.  Her wound is clean and dry.  Follow-up head CT scan demonstrates no evidence of hemorrhage or obvious stroke.  Progressing reasonably well following bifrontal craniotomy and resection of parasagittal meningioma.  Continue supportive efforts.  Follow-up MRI scan scheduled for tonight.

## 2021-01-14 NOTE — Op Note (Signed)
Date of procedure: 01/14/2021  Date of dictation: Same  Service: Neurosurgery  Preoperative diagnosis: Left parasagittal extra-axial tumor, probable meningioma  Postoperative diagnosis: Same  Procedure Name: Bifrontal craniotomy with resection of left parasagittal extra-axial tumor, microdissection  Surgeon:Keldric Poyer A.Javohn Basey, M.D.  Asst. Surgeon: Reinaldo Meeker, NP  Anesthesia: General  Indication: 75 year old female with progressive memory and cognitive issues.  Work-up included an MRI scan for screening for possible Alzheimer's type dementia.  This is a screening exam demonstrated a large left frontal tumor attached to the falx cerebri with marked associated mass-effect and some surrounding edema.  Patient presents now for resection.  Operative note: After induction of anesthesia, patient positioned supine with head fixed in [place] pin headrest.  Patient's scalp was prepped and draped sterilely.  A modified bicoronal incision was made biased toward the left.  Scalp flap was reflected anteriorly and held in place.  Bifrontal craniotomy was then performed using high-speed drill and Kerrison rongeurs.  Bone flap was elevated.  Minimal bleeding along the superior sagittal sinus which was controlled with Gelfoam.  Dura was incised and a dural flap was then made hinged along the superior sinus.  The underlying frontal lobe and the interface with the tumor was identified.  Dissection was then made around the tumor.  The tumor was entered.  The tumor was friable and fairly easily suckable.  It was debulked internally with suction and gentle curetting.  Microscope was used for microdissection of the tumor and the tumor brain interface.  The tumor capsule was then mobilized from the frontal and lateral regions and then worked posteriorly.  All feeding blood vessels were coagulated using the bipolar cautery.  The capsule was mobilized and removed.  All attachment points to the falx cerebri were dissected free and  coagulated.  There was no gross invasion of the superior sagittal sinus.  Gross total resection of the tumor was achieved.  Tumor was sent to pathology for evaluation.  Hemostasis was then achieved using bipolar cautery and Surgifoam.  Once adequate hemostasis was achieved the resection bed was lined with Surgicel.  Dura was reapproximated using 4-0 Nurolon.  The skull flap was reapproximated using OsteoMed plates.  Scalp was reapproximated with Vicryl suture at the galea and staples at the surface.  The patient tolerated procedure well and she returns to the recovery room postop.

## 2021-01-14 NOTE — Anesthesia Postprocedure Evaluation (Signed)
Anesthesia Post Note  Patient: Ann Chaney  Procedure(s) Performed: Craniotomy - left - Frontal - Tumor (Left: Head)     Patient location during evaluation: PACU Anesthesia Type: General Level of consciousness: awake and alert Pain management: pain level controlled Vital Signs Assessment: post-procedure vital signs reviewed and stable Respiratory status: spontaneous breathing, nonlabored ventilation, respiratory function stable and patient connected to nasal cannula oxygen Cardiovascular status: blood pressure returned to baseline and stable Postop Assessment: no apparent nausea or vomiting Anesthetic complications: no   No notable events documented.  Last Vitals:  Vitals:   01/14/21 1330 01/14/21 1345  BP: (!) 152/63 (!) 154/62  Pulse: 60 61  Resp: 12 11  Temp:  36.7 C  SpO2: 99% 99%    Last Pain:  Vitals:   01/14/21 0639  TempSrc:   PainSc: 0-No pain    LLE Motor Response: Purposeful movement;Responds to commands (01/14/21 1345)   RLE Motor Response: Purposeful movement;Responds to commands (01/14/21 1345)        Catalina Gravel

## 2021-01-14 NOTE — Anesthesia Procedure Notes (Signed)
Arterial Line Insertion Start/End10/24/2022 7:21 AM, 01/14/2021 7:21 AM Performed by: Catalina Gravel, MD, Genelle Bal, CRNA, CRNA  Patient location: Pre-op. Preanesthetic checklist: patient identified, IV checked, site marked, risks and benefits discussed, surgical consent, monitors and equipment checked, pre-op evaluation, timeout performed and anesthesia consent Lidocaine 1% used for infiltration Left, radial was placed Catheter size: 20 G Hand hygiene performed  and maximum sterile barriers used   Attempts: 1 Procedure performed without using ultrasound guided technique. Following insertion, dressing applied. Post procedure assessment: normal and unchanged  Patient tolerated the procedure well with no immediate complications.

## 2021-01-14 NOTE — Brief Op Note (Signed)
01/14/2021  10:49 AM  PATIENT:  Ann Chaney  75 y.o. female  PRE-OPERATIVE DIAGNOSIS:  Meningioma  POST-OPERATIVE DIAGNOSIS:  Meningioma  PROCEDURE:  Procedure(s): Craniotomy - left - Frontal - Tumor (Left)  SURGEON:  Surgeon(s) and Role:    Earnie Larsson, MD - Primary  PHYSICIAN ASSISTANT:   ASSISTANTSMearl Latin   ANESTHESIA:   general  EBL:  200cc   BLOOD ADMINISTERED:none  DRAINS: none   LOCAL MEDICATIONS USED:  LIDOCAINE   SPECIMEN:  Source of Specimen:  Left frontal parasagittal tumor  DISPOSITION OF SPECIMEN:  PATHOLOGY  COUNTS:  YES  TOURNIQUET:  * No tourniquets in log *  DICTATION: .Dragon Dictation  PLAN OF CARE: Admit to inpatient   PATIENT DISPOSITION:  PACU - hemodynamically stable.   Delay start of Pharmacological VTE agent (>24hrs) due to surgical blood loss or risk of bleeding: yes

## 2021-01-14 NOTE — H&P (Signed)
Ann Chaney is an 75 y.o. female.   Chief Complaint: Memory issues HPI: 75 year old female with progressive memory and cognitive issues.  Patient was being screened for possible Alzheimer's disease and MRI scan was performed.  This demonstrates a large left frontal parasagittal extra-axial tumor consistent with meningioma with significant mass-effect.  Patient without evidence of history of metastatic disease or other ongoing major medical problems.  She presents now for craniotomy and resection of tumor.  Past Medical History:  Diagnosis Date   Diabetes mellitus without complication ([place])    Glaucoma    Glaucoma    Hyperlipidemia    Hypertension     Past Surgical History:  Procedure Laterality Date   ABDOMINAL HYSTERECTOMY     HYSTEROTOMY      Family History  Problem Relation Age of Onset   Cancer Mother        sternum   Emphysema Mother    Diabetes Mother    Glaucoma Father    Dementia Father    Breast cancer Maternal Aunt    Social History:  reports that she quit smoking about 5 months ago. Her smoking use included cigarettes. She smoked an average of .5 packs per day. She has never used smokeless tobacco. She reports that she does not drink alcohol and does not use drugs.  Allergies:  Allergies  Allergen Reactions   Atorvastatin Other (See Comments)    Myalgias   Pravastatin Sodium Nausea Only    Medications Prior to Admission  Medication Sig Dispense Refill   acetaminophen (TYLENOL) 500 MG tablet Take 500 mg by mouth every 6 (six) hours as needed (pain).     alendronate (FOSAMAX) 70 MG tablet Take 1 tablet (70 mg total) by mouth every 7 (seven) days. Take with a full glass of water on an empty stomach. (Patient taking differently: Take 70 mg by mouth every Friday. Take with a full glass of water on an empty stomach.) 4 tablet 5   glipiZIDE (GLUCOTROL) 5 MG tablet Take 1 tablet (5 mg total) by mouth 2 (two) times daily before a meal. (Patient taking differently:  Take 5 mg by mouth in the morning.) 180 tablet 1   latanoprost (XALATAN) 0.005 % ophthalmic solution Place 1 drop into both eyes at bedtime.     lisinopril (ZESTRIL) 40 MG tablet Take 1 tablet (40 mg total) by mouth daily. 90 tablet 1   metFORMIN (GLUCOPHAGE) 1000 MG tablet Take 1 tablet (1,000 mg total) by mouth 2 (two) times daily. 180 tablet 1   methimazole (TAPAZOLE) 5 MG tablet Take 2.5 mg by mouth in the morning.     metoprolol succinate (TOPROL-XL) 50 MG 24 hr tablet Take 1 tablet (50 mg total) by mouth daily. Take with or immediately following a meal. 180 tablet 1   Multiple Vitamin (MULTIVITAMIN WITH MINERALS) TABS tablet Take 1 tablet by mouth daily.     pioglitazone (ACTOS) 15 MG tablet Take 1 tablet (15 mg total) by mouth daily. 90 tablet 1   ACCU-CHEK AVIVA PLUS test strip 1 each daily.     Accu-Chek Softclix Lancets lancets      dexamethasone (DECADRON) 2 MG tablet Take 1 tablet (2 mg total) by mouth 2 (two) times daily. 60 tablet 0   ezetimibe (ZETIA) 10 MG tablet Take 1 tablet (10 mg total) by mouth daily. (Patient not taking: Reported on 01/09/2021) 90 tablet 1    Results for orders placed or performed during the hospital encounter of 01/14/21 (from the  past 48 hour(s))  Glucose, capillary     Status: Abnormal   Collection Time: 01/14/21  6:27 AM  Result Value Ref Range   Glucose-Capillary 101 (H) 70 - 99 mg/dL    Comment: Glucose reference range applies only to samples taken after fasting for at least 8 hours.  ABO/Rh     Status: None (Preliminary result)   Collection Time: 01/14/21  6:40 AM  Result Value Ref Range   ABO/RH(D) PENDING    No results found.  Pertinent items noted in HPI and remainder of comprehensive ROS otherwise negative.  Blood pressure (!) 166/55, pulse 61, temperature 98.6 F (37 C), temperature source Oral, resp. rate 18, height 5\' 5"  (1.651 m), weight 54.9 kg, SpO2 99 %.  Patient is awake and alert.  She is oriented and reasonably appropriate.   Speech is fluent.  Judgment insight are intact.  Cranial nerve function normal bilateral.  Motor examination 5/5 bilateral sensory examination nonfocal.  Deep intermix is normal active.  No evidence of long track signs signs.  Examination head ears eyes nose and throat is unremarkable chest and abdomen are benign.  Extremities are free of major deformity. Assessment/Plan Left frontal parasagittal extra-axial tumor, almost certainly a meningioma.  Plan bifrontal craniotomy and resection of tumor.  Risks and benefits been explained.  Patient wishes to proceed.  Mallie Mussel A Raeshawn Vo 01/14/2021, 7:55 AM

## 2021-01-14 NOTE — Progress Notes (Signed)
Patient has been in PACU for over an hour and is still not waking up and following commands appropriately.  She opens her eyes to voice, but is unable to verbalize anything or answer any questions.  Occasional movement noted to extremities, but nothing on command and is moving left side moreso than right.  Dr. Annette Stable was notified and ordered STAT head CT. CT unavailable at this time, but will notify when patient can come.  Dr. Gifford Shave with anesthesia was also updated.

## 2021-01-15 ENCOUNTER — Inpatient Hospital Stay (HOSPITAL_COMMUNITY): Payer: Medicare Other

## 2021-01-15 ENCOUNTER — Encounter (HOSPITAL_COMMUNITY): Payer: Self-pay | Admitting: Neurosurgery

## 2021-01-15 DIAGNOSIS — R569 Unspecified convulsions: Secondary | ICD-10-CM

## 2021-01-15 DIAGNOSIS — D329 Benign neoplasm of meninges, unspecified: Secondary | ICD-10-CM

## 2021-01-15 DIAGNOSIS — R4182 Altered mental status, unspecified: Secondary | ICD-10-CM

## 2021-01-15 LAB — GLUCOSE, CAPILLARY
Glucose-Capillary: 211 mg/dL — ABNORMAL HIGH (ref 70–99)
Glucose-Capillary: 169 mg/dL — ABNORMAL HIGH (ref 70–99)
Glucose-Capillary: 184 mg/dL — ABNORMAL HIGH (ref 70–99)
Glucose-Capillary: 155 mg/dL — ABNORMAL HIGH (ref 70–99)
Glucose-Capillary: 177 mg/dL — ABNORMAL HIGH (ref 70–99)

## 2021-01-15 LAB — CBC
HCT: 37.3 % (ref 36.0–46.0)
Hemoglobin: 11.8 g/dL — ABNORMAL LOW (ref 12.0–15.0)
MCH: 24 pg — ABNORMAL LOW (ref 26.0–34.0)
MCHC: 31.6 g/dL (ref 30.0–36.0)
MCV: 75.8 fL — ABNORMAL LOW (ref 80.0–100.0)
Platelets: 263 10*3/uL (ref 150–400)
RBC: 4.92 MIL/uL (ref 3.87–5.11)
RDW: 17.2 % — ABNORMAL HIGH (ref 11.5–15.5)
WBC: 18.7 10*3/uL — ABNORMAL HIGH (ref 4.0–10.5)
nRBC: 0 % (ref 0.0–0.2)

## 2021-01-15 LAB — SURGICAL PATHOLOGY

## 2021-01-15 LAB — HEMOGLOBIN A1C
Hgb A1c MFr Bld: 6.2 % — ABNORMAL HIGH (ref 4.8–5.6)
Mean Plasma Glucose: 131.24 mg/dL

## 2021-01-15 LAB — BASIC METABOLIC PANEL
Anion gap: 10 (ref 5–15)
BUN: 12 mg/dL (ref 8–23)
CO2: 22 mmol/L (ref 22–32)
Calcium: 8.8 mg/dL — ABNORMAL LOW (ref 8.9–10.3)
Chloride: 100 mmol/L (ref 98–111)
Creatinine, Ser: 0.79 mg/dL (ref 0.44–1.00)
GFR, Estimated: >60 mL/min (ref >60)
Glucose, Bld: 208 mg/dL — ABNORMAL HIGH (ref 70–99)
Potassium: 4.3 mmol/L (ref 3.5–5.1)
Sodium: 132 mmol/L — ABNORMAL LOW (ref 135–145)

## 2021-01-15 MED ORDER — INSULIN ASPART 100 UNIT/ML IJ SOLN
0.0000 [IU] | Freq: Three times a day (TID) | INTRAMUSCULAR | Status: DC
  Administered 2021-01-15 – 2021-01-16 (×4): 2 [IU] via SUBCUTANEOUS
  Administered 2021-01-17 – 2021-01-18 (×4): 1 [IU] via SUBCUTANEOUS
  Administered 2021-01-18: 2 [IU] via SUBCUTANEOUS
  Administered 2021-01-19: 1 [IU] via SUBCUTANEOUS
  Administered 2021-01-20: 2 [IU] via SUBCUTANEOUS
  Administered 2021-01-20 – 2021-01-23 (×4): 1 [IU] via SUBCUTANEOUS

## 2021-01-15 MED ORDER — INFLUENZA VAC A&B SA ADJ QUAD 0.5 ML IM PRSY
0.5000 mL | PREFILLED_SYRINGE | INTRAMUSCULAR | Status: DC
  Filled 2021-01-15: qty 0.5

## 2021-01-15 MED ORDER — GADOBUTROL 1 MMOL/ML IV SOLN
4.5000 mL | Freq: Once | INTRAVENOUS | Status: AC | PRN
  Administered 2021-01-15: 4.5 mL via INTRAVENOUS

## 2021-01-15 MED ORDER — PANTOPRAZOLE SODIUM 40 MG PO TBEC
40.0000 mg | DELAYED_RELEASE_TABLET | Freq: Every day | ORAL | Status: DC
  Administered 2021-01-15 – 2021-01-22 (×8): 40 mg via ORAL
  Filled 2021-01-15 (×8): qty 1

## 2021-01-15 MED FILL — Thrombin For Soln 20000 Unit: CUTANEOUS | Qty: 1 | Status: AC

## 2021-01-15 MED FILL — Thrombin For Soln 5000 Unit: CUTANEOUS | Qty: 5000 | Status: AC

## 2021-01-15 NOTE — Progress Notes (Signed)
LTM EEG hooked up and running - no initial skin breakdown - push button tested - neuro notified. [facility] monitoring.  

## 2021-01-15 NOTE — Progress Notes (Signed)
STAT EEG complete - results pending. ? ?

## 2021-01-15 NOTE — Procedures (Addendum)
Patient Name: MEEYAH OVITT  MRN: [id number]  Epilepsy Attending: Lora Havens  Referring Physician/Provider: Rowan Blase, NP Date: 01/15/2021 Duration: 21.41 mins  Patient history: 75 year old female with bifrontal craniotomy and resection of parasagittal meningioma, continued to be altered.  EEG to evaluate for seizures.  Level of alertness:  lethargic   AEDs during EEG study: LEV  Technical aspects: This EEG study was done with scalp electrodes positioned according to the 10-20 International system of electrode placement. Electrical activity was acquired at a sampling rate of 500Hz  and reviewed with a high frequency filter of 70Hz  and a low frequency filter of 1Hz . EEG data were recorded continuously and digitally stored.   Description: No clear posterior dominant rhythm was seen.  EEG showed continuous generalized polymorphic sharply contoured mixed frequencies with predominantly 3 to 6 Hz theta-delta slowing admixed with 8 to 10 Hz intermittent generalized alpha activity.  Sharp transients were noted in left posterior quadrant.  Hyperventilation and photic stimulation were not performed.     ABNORMALITY - Continuous slow, generalized  IMPRESSION: This study is suggestive of moderate diffuse encephalopathy, nonspecific to etiology.  No seizures or definite epileptiform discharges were seen throughout the recording.  Jeniffer Culliver Barbra Sarks

## 2021-01-15 NOTE — Progress Notes (Signed)
Inpatient Diabetes Program Recommendations  AACE/ADA: New Consensus Statement on Inpatient Glycemic Control (2015)  Target Ranges:  Prepandial:   less than 140 mg/dL      Peak postprandial:   less than 180 mg/dL (1-2 hours)      Critically ill patients:  140 - 180 mg/dL   Lab Results  Component Value Date   GLUCAP 184 (H) 01/15/2021   HGBA1C 6.4 (H) 07/26/2020    Review of Glycemic Control Results for LARIA, GRIMMETT (MRN [id number]) as of 01/15/2021 12:18  Ref. Range 01/14/2021 20:12 01/15/2021 03:37 01/15/2021 08:10 01/15/2021 11:59  Glucose-Capillary Latest Ref Range: 70 - 99 mg/dL 239 (H) 211 (H) 169 (H) 184 (H)   Diabetes history: Type 2 DM Outpatient Diabetes medications: Glipizide 5 mg QD, Metformin 1000 mg BID, Actos 15 mg QD Current orders for Inpatient glycemic control: Glipizide 5 mg QD, Metformin 1000 mg BID, Actos 15 mg QD  Inpatient Diabetes Program Recommendations:    Consider adding Novolog 0-9 units TID & HS.   Thanks, Bronson Curb, MSN, RNC-OB Diabetes Coordinator [phone] (8a-5p)

## 2021-01-15 NOTE — Progress Notes (Signed)
Postop day 1.  Patient continues to be blunted with minimal efforts at verbalization.  Still with right-sided weakness.  No demonstrated seizure activity.  Patient does not appear to be in any pain or distress.  She is afebrile.  Vital signs are stable.  Urine output is good.  Follow-up MRI scan demonstrates complete resection of the patient's parasagittal meningioma with postoperative expected swelling.  No areas of large infarct or hemorrhage.  Nothing to explain the patient's right-sided weakness or aphasia.  Patient with left hemispheric deficits following craniotomy and resection of large left frontal meningioma.  Will check EEG today to evaluate for possible simple partial status.Continue with supportive care.

## 2021-01-16 LAB — GLUCOSE, CAPILLARY
Glucose-Capillary: 167 mg/dL — ABNORMAL HIGH (ref 70–99)
Glucose-Capillary: 161 mg/dL — ABNORMAL HIGH (ref 70–99)
Glucose-Capillary: 187 mg/dL — ABNORMAL HIGH (ref 70–99)
Glucose-Capillary: 156 mg/dL — ABNORMAL HIGH (ref 70–99)

## 2021-01-16 LAB — TRIGLYCERIDES: Triglycerides: 65 mg/dL (ref <150)

## 2021-01-16 NOTE — Progress Notes (Signed)
LTM discontinued. [facility] notified. No skin break down

## 2021-01-16 NOTE — Procedures (Addendum)
Patient Name: Ann Chaney  MRN: [id number]  Epilepsy Attending: Lora Havens  Referring Physician/Provider: Rowan Blase, NP Duration: 01/15/2021 1139 to 01/16/2021 1029   Patient history: 75 year old female with bifrontal craniotomy and resection of parasagittal meningioma, continued to be altered.  EEG to evaluate for seizures.   Level of alertness: lethargic   AEDs during EEG study: LEV   Technical aspects: This EEG study was done with scalp electrodes positioned according to the 10-20 International system of electrode placement. Electrical activity was acquired at a sampling rate of 500Hz  and reviewed with a high frequency filter of 70Hz  and a low frequency filter of 1Hz . EEG data were recorded continuously and digitally stored.    Description: No clear posterior dominant rhythm was seen. EEG showed continuous generalized polymorphic sharply contoured mixed frequencies with predominantly 3 to 6 Hz theta-delta slowing admixed with 8 to 10 Hz intermittent generalized alpha activity.  Sharp transients were noted in left posterior quadrant, predominantly when awake/stimulated. Hyperventilation and photic stimulation were not performed.      ABNORMALITY - Continuous slow, generalized   IMPRESSION: This study is suggestive of moderate diffuse encephalopathy, nonspecific to etiology.  No seizures or definite epileptiform discharges were seen throughout the recording.   Peng Thorstenson Barbra Sarks

## 2021-01-16 NOTE — Progress Notes (Signed)
Postop day 2.  No events or problems overnight.  No witnessed seizure activity nor is there any seizure activity on EEG monitoring.  Patient brighter today.  More interactive.  Will participate in conversation.  Continues to have significant right-sided weakness.  Left-sided strength is good.  She is swallowing well and eating reasonably well.  Her wound is clean and dry.  Progressing slowly following craniotomy and resection of large left-sided parasagittal meningioma.  Patient with some SMA dysfunction which will hopefully improve with time.  At this point I think would be fine to stop her EEG monitoring.  She needs to mobilize with therapy.  She will probably require CIR for further improvement.

## 2021-01-17 LAB — GLUCOSE, CAPILLARY
Glucose-Capillary: 124 mg/dL — ABNORMAL HIGH (ref 70–99)
Glucose-Capillary: 136 mg/dL — ABNORMAL HIGH (ref 70–99)
Glucose-Capillary: 95 mg/dL (ref 70–99)
Glucose-Capillary: 129 mg/dL — ABNORMAL HIGH (ref 70–99)

## 2021-01-17 NOTE — Evaluation (Signed)
Occupational Therapy Evaluation Patient Details Name: Ann Chaney MRN: [id number] DOB: November 16, 1945 Today's Date: 01/17/2021   History of Present Illness 75 y/o female s/p bifrontal craniotomy for L frontal parasagittal extra-axial meningioma resection on 10/24. Following procedure, patient with decreased arousal and R sided weakness. Post op MRI revealed mass resection without evidence of residual and small scattered infarcts in bilateral cerebrum. PMH: DM type 2, glaucoma, HLD, HTN.   Clinical Impression   PT admitted with craniotomy with MRI + scattered infarcts bil cerebrum post operative. Pt currently with functional limitiations due to the deficits listed below (see OT problem list). Pt current with receptive and expressive language deficits noted and requesting SLP consult. Pt with delayed responses to most questions. Pt with no response at all at times even with multiple attempts, delays to allow pt time to answer and readdressing at different time during session. Home setup is not complete due to patient inability to provide further information at this time. Pt laughing in response to therapist providing a pause for pt to response to questions. Pt reading lips and using visual cues to initiate task. Pt demonstrates R LE weakness but with visual cue to kick able to hold against resistance. Pt's R UE demonstrates weakness > R LE during functional task. Pt will need (A) for all meals Pt will benefit from skilled OT to increase their independence and safety with adls and balance to allow discharge CIR.       Recommendations for follow up therapy are one component of a multi-disciplinary discharge planning process, led by the attending physician.  Recommendations may be updated based on patient status, additional functional criteria and insurance authorization.   Follow Up Recommendations  Acute inpatient rehab (3hours/day)    Assistance Recommended at Discharge Frequent or constant  Supervision/Assistance  Functional Status Assessment  Patient has had a recent decline in their functional status and demonstrates the ability to make significant improvements in function in a reasonable and predictable amount of time.  Equipment Recommendations  [facility]    Recommendations for Other Services Rehab consult     Precautions / Restrictions Precautions Precautions: Fall Restrictions Weight Bearing Restrictions: No      Mobility Bed Mobility Overal bed mobility: Needs Assistance Bed Mobility: Rolling;Supine to Sit Rolling: Min assist   Supine to sit: Mod assist     General bed mobility comments: needs max cues visually to help pt understand where to progress. once patient initiates and engaged (A) was decreased. pt needs (A) for R LE compared to LLE    Transfers Overall transfer level: Needs assistance Equipment used: 2 person hand held assist Transfers: Sit to/from [facility] Sit to Stand: +2 physical assistance;Mod assist Stand pivot transfers: +2 physical assistance;Mod assist         General transfer comment: pt progressed x3 steps toward chair with R LE weakness noted.      Balance Overall balance assessment: Needs assistance Sitting-balance support: Bilateral upper extremity supported;Feet supported Sitting balance-Leahy Scale: Fair     Standing balance support: Bilateral upper extremity supported;During functional activity Standing balance-Leahy Scale: Poor                             ADL either performed or assessed with clinical judgement   ADL Overall ADL's : Needs assistance/impaired Eating/Feeding: Moderate assistance;Sitting Eating/Feeding Details (indicate cue type and reason): provided a rolled wash cloth to increase grasp for R hand. pt unaware when cup  fell out of hand. pt holding straw in her mouth but unaware it is not in the cup. Grooming: Wash/dry hands;Minimal assistance   Upper Body Bathing: Moderate  assistance   Lower Body Bathing: Moderate assistance   Upper Body Dressing : Moderate assistance   Lower Body Dressing: Moderate assistance   Toilet Transfer: +2 for physical assistance;Moderate assistance;Ambulation;BSC Toilet Transfer Details (indicate cue type and reason): simulated OOB to chair and safety           General ADL Comments: pt progressed from bed to chair. pt does well with visual cues to move to chair but does not initiate with verbal cues.     Vision Baseline Vision/History: 3 Glaucoma Additional Comments: attending to R visual fields     Perception     Praxis      Pertinent Vitals/Pain Pain Assessment: No/denies pain     Hand Dominance Right   Extremity/Trunk Assessment Upper Extremity Assessment Upper Extremity Assessment: RUE deficits/detail RUE Deficits / Details: AROM all digits, decreased shoulder flexion ~50 degrees. AAROM full ROM. pt with decrease grasp. pt with edeam throughout the arm. pt with decreased sensation but reports normal RUE Sensation: decreased light touch;decreased proprioception RUE Coordination: decreased fine motor;decreased gross motor   Lower Extremity Assessment Lower Extremity Assessment: Defer to PT evaluation   Cervical / Trunk Assessment Cervical / Trunk Assessment: Kyphotic   Communication Communication Communication: Receptive difficulties;Expressive difficulties   Cognition Arousal/Alertness: Awake/alert Behavior During Therapy: WFL for tasks assessed/performed Overall Cognitive Status: Impaired/Different from baseline Area of Impairment: Orientation;Attention;Memory;Following commands;Safety/judgement;Awareness;Problem solving                 Orientation Level: Disoriented to;Time Current Attention Level: Sustained Memory: Decreased recall of precautions;Decreased short-term memory Following Commands: Follows one step commands inconsistently Safety/Judgement: Decreased awareness of  safety;Decreased awareness of deficits Awareness: Intellectual Problem Solving: Slow processing;Decreased initiation;Difficulty sequencing General Comments: pt responding once with garbled speech and uncertain information expressed. OT asking for pt to complete task with on initation so ( receptive deficits?) Pt responds better with lip reading but even with that with delay responses     General Comments  VSS RA    Exercises Exercises: Other exercises Other Exercises Other Exercises: engagement of R hand in functional task to help with AROM and decreased edema. pt reports self feeding "harder" when asked   Shoulder Instructions      Home Living Family/patient expects to be discharged to:: Private residence Living Arrangements: Alone   Type of Home: House Home Access: Level entry     Home Layout: One [place]: None   Additional Comments: Dog names [place]      Prior Functioning/Environment Prior Level of Function : Independent/Modified Independent             Mobility Comments: no DME ADLs Comments: sister does grocery shopping, sister will be assisting her at dc        OT Problem List: Decreased strength;Decreased range of motion;Decreased activity tolerance;Impaired balance (sitting and/or standing);Decreased coordination;Decreased cognition;Decreased safety awareness;Decreased knowledge of use of DME or AE;Decreased knowledge of precautions;Impaired UE functional use      OT Treatment/Interventions: Self-care/ADL training;Therapeutic exercise;Neuromuscular education;Energy conservation;DME and/or AE instruction;Manual therapy;Modalities;Therapeutic activities;Cognitive remediation/compensation;Visual/perceptual remediation/compensation;Patient/family education;Balance training    OT Goals(Current goals can be found in the care plan section) Acute Rehab OT Goals Patient Stated Goal: none stated OT Goal Formulation: Patient unable to  participate in  goal setting Time For Goal Achievement: 01/31/21 Potential to Achieve Goals: Good  OT Frequency: Min 2X/week   Barriers to D/C: Other (comment) (lives alone but reports sister will help)          Co-evaluation PT/OT/SLP Co-Evaluation/Treatment: Yes Reason for Co-Treatment: For patient/therapist safety;To address functional/ADL transfers;Necessary to address cognition/behavior during functional activity;Complexity of the patient's impairments (multi-system involvement)   OT goals addressed during session: ADL's and self-care;Proper use of Adaptive equipment and DME;Strengthening/ROM      AM-PAC OT "6 Clicks" Daily Activity     Outcome Measure Help from another person eating meals?: A Little Help from another person taking care of personal grooming?: A Little Help from another person toileting, which includes using toliet, bedpan, or urinal?: A Lot Help from another person bathing (including washing, rinsing, drying)?: A Lot Help from another person to put on and taking off regular upper body clothing?: A Lot Help from another person to put on and taking off regular lower body clothing?: A Lot 6 Click Score: 14   End of Session Equipment Utilized During Treatment: Gait belt Nurse Communication: Mobility status;Precautions  Activity Tolerance: Patient tolerated treatment well Patient left: in chair;with call bell/phone within reach;with chair alarm set  OT Visit Diagnosis: Unsteadiness on feet (R26.81);Muscle weakness (generalized) (M62.81)                Time: 9024-0973 OT Time Calculation (min): 26 min Charges:  OT General Charges $OT Visit: 1 Visit OT Evaluation $OT Eval Moderate Complexity: 1 Mod   Brynn, OTR/L  Acute Rehabilitation Services Pager: [phone] Office: [phone] .   Jeri Modena 01/17/2021, 10:44 AM

## 2021-01-17 NOTE — Evaluation (Signed)
Physical Therapy Evaluation Patient Details Name: Ann Chaney MRN: [id number] DOB: [birth year] Today's Date: 01/17/2021  History of Present Illness  75 y/o female s/p bifrontal craniotomy for L frontal parasagittal extra-axial meningioma resection on 10/24. Following procedure, patient with decreased arousal and R sided weakness. Post op MRI revealed mass resection without evidence of residual and small scattered infarcts in bilateral cerebrum. PMH: DM type 2, glaucoma, HLD, HTN.  Clinical Impression  PTA, patient reports living alone and independent, although unsure of accuracy. Patient with expressive and receptive difficulties during session, requesting SLP consult. Patient with variable responses during session and at times not responding at all. Patient at times laughing in response to therapist giving time for patient to answer questions. Patient requires min-modA for bed mobility and modA+2 for sit to stand and stand pivot transfer with HHAx2. Patient demonstrates R sided weakness, impaired coordination, impaired balance, decreased activity tolerance, and impaired cognition. Patient will benefit from skilled PT services during acute stay to address listed deficits. Recommend CIR at discharge to maximize functional independence and assist with return to PLOF.        Recommendations for follow up therapy are one component of a multi-disciplinary discharge planning process, led by the attending physician.  Recommendations may be updated based on patient status, additional functional criteria and insurance authorization.  Follow Up Recommendations Acute inpatient rehab (3hours/day)    Assistance Recommended at Discharge Frequent or constant Supervision/Assistance  Functional Status Assessment Patient has had a recent decline in their functional status and demonstrates the ability to make significant improvements in function in a reasonable and predictable amount of time.  Equipment  Recommendations  Other (comment) (TBD)    Recommendations for Other Services       Precautions / Restrictions Precautions Precautions: Fall Restrictions Weight Bearing Restrictions: No      Mobility  Bed Mobility Overal bed mobility: Needs Assistance Bed Mobility: Rolling;Supine to Sit Rolling: Min assist   Supine to sit: Mod assist     General bed mobility comments: needs max cues visually to help pt understand where to progress. once patient initiates and engaged (A) was decreased. pt needs (A) for R LE compared to LLE    Transfers Overall transfer level: Needs assistance Equipment used: 2 person hand held assist Transfers: Sit to/from [facility] Sit to Stand: +2 physical assistance;Mod assist Stand pivot transfers: +2 physical assistance;Mod assist         General transfer comment: pt progressed x3 steps toward chair with R LE weakness noted.    Ambulation/Gait                Stairs            Wheelchair Mobility    Modified Rankin (Stroke Patients Only) Modified Rankin (Stroke Patients Only) Pre-Morbid Rankin Score: No significant disability Modified Rankin: Moderately severe disability     Balance Overall balance assessment: Needs assistance Sitting-balance support: Bilateral upper extremity supported;Feet supported Sitting balance-Leahy Scale: Fair     Standing balance support: Bilateral upper extremity supported;During functional activity Standing balance-Leahy Scale: Poor                               Pertinent Vitals/Pain Pain Assessment: No/denies pain    Home Living Family/patient expects to be discharged to:: Private residence Living Arrangements: Alone   Type of Home: House Home Access: Level entry       Home Layout: One level  Home Equipment: None Additional Comments: Dog names [place]    Prior Function Prior Level of Function : Independent/Modified Independent              Mobility Comments: no DME ADLs Comments: sister does grocery shopping, sister will be assisting her at [place]   Dominant Hand: Right    Extremity/Trunk Assessment   Upper Extremity Assessment Upper Extremity Assessment: Defer to OT evaluation    Lower Extremity Assessment Lower Extremity Assessment: RLE deficits/detail RLE Deficits / Details: grossly 4-/5, difficult to fully assess due to cognition RLE Sensation: decreased light touch RLE Coordination: decreased gross motor;decreased fine motor    Cervical / Trunk Assessment Cervical / Trunk Assessment: Kyphotic  Communication   Communication: Receptive difficulties;Expressive difficulties  Cognition Arousal/Alertness: Awake/alert Behavior During Therapy: WFL for tasks assessed/performed Overall Cognitive Status: Impaired/Different from baseline Area of Impairment: Orientation;Attention;Memory;Following commands;Safety/judgement;Awareness;Problem solving                 Orientation Level: Disoriented to;Time Current Attention Level: Sustained Memory: Decreased recall of precautions;Decreased short-term memory Following Commands: Follows one step commands inconsistently Safety/Judgement: Decreased awareness of safety;Decreased awareness of deficits Awareness: Intellectual Problem Solving: Slow processing;Decreased initiation;Difficulty sequencing General Comments: pt responding once with garbled speech and uncertain information expressed. OT asking for pt to complete task with on initation so ( receptive deficits?) Pt responds better with lip reading but even with that with delay responses        General Comments General comments (skin integrity, edema, etc.): VSS on RA    Exercises     Assessment/Plan    PT Assessment Patient needs continued PT services  PT Problem List Decreased strength;Decreased activity tolerance;Decreased balance;Decreased coordination;Decreased mobility;Decreased  cognition;Decreased knowledge of use of DME;Decreased safety awareness;Decreased knowledge of precautions;Impaired sensation       PT Treatment Interventions DME instruction;Gait training;Stair training;Functional mobility training;Therapeutic activities;Therapeutic exercise;Balance training;Neuromuscular re-education;Patient/family education    PT Goals (Current goals can be found in the Care Plan section)  Acute Rehab PT Goals Patient Stated Goal: did not state PT Goal Formulation: With patient Time For Goal Achievement: 01/31/21 Potential to Achieve Goals: Good    Frequency Min 4X/week   Barriers to discharge        Co-evaluation PT/OT/SLP Co-Evaluation/Treatment: Yes Reason for Co-Treatment: For patient/therapist safety;To address functional/ADL transfers;Complexity of the patient's impairments (multi-system involvement);Necessary to address cognition/behavior during functional activity PT goals addressed during session: Mobility/safety with mobility;Balance         AM-PAC PT "6 Clicks" Mobility  Outcome Measure Help needed turning from your back to your side while in a flat bed without using bedrails?: A Little Help needed moving from lying on your back to sitting on the side of a flat bed without using bedrails?: A Little Help needed moving to and from a bed to a chair (including a wheelchair)?: Total Help needed standing up from a chair using your arms (e.g., wheelchair or bedside chair)?: Total Help needed to walk in hospital room?: Total Help needed climbing 3-5 steps with a railing? : Total 6 Click Score: 10    End of Session Equipment Utilized During Treatment: Gait belt Activity Tolerance: Patient tolerated treatment well Patient left: in chair;with call bell/phone within reach;with chair alarm set Nurse Communication: Mobility status PT Visit Diagnosis: Unsteadiness on feet (R26.81);Muscle weakness (generalized) (M62.81);Difficulty in walking, not elsewhere  classified (R26.2);Other symptoms and signs involving the nervous system (G64.403)    Time: 4742-5956 PT Time Calculation (min) (  ACUTE ONLY): 26 min   Charges:   PT Evaluation $PT Eval Moderate Complexity: 1 Mod PT Treatments $Therapeutic Activity: 8-22 mins        Nahome Bublitz A. Gilford Rile PT, DPT Acute Rehabilitation Services Pager [phone] Office [phone]   Linna Hoff 01/17/2021, 4:11 PM

## 2021-01-17 NOTE — Progress Notes (Signed)
Patient continues to improve.  She is bright and alert this morning.  She is speaking well.  Her right-sided weakness is much improved she is now easily better than her antigravity and is moving her right arm spontaneously.  Her right leg remains a little slower.  Wound is clean and dry.  Chest and abdomen benign.  She is afebrile.  Her vitals are stable.  Progressing well following removal of large parafalcine meningioma.  Continue efforts at therapy.

## 2021-01-18 LAB — GLUCOSE, CAPILLARY
Glucose-Capillary: 128 mg/dL — ABNORMAL HIGH (ref 70–99)
Glucose-Capillary: 189 mg/dL — ABNORMAL HIGH (ref 70–99)
Glucose-Capillary: 136 mg/dL — ABNORMAL HIGH (ref 70–99)
Glucose-Capillary: 137 mg/dL — ABNORMAL HIGH (ref 70–99)

## 2021-01-18 MED ORDER — LEVETIRACETAM 500 MG PO TABS
500.0000 mg | ORAL_TABLET | Freq: Two times a day (BID) | ORAL | Status: DC
  Administered 2021-01-18 – 2021-01-23 (×10): 500 mg via ORAL
  Filled 2021-01-18 (×11): qty 1

## 2021-01-18 MED ORDER — DEXAMETHASONE 2 MG PO TABS
2.0000 mg | ORAL_TABLET | Freq: Two times a day (BID) | ORAL | Status: DC
  Administered 2021-01-18 – 2021-01-23 (×10): 2 mg via ORAL
  Filled 2021-01-18 (×11): qty 1

## 2021-01-18 NOTE — Progress Notes (Signed)
Physical Therapy Treatment Patient Details Name: Ann Chaney MRN: [id number] DOB: [birth year] Today's Date: 01/18/2021   History of Present Illness 75 y/o female s/p bifrontal craniotomy for L frontal parasagittal extra-axial meningioma resection on 10/24. Following procedure, patient with decreased arousal and R sided weakness. Post op MRI revealed mass resection without evidence of residual and small scattered infarcts in bilateral cerebrum. PMH: DM type 2, glaucoma, HLD, HTN.    PT Comments    Initially, patient following all commands and answering questions appropriately. With fatigue, decreased responses and intermittent command following. Daughter present and supportive. Patient requires modA+2 for ambulating towards recliner with assist for advancing R LE at times. Cues for attending to R side during mobility. Performed seated exercises and encouraged daughter to motivate patient to perform while therapy is not present. Continue to recommend comprehensive inpatient rehab (CIR) for post-acute therapy needs.     Recommendations for follow up therapy are one component of a multi-disciplinary discharge planning process, led by the attending physician.  Recommendations may be updated based on patient status, additional functional criteria and insurance authorization.  Follow Up Recommendations  Acute inpatient rehab (3hours/day)     Assistance Recommended at Discharge Frequent or constant Supervision/Assistance  Equipment Recommendations  Other (comment) (TBD)    Recommendations for Other Services       Precautions / Restrictions Precautions Precautions: Fall Restrictions Weight Bearing Restrictions: No     Mobility  Bed Mobility Overal bed mobility: Needs Assistance Bed Mobility: Supine to Sit     Supine to sit: Min assist     General bed mobility comments: minA for scooting towards EOB, max cues required for sequencing and attending to R LE    Transfers Overall  transfer level: Needs assistance Equipment used: 2 person hand held assist Transfers: Sit to/from Stand Sit to Stand: Mod assist;+2 physical assistance           General transfer comment: modA+2 to stand from EOB, cues for hand placement    Ambulation/Gait Ambulation/Gait assistance: Mod assist;+2 physical assistance Gait Distance (Feet): 5 Feet Assistive device: 2 person hand held assist Gait Pattern/deviations: Step-to pattern;Decreased stride length;Decreased step length - right;Decreased weight shift to right;Narrow base of support Gait velocity: decreased   General Gait Details: Requires assist to advance R LE intermittently. ModA+2 for balance. Max cues for sequencing steps towards recliner. Difficulty advancing RLE and following commands with fatigue   Stairs             Wheelchair Mobility    Modified Rankin (Stroke Patients Only) Modified Rankin (Stroke Patients Only) Pre-Morbid Rankin Score: No significant disability Modified Rankin: Moderately severe disability     Balance Overall balance assessment: Needs assistance Sitting-balance support: Bilateral upper extremity supported;Feet supported Sitting balance-Leahy Scale: Fair     Standing balance support: Bilateral upper extremity supported;During functional activity Standing balance-Leahy Scale: Poor                              Cognition Arousal/Alertness: Awake/alert Behavior During Therapy: WFL for tasks assessed/performed Overall Cognitive Status: Impaired/Different from baseline Area of Impairment: Attention;Memory;Following commands;Safety/judgement;Awareness;Problem solving                   Current Attention Level: Sustained Memory: Decreased recall of precautions;Decreased short-term memory Following Commands: Follows one step commands inconsistently Safety/Judgement: Decreased awareness of safety;Decreased awareness of deficits Awareness: Intellectual Problem  Solving: Slow processing;Decreased initiation;Difficulty sequencing General Comments: initially answering questions  and following commands appropriately. with fatigue, following commands inconsistently. Laughing at times when she doesn't understand command        Exercises General Exercises - Lower Extremity Long Arc Quad: AAROM;Right;AROM;Left;5 reps;Seated Hip Flexion/Marching: AAROM;Right;AROM;Left;5 reps;Seated    General Comments        Pertinent Vitals/Pain Pain Assessment: No/denies pain    Home Living                          Prior Function            PT Goals (current goals can now be found in the care plan section) Acute Rehab PT Goals Patient Stated Goal: did not state PT Goal Formulation: With patient Time For Goal Achievement: 01/31/21 Potential to Achieve Goals: Good Progress towards PT goals: Progressing toward goals    Frequency    Min 4X/week      PT Plan Current plan remains appropriate    Co-evaluation              AM-PAC PT "6 Clicks" Mobility   Outcome Measure  Help needed turning from your back to your side while in a flat bed without using bedrails?: A Little Help needed moving from lying on your back to sitting on the side of a flat bed without using bedrails?: A Little Help needed moving to and from a bed to a chair (including a wheelchair)?: Total Help needed standing up from a chair using your arms (e.g., wheelchair or bedside chair)?: Total Help needed to walk in hospital room?: Total Help needed climbing 3-5 steps with a railing? : Total 6 Click Score: 10    End of Session Equipment Utilized During Treatment: Gait belt Activity Tolerance: Patient tolerated treatment well Patient left: in chair;with call bell/phone within reach;with chair alarm set;with family/visitor present Nurse Communication: Mobility status PT Visit Diagnosis: Unsteadiness on feet (R26.81);Muscle weakness (generalized) (M62.81);Difficulty in  walking, not elsewhere classified (R26.2);Other symptoms and signs involving the nervous system (R29.898)     Time: 6812-7517 PT Time Calculation (min) (ACUTE ONLY): 25 min  Charges:  $Gait Training: 8-22 mins $Therapeutic Exercise: 8-22 mins                     Marc Leichter A. Gilford Rile PT, DPT Acute Rehabilitation Services Pager [phone] Office [phone]    Linna Hoff 01/18/2021, 12:50 PM

## 2021-01-18 NOTE — Progress Notes (Signed)
Postop day 4.  Patient continues to improve.  She is a Scientist, water quality.  She is conversant appropriately.  She still has some right hemiparesis but this is gradually improving and she is able to use her right side to feed herself and hold objects.  Her wound is clean and dry.  Her neck is supple.  Her chest and abdomen are benign.  Status post bifrontal craniotomy for resection of large left parasagittal meningioma.  Patient progressing well.  Continue efforts at rehab and mobilization.  Hopefully patient will be transferred to the rehabilitation unit early next week for further convalescence.

## 2021-01-18 NOTE — Progress Notes (Signed)
Inpatient Rehabilitation Admissions Coordinator   Inpatient rehab consult received. I met with patient and her daughter at bedside. Prior to admit patient lived alone and was independent but with declining cognition. Daughter was preparing to move in with her Mom to provide 24/7 supervision. I discussed goals and expectations of a possible CIR admit and they are in agreement. She is a great candidate for acute inpatient rehab admission/CIR. I will begin Auth with [facility] medicare.  Danne Baxter, RN, MSN Rehab Admissions Coordinator [phone] 01/18/2021 11:34 AM

## 2021-01-18 NOTE — PMR Pre-admission (Signed)
PMR Admission Coordinator Pre-Admission Assessment  Patient: Ann Chaney is an 75 y.o., female MRN: [id number] DOB: [birth year] Height: $RemoveBeforeDE'5\' 5"'BSherHgFulmpMWo$  (165.1 cm) Weight: 57 kg  Insurance Information HMO: yes    PPO:      PCP:      IPA:      80/20:      OTHER:  PRIMARY: [employer] Medicare      Policy#: [id number]      Subscriber: pt CM Name: Frances Furbish      Phone#: [phone] option # 7     Fax#: [phone] Pre-Cert#: [id number] approved for 7 days     Benefits:  Phone #: [phone]     Name: 10/28 Eff. Date: 03/24/2020     Deduct: none      Out of Pocket Max: $4500      Life Max: none CIR: $325 co pay per day days 1 until 5      SNF: no copay days 1 until 20; $188 co pay per day days 21 until 44; no copay days 45 until 100 Outpatient: $30 per visit     Co-Pay: visits per medical neccesity Home Health: 100%      Co-Pay: visits per medical neccesity DME: 80%     Co-Pay: 20% Providers: in network  SECONDARY: none        Financial Counselor:       Phone#:   [employer], [employer]" for patients in Inpatient Rehabilitation Facilities with attached "Privacy Act [place] Records" was provided and verbally reviewed with: Family  Emergency Contact Information Contact Information     Name Relation Home Work Mobile   [place] Daughter [phone]     [place]ond[place] Moh Sister [phone]         Current Medical History  Patient Admitting Diagnosis: Meningioma resection  History of Present Illness: 75 year old female with history or progressive memory and cognitive issues. Screened for possible Alzheimer's disease and MRI scan performed that revealed large frontal parasagittal extra-axial tumor consistent with meningioma with significant mass effect. Patient without evidence of history of metastatic disease. Presented on 10/24/200 for craniotomy and resection of tumor.  Postoperative with no witnessed seizures nor was there any activity on EEG monitoring.  Postoperative with right sided weakness. 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   Patient's medical record from [facility]  has been reviewed by the rehabilitation admission coordinator and physician.  Past Medical History  Past Medical History:  Diagnosis Date   Diabetes mellitus without complication ([place])    Glaucoma    Glaucoma    Hyperlipidemia    Hypertension    Has the patient had major surgery during 100 days prior to admission? Yes  Family History   family history includes Breast cancer in her maternal aunt; Cancer in her mother; Dementia in her father; Diabetes in her mother; Emphysema in her mother; Glaucoma in her father.  Current Medications  Current Facility-Administered Medications:    acetaminophen (TYLENOL) tablet 650 mg, 650 mg, Oral, Q4H PRN, 650 mg at 01/19/21 2130 **OR** acetaminophen (TYLENOL) suppository 650 mg, 650 mg, Rectal, Q4H PRN, Earnie Larsson, MD   Chlorhexidine Gluconate Cloth 2 % PADS 6 each, 6 each, Topical, Daily, Earnie Larsson, MD, 6 each at 01/22/21 0906   dexamethasone (DECADRON) tablet 2 mg, 2 mg, Oral, BID, Earnie Larsson, MD, 2 mg at 01/22/21 2127   glipiZIDE (GLUCOTROL) tablet 5 mg, 5 mg, Oral, Q breakfast, Earnie Larsson, MD, 5  mg at 01/22/21 0905   HYDROcodone-acetaminophen (NORCO/VICODIN) 5-325 MG per tablet 1 tablet, 1 tablet, Oral, Q4H PRN, Earnie Larsson, MD, 1 tablet at 01/16/21 1648   HYDROmorphone (DILAUDID) injection 0.5-1 mg, 0.5-1 mg, Intravenous, Q2H PRN, Earnie Larsson, MD   influenza vaccine adjuvanted (FLUAD) injection 0.5 mL, 0.5 mL, Intramuscular, Tomorrow-1000, Pool, Henry, MD   insulin aspart (novoLOG) injection 0-9 Units, 0-9 Units, Subcutaneous, TID WC, Bergman, Meghan D, NP, 1 Units at 01/23/21 0642   latanoprost (XALATAN) 0.005 % ophthalmic solution 1 drop, 1 drop, Both Eyes, QHS, Pool, Henry, MD, 1 drop at 01/22/21 2127   levETIRAcetam (KEPPRA) tablet 500 mg, 500 mg, Oral, BID, Pool, Mallie Mussel, MD, 500 mg at  01/22/21 2127   lisinopril (ZESTRIL) tablet 40 mg, 40 mg, Oral, Daily, Pool, Mallie Mussel, MD, 40 mg at 01/22/21 0786   metFORMIN (GLUCOPHAGE) tablet 1,000 mg, 1,000 mg, Oral, BID WC, Pool, Mallie Mussel, MD, 1,000 mg at 01/22/21 1756   methimazole (TAPAZOLE) tablet 2.5 mg, 2.5 mg, Oral, q AM, Pool, Mallie Mussel, MD, 2.5 mg at 01/23/21 7544   metoprolol succinate (TOPROL-XL) 24 hr tablet 50 mg, 50 mg, Oral, Daily, Pool, Mallie Mussel, MD, 50 mg at 01/22/21 0906   multivitamin with minerals tablet 1 tablet, 1 tablet, Oral, Daily, Pool, Mallie Mussel, MD, 1 tablet at 01/22/21 0905   ondansetron (ZOFRAN) tablet 4 mg, 4 mg, Oral, Q4H PRN **OR** ondansetron (ZOFRAN) injection 4 mg, 4 mg, Intravenous, Q4H PRN, Pool, Mallie Mussel, MD, 4 mg at 01/15/21 0519   pantoprazole (PROTONIX) EC tablet 40 mg, 40 mg, Oral, QHS, Pool, Mallie Mussel, MD, 40 mg at 01/22/21 2127   pioglitazone (ACTOS) tablet 15 mg, 15 mg, Oral, Q breakfast, Pool, Mallie Mussel, MD, 15 mg at 01/22/21 9201   promethazine (PHENERGAN) tablet 12.5-25 mg, 12.5-25 mg, Oral, Q4H PRN, Earnie Larsson, MD  Patients Current Diet:  Diet Order             Diet Carb Modified Fluid consistency: Thin; Room service appropriate? Yes with Assist  Diet effective now                  Precautions / Restrictions Precautions Precautions: Fall Precaution Comments: R hemiparesis Restrictions Weight Bearing Restrictions: No   Has the patient had 2 or more falls or a fall with injury in the past year? No  Prior Activity Level Limited Community (1-2x/wk): Independent and living alone until few months prior with declining cognition  Prior Functional Level Self Care: Did the patient need help bathing, dressing, using the toilet or eating? Independent  Indoor Mobility: Did the patient need assistance with walking from room to room (with or without device)? Independent  Stairs: Did the patient need assistance with internal or external stairs (with or without device)? Independent  Functional Cognition: Did the  patient need help planning regular tasks such as shopping or remembering to take medications? Independent  Patient Information Are you of Hispanic, Latino/a,or Spanish origin?: A. No, not of Hispanic, Latino/a, or Spanish origin What is your race?: B. Black or African American Do you need or want an interpreter to communicate with a doctor or health care staff?: 0. No  Patient's Response To:  Health Literacy and Transportation Is the patient able to respond to health literacy and transportation needs?: No Health Literacy - How often do you need to have someone help you when you read instructions, pamphlets, or other written material from your doctor or pharmacy?: Patient unable to respond In the past 12 months, has lack of transportation kept  you from medical appointments or from getting medications?: No In the past 12 months, has lack of transportation kept you from meetings, work, or from getting things needed for daily living?: No  Development worker, 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 Devices/Equipment: CBG Meter, Eyeglasses Home Equipment: None  Prior Device Use: Indicate devices/aids used by the patient prior to current illness, exacerbation or injury? None of the above  Current Functional Level Cognition  Overall Cognitive Status: Impaired/Different from baseline Current Attention Level: Sustained Orientation Level: Disoriented to time Following Commands: Follows one step commands with increased time Safety/Judgement: Decreased awareness of safety, Decreased awareness of deficits General Comments: Pt follows most functional commands, answers some simple yes/no questions with increased time to respond, often needs repetition of question prior to response. Pt very incontinent of bladder while walking a few feet around bed but when asked by therapist just prior to this if she needed to go to bathroom, pt stated no.    Extremity Assessment (includes Sensation/Coordination)  Upper  Extremity Assessment: Defer to OT evaluation RUE Deficits / Details: limited overhead ROM, able to touch the back of her head. Elbow ROM is WFL, MMT is 3/5 for elbow. poor grip strength. Pt noted not attending to RUE during funcitonal task and required assistance for safe placement during bed mobility RUE Sensation: decreased light touch RUE Coordination: decreased fine motor, decreased gross motor  Lower Extremity Assessment: RLE deficits/detail RLE Deficits / Details: grossly 4-/5, difficult to fully assess due to cognition RLE Sensation: decreased light touch RLE Coordination: decreased gross motor, decreased fine motor    ADLs  Overall ADL's : Needs assistance/impaired Eating/Feeding: Moderate assistance, Sitting Eating/Feeding Details (indicate cue type and reason): provided a rolled wash cloth to increase grasp for R hand. pt unaware when cup fell out of hand. pt holding straw in her mouth but unaware it is not in the cup. Grooming: Wash/dry hands, Minimal assistance Upper Body Bathing: Moderate assistance Lower Body Bathing: Minimal assistance, Sit to/from stand Lower Body Bathing Details (indicate cue type and reason): min A for verbal cues and balance in standing to bathe peri area Upper Body Dressing : Moderate assistance Lower Body Dressing: Minimal assistance, Sit to/from stand Lower Body Dressing Details (indicate cue type and reason): pt doffed with min A, doffed bilat socks and donned dry pai wtih min A for RLE Toilet Transfer: Minimal assistance, Ambulation Toilet Transfer Details (indicate cue type and reason): simulated from chair >bed, pt required min A for short ambulation Toileting- Clothing Manipulation and Hygiene: Minimal assistance, Sit to/from stand Toileting - Clothing Manipulation Details (indicate cue type and reason): incontinent bladder this session upon standing. Min A overall fro clean up Functional mobility during ADLs: Minimal assistance General ADL  Comments: pt with incontinent bladder during functional ambulation; session then spent on ADLs for clean up wtih several sit<>stands with 1 person [facility] assist    Mobility  Overal bed mobility: Needs Assistance Bed Mobility: Supine to Sit Rolling: Mod assist Sidelying to sit: Min assist Supine to sit: Min assist Sit to supine: Mod assist General bed mobility comments: cues for self-assist, HOB elevated to >40 degrees prior to transfer, good use of bed rail to get to EOB but needs heavy cues for contralateral hip scooting to foot flat.    Transfers  Overall transfer level: Needs assistance Equipment used: 1 person hand held assist, Rolling walker (2 wheels) Transfers: Sit to/from Stand Sit to Stand: Min guard, Min assist Stand pivot transfers: +2 physical assistance, Mod assist  General transfer comment: minA for initial sit>stand to RW and stand>sit to toilet, then min guard from [facility] for sit>stand this session and to chair for stand>sit    Ambulation / Gait / Stairs / Wheelchair Mobility  Ambulation/Gait Ambulation/Gait assistance: Herbalist (Feet): 10 Feet (59ft to BSC, then 33ft to chair) Assistive device: Rolling walker (2 wheels) Gait Pattern/deviations: Decreased stride length, Wide base of support, Decreased dorsiflexion - right, Step-through pattern, Decreased step length - right General Gait Details: multimodal cues for improved RLE step length/height, pt incontinent of bladder while walking around end of bed and needed BSC brought to her (but had already emptied bladder) so sat down before final walk to get cleaned up/new socks and gown. Gait velocity: decreased Gait velocity interpretation: <1.31 ft/sec, indicative of household ambulator    Posture / Balance Dynamic Sitting Balance Sitting balance - Comments: initially holding rail on foot of bed, after seated and feet assisted to floor could sit with S no UE support Balance Overall balance assessment: Needs  assistance Sitting-balance support: Feet supported Sitting balance-Leahy Scale: Fair Sitting balance - Comments: initially holding rail on foot of bed, after seated and feet assisted to floor could sit with S no UE support Standing balance support: Single extremity supported, During functional activity Standing balance-Leahy Scale: Fair Standing balance comment: min guard to minA, increased assist with turning and for safety with stepping around obstacles    Special needs/care consideration Seizure precautions   Previous Home Environment  Living Arrangements: Alone  Lives With: Alone Available Help at Discharge: Family, Available 24 hours/day (daughter, [name], moving in with her) Type of Home: House Home Layout: One level Home Access: Level entry Bathroom Shower/Tub: Chiropodist: Standard Bathroom Accessibility: Yes How Accessible: Accessible via walker [place]: No Additional Comments: Dog names [place]  Discharge Living Setting Plans for Discharge Living Setting: Patient's home ([name], daughter, moving in with patient) Type of Home at Discharge: House Discharge Home Layout: One level Discharge Home Access: Level entry Discharge Bathroom Shower/Tub: Tub/shower unit Discharge Bathroom Toilet: Standard Discharge Bathroom Accessibility: Yes How Accessible: Accessible via walker Does the patient have any problems obtaining your medications?: No  Social/Family/Support Systems Patient Roles: Parent Contact Information: Virl Axe, Daughter Anticipated Caregiver: [name] Anticipated Caregiver's Contact Information: see contacts Ability/Limitations of Caregiver: [name] is retired Careers adviser: 24/7 Discharge Plan Discussed with Primary Caregiver: Yes Is Caregiver In Agreement with Plan?: Yes Does Caregiver/Family have Issues with Lodging/Transportation while Pt is in Rehab?: No  Goals Patient/Family Goal for Rehab: supervision to min assist with  PT, OT and SLP Expected length of stay: ELOS 10 to 14 days Pt/Family Agrees to Admission and willing to participate: Yes Program Orientation Provided & Reviewed with Pt/Caregiver Including Roles  & Responsibilities: Yes  Decrease burden of Care through IP rehab admission: n/a  Possible need for SNF placement upon discharge: not anticipated  Patient Condition: I have reviewed medical records from [facility] , spoken with CM, and patient and daughter. I met with patient at the bedside for inpatient rehabilitation assessment.  Patient will benefit from ongoing PT, OT, and SLP, can actively participate in 3 hours of therapy a day 5 days of the week, and can make measurable gains during the admission.  Patient will also benefit from the coordinated team approach during an Inpatient Acute Rehabilitation admission.  The patient will receive intensive therapy as well as Rehabilitation physician, nursing, social worker, and care management interventions.  Due to bladder  management, bowel management, safety, skin/wound care, disease management, medication administration, pain management, and patient education the patient requires 24 hour a day rehabilitation nursing.  The patient is currently min to mod assist overall with mobility and basic ADLs.  Discharge setting and therapy post discharge at  home with home health is anticipated.  Patient has agreed to participate in the Acute Inpatient Rehabilitation Program and will admit today.  Preadmission Screen Completed By:  Cleatrice Burke, 01/23/2021 10:05 AM ______________________________________________________________________   Discussed status with Dr. Ranell Patrick on 01/23/2021 at 1005 and received approval for admission today.  Admission Coordinator:  Cleatrice Burke, RN, time 1005 Date 01/23/2021   Assessment/Plan: Diagnosis: Meningioma Does the need for close, 24 hr/day Medical supervision in concert with the patient's rehab needs  make it unreasonable for this patient to be served in a less intensive setting? Yes Co-Morbidities requiring supervision/potential complications: dental abscess, goiter, hypertension associated with diabetes, smoker, insomnia Due to bladder management, bowel management, safety, skin/wound care, disease management, medication administration, pain management, and patient education, does the patient require 24 hr/day rehab nursing? Yes Does the patient require coordinated care of a physician, rehab nurse, PT, OT, and SLP to address physical and functional deficits in the context of the above medical diagnosis(es)? Yes Addressing deficits in the following areas: balance, endurance, locomotion, strength, transferring, bowel/bladder control, bathing, dressing, feeding, grooming, toileting, speech, and psychosocial support Can the patient actively participate in an intensive therapy program of at least 3 hrs of therapy 5 days a week? Yes The potential for patient to make measurable gains while on inpatient rehab is excellent Anticipated functional outcomes upon discharge from inpatient rehab: supervision PT, supervision OT, supervision SLP Estimated rehab length of stay to reach the above functional goals is: 10-14 days Anticipated discharge destination: Home 10. Overall Rehab/Functional Prognosis: excellent   MD Signature: Leeroy Cha, MD

## 2021-01-19 LAB — GLUCOSE, CAPILLARY
Glucose-Capillary: 140 mg/dL — ABNORMAL HIGH (ref 70–99)
Glucose-Capillary: 54 mg/dL — ABNORMAL LOW (ref 70–99)
Glucose-Capillary: 71 mg/dL (ref 70–99)
Glucose-Capillary: 38 mg/dL — CL (ref 70–99)
Glucose-Capillary: 114 mg/dL — ABNORMAL HIGH (ref 70–99)
Glucose-Capillary: 133 mg/dL — ABNORMAL HIGH (ref 70–99)

## 2021-01-19 NOTE — Progress Notes (Signed)
NEUROSURGERY PROGRESS NOTE  Doing well. Sitting up eating in bed. Still has some right sided hemiparesis. Continue therapies today. Awaiting rehab placement  Temp:  [97.3 F (36.3 C)-98.2 F (36.8 C)] 98.2 F (36.8 C) (10/29 0755) Pulse Rate:  [58-78] 58 (10/29 0755) Resp:  [12-18] 18 (10/29 0755) BP: (127-166)/(47-71) 145/47 (10/29 0755) SpO2:  [98 %-100 %] 100 % (10/29 0755)  Ann Chiquito, NP 01/19/2021 8:21 AM

## 2021-01-20 LAB — GLUCOSE, CAPILLARY
Glucose-Capillary: 175 mg/dL — ABNORMAL HIGH (ref 70–99)
Glucose-Capillary: 183 mg/dL — ABNORMAL HIGH (ref 70–99)
Glucose-Capillary: 114 mg/dL — ABNORMAL HIGH (ref 70–99)
Glucose-Capillary: 125 mg/dL — ABNORMAL HIGH (ref 70–99)
Glucose-Capillary: 106 mg/dL — ABNORMAL HIGH (ref 70–99)

## 2021-01-20 NOTE — Progress Notes (Signed)
NEUROSURGERY PROGRESS NOTE  Doing well. No acute events overnight. Postop crani for meningioma. Awaiting rehab placement. Continue therapies today  Temp:  [97.9 F (36.6 C)-98.3 F (36.8 C)] 98.1 F (36.7 C) (10/30 0350) Pulse Rate:  [58-66] 60 (10/30 0350) Resp:  [16-20] 18 (10/30 0350) BP: (105-145)/(47-63) 114/53 (10/30 0350) SpO2:  [98 %-100 %] 100 % (10/30 0350)   Eleonore Chiquito, NP 01/20/2021 7:22 AM

## 2021-01-20 NOTE — Plan of Care (Signed)

## 2021-01-20 NOTE — Plan of Care (Signed)
  Problem: Coping: Goal: Level of anxiety will decrease 01/20/2021 1204 by Delia Chimes, RN Outcome: Progressing 01/20/2021 1201 by Delia Chimes, RN Outcome: Progressing   Problem: Elimination: Goal: Will not experience complications related to bowel motility 01/20/2021 1204 by Delia Chimes, RN Outcome: Progressing 01/20/2021 1201 by Delia Chimes, RN Outcome: Progressing Goal: Will not experience complications related to urinary retention 01/20/2021 1204 by Delia Chimes, RN Outcome: Progressing 01/20/2021 1201 by Delia Chimes, RN Outcome: Progressing   Problem: Safety: Goal: Ability to remain free from injury will improve 01/20/2021 1204 by Delia Chimes, RN Outcome: Progressing 01/20/2021 1201 by Delia Chimes, RN Outcome: Progressing   Problem: Pain Managment: Goal: General experience of comfort will improve 01/20/2021 1204 by Delia Chimes, RN Outcome: Progressing 01/20/2021 1201 by Delia Chimes, RN Outcome: Progressing   Problem: Skin Integrity: Goal: Risk for impaired skin integrity will decrease 01/20/2021 1204 by Delia Chimes, RN Outcome: Progressing 01/20/2021 1201 by Delia Chimes, RN Outcome: Progressing

## 2021-01-21 LAB — GLUCOSE, CAPILLARY
Glucose-Capillary: 146 mg/dL — ABNORMAL HIGH (ref 70–99)
Glucose-Capillary: 120 mg/dL — ABNORMAL HIGH (ref 70–99)
Glucose-Capillary: 102 mg/dL — ABNORMAL HIGH (ref 70–99)
Glucose-Capillary: 169 mg/dL — ABNORMAL HIGH (ref 70–99)

## 2021-01-21 NOTE — Progress Notes (Signed)
Inpatient Rehab Admissions Coordinator:   Following for my colleague Danne Baxter.  We are awaiting determination from [facility] regarding prior auth request, and we will not have a bed for her today.  Will f/u with her at the bedside to let her know.   Shann Medal, PT, DPT Admissions Coordinator [phone] 01/21/21  10:12 AM

## 2021-01-21 NOTE — Progress Notes (Signed)
Physical Therapy Treatment Patient Details Name: Ann Chaney MRN: [id number] DOB: March 13, 1946 Today's Date: 01/21/2021   History of Present Illness 75 y/o female s/p bifrontal craniotomy for L frontal parasagittal extra-axial meningioma resection on 10/24. Following procedure, patient with decreased arousal and R sided weakness. Post op MRI revealed mass resection without evidence of residual and small scattered infarcts in bilateral cerebrum. PMH: DM type 2, glaucoma, HLD, HTN.    PT Comments    Patient progressing with increased distance for ambulation this session.  She was able to utilize walker with A to keep L hand on walker and with assistance for balance and occasionally for R LE progression.  She was able to follow one step commands with increased time throughout the session.  She will benefit from further skilled PT in the inpatient rehab setting prior to d/c home.    Recommendations for follow up therapy are one component of a multi-disciplinary discharge planning process, led by the attending physician.  Recommendations may be updated based on patient status, additional functional criteria and insurance authorization.  Follow Up Recommendations  Acute inpatient rehab (3hours/day)     Assistance Recommended at Discharge    Equipment Recommendations  Other (comment) (TBA)    Recommendations for Other Services       Precautions / Restrictions Precautions Precautions: Fall Precaution Comments: R hemiparesis     Mobility  Bed Mobility Overal bed mobility: Needs Assistance Bed Mobility: Rolling;Sidelying to Sit Rolling: Mod assist Sidelying to sit: Min assist Supine to sit: Min assist     General bed mobility comments: cues for technique, assist to flex L leg and to reach/find rail with L UE, assist for R leg off the bed and pt pushed on rail to lift trunk    Transfers   Equipment used: Rolling walker (2 wheels) Transfers: Sit to/from Stand Sit to Stand: Mod  assist           General transfer comment: assist for R foot placement and for balance    Ambulation/Gait Ambulation/Gait assistance: Mod assist Gait Distance (Feet): 22 Feet Assistive device: Rolling walker (2 wheels) Gait Pattern/deviations: Step-to pattern;Decreased stride length;Wide base of support;Shuffle;Decreased dorsiflexion - right     General Gait Details: dragging R foot at times or needing assist to initiate stepping, assist for walker management and keeping LUE on walker.   Stairs             Wheelchair Mobility    Modified Rankin (Stroke Patients Only) Modified Rankin (Stroke Patients Only) Pre-Morbid Rankin Score: No significant disability Modified Rankin: Moderately severe disability     Balance Overall balance assessment: Needs assistance Sitting-balance support: Feet supported Sitting balance-Leahy Scale: Poor Sitting balance - Comments: initially holding rail on foot of bed, after seated and feet assisted to floor could sit with S no UE support   Standing balance support: Bilateral upper extremity supported;During functional activity;Reliant on assistive device for balance Standing balance-Leahy Scale: Poor                              Cognition Arousal/Alertness: Awake/alert Behavior During Therapy: WFL for tasks assessed/performed Overall Cognitive Status: Impaired/Different from baseline Area of Impairment: Attention;Memory;Following commands;Safety/judgement;Awareness;Problem solving                   Current Attention Level: Sustained Memory: Decreased recall of precautions;Decreased short-term memory Following Commands: Follows one step commands consistently;Follows one step commands with increased time Safety/Judgement:  Decreased awareness of safety;Decreased awareness of deficits   Problem Solving: Slow processing;Decreased initiation General Comments: laughing to cover limited understanding at times         Exercises      General Comments        Pertinent Vitals/Pain Pain Assessment: No/denies pain    Home Living                          Prior Function            PT Goals (current goals can now be found in the care plan section) Progress towards PT goals: Progressing toward goals    Frequency    Min 4X/week      PT Plan Current plan remains appropriate    Co-evaluation              AM-PAC PT "6 Clicks" Mobility   Outcome Measure  Help needed turning from your back to your side while in a flat bed without using bedrails?: A Little Help needed moving from lying on your back to sitting on the side of a flat bed without using bedrails?: A Little Help needed moving to and from a bed to a chair (including a wheelchair)?: A Lot Help needed standing up from a chair using your arms (e.g., wheelchair or bedside chair)?: A Lot Help needed to walk in hospital room?: A Lot Help needed climbing 3-5 steps with a railing? : Total 6 Click Score: 13    End of Session Equipment Utilized During Treatment: Gait belt Activity Tolerance: Patient tolerated treatment well Patient left: in chair;with call bell/phone within reach;with chair alarm set   PT Visit Diagnosis: Other abnormalities of gait and mobility (R26.89);Hemiplegia and hemiparesis Hemiplegia - Right/Left: Right Hemiplegia - dominant/non-dominant: Dominant Hemiplegia - caused by: Unspecified (meningioma)     Time: 9794-8016 PT Time Calculation (min) (ACUTE ONLY): 19 min  Charges:  $Gait Training: 8-22 mins                     Magda Kiel, PT Acute Rehabilitation Services [facility]:[phone] [phone] 01/21/2021    Reginia Naas 01/21/2021, 1:44 PM

## 2021-01-21 NOTE — Progress Notes (Signed)
Occupational Therapy Treatment Patient Details Name: Ann Chaney MRN: [id number] DOB: 07-Aug-1945 Today's Date: 01/21/2021   History of present illness 75 y/o female s/p bifrontal craniotomy for L frontal parasagittal extra-axial meningioma resection on 10/24. Following procedure, patient with decreased arousal and R sided weakness. Post op MRI revealed mass resection without evidence of residual and small scattered infarcts in bilateral cerebrum. PMH: DM type 2, glaucoma, HLD, HTN.   OT comments  [place] is incrementally progressing, session focused on OOB lower body ADLs after incontinent bladder episode. Overall pt was close min guard for several sit<>Stands, and up to min A for lower body bathing and dressing for RLE management. She required verbal cues for problem solving and initiation of tasks; throughout session pt was smiling and laughing when asked questions or given directions she didn't understand, ex. "Do you wan to go back to bed?" Pt's family member stated this was her baseline. Pt continues to benefit from OT acutely. D/c recommendation remains appropriate.    Recommendations for follow up therapy are one component of a multi-disciplinary discharge planning process, led by the attending physician.  Recommendations may be updated based on patient status, additional functional criteria and insurance authorization.    Follow Up Recommendations  Acute inpatient rehab (3hours/day)    Assistance Recommended at Discharge Frequent or constant Supervision/Assistance  Equipment Recommendations  [facility]       Precautions / Restrictions Precautions Precautions: Fall Precaution Comments: R hemiparesis       Mobility Bed Mobility Overal bed mobility: Needs Assistance Bed Mobility: Sit to Supine Rolling: Mod assist Sidelying to sit: Min assist Supine to sit: Min assist Sit to supine: Mod assist   General bed mobility comments: pt had dificulty poblem splving how to transfer from  sit>sup; mod A for safe positioning of RUE and to adjust hips/trunk towards middle of bed with use of chuck pad    Transfers Overall transfer level: Needs assistance Equipment used: 1 person hand held assist Transfers: Sit to/from Stand Sit to Stand: Min guard           General transfer comment: close min guard f roseveral sit<>stand this session, pt required minA for funcitonal ambulation     Balance Overall balance assessment: Needs assistance Sitting-balance support: Feet supported Sitting balance-Leahy Scale: Fair Sitting balance - Comments: initially holding rail on foot of bed, after seated and feet assisted to floor could sit with S no UE support   Standing balance support: Single extremity supported;During functional activity Standing balance-Leahy Scale: Fair Standing balance comment: pt able to bathe peri area in standing with 1UE supported on external surface           ADL either performed or assessed with clinical judgement   ADL Overall ADL's : Needs assistance/impaired             Lower Body Bathing: Minimal assistance;Sit to/from stand Lower Body Bathing Details (indicate cue type and reason): min A for verbal cues and balance in standing to bathe peri area     Lower Body Dressing: Minimal assistance;Sit to/from stand Lower Body Dressing Details (indicate cue type and reason): pt doffed with min A, doffed bilat socks and donned dry pai wtih min A for RLE Toilet Transfer: Minimal assistance;Ambulation Toilet Transfer Details (indicate cue type and reason): simulated from chair >bed, pt required min A for short ambulation Toileting- Clothing Manipulation and Hygiene: Minimal assistance;Sit to/from stand Toileting - Clothing Manipulation Details (indicate cue type and reason): incontinent bladder this session upon  standing. Min A overall fro clean up     Functional mobility during ADLs: Minimal assistance General ADL Comments: pt with incontinent bladder  during functional ambulation; session then spent on ADLs for clean up wtih several sit<>stands with 1 person HH assist      Cognition Arousal/Alertness: Awake/alert Behavior During Therapy: WFL for tasks assessed/performed Overall Cognitive Status: Impaired/Different from baseline Area of Impairment: Memory;Following commands;Safety/judgement;Awareness;Problem solving                   Current Attention Level: Sustained Memory: Decreased short-term memory Following Commands: Follows one step commands consistently;Follows one step commands with increased time Safety/Judgement: Decreased awareness of safety;Decreased awareness of deficits Awareness: Intellectual Problem Solving: Slow processing;Decreased initiation General Comments: Pt follows most functional commands, was not able to answer simple yes/no questions. Pt would smile and laugh in avoidance/ mis-understanding of questions or commands. Family in the room and stated this is her baseline, no better or worse than what they have been seeing since the tumor finding                General Comments VSS on RA - family present and supportive    Pertinent Vitals/ Pain       Pain Assessment: No/denies pain   Frequency  Min 2X/week        Progress Toward Goals  OT Goals(current goals can now be found in the care plan section)  Progress towards OT goals: Progressing toward goals  Acute Rehab OT Goals OT Goal Formulation: Patient unable to participate in goal setting Time For Goal Achievement: 01/31/21 Potential to Achieve Goals: Good ADL Goals Pt Will Perform Eating: with set-up;sitting;with adaptive utensils Pt Will Perform Grooming: with min guard assist;sitting;with adaptive equipment Pt Will Transfer to Toilet: with mod assist;ambulating;bedside commode Additional ADL Goal #1: pt will complete bed mobility min (A) as precursor to adls Additional ADL Goal #2: pt will demonstrates 2 step command with verbal  cue only 50% of attempts  Plan Discharge plan remains appropriate       AM-PAC OT "6 Clicks" Daily Activity     Outcome Measure   Help from another person eating meals?: A Little Help from another person taking care of personal grooming?: A Little Help from another person toileting, which includes using toliet, bedpan, or urinal?: A Lot Help from another person bathing (including washing, rinsing, drying)?: A Lot Help from another person to put on and taking off regular upper body clothing?: A Lot Help from another person to put on and taking off regular lower body clothing?: A Lot 6 Click Score: 14    End of Session Equipment Utilized During Treatment: Gait belt  OT Visit Diagnosis: Unsteadiness on feet (R26.81);Muscle weakness (generalized) (M62.81)   Activity Tolerance Patient tolerated treatment well   Patient Left in bed;with call bell/phone within reach;with family/visitor present   Nurse Communication Mobility status (pt with incontinent bladder, purwick removed for hygiene)        Time: 1327 (1327)-1350 OT Time Calculation (min): 23 min  Charges: OT General Charges $OT Visit: 1 Visit OT Treatments $Self Care/Home Management : 23-37 mins  Stephene Alegria A Sharilynn Cassity 01/21/2021, 2:08 PM

## 2021-01-21 NOTE — Progress Notes (Signed)
Patient continues to improve.  Only with minimal right-sided hemiparesis now.  Denies headache.  No wound issues.  She is awake and alert.  She is afebrile.  Vitals are stable.  4+/5 strength on the right.  Wound clean and dry.  Neck supple.  Progressing very well following craniotomy and resection of large parasagittal meningioma.  Awaiting transfer to rehabilitation when bed available.

## 2021-01-21 NOTE — Care Management Important Message (Signed)
Important Message  Patient Details  Name: Ann Chaney MRN: [id number] Date of Birth: 1945-05-17   Medicare Important Message Given:  Yes     Orbie Pyo 01/21/2021, 2:16 PM

## 2021-01-22 LAB — GLUCOSE, CAPILLARY
Glucose-Capillary: 146 mg/dL — ABNORMAL HIGH (ref 70–99)
Glucose-Capillary: 95 mg/dL (ref 70–99)
Glucose-Capillary: 118 mg/dL — ABNORMAL HIGH (ref 70–99)
Glucose-Capillary: 110 mg/dL — ABNORMAL HIGH (ref 70–99)

## 2021-01-22 NOTE — Progress Notes (Signed)
   Providing Compassionate, Quality Care - Together    Peer to peer with [employer] completed and approved. Confirmation should be sent electronically shortly.    Viona Gilmore, DNP, AGNP-C Nurse Practitioner 01/22/2021, 10:41 AM   [facility] [place] [address], [address], [place], [place] [address] P: [phone]    F: [phone]

## 2021-01-22 NOTE — Plan of Care (Signed)
Pt utilized the [facility] using a pivot turn. Pt also used the walker to stand and took a few steps. No c/o pain. Problem: Education: Goal: Knowledge of General Education information will improve Description: Including pain rating scale, medication(s)/side effects and non-pharmacologic comfort measures Outcome: Progressing   Problem: Health Behavior/Discharge Planning: Goal: Ability to manage health-related needs will improve Outcome: Progressing   Problem: Clinical Measurements: Goal: Ability to maintain clinical measurements within normal limits will improve Outcome: Progressing Goal: Will remain free from infection Outcome: Progressing Goal: Diagnostic test results will improve Outcome: Progressing Goal: Respiratory complications will improve Outcome: Progressing Goal: Cardiovascular complication will be avoided Outcome: Progressing   Problem: Activity: Goal: Risk for activity intolerance will decrease Outcome: Progressing   Problem: Nutrition: Goal: Adequate nutrition will be maintained Outcome: Progressing   Problem: Coping: Goal: Level of anxiety will decrease Outcome: Progressing   Problem: Elimination: Goal: Will not experience complications related to bowel motility Outcome: Progressing Goal: Will not experience complications related to urinary retention Outcome: Progressing   Problem: Pain Managment: Goal: General experience of comfort will improve Outcome: Progressing   Problem: Safety: Goal: Ability to remain free from injury will improve Outcome: Progressing   Problem: Skin Integrity: Goal: Risk for impaired skin integrity will decrease Outcome: Progressing

## 2021-01-22 NOTE — Progress Notes (Signed)
Inpatient Rehab Admissions Coordinator:   Received approval from  [facility].  Will follow for admission pending bed availability.  Shann Medal, PT, DPT Admissions Coordinator [phone] 01/22/21  11:18 AM

## 2021-01-22 NOTE — Progress Notes (Signed)
Patient continues to progress well.  Speech continues to improve.  Right-sided weakness continues to improve.  Wound healing well.  Making good progress with therapy.  Awaiting transfer to rehab for further convalescence.

## 2021-01-22 NOTE — Progress Notes (Signed)
Physical Therapy Treatment Patient Details Name: Ann Chaney MRN: [id number] DOB: 1945-04-01 Today's Date: 01/22/2021   History of Present Illness 75 y/o female s/p bifrontal craniotomy for L frontal parasagittal extra-axial meningioma resection on 10/24. Following procedure, patient with decreased arousal and R sided weakness. Post-op MRI revealed mass resection without evidence of residual and small scattered infarcts in bilateral cerebrum. PMH: DM type 2, glaucoma, HLD, HTN.    PT Comments    Pt received in supine, alert and participatory with encouragement, with slow processing of simple 1-step commands but good effort put forth throughout. Pt with episode of bladder incontinence during session and mild impulsivity as well as decreased carryover of safety cues, needing multimodal cues and increased time to follow hand placement instructions with transfers. Emphasis on safety with bed mobility/transfers, safe use of RW/upright posture, and step sequencing during gait trial with attempting longer stride on RLE. Pt continues to benefit from PT services to progress toward functional mobility goals. Continue to recommend CIR, plan to bring briefs next session as none available on floor (per RN on back order) and progress gait with chair follow.   Recommendations for follow up therapy are one component of a multi-disciplinary discharge planning process, led by the attending physician.  Recommendations may be updated based on patient status, additional functional criteria and insurance authorization.  Follow Up Recommendations  Acute inpatient rehab (3hours/day)     Assistance Recommended at Discharge    Equipment Recommendations  Other (comment) (TBA post-acute)    Recommendations for Other Services       Precautions / Restrictions Precautions Precautions: Fall Precaution Comments: R hemiparesis Restrictions Weight Bearing Restrictions: No     Mobility  Bed Mobility Overal bed  mobility: Needs Assistance Bed Mobility: Supine to Sit     Supine to sit: Min assist     General bed mobility comments: cues for self-assist, HOB elevated to >40 degrees prior to transfer, good use of bed rail to get to EOB but needs heavy cues for contralateral hip scooting to foot flat.    Transfers Overall transfer level: Needs assistance Equipment used: 1 person hand held assist;Rolling walker (2 wheels) Transfers: Sit to/from Stand Sit to Stand: Min guard;Min assist           General transfer comment: minA for initial sit>stand to RW and stand>sit to toilet, then min guard from [facility] for sit>stand this session and to chair for stand>sit    Ambulation/Gait Ambulation/Gait assistance: Min assist Gait Distance (Feet): 10 Feet (79ft to BSC, then 58ft to chair) Assistive device: Rolling walker (2 wheels) Gait Pattern/deviations: Decreased stride length;Wide base of support;Decreased dorsiflexion - right;Step-through pattern;Decreased step length - right Gait velocity: decreased Gait velocity interpretation: <1.31 ft/sec, indicative of household ambulator General Gait Details: multimodal cues for improved RLE step length/height, pt incontinent of bladder while walking around end of bed and needed BSC brought to her (but had already emptied bladder) so sat down before final walk to get cleaned up/new socks and gown.   Stairs             Wheelchair Mobility    Modified Rankin (Stroke Patients Only) Modified Rankin (Stroke Patients Only) Pre-Morbid Rankin Score: No significant disability Modified Rankin: Moderately severe disability     Balance Overall balance assessment: Needs assistance Sitting-balance support: Feet supported Sitting balance-Leahy Scale: Fair     Standing balance support: Single extremity supported;During functional activity Standing balance-Leahy Scale: Fair Standing balance comment: min guard to minA, increased assist  with turning and for safety  with stepping around obstacles                            Cognition Arousal/Alertness: Awake/alert Behavior During Therapy: WFL for tasks assessed/performed Overall Cognitive Status: Impaired/Different from baseline Area of Impairment: Memory;Following commands;Safety/judgement;Awareness;Problem solving                   Current Attention Level: Sustained Memory: Decreased short-term memory Following Commands: Follows one step commands with increased time Safety/Judgement: Decreased awareness of safety;Decreased awareness of deficits Awareness: Intellectual Problem Solving: Slow processing;Decreased initiation;Requires verbal cues;Requires tactile cues General Comments: Pt follows most functional commands, answers some simple yes/no questions with increased time to respond, often needs repetition of question prior to response. Pt very incontinent of bladder while walking a few feet around bed but when asked by therapist just prior to this if she needed to go to bathroom, pt stated no.        Exercises General Exercises - Lower Extremity Long Arc Quad: AROM;AAROM;Right;5 reps;Seated    General Comments General comments (skin integrity, edema, etc.): bring briefs next session, bladder incontinence; RN notified her floor was wiped but needs to be mopped. VSS on RA, no dizziness reported      Pertinent Vitals/Pain Pain Assessment: No/denies pain Breathing: normal Negative Vocalization: none Facial Expression: smiling or inexpressive Body Language: relaxed Consolability: no need to console PAINAD Score: 0    Home Living                          Prior Function            PT Goals (current goals can now be found in the care plan section) Acute Rehab PT Goals Patient Stated Goal: did not state PT Goal Formulation: With patient Time For Goal Achievement: 01/31/21 Progress towards PT goals: Progressing toward goals    Frequency    Min  4X/week      PT Plan Current plan remains appropriate    Co-evaluation              AM-PAC PT "6 Clicks" Mobility   Outcome Measure  Help needed turning from your back to your side while in a flat bed without using bedrails?: A Little Help needed moving from lying on your back to sitting on the side of a flat bed without using bedrails?: A Little Help needed moving to and from a bed to a chair (including a wheelchair)?: A Little Help needed standing up from a chair using your arms (e.g., wheelchair or bedside chair)?: A Little Help needed to walk in hospital room?: A Lot Help needed climbing 3-5 steps with a railing? : Total 6 Click Score: 15    End of Session Equipment Utilized During Treatment: Gait belt;Other (comment) (episode of incontinence limiting gait progression due to need for cleaning up/floor wiping) Activity Tolerance: Patient tolerated treatment well Patient left: in chair;with call bell/phone within reach;with chair alarm set Nurse Communication: Mobility status PT Visit Diagnosis: Other abnormalities of gait and mobility (R26.89);Hemiplegia and hemiparesis Hemiplegia - Right/Left: Right Hemiplegia - dominant/non-dominant: Dominant Hemiplegia - caused by: Unspecified (meningioma)     Time: 6606-3016 PT Time Calculation (min) (ACUTE ONLY): 26 min  Charges:  $Gait Training: 8-22 mins $Therapeutic Activity: 8-22 mins                     Lilyanna Lunt  P., PTA Acute Rehabilitation Services Pager: [phone] Office: [place] 01/22/2021, 6:01 PM

## 2021-01-23 ENCOUNTER — Encounter (HOSPITAL_COMMUNITY): Payer: Self-pay | Admitting: Physical Medicine & Rehabilitation

## 2021-01-23 ENCOUNTER — Other Ambulatory Visit: Payer: Self-pay | Admitting: Family Medicine

## 2021-01-23 ENCOUNTER — Inpatient Hospital Stay (HOSPITAL_COMMUNITY)
Admission: RE | Admit: 2021-01-23 | Discharge: 2021-02-07 | DRG: 949 | Disposition: A | Discharge status: Home Health | Payer: Medicare Other | Source: Intra-hospital | Attending: Physical Medicine & Rehabilitation | Admitting: Physical Medicine & Rehabilitation

## 2021-01-23 ENCOUNTER — Other Ambulatory Visit: Payer: Self-pay

## 2021-01-23 DIAGNOSIS — E119 Type 2 diabetes mellitus without complications: Secondary | ICD-10-CM | POA: Diagnosis not present

## 2021-01-23 DIAGNOSIS — E871 Hypo-osmolality and hyponatremia: Secondary | ICD-10-CM | POA: Diagnosis present

## 2021-01-23 DIAGNOSIS — Z803 Family history of malignant neoplasm of breast: Secondary | ICD-10-CM | POA: Diagnosis not present

## 2021-01-23 DIAGNOSIS — Z483 Aftercare following surgery for neoplasm: Principal | ICD-10-CM

## 2021-01-23 DIAGNOSIS — H4010X0 Unspecified open-angle glaucoma, stage unspecified: Secondary | ICD-10-CM | POA: Diagnosis present

## 2021-01-23 DIAGNOSIS — E039 Hypothyroidism, unspecified: Secondary | ICD-10-CM | POA: Diagnosis present

## 2021-01-23 DIAGNOSIS — E785 Hyperlipidemia, unspecified: Secondary | ICD-10-CM | POA: Diagnosis present

## 2021-01-23 DIAGNOSIS — Z83511 Family history of glaucoma: Secondary | ICD-10-CM | POA: Diagnosis not present

## 2021-01-23 DIAGNOSIS — R32 Unspecified urinary incontinence: Secondary | ICD-10-CM | POA: Diagnosis present

## 2021-01-23 DIAGNOSIS — G8191 Hemiplegia, unspecified affecting right dominant side: Secondary | ICD-10-CM | POA: Diagnosis present

## 2021-01-23 DIAGNOSIS — R2689 Other abnormalities of gait and mobility: Secondary | ICD-10-CM | POA: Diagnosis present

## 2021-01-23 DIAGNOSIS — Z9889 Other specified postprocedural states: Secondary | ICD-10-CM | POA: Diagnosis not present

## 2021-01-23 DIAGNOSIS — I10 Essential (primary) hypertension: Secondary | ICD-10-CM | POA: Diagnosis not present

## 2021-01-23 DIAGNOSIS — D72829 Elevated white blood cell count, unspecified: Secondary | ICD-10-CM | POA: Diagnosis present

## 2021-01-23 DIAGNOSIS — Z86011 Personal history of benign neoplasm of the brain: Secondary | ICD-10-CM | POA: Diagnosis not present

## 2021-01-23 DIAGNOSIS — F802 Mixed receptive-expressive language disorder: Secondary | ICD-10-CM | POA: Diagnosis present

## 2021-01-23 DIAGNOSIS — Z833 Family history of diabetes mellitus: Secondary | ICD-10-CM

## 2021-01-23 DIAGNOSIS — D329 Benign neoplasm of meninges, unspecified: Secondary | ICD-10-CM | POA: Diagnosis not present

## 2021-01-23 DIAGNOSIS — E1169 Type 2 diabetes mellitus with other specified complication: Secondary | ICD-10-CM | POA: Diagnosis present

## 2021-01-23 DIAGNOSIS — D62 Acute posthemorrhagic anemia: Secondary | ICD-10-CM | POA: Diagnosis not present

## 2021-01-23 DIAGNOSIS — Z86018 Personal history of other benign neoplasm: Secondary | ICD-10-CM | POA: Diagnosis not present

## 2021-01-23 DIAGNOSIS — E0520 Thyrotoxicosis with toxic multinodular goiter without thyrotoxic crisis or storm: Secondary | ICD-10-CM | POA: Diagnosis present

## 2021-01-23 DIAGNOSIS — G4700 Insomnia, unspecified: Secondary | ICD-10-CM | POA: Diagnosis present

## 2021-01-23 DIAGNOSIS — Z825 Family history of asthma and other chronic lower respiratory diseases: Secondary | ICD-10-CM

## 2021-01-23 DIAGNOSIS — E1159 Type 2 diabetes mellitus with other circulatory complications: Secondary | ICD-10-CM | POA: Diagnosis present

## 2021-01-23 DIAGNOSIS — Z888 Allergy status to other drugs, medicaments and biological substances status: Secondary | ICD-10-CM

## 2021-01-23 DIAGNOSIS — Z7985 Long-term (current) use of injectable non-insulin antidiabetic drugs: Secondary | ICD-10-CM | POA: Diagnosis not present

## 2021-01-23 DIAGNOSIS — H409 Unspecified glaucoma: Secondary | ICD-10-CM | POA: Diagnosis present

## 2021-01-23 DIAGNOSIS — Z7983 Long term (current) use of bisphosphonates: Secondary | ICD-10-CM

## 2021-01-23 DIAGNOSIS — Z87891 Personal history of nicotine dependence: Secondary | ICD-10-CM

## 2021-01-23 DIAGNOSIS — Z90710 Acquired absence of both cervix and uterus: Secondary | ICD-10-CM

## 2021-01-23 DIAGNOSIS — K5900 Constipation, unspecified: Secondary | ICD-10-CM | POA: Diagnosis present

## 2021-01-23 DIAGNOSIS — E042 Nontoxic multinodular goiter: Secondary | ICD-10-CM | POA: Diagnosis present

## 2021-01-23 DIAGNOSIS — K5901 Slow transit constipation: Secondary | ICD-10-CM | POA: Diagnosis not present

## 2021-01-23 DIAGNOSIS — I152 Hypertension secondary to endocrine disorders: Secondary | ICD-10-CM | POA: Diagnosis present

## 2021-01-23 DIAGNOSIS — Z79899 Other long term (current) drug therapy: Secondary | ICD-10-CM

## 2021-01-23 DIAGNOSIS — E0865 Diabetes mellitus due to underlying condition with hyperglycemia: Secondary | ICD-10-CM

## 2021-01-23 DIAGNOSIS — R5381 Other malaise: Secondary | ICD-10-CM | POA: Diagnosis not present

## 2021-01-23 DIAGNOSIS — E669 Obesity, unspecified: Secondary | ICD-10-CM | POA: Diagnosis not present

## 2021-01-23 LAB — GLUCOSE, CAPILLARY
Glucose-Capillary: 137 mg/dL — ABNORMAL HIGH (ref 70–99)
Glucose-Capillary: 80 mg/dL (ref 70–99)
Glucose-Capillary: 128 mg/dL — ABNORMAL HIGH (ref 70–99)
Glucose-Capillary: 94 mg/dL (ref 70–99)

## 2021-01-23 MED ORDER — LATANOPROST 0.005 % OP SOLN
1.0000 [drp] | Freq: Every day | OPHTHALMIC | Status: DC
  Administered 2021-01-23 – 2021-02-06 (×15): 1 [drp] via OPHTHALMIC
  Filled 2021-01-23 (×2): qty 2.5

## 2021-01-23 MED ORDER — DIPHENHYDRAMINE HCL 12.5 MG/5ML PO ELIX: 12.5000 mg | ORAL_SOLUTION | Freq: Four times a day (QID) | ORAL | Status: DC | PRN

## 2021-01-23 MED ORDER — ACETAMINOPHEN 325 MG PO TABS
325.0000 mg | ORAL_TABLET | ORAL | Status: DC | PRN
Start: 2021-01-23 — End: 2021-02-07
  Administered 2021-02-02 – 2021-02-03 (×3): 650 mg via ORAL
  Filled 2021-01-23 (×3): qty 2

## 2021-01-23 MED ORDER — ALUM & MAG HYDROXIDE-SIMETH 200-200-20 MG/5ML PO SUSP: 30.0000 mL | ORAL | Status: DC | PRN

## 2021-01-23 MED ORDER — POLYETHYLENE GLYCOL 3350 17 G PO PACK
17.0000 g | PACK | Freq: Every day | ORAL | Status: DC | PRN
  Administered 2021-02-04 – 2021-02-06 (×3): 17 g via ORAL
  Filled 2021-01-23 (×4): qty 1

## 2021-01-23 MED ORDER — LEVETIRACETAM 500 MG PO TABS: 500.0000 mg | ORAL_TABLET | Freq: Two times a day (BID) | ORAL | Status: DC

## 2021-01-23 MED ORDER — FLEET ENEMA 7-19 GM/118ML RE ENEM: 1.0000 | ENEMA | Freq: Once | RECTAL | Status: DC | PRN

## 2021-01-23 MED ORDER — DEXAMETHASONE 0.5 MG PO TABS
1.0000 mg | ORAL_TABLET | Freq: Two times a day (BID) | ORAL | Status: DC
  Administered 2021-01-23 – 2021-01-25 (×4): 1 mg via ORAL
  Filled 2021-01-23 (×5): qty 2

## 2021-01-23 MED ORDER — GLIPIZIDE 5 MG PO TABS
5.0000 mg | ORAL_TABLET | Freq: Every day | ORAL | Status: DC
  Administered 2021-01-24 – 2021-01-29 (×6): 5 mg via ORAL
  Filled 2021-01-23 (×6): qty 1

## 2021-01-23 MED ORDER — INSULIN ASPART 100 UNIT/ML IJ SOLN
0.0000 [IU] | Freq: Every day | INTRAMUSCULAR | Status: DC
Start: 2021-01-23 — End: 2021-02-07
  Administered 2021-01-30: 2 [IU] via SUBCUTANEOUS

## 2021-01-23 MED ORDER — LEVETIRACETAM 500 MG PO TABS
500.0000 mg | ORAL_TABLET | Freq: Two times a day (BID) | ORAL | 0 refills | Status: DC
Start: 2021-01-23 — End: 2021-02-07

## 2021-01-23 MED ORDER — TRAZODONE HCL 50 MG PO TABS: 25.0000 mg | ORAL_TABLET | Freq: Every evening | ORAL | Status: DC | PRN

## 2021-01-23 MED ORDER — PROCHLORPERAZINE 25 MG RE SUPP: 12.5000 mg | Freq: Four times a day (QID) | RECTAL | Status: DC | PRN

## 2021-01-23 MED ORDER — GUAIFENESIN-DM 100-10 MG/5ML PO SYRP: 5.0000 mL | ORAL_SOLUTION | Freq: Four times a day (QID) | ORAL | Status: DC | PRN

## 2021-01-23 MED ORDER — MELATONIN 3 MG PO TABS
3.0000 mg | ORAL_TABLET | Freq: Every day | ORAL | Status: DC
  Administered 2021-01-23 – 2021-02-06 (×15): 3 mg via ORAL
  Filled 2021-01-23 (×15): qty 1

## 2021-01-23 MED ORDER — CHLORHEXIDINE GLUCONATE CLOTH 2 % EX PADS
6.0000 | MEDICATED_PAD | Freq: Every day | CUTANEOUS | Status: DC
  Administered 2021-01-24: 6 via TOPICAL

## 2021-01-23 MED ORDER — METFORMIN HCL 500 MG PO TABS
1000.0000 mg | ORAL_TABLET | Freq: Two times a day (BID) | ORAL | Status: DC
  Administered 2021-01-23 – 2021-01-30 (×14): 1000 mg via ORAL
  Filled 2021-01-23 (×14): qty 2

## 2021-01-23 MED ORDER — HYDROCODONE-ACETAMINOPHEN 5-325 MG PO TABS
1.0000 | ORAL_TABLET | ORAL | Status: DC | PRN
  Administered 2021-01-24: 1 via ORAL
  Filled 2021-01-23 (×2): qty 1

## 2021-01-23 MED ORDER — SENNOSIDES-DOCUSATE SODIUM 8.6-50 MG PO TABS
2.0000 | ORAL_TABLET | Freq: Every day | ORAL | Status: DC
  Administered 2021-01-23 – 2021-02-06 (×14): 2 via ORAL
  Filled 2021-01-23 (×15): qty 2

## 2021-01-23 MED ORDER — ADULT MULTIVITAMIN W/MINERALS CH
1.0000 | ORAL_TABLET | Freq: Every day | ORAL | Status: DC
  Administered 2021-01-24 – 2021-02-07 (×15): 1 via ORAL
  Filled 2021-01-23 (×15): qty 1

## 2021-01-23 MED ORDER — PANTOPRAZOLE SODIUM 40 MG PO TBEC
40.0000 mg | DELAYED_RELEASE_TABLET | Freq: Every day | ORAL | Status: DC
  Administered 2021-01-23 – 2021-02-06 (×15): 40 mg via ORAL
  Filled 2021-01-23 (×16): qty 1

## 2021-01-23 MED ORDER — PIOGLITAZONE HCL 15 MG PO TABS
15.0000 mg | ORAL_TABLET | Freq: Every day | ORAL | Status: DC
  Administered 2021-01-24 – 2021-02-07 (×15): 15 mg via ORAL
  Filled 2021-01-23 (×15): qty 1

## 2021-01-23 MED ORDER — DEXAMETHASONE 1 MG PO TABS: 1.0000 mg | ORAL_TABLET | Freq: Every day | ORAL | 0 refills | Status: DC

## 2021-01-23 MED ORDER — INSULIN ASPART 100 UNIT/ML IJ SOLN
0.0000 [IU] | Freq: Three times a day (TID) | INTRAMUSCULAR | Status: DC
Start: 2021-01-23 — End: 2021-02-02
  Administered 2021-01-23 – 2021-01-25 (×4): 1 [IU] via SUBCUTANEOUS
  Administered 2021-01-26: 2 [IU] via SUBCUTANEOUS
  Administered 2021-01-28: 3 [IU] via SUBCUTANEOUS
  Administered 2021-01-28 – 2021-01-29 (×2): 1 [IU] via SUBCUTANEOUS
  Administered 2021-01-31: 2 [IU] via SUBCUTANEOUS
  Administered 2021-01-31 – 2021-02-02 (×3): 1 [IU] via SUBCUTANEOUS

## 2021-01-23 MED ORDER — METOPROLOL SUCCINATE ER 50 MG PO TB24
50.0000 mg | ORAL_TABLET | Freq: Every day | ORAL | Status: DC
  Administered 2021-01-24 – 2021-02-07 (×15): 50 mg via ORAL
  Filled 2021-01-23 (×15): qty 1

## 2021-01-23 MED ORDER — PROCHLORPERAZINE MALEATE 5 MG PO TABS: 5.0000 mg | ORAL_TABLET | Freq: Four times a day (QID) | ORAL | Status: DC | PRN

## 2021-01-23 MED ORDER — METHIMAZOLE 5 MG PO TABS
2.5000 mg | ORAL_TABLET | Freq: Every morning | ORAL | Status: DC
  Administered 2021-01-24 – 2021-02-07 (×15): 2.5 mg via ORAL
  Filled 2021-01-23 (×15): qty 1

## 2021-01-23 MED ORDER — BISACODYL 10 MG RE SUPP
10.0000 mg | Freq: Every day | RECTAL | Status: DC | PRN
  Filled 2021-01-23: qty 1

## 2021-01-23 MED ORDER — LISINOPRIL 20 MG PO TABS
40.0000 mg | ORAL_TABLET | Freq: Every day | ORAL | Status: DC
  Administered 2021-01-24 – 2021-02-07 (×15): 40 mg via ORAL
  Filled 2021-01-23 (×15): qty 2

## 2021-01-23 MED ORDER — DEXAMETHASONE 1 MG PO TABS: 1.0000 mg | ORAL_TABLET | Freq: Two times a day (BID) | ORAL | 0 refills | Status: DC

## 2021-01-23 MED ORDER — PROCHLORPERAZINE EDISYLATE 10 MG/2ML IJ SOLN: 5.0000 mg | Freq: Four times a day (QID) | INTRAMUSCULAR | Status: DC | PRN

## 2021-01-23 NOTE — TOC Transition Note (Signed)
Transition of Care [facility]) - CM/SW Discharge Note   Patient Details  Name: Ann Chaney MRN: [id number] Date of Birth: November 10, 1945  [facility] CM/SW Contact:  Pollie Friar, RN Phone Number: 01/23/2021, 10:28 AM   Clinical Narrative:    Patient is discharging to CIR today. CM signing off.    Final next level of care: IP Rehab Facility Barriers to Discharge: No Barriers Identified   Patient Goals and CMS Choice     Choice offered to / list presented to : Patient  Discharge Placement                       Discharge Plan and Services                                     Social Determinants of Health (SDOH) Interventions     Readmission Risk Interventions No flowsheet data found.

## 2021-01-23 NOTE — Progress Notes (Signed)
Inpatient Rehabilitation Admission Medication Review by a Pharmacist  A complete drug regimen review was completed for this patient to identify any potential clinically significant medication issues.  High Risk Drug Classes Is patient taking? Indication by Medication  Antipsychotic Yes Compazine for N/V  Anticoagulant No   Antibiotic No   Opioid Yes Vicodin for pain  Antiplatelet No   Hypoglycemics/insulin Yes Glipizide, Actos, metformin for DM  Vasoactive Medication Yes Lisinopril, Toprol for BP  Chemotherapy No   Other Yes Keppra for seizure ppx     Type of Medication Issue Identified Description of Issue Recommendation(s)  Drug Interaction(s) (clinically significant)     Duplicate Therapy     Allergy     No Medication Administration End Date     Incorrect Dose     Additional Drug Therapy Needed     Significant med changes from prior encounter (inform family/care partners about these prior to discharge).    Other       Clinically significant medication issues were identified that warrant physician communication and completion of prescribed/recommended actions by midnight of the next day:  No  Pharmacist comments: Follow up [place]  Time spent performing this drug regimen review (minutes):  20 minutes   Tad Moore 01/23/2021 1:45 PM

## 2021-01-23 NOTE — Discharge Summary (Signed)
Physician Discharge Summary     Providing Compassionate, Quality Care - Together   Patient ID: Ann Chaney MRN: [id number] DOB/AGE: January 18, 1946 75 y.o.  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 Annette Stable on 01/14/2021. She was admitted to the Neuro ICU following recovery from anesthesia in the PACU. Her postoperative course was complicated by altered mental status and right-sided weakness. She has worked with both physical and occupational therapies, who feel the patient would benefit form continued therapies in the inpatient setting. She is ambulating with moderate assistance at this time. She is tolerating a normal diet. She denies pain. She is ready for discharge to [place].   Consults: neurology and rehabilitation medicine  Significant Diagnostic Studies: radiology:   CT HEAD WO CONTRAST (5MM)  Result Date: 01/14/2021 CLINICAL DATA:  Altered mental status EXAM: CT HEAD WITHOUT CONTRAST TECHNIQUE: Contiguous axial images were obtained from the base of the skull through the vertex without intravenous contrast. COMPARISON:  Brain MRI 12/21/2020 FINDINGS: Brain: There are new postsurgical changes reflecting bifrontal craniotomy for extra-axial mass resection. There is postoperative fluid, pneumocephalus, and bladder blood products overlying the left frontal lobe of the related to the recent operation. There is mild edema in the left frontal lobe parenchyma. The extra-axial gas mildly inferiorly displaces the superior sagittal sinus anteriorly. There is approximately 3 mm rightward midline shift, decreased compared to the preoperative study. There is no evidence of acute territorial infarct. There is a background of mild parenchymal volume  loss. Hypodensity in the remainder of the subcortical and periventricular white matter likely reflects sequela of chronic white matter microangiopathy. Vascular: There is calcification of the bilateral cavernous ICAs. Skull: As above, there are postsurgical changes reflecting bifrontal craniotomy. Sinuses/Orbits: The imaged paranasal sinuses are clear. The globes and orbits are unremarkable. Other: None. IMPRESSION: 1. Expected postsurgical changes reflecting recent bifrontal craniotomy for extra-axial mass resection, with postsurgical fluid, small amount of blood, and air overlying the left frontal lobe. 2. Edema in the left frontal lobe remains present with 3 mm rightward midline shift, decreased compared to the preoperative study. Electronically Signed   By: Valetta Mole M.D.   On: 01/14/2021 13:18   MR BRAIN W WO CONTRAST  Result Date: 01/15/2021 CLINICAL DATA:  Postop check after mass resection. EXAM: MRI HEAD WITHOUT AND WITH CONTRAST TECHNIQUE: Multiplanar, multiecho pulse sequences of the brain and surrounding structures were obtained without and with intravenous contrast. CONTRAST:  4.30mL GADAVIST GADOBUTROL 1 MMOL/ML IV SOLN COMPARISON:  12/21/2020 FINDINGS: Brain: Resected left frontal mass with fluid and blood containing cavity. No nodular residual is seen. Rim of restricted diffusion along the inferior and posterior aspect of the cavity. Small cortical and subcortical infarcts in the bifrontal to right occipital region. No hydrocephalus or shift. Pre-existing vasogenic edema on the left without increase. Small volume fluid beneath the bone flap, unremarkable. Remote perforator infarct at the right basal ganglia Vascular: Major flow voids are preserved, including the superior sagittal sinus at the level of crossing craniotomy Skull and upper cervical spine: Unremarkable left frontal craniotomy Sinuses/Orbits: Negative Other: Partially covered goiter. IMPRESSION: 1. Mass resection without evidence of  residual. 2. Expected blood products in the operative defect with rim of cortical infarct posteriorly and inferiorly. 3. Small scattered infarcts in the bilateral cerebrum. Electronically Signed  By: Jorje Guild M.D.   On: 01/15/2021 09:23   MR BRAIN W WO CONTRAST  Result Date: 12/21/2020 CLINICAL DATA:  Dementia without behavioral disturbance, unspecified dementia type. EXAM: MRI HEAD WITHOUT AND WITH CONTRAST TECHNIQUE: Multiplanar, multiecho pulse sequences of the brain and surrounding structures were obtained without and with intravenous contrast. Additionally, using NeuroQuant software a 3D volumetric analysis of the brain was performed and is compared to a normative database adjusted for age, gender and intracranial volume. CONTRAST:  53mL GADAVIST GADOBUTROL 1 MMOL/ML IV SOLN COMPARISON:  No pertinent prior exams available for comparison. FINDINGS: Brain: Subjectively, there is mild generalized cerebral and cerebellar atrophy. Large enhancing extra-axial dural-based mass along the left aspect of the mid-to-anterior falx, measuring 6.2 x 3.3 x 4.8 cm (for instance as seen on series 12, image 20) (series 13, image 21). There are irregular foci of SWI signal loss within the mass, likely reflecting a combination of mineralization and flow voids. The imaging features are most compatible with meningioma. Significant mass effect upon the underlying left frontal lobe with partial effacement of the left greater than right lateral ventricles, and 6 mm rightward midline shift measured at the level of the septum pellucidum. Severe surrounding vasogenic edema within the left cerebral hemisphere. A flow void is maintained within the superior sagittal sinus, and there is no evidence of superior sagittal sinus invasion at this time. Background moderate multifocal T2 FLAIR hyperintense signal abnormality within the cerebral white matter, nonspecific but compatible with chronic small-vessel ischemic disease. Chronic  small-vessel infarct within the right basal ganglia/anterior limb of right internal capsule. There is no acute infarct. No extra-axial fluid collection. Vascular: Maintained flow voids within the proximal large arterial vessels. Skull and upper cervical spine: No focal suspicious marrow lesion. Trace C3-C4 grade 1 anterolisthesis. Sinuses/Orbits: Visualized orbits show no acute finding. Minimal bilateral ethmoid sinus mucosal thickening. Other: Trace bilateral mastoid effusions. NeuroQuant Findings: Volumetric analysis of the brain was performed, with a fully detailed report in [facility]. Briefly, the comparison with age and gender matched reference reveals a whole brain volume normative percentile of 99. However, the presence of a large left frontal mass limits reliability of this data. Impression #1 called by telephone at the time of interpretation on 12/21/2020 at approximately 12:40 am to provider [facility] , who verbally acknowledged these results. Dr. Manuella Ghazi contact at the imaging center directly to provided the patient further direction. IMPRESSION: 6.2 x 3.3 x 4.8 cm dural-based mass along the left aspect of the mid-to-anterior falx, as described and likely reflecting a large meningioma. Significant mass effect upon the underlying left frontal lobe with partial effacement of the left greater than right lateral ventricles, and 6 mm rightward midline shift measured at the level of the septum pellucidum. Severe surrounding vasogenic edema within the left cerebral hemisphere. No evidence of superior sagittal sinus invasion at this time. Background moderate chronic small vessel ischemic changes within the cerebral white matter. Chronic small-vessel infarct within the anterior limb of right internal capsule/right basal ganglia. NeuroQuant volumetric analysis of the brain, see details on [employer]. Briefly, the comparison with age and gender matched reference reveals a whole brain volume normative percentile of  99. However, the presence of a large left frontal mass limits reliability of this data. Subjectively, there is mild for age generalized cerebral and cerebellar atrophy. Electronically Signed   By: Kellie Simmering D.O.   On: 12/21/2020 12:56   EEG adult  Result Date: 01/15/2021 Lora Havens, MD  01/15/2021  2:48 PM Patient Name: Ann Chaney MRN: [id number] Epilepsy Attending: Lora Havens Referring Physician/Provider: Rowan Blase, NP Date: 01/15/2021 Duration: 21.41 mins Patient history: 75 year old female with bifrontal craniotomy and resection of parasagittal meningioma, continued to be altered.  EEG to evaluate for seizures. Level of alertness:  lethargic AEDs during EEG study: LEV Technical aspects: This EEG study was done with scalp electrodes positioned according to the 10-20 International system of electrode placement. Electrical activity was acquired at a sampling rate of 500Hz  and reviewed with a high frequency filter of 70Hz  and a low frequency filter of 1Hz . EEG data were recorded continuously and digitally stored. Description: No clear posterior dominant rhythm was seen.  EEG showed continuous generalized polymorphic sharply contoured mixed frequencies with predominantly 3 to 6 Hz theta-delta slowing admixed with 8 to 10 Hz intermittent generalized alpha activity.  Sharp transients were noted in left posterior quadrant.  Hyperventilation and photic stimulation were not performed.   ABNORMALITY - Continuous slow, generalized IMPRESSION: This study is suggestive of moderate diffuse encephalopathy, nonspecific to etiology.  No seizures or definite epileptiform discharges were seen throughout the recording. Priyanka Barbra Sarks   Overnight EEG with video  Result Date: 01/16/2021 Lora Havens, MD     01/16/2021 10:46 AM Patient Name: Ann Chaney MRN: [id number] Epilepsy Attending: Lora Havens Referring Physician/Provider: Rowan Blase, NP Duration: 01/15/2021 1139 to  01/16/2021 1029  Patient history: 75 year old female with bifrontal craniotomy and resection of parasagittal meningioma, continued to be altered.  EEG to evaluate for seizures.  Level of alertness: lethargic  AEDs during EEG study: LEV  Technical aspects: This EEG study was done with scalp electrodes positioned according to the 10-20 International system of electrode placement. Electrical activity was acquired at a sampling rate of 500Hz  and reviewed with a high frequency filter of 70Hz  and a low frequency filter of 1Hz . EEG data were recorded continuously and digitally stored.  Description: No clear posterior dominant rhythm was seen. EEG showed continuous generalized polymorphic sharply contoured mixed frequencies with predominantly 3 to 6 Hz theta-delta slowing admixed with 8 to 10 Hz intermittent generalized alpha activity.  Sharp transients were noted in left posterior quadrant, predominantly when awake/stimulated. Hyperventilation and photic stimulation were not performed.    ABNORMALITY - Continuous slow, generalized  IMPRESSION: This study is suggestive of moderate diffuse encephalopathy, nonspecific to etiology.  No seizures or definite epileptiform discharges were seen throughout the recording.  Priyanka Barbra Sarks      Treatments: surgery: Bifrontal craniotomy with resection of left parasagittal extra-axial tumor, microdissection  Discharge Exam: Blood pressure (!) 108/54, pulse (!) 59, temperature 97.8 F (36.6 C), temperature source Oral, resp. rate 18, height 5\' 5"  (1.651 m), weight 57 kg, SpO2 97 %.  Alert and oriented x 4 PERRLA Speech clear CN II-XII grossly intact MAE, Strength and sensation intact LUE; 4/5 RUE, RLE Incision is open to air and is clean, dry, and intact   Disposition: Discharge disposition: [place] Not Defined        Allergies as of 01/23/2021       Reactions   Atorvastatin Other (See Comments)   Myalgias   Pravastatin Sodium  Nausea Only        Medication List     TAKE these medications    Accu-Chek Aviva Plus test strip Generic drug: glucose blood 1 each daily.   Accu-Chek Softclix Lancets lancets   acetaminophen 500 MG tablet Commonly known as: TYLENOL Take  500 mg by mouth every 6 (six) hours as needed (pain).   alendronate 70 MG tablet Commonly known as: FOSAMAX Take 1 tablet (70 mg total) by mouth every 7 (seven) days. Take with a full glass of water on an empty stomach. What changed: when to take this   dexamethasone 1 MG tablet Commonly known as: DECADRON Take 1 tablet (1 mg total) by mouth 2 (two) times daily with a meal for 5 days. What changed:  medication strength how much to take when to take this   dexamethasone 1 MG tablet Commonly known as: DECADRON Take 1 tablet (1 mg total) by mouth daily with breakfast for 5 days. Start taking on: January 29, 2021 What changed: You were already taking a medication with the same name, and this prescription was added. Make sure you understand how and when to take each.   glipiZIDE 5 MG tablet Commonly known as: GLUCOTROL Take 1 tablet (5 mg total) by mouth 2 (two) times daily before a meal. What changed: when to take this   latanoprost 0.005 % ophthalmic solution Commonly known as: XALATAN Place 1 drop into both eyes at bedtime.   levETIRAcetam 500 MG tablet Commonly known as: KEPPRA Take 1 tablet (500 mg total) by mouth 2 (two) times daily.   lisinopril 40 MG tablet Commonly known as: ZESTRIL Take 1 tablet (40 mg total) by mouth daily.   metFORMIN 1000 MG tablet Commonly known as: GLUCOPHAGE Take 1 tablet (1,000 mg total) by mouth 2 (two) times daily.   methimazole 5 MG tablet Commonly known as: TAPAZOLE Take 2.5 mg by mouth in the morning.   metoprolol succinate 50 MG 24 hr tablet Commonly known as: TOPROL-XL Take 1 tablet (50 mg total) by mouth daily. Take with or immediately following a meal.   multivitamin with  minerals Tabs tablet Take 1 tablet by mouth daily.   pioglitazone 15 MG tablet Commonly known as: ACTOS Take 1 tablet (15 mg total) by mouth daily.        Follow-up Information     Earnie Larsson, MD. Schedule an appointment as soon as possible for a visit in 2 week(s).   Specialty: Neurosurgery Contact information: [address] [place] [address] [phone]                 Signed: Viona Gilmore, DNP, AGNP-C Nurse Practitioner  [facility] [place] [address], [place], [place] [address] P: [phone]    F: [phone]  01/23/2021, 11:23 AM

## 2021-01-23 NOTE — H&P (Signed)
Physical Medicine and Rehabilitation Admission H&P  CC: Functional deficits post meningioma resection.   HPI: Ann Chaney is a 75 year old RH female with history of T2DM, HTN, hyperthyroidism, cognitive decline, HA and confusion for the past 5 - 6  months who was evaluated by Dr. Shah/neurology and work up done revealing 6.2 x 3.3 X 4.8 cm large left frontal meningioma with partial effacement of left>right ventricle and 6 mm mass effect. She was started on decadron and admitted on 01/14/21 for bifrontal craniotomy with resection of left parasagittal extra-axial tumor by Dr. Trenton Gammon.  Postop had right-sided weakness with decrease in verbal output and follow-up MRI brain done revealing mass resection without evidence of residual, small cortical and subcortical infarcts and bifrontal to the right occipital region and small volume fluid beneath bone flap.    EEG done due to concerns of postictal state and showed moderate diffuse encephalopathy without seizures.  Mentation and verbal output has improved however she continues to have a mild right hemiparesis.  Keppra added 10/28 for seizure prophylaxis?Marland Kitchen  To start decadron taper today. Therapy has been ongoing and patient noted to have delayed in processing to follow one-step commands, shuffling gait with tendency to drag right foot, difficulty utilizing LUE for use of walker and difficulty with problem-solving.  CIR was recommended due to functional decline. Currently having difficulty sleeping at night.    Review of Systems  Constitutional:  Negative for chills and fever.  HENT:  Negative for hearing loss.   Eyes:  Negative for blurred vision and pain.  Respiratory:  Negative for cough.   Cardiovascular:  Negative for chest pain.  Gastrointestinal:  Negative for abdominal pain and heartburn.  Genitourinary:  Positive for frequency (gets up multiple times during the  night). Negative for urgency.  Musculoskeletal:  Positive for joint pain.  Negative for myalgias.  Skin:  Negative for rash.  Neurological:  Negative for dizziness, weakness and headaches.  Psychiatric/Behavioral:  Positive for memory loss. The patient has insomnia.     Past Medical History:  Diagnosis Date   Diabetes mellitus without complication ([place])    Glaucoma    Glaucoma    Hyperlipidemia    Hypertension     Past Surgical History:  Procedure Laterality Date   ABDOMINAL HYSTERECTOMY     CRANIOTOMY Left 01/14/2021   Procedure: Craniotomy - left - Frontal - Tumor;  Surgeon: Earnie Larsson, MD;  Location: [facility];  Service: Neurosurgery;  Laterality: Left;   HYSTEROTOMY      Family History  Problem Relation Age of Onset   Cancer Mother        sternum   Emphysema Mother    Diabetes Mother    Glaucoma Father    Dementia Father    Breast cancer Maternal Aunt     Social History: Lived alone and was independent PTA. Daughter plans to move in and assist after discharge. 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 She retired 2 years ago. She reports that she quit smoking about 5 months ago. Her smoking use included cigarettes. She smoked an average of .5 packs per day. She has never used smokeless tobacco. She reports that she does not drink alcohol and does not use drugs.   Allergies  Allergen Reactions   Atorvastatin Other (See Comments)    Myalgias   Pravastatin Sodium Nausea Only    Medications Prior to Admission  Medication Sig Dispense Refill  acetaminophen (TYLENOL) 500 MG tablet Take 500 mg by mouth every 6 (six) hours as needed (pain).     alendronate (FOSAMAX) 70 MG tablet Take 1 tablet (70 mg total) by mouth every 7 (seven) days. Take with a full glass of water on an empty stomach. (Patient taking differently: Take 70 mg by mouth every Friday. Take with a full glass of water on an empty stomach.) 4 tablet 5   glipiZIDE (GLUCOTROL) 5 MG tablet Take 1 tablet (5 mg total) by mouth 2 (two) times daily before a meal.  (Patient taking differently: Take 5 mg by mouth in the morning.) 180 tablet 1   latanoprost (XALATAN) 0.005 % ophthalmic solution Place 1 drop into both eyes at bedtime.     lisinopril (ZESTRIL) 40 MG tablet Take 1 tablet (40 mg total) by mouth daily. 90 tablet 1   metFORMIN (GLUCOPHAGE) 1000 MG tablet Take 1 tablet (1,000 mg total) by mouth 2 (two) times daily. 180 tablet 1   methimazole (TAPAZOLE) 5 MG tablet Take 2.5 mg by mouth in the morning.     metoprolol succinate (TOPROL-XL) 50 MG 24 hr tablet Take 1 tablet (50 mg total) by mouth daily. Take with or immediately following a meal. 180 tablet 1   Multiple Vitamin (MULTIVITAMIN WITH MINERALS) TABS tablet Take 1 tablet by mouth daily.     pioglitazone (ACTOS) 15 MG tablet Take 1 tablet (15 mg total) by mouth daily. 90 tablet 1   ACCU-CHEK AVIVA PLUS test strip 1 each daily.     Accu-Chek Softclix Lancets lancets      dexamethasone (DECADRON) 2 MG tablet Take 1 tablet (2 mg total) by mouth 2 (two) times daily. 60 tablet 0    Drug Regimen Review  Drug regimen was reviewed and remains appropriate with no significant issues identified    Home: Home Living Family/patient expects to be discharged to:: Private residence Living Arrangements: Alone Available Help at Discharge: Family, Available 24 hours/day (daughter, [name], moving in with her) Type of Home: House Home Access: Level entry Home Layout: One level Bathroom Shower/Tub: Chiropodist: Standard Bathroom Accessibility: Yes Home Equipment: None Additional Comments: Dog names [place]  Lives With: Alone   Functional History: Prior Function Prior Level of Function : Independent/Modified Independent Mobility Comments: no DME ADLs Comments: daughter moving in with patient at discharge to provide 24/7 supervision   Functional Status:  Mobility: Bed Mobility Overal bed mobility: Needs Assistance Bed Mobility: Supine to Sit Rolling: Mod assist Sidelying to sit:  Min assist Supine to sit: Min assist Sit to supine: Mod assist General bed mobility comments: cues for self-assist, HOB elevated to >40 degrees prior to transfer, good use of bed rail to get to EOB but needs heavy cues for contralateral hip scooting to foot flat. Transfers Overall transfer level: Needs assistance Equipment used: 1 person hand held assist, Rolling walker (2 wheels) Transfers: Sit to/from Stand Sit to Stand: Min guard, Min assist Stand pivot transfers: +2 physical assistance, Mod assist General transfer comment: minA for initial sit>stand to RW and stand>sit to toilet, then min guard from [facility] for sit>stand this session and to chair for stand>sit Ambulation/Gait Ambulation/Gait assistance: Min assist Gait Distance (Feet): 10 Feet (90ft to BSC, then 16ft to chair) Assistive device: Rolling walker (2 wheels) Gait Pattern/deviations: Decreased stride length, Wide base of support, Decreased dorsiflexion - right, Step-through pattern, Decreased step length - right General Gait Details: multimodal cues for improved RLE step length/height, pt incontinent of bladder  while walking around end of bed and needed BSC brought to her (but had already emptied bladder) so sat down before final walk to get cleaned up/new socks and gown. Gait velocity: decreased Gait velocity interpretation: <1.31 ft/sec, indicative of household ambulator   ADL: ADL Overall ADL's : Needs assistance/impaired Eating/Feeding: Moderate assistance, Sitting Eating/Feeding Details (indicate cue type and reason): provided a rolled wash cloth to increase grasp for R hand. pt unaware when cup fell out of hand. pt holding straw in her mouth but unaware it is not in the cup. Grooming: Wash/dry hands, Minimal assistance Upper Body Bathing: Moderate assistance Lower Body Bathing: Minimal assistance, Sit to/from stand Lower Body Bathing Details (indicate cue type and reason): min A for verbal cues and balance in standing to  bathe peri area Upper Body Dressing : Moderate assistance Lower Body Dressing: Minimal assistance, Sit to/from stand Lower Body Dressing Details (indicate cue type and reason): pt doffed with min A, doffed bilat socks and donned dry pai wtih min A for RLE Toilet Transfer: Minimal assistance, Ambulation Toilet Transfer Details (indicate cue type and reason): simulated from chair >bed, pt required min A for short ambulation Toileting- Clothing Manipulation and Hygiene: Minimal assistance, Sit to/from stand Toileting - Clothing Manipulation Details (indicate cue type and reason): incontinent bladder this session upon standing. Min A overall fro clean up Functional mobility during ADLs: Minimal assistance General ADL Comments: pt with incontinent bladder during functional ambulation; session then spent on ADLs for clean up wtih several sit<>stands with 1 person HH assist   Cognition: Cognition Overall Cognitive Status: Impaired/Different from baseline Orientation Level: Disoriented to time Cognition Arousal/Alertness: Awake/alert Behavior During Therapy: WFL for tasks assessed/performed Overall Cognitive Status: Impaired/Different from baseline Area of Impairment: Memory, Following commands, Safety/judgement, Awareness, Problem solving Orientation Level: Disoriented to, Time Current Attention Level: Sustained Memory: Decreased short-term memory Following Commands: Follows one step commands with increased time Safety/Judgement: Decreased awareness of safety, Decreased awareness of deficits Awareness: Intellectual Problem Solving: Slow processing, Decreased initiation, Requires verbal cues, Requires tactile cues General Comments: Pt follows most functional commands, answers some simple yes/no questions with increased time to respond, often needs repetition of question prior to response. Pt very incontinent of bladder while walking a few feet around bed but when asked by therapist just prior to  this if she needed to go to bathroom, pt stated no.     There were no vitals taken for this visit. Physical Exam Gen: no distress, normal appearing HEENT: oral mucosa pink and moist, NCAT Cardio: Bradycardic Chest: normal effort, normal rate of breathing Abd: soft, non-distended Ext: no edema Psych: pleasant, normal affect, smiling Skin: crani incision with some crusted blood, c/d/i Neurological:     Mental Status: She is alert and oriented x3. Able to state name, date, and where she is. Extremely delayed in other responses. Unable to recall age or DOB. Internally distracted and needed constant cues to attend to tasks. 5/5 strength on left, and 4/5 on right. GU: incontinent of bladder  Results for orders placed or performed during the hospital encounter of 01/14/21 (from the past 48 hour(s))  Glucose, capillary     Status: Abnormal   Collection Time: 01/21/21  4:18 PM  Result Value Ref Range   Glucose-Capillary 102 (H) 70 - 99 mg/dL    Comment: Glucose reference range applies only to samples taken after fasting for at least 8 hours.   Comment 1 Notify RN    Comment 2 Document in Chart  Glucose, capillary     Status: Abnormal   Collection Time: 01/21/21 10:25 PM  Result Value Ref Range   Glucose-Capillary 169 (H) 70 - 99 mg/dL    Comment: Glucose reference range applies only to samples taken after fasting for at least 8 hours.  Glucose, capillary     Status: Abnormal   Collection Time: 01/22/21  6:07 AM  Result Value Ref Range   Glucose-Capillary 146 (H) 70 - 99 mg/dL    Comment: Glucose reference range applies only to samples taken after fasting for at least 8 hours.   Comment 1 Notify RN    Comment 2 Document in Chart   Glucose, capillary     Status: None   Collection Time: 01/22/21  1:05 PM  Result Value Ref Range   Glucose-Capillary 95 70 - 99 mg/dL    Comment: Glucose reference range applies only to samples taken after fasting for at least 8 hours.  Glucose, capillary      Status: Abnormal   Collection Time: 01/22/21  4:09 PM  Result Value Ref Range   Glucose-Capillary 118 (H) 70 - 99 mg/dL    Comment: Glucose reference range applies only to samples taken after fasting for at least 8 hours.   Comment 1 Notify RN    Comment 2 Document in Chart   Glucose, capillary     Status: Abnormal   Collection Time: 01/22/21  9:20 PM  Result Value Ref Range   Glucose-Capillary 110 (H) 70 - 99 mg/dL    Comment: Glucose reference range applies only to samples taken after fasting for at least 8 hours.  Glucose, capillary     Status: Abnormal   Collection Time: 01/23/21  6:12 AM  Result Value Ref Range   Glucose-Capillary 137 (H) 70 - 99 mg/dL    Comment: Glucose reference range applies only to samples taken after fasting for at least 8 hours.  Glucose, capillary     Status: None   Collection Time: 01/23/21 11:50 AM  Result Value Ref Range   Glucose-Capillary 80 70 - 99 mg/dL    Comment: Glucose reference range applies only to samples taken after fasting for at least 8 hours.   Comment 1 Notify RN    Comment 2 Document in Chart    No results found.     Medical Problem List and Plan: 1.  Impaired mobility following large parasagittal meningioma resection  -patient may shower but incision must be covered  -ELOS/Goals: 10-14 days S 2.  Antithrombotics: -DVT/anticoagulation:  Mechanical: Sequential compression devices, below knee Bilateral lower extremities  -antiplatelet therapy: N/a 3. Pain Management: Continue hydrocodone prn.  4. Mood: LCSW to follow for evaluation and support.   -antipsychotic agents: N/A 5. Neuropsych: This patient is not fully capable of making decisions on her own behalf. 6. Skin/Wound Care: Routine pressure relief measures. Monitor incision for healing.  7. Fluids/Electrolytes/Nutrition: Monitor I/O.   --Check CMET in am.  8. HTN: Monitor BP TID- Continue Lisinopril and Toprol.   --BP/HR on low side. Monitor for symptoms.  9. T2DM:  Hgb A1C-6.2 and well controlled on Metformin, Actos and Glipizide  --Monitor BS ac/hs and use SSI for elevated BS.   --Anticipate BS to improve as decadron tapered off 10. Goiter/Hyperthyroidism: Continue Methimazole.  11. Seizure pophylaxis: Has been seizure free >7 days since surgery. D/c Keppra.   --has been on decadron since 12/21/20-->will start slow taper to decadron 1 mg bid. 12. Constipation: Will add Senna S at bedtime.  13. Hyponatremia: Na down to 132 on 10/25, from 137 12 days ago-->recheck in am.  14. Leucocytosis: WBC up to 18.7 on 10/25, up from 9.1 12 days ago--> steroid effect v/s infection. --monitor for fevers and other signs of infection 15. Acute blood loss anemia: Hgb 11.8 on 11.2, down from 13.1 12 days prior. Likely post op and will check follow up CBC in am.  16. Open angle glaucoma:  Stable on Xalatan.  17. Insomnia:  Due to nocturia? Will add low dose melatonin.   I have personally performed a face to face diagnostic evaluation, including, but not limited to relevant history and physical exam findings, of this patient and developed relevant assessment and plan.  Additionally, I have reviewed and concur with the physician assistant's documentation above.  Izora Ribas, MD 01/23/2021   Ann Leriche, PA-C

## 2021-01-23 NOTE — Progress Notes (Signed)
Physical Therapy Treatment Patient Details Name: Ann Chaney MRN: [id number] DOB: 07/25/1945 Today's Date: 01/23/2021   History of Present Illness 75 y/o female s/p bifrontal craniotomy for L frontal parasagittal extra-axial meningioma resection on 10/24. Following procedure, patient with decreased arousal and R sided weakness. Post-op MRI revealed mass resection without evidence of residual and small scattered infarcts in bilateral cerebrum. PMH: DM type 2, glaucoma, HLD, HTN.    PT Comments    Pt received in supine, agreeable to therapy session and with good participation and tolerance for gait task in room and transfer training. Emphasis on safe hand placement with transfers, proper use of RW, improved step length/height especially on R side and importance of mobility throughout the day. Difficult to assess pt RPE level as she reports "very tired" after short distance gait task but when initially asked if she felt tired, she states "no" multiple times. Delayed processing and some difficulty with communication d/t aphasia. Pt continues to benefit from PT services to progress toward functional mobility goals.    Recommendations for follow up therapy are one component of a multi-disciplinary discharge planning process, led by the attending physician.  Recommendations may be updated based on patient status, additional functional criteria and insurance authorization.  Follow Up Recommendations  Acute inpatient rehab (3hours/day)     Assistance Recommended at Discharge    Equipment Recommendations  Other (comment) (TBA post-acute)    Recommendations for Other Services       Precautions / Restrictions Precautions Precautions: Fall Precaution Comments: R hemiparesis, aphasia Restrictions Weight Bearing Restrictions: No     Mobility  Bed Mobility Overal bed mobility: Needs Assistance Bed Mobility: Supine to Sit     Supine to sit: Min assist     General bed mobility comments:  cues for self-assist, HOB flat prior to transfer, needs bed rail support to achieve    Transfers Overall transfer level: Needs assistance Equipment used: Rolling walker (2 wheels) Transfers: Sit to/from Stand Sit to Stand: Min guard           General transfer comment: min guard from EOB<>RW and BSC    Ambulation/Gait Ambulation/Gait assistance: Min assist Gait Distance (Feet): 35 Feet (59ft to BSC, then 31ft) Assistive device: Rolling walker (2 wheels) Gait Pattern/deviations: Decreased stride length;Wide base of support;Decreased dorsiflexion - right;Step-through pattern;Decreased step length - right Gait velocity: decreased   General Gait Details: multimodal cues for improved RLE step length/height, pt incontinent of bladder while walking around end of bed and needed BSC brought to her (but had already emptied bladder) so sat down before final walk to get cleaned up/new socks and gown.   Stairs             Wheelchair Mobility    Modified Rankin (Stroke Patients Only) Modified Rankin (Stroke Patients Only) Pre-Morbid Rankin Score: No significant disability Modified Rankin: Moderately severe disability     Balance Overall balance assessment: Needs assistance Sitting-balance support: Feet supported Sitting balance-Leahy Scale: Fair     Standing balance support: Single extremity supported;During functional activity Standing balance-Leahy Scale: Fair Standing balance comment: min guard to minA, increased assist with turning and for safety with stepping around obstacles                            Cognition Arousal/Alertness: Awake/alert Behavior During Therapy: WFL for tasks assessed/performed Overall Cognitive Status: Impaired/Different from baseline Area of Impairment: Memory;Following commands;Safety/judgement;Awareness;Problem solving  Current Attention Level: Sustained Memory: Decreased short-term memory Following  Commands: Follows one step commands with increased time Safety/Judgement: Decreased awareness of safety;Decreased awareness of deficits Awareness: Intellectual Problem Solving: Slow processing;Decreased initiation;Requires verbal cues;Requires tactile cues General Comments: Pt follows most functional commands, answers some simple yes/no questions with increased time to respond, often needs repetition of question prior to response.           General Comments General comments (skin integrity, edema, etc.): small area of skin breakdown between buttocks, RN notified      Pertinent Vitals/Pain Pain Assessment: Faces Faces Pain Scale: Hurts a little bit Breathing: normal Negative Vocalization: none Facial Expression: sad, frightened, frown Body Language: relaxed Consolability: no need to console PAINAD Score: 1 Pain Location: pt briefly frowing and guarding at L hip when standing from [facility] [place], then resolves quickly and denies pain when prompted x3 later in session Pain Descriptors / Indicators: Grimacing;Guarding Pain Intervention(s): Monitored during session;Repositioned     PT Goals (current goals can now be found in the care plan section) Acute Rehab PT Goals Patient Stated Goal: did not state PT Goal Formulation: With patient Time For Goal Achievement: 01/31/21 Progress towards PT goals: Progressing toward goals    Frequency    Min 4X/week      PT Plan Current plan remains appropriate       AM-PAC PT "6 Clicks" Mobility   Outcome Measure  Help needed turning from your back to your side while in a flat bed without using bedrails?: A Little Help needed moving from lying on your back to sitting on the side of a flat bed without using bedrails?: A Little Help needed moving to and from a bed to a chair (including a wheelchair)?: A Little Help needed standing up from a chair using your arms (e.g., wheelchair or bedside chair)?: A Little Help needed to walk in hospital room?:  A Little Help needed climbing 3-5 steps with a railing? : Total 6 Click Score: 16    End of Session Equipment Utilized During Treatment: Gait belt Activity Tolerance: Patient tolerated treatment well Patient left: with call bell/phone within reach;in bed;with bed alarm set;Other (comment);with SCD's reapplied (bed in chair position) Nurse Communication: Mobility status;Other (comment) (skin breakdown in peri-area) PT Visit Diagnosis: Other abnormalities of gait and mobility (R26.89);Hemiplegia and hemiparesis Hemiplegia - Right/Left: Right Hemiplegia - dominant/non-dominant: Dominant Hemiplegia - caused by: Unspecified (meningioma)     Time: 6440-3474 PT Time Calculation (min) (ACUTE ONLY): 21 min  Charges:  $Gait Training: 8-22 mins                     Tressa Maldonado P., PTA Acute Rehabilitation Services Pager: [phone] Office: [place] 01/23/2021, 11:28 AM

## 2021-01-23 NOTE — Progress Notes (Addendum)
Inpatient Rehabilitation Admissions Coordinator   Inpatient acute rehab/CIR bed is available today. I met with patient at bedside and left a message for her daughter to call me. I have alerted acute team and TOC and will make the arrangements to admit today.  Danne Baxter, RN, MSN Rehab Admissions Coordinator [phone] 01/23/2021 10:00 AM  Daughter returned phone call and is in agreement to CIR admit today.

## 2021-01-23 NOTE — Progress Notes (Signed)
Izora Ribas, MD   Physician  Physical Medicine and Rehabilitation  PMR Pre-admission     Signed  Date of Service:  01/18/2021  2:58 PM       Related encounter: Admission (Discharged) from 01/14/2021 in [facility]       Signed      Show:Clear all $RemoveBefore'[x]'bSYjmrXGWGfdZ$ Written$R'[x]'uF$ Templat'[]'$ Copied  Added by: $RemoveB'[x]'BBPCZamj$ Cristina Gong, RN$RemoveBeforeDE'[x]'ddrLDZKzEnUaRNV$ Ranell Patrick Clide Deutscher, MD  $R'[]'vk$ [place] for details                                                                                                                                                                                                                                                                                                                                                                                                                                     PMR Admission Coordinator Pre-Admission Assessment   Patient: Ann Chaney is an 75 y.o., female MRN: [id number] DOB: [birth year] Height: $RemoveBeforeDE'5\' 5"'iFfxXYXhaccZnvR$  (165.1 cm) Weight: 57 kg   Insurance Information HMO: yes    PPO:      PCP:      IPA:      80/20:      OTHER:  PRIMARY: [employer] Medicare      Policy#: [id number]      Subscriber: pt CM Name: Frances Furbish      Phone#: [phone] option # 7     Fax#: [phone] Pre-Cert#: [id number] approved  for 7 days     Benefits:  Phone #: [phone]     Name: 10/28 Eff. Date: 03/24/2020     Deduct: none      Out of Pocket Max: $4500      Life Max: none CIR: $325 co pay per day days 1 until 5      SNF: no copay days 1 until 20; $188 co pay per day days 21 until 44; no copay days 45 until 100 Outpatient: $30 per visit     Co-Pay: visits per medical neccesity Home Health: 100%      Co-Pay: visits per medical neccesity DME: 80%     Co-Pay: 20% Providers: in network  SECONDARY: none         Financial Counselor:        Phone#:    [employer]" for patients in Inpatient Rehabilitation Facilities with attached "Privacy Act [place] Records" was provided and verbally reviewed with: Family   Emergency Contact Information Contact Information       Name Relation Home Work Mobile    [place] Daughter [phone]        Londell Moh Sister [phone]               Current Medical History  Patient Admitting Diagnosis: Meningioma resection   History of Present Illness: 75 year old female with history or progressive memory and cognitive issues. Screened for possible Alzheimer's disease and MRI scan performed that revealed large frontal parasagittal extra-axial tumor consistent with meningioma with significant mass effect. Patient without evidence of history of metastatic disease. Presented on 10/24/200 for craniotomy and resection of tumor.   Postoperative with no witnessed seizures nor was there any activity on EEG monitoring. Postoperative with right sided weakness. 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    Patient's medical record from [facility]  has been reviewed by the rehabilitation admission coordinator and physician.   Past Medical History      Past Medical History:  Diagnosis Date   Diabetes mellitus without complication ([place])     Glaucoma     Glaucoma     Hyperlipidemia     Hypertension      Has the patient had major surgery during 100 days prior to admission? Yes   Family History   family history includes Breast cancer in her maternal aunt; Cancer in her mother; Dementia in her father; Diabetes in her mother; Emphysema in her mother; Glaucoma in her father.   Current Medications   Current Facility-Administered Medications:    acetaminophen (TYLENOL) tablet 650 mg, 650 mg, Oral, Q4H PRN, 650 mg at 01/19/21 2130 **OR** acetaminophen (TYLENOL) suppository 650 mg, 650 mg, Rectal, Q4H PRN, Earnie Larsson, MD    Chlorhexidine Gluconate Cloth 2 % PADS 6 each, 6 each, Topical, Daily, Earnie Larsson, MD, 6 each at 01/22/21 0906   dexamethasone (DECADRON) tablet 2 mg, 2 mg, Oral, BID, Pool, Mallie Mussel, MD, 2 mg at 01/22/21 2127   glipiZIDE (GLUCOTROL) tablet 5 mg, 5 mg, Oral, Q breakfast, Pool, Mallie Mussel, MD, 5 mg at 01/22/21 2094   HYDROcodone-acetaminophen (NORCO/VICODIN) 5-325 MG per tablet 1 tablet, 1 tablet, Oral, Q4H PRN, Earnie Larsson, MD, 1 tablet at 01/16/21 1648   HYDROmorphone (DILAUDID) injection 0.5-1 mg, 0.5-1 mg, Intravenous, Q2H PRN, Earnie Larsson, MD   influenza vaccine adjuvanted (FLUAD) injection 0.5 mL, 0.5 mL, Intramuscular, Tomorrow-1000, Pool, Henry, MD   insulin aspart (novoLOG) injection 0-9 Units, 0-9 Units, Subcutaneous,  TID WC, Bergman, Meghan D, NP, 1 Units at 01/23/21 0642   latanoprost (XALATAN) 0.005 % ophthalmic solution 1 drop, 1 drop, Both Eyes, QHS, Pool, Henry, MD, 1 drop at 01/22/21 2127   levETIRAcetam (KEPPRA) tablet 500 mg, 500 mg, Oral, BID, Pool, Mallie Mussel, MD, 500 mg at 01/22/21 2127   lisinopril (ZESTRIL) tablet 40 mg, 40 mg, Oral, Daily, Pool, Mallie Mussel, MD, 40 mg at 01/22/21 4696   metFORMIN (GLUCOPHAGE) tablet 1,000 mg, 1,000 mg, Oral, BID WC, Pool, Mallie Mussel, MD, 1,000 mg at 01/22/21 1756   methimazole (TAPAZOLE) tablet 2.5 mg, 2.5 mg, Oral, q AM, Pool, Mallie Mussel, MD, 2.5 mg at 01/23/21 2952   metoprolol succinate (TOPROL-XL) 24 hr tablet 50 mg, 50 mg, Oral, Daily, Pool, Mallie Mussel, MD, 50 mg at 01/22/21 0906   multivitamin with minerals tablet 1 tablet, 1 tablet, Oral, Daily, Pool, Mallie Mussel, MD, 1 tablet at 01/22/21 0905   ondansetron (ZOFRAN) tablet 4 mg, 4 mg, Oral, Q4H PRN **OR** ondansetron (ZOFRAN) injection 4 mg, 4 mg, Intravenous, Q4H PRN, Pool, Mallie Mussel, MD, 4 mg at 01/15/21 0519   pantoprazole (PROTONIX) EC tablet 40 mg, 40 mg, Oral, QHS, Pool, Mallie Mussel, MD, 40 mg at 01/22/21 2127   pioglitazone (ACTOS) tablet 15 mg, 15 mg, Oral, Q breakfast, Pool, Mallie Mussel, MD, 15 mg at 01/22/21 8413   promethazine  (PHENERGAN) tablet 12.5-25 mg, 12.5-25 mg, Oral, Q4H PRN, Earnie Larsson, MD   Patients Current Diet:  Diet Order                  Diet Carb Modified Fluid consistency: Thin; Room service appropriate? Yes with Assist  Diet effective now                       Precautions / Restrictions Precautions Precautions: Fall Precaution Comments: R hemiparesis Restrictions Weight Bearing Restrictions: No    Has the patient had 2 or more falls or a fall with injury in the past year? No   Prior Activity Level Limited Community (1-2x/wk): Independent and living alone until few months prior with declining cognition   Prior Functional Level Self Care: Did the patient need help bathing, dressing, using the toilet or eating? Independent   Indoor Mobility: Did the patient need assistance with walking from room to room (with or without device)? Independent   Stairs: Did the patient need assistance with internal or external stairs (with or without device)? Independent   Functional Cognition: Did the patient need help planning regular tasks such as shopping or remembering to take medications? Independent   Patient Information Are you of Hispanic, Latino/a,or Spanish origin?: A. No, not of Hispanic, Latino/a, or Spanish origin What is your race?: B. Black or African American Do you need or want an interpreter to communicate with a doctor or health care staff?: 0. No   Patient's Response To:  Health Literacy and Transportation Is the patient able to respond to health literacy and transportation needs?: No Health Literacy - How often do you need to have someone help you when you read instructions, pamphlets, or other written material from your doctor or pharmacy?: Patient unable to respond In the past 12 months, has lack of transportation kept you from medical appointments or from getting medications?: No In the past 12 months, has lack of transportation kept you from meetings, work, or from getting  things needed for daily living?: No   Development worker, international aid / [place] Devices/Equipment: CBG Meter, Eyeglasses Home Equipment: None  Prior Device Use: Indicate devices/aids used by the patient prior to current illness, exacerbation or injury? None of the above   Current Functional Level Cognition   Overall Cognitive Status: Impaired/Different from baseline Current Attention Level: Sustained Orientation Level: Disoriented to time Following Commands: Follows one step commands with increased time Safety/Judgement: Decreased awareness of safety, Decreased awareness of deficits General Comments: Pt follows most functional commands, answers some simple yes/no questions with increased time to respond, often needs repetition of question prior to response. Pt very incontinent of bladder while walking a few feet around bed but when asked by therapist just prior to this if she needed to go to bathroom, pt stated no.    Extremity Assessment (includes Sensation/Coordination)   Upper Extremity Assessment: Defer to OT evaluation RUE Deficits / Details: limited overhead ROM, able to touch the back of her head. Elbow ROM is WFL, MMT is 3/5 for elbow. poor grip strength. Pt noted not attending to RUE during funcitonal task and required assistance for safe placement during bed mobility RUE Sensation: decreased light touch RUE Coordination: decreased fine motor, decreased gross motor  Lower Extremity Assessment: RLE deficits/detail RLE Deficits / Details: grossly 4-/5, difficult to fully assess due to cognition RLE Sensation: decreased light touch RLE Coordination: decreased gross motor, decreased fine motor     ADLs   Overall ADL's : Needs assistance/impaired Eating/Feeding: Moderate assistance, Sitting Eating/Feeding Details (indicate cue type and reason): provided a rolled wash cloth to increase grasp for R hand. pt unaware when cup fell out of hand. pt holding straw in her mouth but  unaware it is not in the cup. Grooming: Wash/dry hands, Minimal assistance Upper Body Bathing: Moderate assistance Lower Body Bathing: Minimal assistance, Sit to/from stand Lower Body Bathing Details (indicate cue type and reason): min A for verbal cues and balance in standing to bathe peri area Upper Body Dressing : Moderate assistance Lower Body Dressing: Minimal assistance, Sit to/from stand Lower Body Dressing Details (indicate cue type and reason): pt doffed with min A, doffed bilat socks and donned dry pai wtih min A for RLE Toilet Transfer: Minimal assistance, Ambulation Toilet Transfer Details (indicate cue type and reason): simulated from chair >bed, pt required min A for short ambulation Toileting- Clothing Manipulation and Hygiene: Minimal assistance, Sit to/from stand Toileting - Clothing Manipulation Details (indicate cue type and reason): incontinent bladder this session upon standing. Min A overall fro clean up Functional mobility during ADLs: Minimal assistance General ADL Comments: pt with incontinent bladder during functional ambulation; session then spent on ADLs for clean up wtih several sit<>stands with 1 person [facility] assist     Mobility   Overal bed mobility: Needs Assistance Bed Mobility: Supine to Sit Rolling: Mod assist Sidelying to sit: Min assist Supine to sit: Min assist Sit to supine: Mod assist General bed mobility comments: cues for self-assist, HOB elevated to >40 degrees prior to transfer, good use of bed rail to get to EOB but needs heavy cues for contralateral hip scooting to foot flat.     Transfers   Overall transfer level: Needs assistance Equipment used: 1 person hand held assist, Rolling walker (2 wheels) Transfers: Sit to/from Stand Sit to Stand: Min guard, Min assist Stand pivot transfers: +2 physical assistance, Mod assist General transfer comment: minA for initial sit>stand to RW and stand>sit to toilet, then min guard from [facility] for sit>stand this  session and to chair for stand>sit     Ambulation / Gait / Stairs / Emergency planning/management officer  Ambulation/Gait Ambulation/Gait assistance: Min assist Gait Distance (Feet): 10 Feet (48ft to BSC, then 40ft to chair) Assistive device: Rolling walker (2 wheels) Gait Pattern/deviations: Decreased stride length, Wide base of support, Decreased dorsiflexion - right, Step-through pattern, Decreased step length - right General Gait Details: multimodal cues for improved RLE step length/height, pt incontinent of bladder while walking around end of bed and needed BSC brought to her (but had already emptied bladder) so sat down before final walk to get cleaned up/new socks and gown. Gait velocity: decreased Gait velocity interpretation: <1.31 ft/sec, indicative of household ambulator     Posture / Balance Dynamic Sitting Balance Sitting balance - Comments: initially holding rail on foot of bed, after seated and feet assisted to floor could sit with S no UE support Balance Overall balance assessment: Needs assistance Sitting-balance support: Feet supported Sitting balance-Leahy Scale: Fair Sitting balance - Comments: initially holding rail on foot of bed, after seated and feet assisted to floor could sit with S no UE support Standing balance support: Single extremity supported, During functional activity Standing balance-Leahy Scale: Fair Standing balance comment: min guard to minA, increased assist with turning and for safety with stepping around obstacles     Special needs/care consideration Seizure precautions    Previous Home Environment  Living Arrangements: Alone  Lives With: Alone Available Help at Discharge: Family, Available 24 hours/day (daughter, [name], moving in with her) Type of Home: House Home Layout: One level Home Access: Level entry Bathroom Shower/Tub: Chiropodist: Standard Bathroom Accessibility: Yes How Accessible: Accessible via walker [place]:  No Additional Comments: Dog names [place]   Discharge Living Setting Plans for Discharge Living Setting: Patient's home ([name], daughter, moving in with patient) Type of Home at Discharge: House Discharge Home Layout: One level Discharge Home Access: Level entry Discharge Bathroom Shower/Tub: Tub/shower unit Discharge Bathroom Toilet: Standard Discharge Bathroom Accessibility: Yes How Accessible: Accessible via walker Does the patient have any problems obtaining your medications?: No   Social/Family/Support Systems Patient Roles: Parent Contact Information: Virl Axe, Daughter Anticipated Caregiver: [name] Anticipated Caregiver's Contact Information: see contacts Ability/Limitations of Caregiver: [name] is retired Careers adviser: 24/7 Discharge Plan Discussed with Primary Caregiver: Yes Is Caregiver In Agreement with Plan?: Yes Does Caregiver/Family have Issues with Lodging/Transportation while Pt is in Rehab?: No   Goals Patient/Family Goal for Rehab: supervision to min assist with PT, OT and SLP Expected length of stay: ELOS 10 to 14 days Pt/Family Agrees to Admission and willing to participate: Yes Program Orientation Provided & Reviewed with Pt/Caregiver Including Roles  & Responsibilities: Yes   Decrease burden of Care through IP rehab admission: n/a   Possible need for SNF placement upon discharge: not anticipated   Patient Condition: I have reviewed medical records from [facility] , spoken with CM, and patient and daughter. I met with patient at the bedside for inpatient rehabilitation assessment.  Patient will benefit from ongoing PT, OT, and SLP, can actively participate in 3 hours of therapy a day 5 days of the week, and can make measurable gains during the admission.  Patient will also benefit from the coordinated team approach during an Inpatient Acute Rehabilitation admission.  The patient will receive intensive therapy as well as Rehabilitation physician,  nursing, social worker, and care management interventions.  Due to bladder management, bowel management, safety, skin/wound care, disease management, medication administration, pain management, and patient education the patient requires 24 hour a day rehabilitation nursing.  The patient is currently min  to mod assist overall with mobility and basic ADLs.  Discharge setting and therapy post discharge at  home with home health is anticipated.  Patient has agreed to participate in the Acute Inpatient Rehabilitation Program and will admit today.   Preadmission Screen Completed By:  Cleatrice Burke, 01/23/2021 10:05 AM ______________________________________________________________________   Discussed status with Dr. Ranell Patrick on 01/23/2021 at 1005 and received approval for admission today.   Admission Coordinator:  Cleatrice Burke, RN, time 1005 Date 01/23/2021    Assessment/Plan: Diagnosis: Meningioma Does the need for close, 24 hr/day Medical supervision in concert with the patient's rehab needs make it unreasonable for this patient to be served in a less intensive setting? Yes Co-Morbidities requiring supervision/potential complications: dental abscess, goiter, hypertension associated with diabetes, smoker, insomnia Due to bladder management, bowel management, safety, skin/wound care, disease management, medication administration, pain management, and patient education, does the patient require 24 hr/day rehab nursing? Yes Does the patient require coordinated care of a physician, rehab nurse, PT, OT, and SLP to address physical and functional deficits in the context of the above medical diagnosis(es)? Yes Addressing deficits in the following areas: balance, endurance, locomotion, strength, transferring, bowel/bladder control, bathing, dressing, feeding, grooming, toileting, speech, and psychosocial support Can the patient actively participate in an intensive therapy program of at least 3 hrs  of therapy 5 days a week? Yes The potential for patient to make measurable gains while on inpatient rehab is excellent Anticipated functional outcomes upon discharge from inpatient rehab: supervision PT, supervision OT, supervision SLP Estimated rehab length of stay to reach the above functional goals is: 10-14 days Anticipated discharge destination: Home 10. Overall Rehab/Functional Prognosis: excellent     MD Signature: Leeroy Cha, MD         Revision History                               Note Details  Author Izora Ribas, MD File Time 01/23/2021 10:35 AM  Author Type Physician Status Signed  Last Editor Izora Ribas, MD Service Physical Medicine and [place] # [id number] [place] Date 01/23/2021

## 2021-01-24 DIAGNOSIS — I10 Essential (primary) hypertension: Secondary | ICD-10-CM

## 2021-01-24 DIAGNOSIS — D62 Acute posthemorrhagic anemia: Secondary | ICD-10-CM

## 2021-01-24 DIAGNOSIS — E1169 Type 2 diabetes mellitus with other specified complication: Secondary | ICD-10-CM

## 2021-01-24 DIAGNOSIS — E669 Obesity, unspecified: Secondary | ICD-10-CM

## 2021-01-24 LAB — CBC WITH DIFFERENTIAL/PLATELET
Abs Immature Granulocytes: 0.33 10*3/uL — ABNORMAL HIGH (ref 0.00–0.07)
Basophils Absolute: 0 10*3/uL (ref 0.0–0.1)
Basophils Relative: 0 %
Eosinophils Absolute: 0.1 10*3/uL (ref 0.0–0.5)
Eosinophils Relative: 2 %
HCT: 29.8 % — ABNORMAL LOW (ref 36.0–46.0)
Hemoglobin: 9.7 g/dL — ABNORMAL LOW (ref 12.0–15.0)
Immature Granulocytes: 5 %
Lymphocytes Relative: 32 %
Lymphs Abs: 2.3 10*3/uL (ref 0.7–4.0)
MCH: 24.7 pg — ABNORMAL LOW (ref 26.0–34.0)
MCHC: 32.6 g/dL (ref 30.0–36.0)
MCV: 76 fL — ABNORMAL LOW (ref 80.0–100.0)
Monocytes Absolute: 0.8 10*3/uL (ref 0.1–1.0)
Monocytes Relative: 11 %
Neutro Abs: 3.5 10*3/uL (ref 1.7–7.7)
Neutrophils Relative %: 50 %
Platelets: 305 10*3/uL (ref 150–400)
RBC: 3.92 MIL/uL (ref 3.87–5.11)
RDW: 16.8 % — ABNORMAL HIGH (ref 11.5–15.5)
WBC: 7 10*3/uL (ref 4.0–10.5)
nRBC: 0 % (ref 0.0–0.2)

## 2021-01-24 LAB — COMPREHENSIVE METABOLIC PANEL
ALT: 15 U/L (ref 0–44)
AST: 15 U/L (ref 15–41)
Albumin: 2.7 g/dL — ABNORMAL LOW (ref 3.5–5.0)
Alkaline Phosphatase: 48 U/L (ref 38–126)
Anion gap: 6 (ref 5–15)
BUN: 19 mg/dL (ref 8–23)
CO2: 27 mmol/L (ref 22–32)
Calcium: 9 mg/dL (ref 8.9–10.3)
Chloride: 103 mmol/L (ref 98–111)
Creatinine, Ser: 0.85 mg/dL (ref 0.44–1.00)
GFR, Estimated: >60 mL/min (ref >60)
Glucose, Bld: 128 mg/dL — ABNORMAL HIGH (ref 70–99)
Potassium: 4 mmol/L (ref 3.5–5.1)
Sodium: 136 mmol/L (ref 135–145)
Total Bilirubin: 0.5 mg/dL (ref 0.3–1.2)
Total Protein: 5.2 g/dL — ABNORMAL LOW (ref 6.5–8.1)

## 2021-01-24 LAB — GLUCOSE, CAPILLARY
Glucose-Capillary: 119 mg/dL — ABNORMAL HIGH (ref 70–99)
Glucose-Capillary: 119 mg/dL — ABNORMAL HIGH (ref 70–99)
Glucose-Capillary: 68 mg/dL — ABNORMAL LOW (ref 70–99)
Glucose-Capillary: 148 mg/dL — ABNORMAL HIGH (ref 70–99)
Glucose-Capillary: 96 mg/dL (ref 70–99)

## 2021-01-24 MED ORDER — FERROUS SULFATE 325 (65 FE) MG PO TABS
325.0000 mg | ORAL_TABLET | Freq: Every day | ORAL | Status: DC
  Administered 2021-01-24 – 2021-02-07 (×15): 325 mg via ORAL
  Filled 2021-01-24 (×15): qty 1

## 2021-01-24 NOTE — Evaluation (Signed)
Speech Language Pathology Assessment and Plan  Patient Details  Name: Ann Chaney MRN: [id number] Date of Birth: Jan 17, 1946  SLP Diagnosis: Aphasia;Cognitive Impairments;Speech and Language deficits  Rehab Potential: Good ELOS: ~2 weeks   Today's Date: 01/24/2021 SLP Individual Time: 1100-1145 SLP Individual Time Calculation (min): 45 min and Today's Date: 01/24/2021 SLP Missed Time: 15 Minutes Missed Time Reason: Patient fatigue  Hospital Problem: Principal Problem:   S/P resection of meningioma  Past Medical History:  Past Medical History:  Diagnosis Date   Diabetes mellitus without complication ([place])    Glaucoma    Glaucoma    Hyperlipidemia    Hypertension    Past Surgical History:  Past Surgical History:  Procedure Laterality Date   ABDOMINAL HYSTERECTOMY     CRANIOTOMY Left 01/14/2021   Procedure: Craniotomy - left - Frontal - Tumor;  Surgeon: Earnie Larsson, MD;  Location: [facility];  Service: Neurosurgery;  Laterality: Left;   HYSTEROTOMY      Assessment / Plan / Recommendation Clinical Impression  Patient is a 75 y.o. year old RH female with history of T2DM, HTN, hyperthyroidism, cognitive decline, HA and confusion for the past 5 - 6  months who was evaluated by Dr. Shah/neurology and work up done revealing 6.2 x 3.3 X 4.8 cm large left frontal meningioma with partial effacement of left>right ventricle and 6 mm mass effect. She was admitted on 01/14/21 for bifrontal craniotomy with resection of left parasagittal extra-axial tumor. Postop had right-sided weakness with decrease in verbal output and follow-up MRI brain done revealing mass resection without evidence of residual, small cortical and subcortical infarcts and bifrontal to the right occipital region and small volume fluid beneath bone flap. EEG done due to concerns of postictal state and showed moderate diffuse encephalopathy without seizures. Mentation and verbal output has improved however she continues to have a  mild right hemiparesis. Therapy has been ongoing and patient noted to have delayed in processing to follow one-step commands, shuffling gait with tendency to drag right foot, difficulty utilizing LUE for use of walker and difficulty with problem-solving.  CIR was recommended due to functional decline. Patient transferred to CIR on 01/23/2021.  Pt demonstrates expressive/receptive language deficits that appeared severe in nature. There is likelihood that cognitive/attention deficits exacerbate communication difficulties. Pt exhibited limited interaction/participation with SLP likely secondary to fatigue per daughter report. Pt had difficulty responding to open ended questions and more likely to respond to yes/no questions via verbal response or head nod. Pt responded to ~50% of biographical and environmental yes/no questions and provided no response during all other attempts despite max multimodal cues. No response given with 1-step commands, object identification, reading comprehension at word level, object naming, and automatic speech tasks. Pt occasionally responded to daughter to indicate that she was tired. Per daughter, since hospitalization pt requires significant processing time, has decreased response length, and is unlikely to initiate communication with staff to communicate needs. Exhibiting minimal verbal output has been a typical behavior when patient is fatigued. Daughter reported pt was independent at baseline, though received assist with medication management and pt was known to be forgetful at times. Daughter plans to move in with patient at discharge. Patient would benefit from skilled SLP intervention to maximize her cognitive-communication functioning and overall functional independence prior to discharge. Recommend further cognitive-communication and language assessment during upcoming sessions.     Skilled Therapeutic Interventions          Speech, language, cognitive evaluations completed.  See above for details.  SLP Assessment  Patient will need skilled [place] [facility] during CIR admission    Recommendations  Recommendations for Other Services: Neuropsych consult Patient destination: Home Follow up Recommendations: 24 hour supervision/assistance;Home Health SLP Equipment Recommended: None recommended by SLP    SLP Frequency 3 to 5 out of 7 days   SLP Duration  SLP Intensity  SLP Treatment/Interventions ~2 weeks  Minumum of 1-2 x/day, 30 to 90 minutes  Cognitive remediation/compensation;Multimodal communication approach;Internal/external aids;Cueing hierarchy;Functional tasks;Patient/family education;Therapeutic Activities    Pain Pain Assessment Pain Score: 0-No pain  Prior Functioning Cognitive/Linguistic Baseline: Baseline deficits Baseline deficit details: daughter reported "forgetfullness" and also assisted with medications by setting up pillbox for pt on a weekly basis Type of Home: House  Lives With: Alone (daughter plans to move in with pt at discharge) Available Help at Discharge: Family;Available 24 hours/day Vocation: Retired  Programmer, systems Overall Cognitive Status: Impaired/Different from baseline Arousal/Alertness: Awake/alert Orientation Level: Oriented to person Year:  (no response) Month:  (no response) Day of Week:  (no response) Attention: Focused Focused Attention: Impaired Focused Attention Impairment: Verbal basic Sustained Attention: Impaired Sustained Attention Impairment: Verbal basic Memory: Impaired Memory Impairment: Decreased recall of new information;Retrieval deficit;Storage deficit Awareness: Impaired Awareness Impairment: Intellectual impairment Problem Solving: Impaired Problem Solving Impairment: Functional basic;Verbal basic Executive Function: Self Monitoring Self Monitoring: Impaired Self Monitoring Impairment: Functional basic;Verbal basic Safety/Judgment: Impaired   Comprehension Auditory Comprehension Overall Auditory Comprehension: Impaired Yes/No Questions: Impaired Basic Biographical Questions: 26-50% accurate Basic Immediate Environment Questions: 25-49% accurate Conversation: Simple Interfering Components: Attention;Processing speed EffectiveTechniques: Extra processing time;Repetition Visual Recognition/Discrimination Discrimination: Not tested Reading Comprehension Reading Status: Impaired Word level: Impaired Expression Expression Primary Mode of Expression: Verbal Verbal Expression Overall Verbal Expression: Impaired Initiation: Impaired Automatic Speech: Name;Social Response Level of Generative/Spontaneous Verbalization: Word Repetition: Impaired Level of Impairment: Word level Naming: Impairment Responsive: 0-25% accurate (no response) Pragmatics: Impairment Impairments: Eye contact Written Expression Dominant Hand: Right Oral Motor Oral Motor/Sensory Function Overall Oral Motor/Sensory Function: Within functional limits Motor Speech Overall Motor Speech: Appears within functional limits for tasks assessed Articulation: Within functional limitis  Care Tool Care Tool Cognition Ability to hear (with hearing aid or hearing appliances if normally used Ability to hear (with hearing aid or hearing appliances if normally used): 0. Adequate - no difficulty in normal conservation, social interaction, listening to TV   Expression of Ideas and Wants Expression of Ideas and Wants: 2. Frequent difficulty - frequently exhibits difficulty with expressing needs and ideas   Understanding Verbal and Non-Verbal Content Understanding Verbal and Non-Verbal Content: 2. Sometimes understands - understands only basic conversations or simple, direct phrases. Frequently requires cues to understand  Memory/Recall Ability Memory/Recall Ability : That he or she is in a hospital/hospital unit   Short Term Goals: Week 1: SLP Short Term Goal 1 (Week  1): Patient will participate in further cognitive-linguistic and language assessments to further identify functional needs SLP Short Term Goal 2 (Week 1): Patient will respond to biographical and/or environmental yes/no questions during 70% of occasions with mod A verbal/visual cues SLP Short Term Goal 3 (Week 1): Patient will express basic wants/needs/ideas after initial cue with mod A cues SLP Short Term Goal 4 (Week 1): Patient will follow 1-step commands with mod A verbal cues SLP Short Term Goal 5 (Week 1): Patient will attend to basic, familiar tasks for 3 minute intervals with mod A verbal cues for redirection SLP Short Term Goal 6 (Week 1): Patient will demonstrate orientation x4 with  mod A cues for use of external aids  Refer to Care Plan for Long Term Goals  Recommendations for other services: Neuropsych  Discharge Criteria: Patient will be discharged from SLP if patient refuses treatment 3 consecutive times without medical reason, if treatment goals not met, if there is a change in medical status, if patient makes no progress towards goals or if patient is discharged from hospital.  The above assessment, treatment plan, treatment alternatives and goals were discussed and mutually agreed upon: by patient  Patty Sermons 01/24/2021, 5:30 PM

## 2021-01-24 NOTE — Progress Notes (Signed)
Inpatient Rehabilitation Care Coordinator Assessment and Plan Patient Details  Name: Ann Chaney MRN: [id number] Date of Birth: 06/03/1945  Today's Date: 01/24/2021  Hospital Problems: Principal Problem:   S/P resection of meningioma  Past Medical History:  Past Medical History:  Diagnosis Date   Diabetes mellitus without complication ([place])    Glaucoma    Glaucoma    Hyperlipidemia    Hypertension    Past Surgical History:  Past Surgical History:  Procedure Laterality Date   ABDOMINAL HYSTERECTOMY     CRANIOTOMY Left 01/14/2021   Procedure: Craniotomy - left - Frontal - Tumor;  Surgeon: Earnie Larsson, MD;  Location: [facility];  Service: Neurosurgery;  Laterality: Left;   HYSTEROTOMY     Social History:  reports that she quit smoking about 5 months ago. Her smoking use included cigarettes. She smoked an average of .5 packs per day. She has never used smokeless tobacco. She reports that she does not drink alcohol and does not use drugs.  Family / Support Systems Marital Status: Divorced How Long?: 33 years Patient Roles: Parent Spouse/Significant Other: Divorced Children: 1 adult dtr-Ann Chaney [phone]) Other Supports: None reported Anticipated Caregiver: dtr Ann Chaney Ability/Limitations of Caregiver: Dtr will be moving into the home with her mother Family Dynamics: Pt lived at home alone, dtr would check in intermittently and supervise with medications.  Social History Preferred language: English Religion: Patient Refused Cultural Background: Pt worked in [employer] until the job dissolved. Pt also worked as a Charity fundraiser for 4-5 years. Education: high school grad and some college. Health Literacy - How often do you need to have someone help you when you read instructions, pamphlets, or other written material from your doctor or pharmacy?: Sometimes Writes: Yes Employment Status: Retired Date Retired/Disabled/Unemployed: 2011 Age Retired: 65 Lexicographer Issues: Denies Guardian/Conservator: N/A   Abuse/Neglect Abuse/Neglect Assessment Can Be Completed: Unable to assess, patient is non-responsive or altered mental status Physical Abuse: Denies Verbal Abuse: Denies Sexual Abuse: Denies Exploitation of patient/patient's resources: Denies Self-Neglect: Denies  Patient response to: Social Isolation - How often do you feel lonely or isolated from those around you?: Never  Emotional Status Pt's affect, behavior and adjustment status: Pt in good spirits at time of visit. Pt not able to respond to questions asked. Recent Psychosocial Issues: Denies Psychiatric History: Denies Substance Abuse History: Pt recently quit smoking at time of admission. Pt reported she drinks alcohol however unsure if this is accurate.  Patient / Family Perceptions, Expectations & Goals Pt/Family understanding of illness & functional limitations: Pt dtr Ann Chaney has a general understanding of pt care needs Premorbid pt/family roles/activities: Independent with supervision with medications Anticipated changes in roles/activities/participation: Assistance with ADLs/IADLs Pt/family expectations/goals: Daughter's goal is for her mother to be able to strnegthen her right leg/arm and do as much as she is able as she was before admission sif possible.  Community Resources Express Scripts: None Premorbid Home Care/DME Agencies: None Transportation available at discharge: Pt dtr Ann Chaney Is the patient able to respond to transportation needs?: Yes In the past 12 months, has lack of transportation kept you from medical appointments or from getting medications?: No In the past 12 months, has lack of transportation kept you from meetings, work, or from getting things needed for daily living?: No Resource referrals recommended: Neuropsychology  Discharge Planning Living Arrangements: Alone Support Systems: Spouse/significant other Type of Residence: Private  residence Insurance Resources: Multimedia programmer (specify) ([place] Medicare) Financial Resources: Radio broadcast assistant Screen Referred:  No Living Expenses: Own Money Management: Patient Does the patient have any problems obtaining your medications?: No Home Management: Reported pt managed all homecare needs, and her dtr would drive her to appointments. Patient/Family Preliminary Plans: Dtr will assist Care Coordinator Barriers to Discharge: Decreased caregiver support, Lack of/limited family support Care Coordinator Anticipated Follow Up Needs: HH/OP  Clinical Impression SW met with pt and pt dtr Ann in room to introduce self, explain role, and discuss discharge process. 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 HCPOA- dtr Ann Chaney. No DME. SW provided pt dtr with Medicaid application and resources for PACE in [place] due to limited natural supports.   Burke Terry A Kenyatte Chatmon 01/24/2021, 2:22 PM

## 2021-01-24 NOTE — Plan of Care (Signed)
  Problem: RH Cognition - SLP Goal: RH LTG Patient will demonstrate orientation with cues Description:  LTG:  Patient will demonstrate orientation to person/place/time/situation with cues (SLP)   Flowsheets (Taken 01/24/2021 1731) LTG: Patient will demonstrate orientation using cueing (SLP): Minimal Assistance - Patient > 75%   Problem: RH Comprehension Communication Goal: LTG Patient will comprehend basic/complex auditory (SLP) Description: LTG: Patient will comprehend basic/complex auditory information with cues (SLP). Flowsheets (Taken 01/24/2021 1731) LTG: Patient will comprehend auditory information with cueing (SLP):  Minimal Assistance - Patient > 75%  Moderate Assistance - Patient 50 - 74%   Problem: RH Expression Communication Goal: LTG Patient will verbally express basic/complex needs(SLP) Description: LTG:  Patient will verbally express basic/complex needs, wants or ideas with cues  (SLP) Flowsheets (Taken 01/24/2021 1731) LTG: Patient will verbally express basic/complex needs, wants or ideas (SLP):  Minimal Assistance - Patient > 75%  Moderate Assistance - Patient 50 - 74%   Problem: RH Attention Goal: LTG Patient will demonstrate this level of attention during functional activites (SLP) Description: LTG:  Patient will will demonstrate this level of attention during functional activites (SLP) Flowsheets (Taken 01/24/2021 1731) Patient will demonstrate during cognitive/linguistic activities the attention type of:  Focused  Sustained LTG: Patient will demonstrate this level of attention during cognitive/linguistic activities with assistance of (SLP): Minimal Assistance - Patient > 75%

## 2021-01-24 NOTE — Progress Notes (Signed)
PROGRESS NOTE   Subjective/Complaints: Pt in bathroom with OT. Seems to be comfortable without issues.   ROS: Limited due to cognitive/behavioral    Objective:   No results found. Recent Labs    01/24/21 0522  WBC 7.0  HGB 9.7*  HCT 29.8*  PLT 305   Recent Labs    01/24/21 0522  NA 136  K 4.0  CL 103  CO2 27  GLUCOSE 128*  BUN 19  CREATININE 0.85  CALCIUM 9.0    Intake/Output Summary (Last 24 hours) at 01/24/2021 1019 Last data filed at 01/24/2021 0817 Gross per 24 hour  Intake 720 ml  Output --  Net 720 ml        Physical Exam: Vital Signs Blood pressure (!) 116/49, pulse (!) 58, temperature 98.5 F (36.9 C), temperature source Oral, resp. rate 16, height 5\' 1"  (1.549 m), weight 52.4 kg, SpO2 100 %.  General: Alert.  No apparent distress HEENT: Head is normocephalic, atraumatic, PERRLA, EOMI, sclera anicteric, oral mucosa pink and moist, dentition intact, ext ear canals clear,  Neck: Supple without JVD or lymphadenopathy Heart: Reg rate and rhythm. No murmurs rubs or gallops Chest: CTA bilaterally without wheezes, rales, or rhonchi; no distress Abdomen: Soft, non-tender, non-distended, bowel sounds positive. Extremities: No clubbing, cyanosis, or edema. Pulses are 2+ Psych: Pt's affect is appropriate. Pt is cooperative Skin: crani incision over fronto-parietal scalp Neuro:  Pt oriented to person, month. Not place or why she's here. Processing delays. Normal language. Decreased insight and awareness. Moves all 4 limbs Musculoskeletal: fair posture. No pain    Assessment/Plan: 1. Functional deficits which require 3+ hours per day of interdisciplinary therapy in a comprehensive inpatient rehab setting. Physiatrist is providing close team supervision and 24 hour management of active medical problems listed below. Physiatrist and rehab team continue to assess barriers to discharge/monitor patient  progress toward functional and medical goals  Care Tool:  Bathing    Body parts bathed by patient: Right arm, Chest, Left arm, Abdomen, Front perineal area, Buttocks, Left upper leg, Face, Right lower leg, Left lower leg, Right upper leg         Bathing assist Assist Level: Minimal Assistance - Patient > 75%     Upper Body Dressing/Undressing Upper body dressing   What is the patient wearing?: Pull over shirt    Upper body assist Assist Level: Minimal Assistance - Patient > 75%    Lower Body Dressing/Undressing Lower body dressing      What is the patient wearing?: Pants, Incontinence brief     Lower body assist Assist for lower body dressing: Moderate Assistance - Patient 50 - 74%     Toileting Toileting    Toileting assist Assist for toileting: Minimal Assistance - Patient > 75%     Transfers Chair/bed transfer  Transfers assist     Chair/bed transfer assist level: Minimal Assistance - Patient > 75%     Locomotion Ambulation   Ambulation assist              Walk 10 feet activity   Assist           Walk 50 feet activity  Assist           Walk 150 feet activity   Assist           Walk 10 feet on uneven surface  activity   Assist           Wheelchair     Assist               Wheelchair 50 feet with 2 turns activity    Assist            Wheelchair 150 feet activity     Assist          Blood pressure (!) 116/49, pulse (!) 58, temperature 98.5 F (36.9 C), temperature source Oral, resp. rate 16, height 5\' 1"  (1.549 m), weight 52.4 kg, SpO2 100 %.  Medical Problem List and Plan: 1.  Impaired mobility following large parasagittal meningioma resection             -patient may shower               -ELOS/Goals: 10-14 days S 2.  Antithrombotics: -DVT/anticoagulation:  Mechanical: Sequential compression devices, below knee Bilateral lower extremities             -antiplatelet therapy:  N/a 3. Pain Management: Continue hydrocodone prn.  4. Mood: LCSW to follow for evaluation and support.              -antipsychotic agents: N/A 5. Neuropsych: This patient is not fully capable of making decisions on her own behalf. 6. Skin/Wound Care: Routine pressure relief measures. Monitor incision for healing.  7. Fluids/Electrolytes/Nutrition: Monitor I/O.              --encourage PO I personally reviewed the patient's labs today.  .  8. HTN: Monitor BP TID- Continue Lisinopril and Toprol.              --BP/HR stable, HR sl brady 9. T2DM: Hgb A1C-6.2 and well controlled on Metformin, Actos and Glipizide             --Monitor BS ac/hs and use SSI for elevated BS.            CBG (last 3)  Recent Labs    01/23/21 1708 01/23/21 2103 01/24/21 0623  GLUCAP 128* 94 119*   -controlled 11/3 10. Goiter/Hyperthyroidism: Continue Methimazole.  11. Seizure pophylaxis: Has been seizure free >7 days since surgery. D/c Keppra.              --has been on decadron since 12/21/20--> decadron 1 mg bid.   -continue for 2 days then stop 12. Constipation: Will add Senna S at bedtime.  13. Hyponatremia: Na down to 132 on 10/25, from 137 12 days ago-->recheck in am.  14. Leucocytosis: WBC up to 18.7 on 10/25, up from 9.1 12 days ago--> steroid effect v/s infection. --monitor for fevers and other signs of infection 15. Acute blood loss anemia: Hgb 11.8 on 11.2, down from 13.1 12 days prior.   Hgb 9.7 11/3, likely post-op, fluids. No clinical signs of blood loss -recheck Monday.  16. Open angle glaucoma:  Stable on Xalatan.  17. Insomnia:  continue low dose melatonin.     LOS: 1 days A FACE TO FACE EVALUATION WAS PERFORMED  Meredith Staggers 01/24/2021, 10:19 AM

## 2021-01-24 NOTE — Evaluation (Signed)
Physical Therapy Assessment and Plan  Patient Details  Name: Ann Chaney MRN: [id number] Date of Birth: 1945-11-25  PT Diagnosis: Abnormality of gait, Cognitive deficits, Coordination disorder, Difficulty walking, and Hemiplegia dominant Rehab Potential: Excellent ELOS: 12-14 days   Today's Date: 01/24/2021 PT Individual Time: 6389-3734 PT Individual Time Calculation (min): 69 min    Hospital Problem: Principal Problem:   S/P resection of meningioma   Past Medical History:  Past Medical History:  Diagnosis Date   Diabetes mellitus without complication ([place])    Glaucoma    Glaucoma    Hyperlipidemia    Hypertension    Past Surgical History:  Past Surgical History:  Procedure Laterality Date   ABDOMINAL HYSTERECTOMY     CRANIOTOMY Left 01/14/2021   Procedure: Craniotomy - left - Frontal - Tumor;  Surgeon: Earnie Larsson, MD;  Location: [facility];  Service: Neurosurgery;  Laterality: Left;   HYSTEROTOMY      Assessment & Plan Clinical Impression: Patient is a 75 y.o. year old RH female with history of T2DM, HTN, hyperthyroidism, cognitive decline, HA and confusion for the past 5 - 6  months who was evaluated by Dr. Shah/neurology and work up done revealing 6.2 x 3.3 X 4.8 cm large left frontal meningioma with partial effacement of left>right ventricle and 6 mm mass effect. She was started on decadron and admitted on 01/14/21 for bifrontal craniotomy with resection of left parasagittal extra-axial tumor by Dr. Trenton Gammon.  Postop had right-sided weakness with decrease in verbal output and follow-up MRI brain done revealing mass resection without evidence of residual, small cortical and subcortical infarcts and bifrontal to the right occipital region and small volume fluid beneath bone flap.     EEG done due to concerns of postictal state and showed moderate diffuse encephalopathy without seizures.  Mentation and verbal output has improved however she continues to have a mild right  hemiparesis.  Keppra added 10/28 for seizure prophylaxis?Marland Kitchen  To start decadron taper today. Therapy has been ongoing and patient noted to have delayed in processing to follow one-step commands, shuffling gait with tendency to drag right foot, difficulty utilizing LUE for use of walker and difficulty with problem-solving.  CIR was recommended due to functional decline. Patient transferred to CIR on 01/23/2021 .   Patient currently requires min with mobility secondary to muscle weakness, decreased cardiorespiratoy endurance, abnormal tone, motor apraxia, decreased coordination, and decreased motor planning, decreased initiation, decreased attention, decreased awareness, decreased problem solving, decreased safety awareness, decreased memory, and delayed processing, and decreased sitting balance, decreased standing balance, decreased postural control, hemiplegia, and decreased balance strategies.  Prior to hospitalization, patient was independent  with mobility and lived with Alone in a House home.  Home access is 2 single steps to enterStairs to enter.  Patient will benefit from skilled PT intervention to maximize safe functional mobility, minimize fall risk, and decrease caregiver burden for planned discharge home with 24 hour supervision.  Anticipate patient will benefit from follow up OP at discharge.  PT - End of Session Activity Tolerance: Tolerates 30+ min activity with multiple rests Endurance Deficit: Yes Endurance Deficit Description: Requires frequent rest breaks, SOB with minimal activity PT Assessment Rehab Potential (ACUTE/IP ONLY): Excellent PT Barriers to Discharge: Home environment access/layout;Incontinence;Behavior PT Patient demonstrates impairments in the following area(s): Balance;Perception;Safety;Behavior;Edema;Skin Integrity;Endurance;Motor;Nutrition;Pain PT Transfers Functional Problem(s): Bed Mobility;Bed to Chair;Car;Furniture PT Locomotion Functional Problem(s):  Ambulation;Wheelchair Mobility;Stairs PT Plan PT Intensity: Minimum of 1-2 x/day ,45 to 90 minutes PT Frequency: 5 out of 7  days PT Duration Estimated Length of Stay: 12-14 days PT Treatment/Interventions: Ambulation/gait training;Cognitive remediation/compensation;Discharge planning;Functional mobility training;DME/adaptive equipment instruction;Pain management;Psychosocial support;Splinting/orthotics;Therapeutic Activities;UE/LE Strength taining/ROM;Wheelchair propulsion/positioning;UE/LE Coordination activities;Visual/perceptual remediation/compensation;Therapeutic Exercise;Stair training;Skin care/wound management;Patient/family education;Neuromuscular re-education;Functional electrical stimulation;Disease management/prevention;Community reintegration;Balance/vestibular training PT Transfers Anticipated Outcome(s): supervision using LRAD PT Locomotion Anticipated Outcome(s): supervision using LRAD PT Recommendation Follow Up Recommendations: Outpatient PT Patient destination: Home Equipment Recommended: To be determined   PT Evaluation Precautions/Restrictions Precautions Precautions: Fall Precaution Comments: R hemiparesis, aphasia Restrictions Weight Bearing Restrictions: No General   Vital Signs Pain Pain Assessment Pain Scale: 0-10 Pain Score: 0-No pain Pain Interference Pain Interference Pain Effect on Sleep: 8. Unable to answer Pain Interference with Therapy Activities: 8. Unable to answer Pain Interference with Day-to-Day Activities: 8. Unable to answer Home Living/Prior Functioning Home Living Available Help at Discharge: Family;Available 24 hours/day (daughter Ann Chaney plans to stay with pt) Type of Home: House Home Access: Stairs to enter [employer] of Steps: 2 single steps to enter Entrance Stairs-Rails: None Home Layout: One level Bathroom Shower/Tub: Chiropodist: Standard Bathroom Accessibility: Yes Additional Comments: Has a dog  names Ann Chaney  Lives With: Alone Prior Function Level of Independence: Independent with basic ADLs;Independent with homemaking with ambulation;Independent with gait;Independent with transfers  Able to Take Stairs?: Yes Vocation: Retired Vision/Perception  Vision - History Ability to See in Adequate Light: 1 Impaired Perception Perception: Within Functional Limits Praxis Praxis: Impaired Praxis Impairment Details: Motor planning  Cognition Overall Cognitive Status: Impaired/Different from baseline Arousal/Alertness: Awake/alert Year: 2022 Month: November Day of Week: Correct Attention: Sustained Memory: Impaired Memory Impairment: Decreased recall of new information;Retrieval deficit;Storage deficit Awareness: Impaired Awareness Impairment: Intellectual impairment Problem Solving: Impaired Problem Solving Impairment: Functional basic Executive Function: Self Monitoring Self Monitoring: Impaired Self Monitoring Impairment: Functional basic Safety/Judgment: Impaired Sensation Sensation Light Touch: Appears Intact Coordination Gross Motor Movements are Fluid and Coordinated: No Fine Motor Movements are Fluid and Coordinated: No Coordination and Movement Description: R hemi with dysmetria Heel Shin Test: unable on R due to hip flexor weakness Motor  Motor Motor: Hemiplegia Motor - Skilled Clinical Observations: R hemiplegia, slow processing   Trunk/Postural Assessment  Cervical Assessment Cervical Assessment: Within Functional Limits Thoracic Assessment Thoracic Assessment: Within Functional Limits Lumbar Assessment Lumbar Assessment: Within Functional Limits Postural Control Postural Control: Deficits on evaluation (decreased/delayed)  Balance Balance Balance Assessed: Yes Static Sitting Balance Static Sitting - Balance Support: Feet supported Static Sitting - Level of Assistance: 5: Stand by assistance Dynamic Sitting Balance Dynamic Sitting - Balance Support:  During functional activity Dynamic Sitting - Level of Assistance: 5: Stand by assistance Static Standing Balance Static Standing - Balance Support: During functional activity Static Standing - Level of Assistance: 4: Min assist Dynamic Standing Balance Dynamic Standing - Balance Support: During functional activity Dynamic Standing - Level of Assistance: 4: Min assist Extremity Assessment      RLE Assessment RLE Assessment: Exceptions to [facility] RLE Strength RLE Overall Strength: Deficits Right Hip Flexion: 3-/5 Right Knee Flexion: 3-/5 Right Knee Extension: 4+/5 Right Ankle Dorsiflexion: 4/5 Right Ankle Plantar Flexion: 2+/5 LLE Assessment LLE Assessment: Within Functional Limits General Strength Comments: Grossly 5/5 throughout in sitting  Care Tool Care Tool Bed Mobility Roll left and right activity   Roll left and right assist level: Supervision/Verbal cueing    Sit to lying activity   Sit to lying assist level: Supervision/Verbal cueing    Lying to sitting on side of bed activity   Lying to sitting on side of bed assist  level: the ability to move from lying on the back to sitting on the side of the bed with no back support.: Supervision/Verbal cueing     Care Tool Transfers Sit to stand transfer   Sit to stand assist level: Minimal Assistance - Patient > 75%    Chair/bed transfer   Chair/bed transfer assist level: Minimal Assistance - Patient > 75%     Toilet transfer   Assist Level: Minimal Assistance - Patient > 75%    Car transfer   Car transfer assist level: Minimal Assistance - Patient > 75%      Care Tool Locomotion Ambulation   Assist level: Minimal Assistance - Patient > 75% Assistive device: Hand held assist Max distance: 18 ft  Walk 10 feet activity   Assist level: Minimal Assistance - Patient > 75% Assistive device: Hand held assist   Walk 50 feet with 2 turns activity Walk 50 feet with 2 turns activity did not occur: Safety/medical concerns       Walk 150 feet activity Walk 150 feet activity did not occur: Safety/medical concerns      Walk 10 feet on uneven surfaces activity Walk 10 feet on uneven surfaces activity did not occur: Safety/medical concerns      Stairs Stair activity did not occur: Safety/medical concerns        Walk up/down 1 step activity Walk up/down 1 step or curb (drop down) activity did not occur: Safety/medical concerns      Walk up/down 4 steps activity Walk up/down 4 steps activity did not occur: Safety/medical concerns      Walk up/down 12 steps activity Walk up/down 12 steps activity did not occur: Safety/medical concerns      Pick up small objects from floor Pick up small object from the floor (from standing position) activity did not occur: Safety/medical concerns      Wheelchair Is the patient using a wheelchair?: Yes Type of Wheelchair: Manual   Wheelchair assist level: Total Assistance - Patient < 25% Max wheelchair distance: 6 feet, unable to attend or motor plan with max cues  Wheel 50 feet with 2 turns activity   Assist Level: Total Assistance - Patient < 25%  Wheel 150 feet activity   Assist Level: Total Assistance - Patient < 25%    Refer to Care Plan for Long Term Goals  SHORT TERM GOAL WEEK 1 PT Short Term Goal 1 (Week 1): Patient will perform basic transfer with CGA using LRAD. PT Short Term Goal 2 (Week 1): Patient will ambulate >100 feet with min A using LRAD. PT Short Term Goal 3 (Week 1): Patient will initiate stair training  Recommendations for other services: None   Skilled Therapeutic Intervention Evaluation completed (see details above and below) with education on PT POC and goals and individual treatment initiated with focus on functional mobility/transfers, LE strength, dynamic standing balance/coordination, ambulation, stair navigation, simulated car transfers, and improved endurance with activity Patient provided with 18x18 wheelchair with hybrid Roho/foam cushion  and adjustments made to promote optimal seating posture and pressure distribution. Patient also provided with RW for use in room and therapist adjusted to proper height for patient.  Patient in bed with her daughter in the room upon PT arrival. Patient alert and agreeable to PT session. Patient denied pain during session.  Patient with sudden incontinence during car transfer and while washing her hands at the sink and transported back to the room and to the bathroom. Patient and her daughter report that she was  having incontinence and urgency PTA for ~1 year, however, it has become more frequent since her hospitalization.   Therapeutic Activity: Bed Mobility: Patient performed rolling R/L and supine to/from sit with supervision in a flat bed without use of bed rails.  Transfers: Patient performed sit to/from stand and stand pivot w/c<>toilet with min A with HHA. Provided verbal cues for forward weight shift and completing turn to sit for safety. Patient was incontinent of bladder x2 during session. Performed peri-care with mod A and changed pants, brief, and socks with max A for energy/time management and brief a second time with max A. Performed standing balance during lower body clothing management with CGA using grab bar and min A at the sink to perform hand hygiene.  Patient performed a simulated sedan height car transfer with min A HHA using step-in technique. Provided cues for safe technique.  Gait Training:  Patient ambulated 8 feet and 18 feet using HHA with min A. Ambulated with decreased gait speed, decreased step length and height R>L, increased B hip and knee flexion in stance, forward trunk lean, and downward head gaze. Provided verbal cues for erect posture, looking ahead, and facilitation for L weight shift for increased R step hieght.  Wheelchair Mobility:  Patient propelled wheelchair 6 feet with CGA and max cues for attention and motor planning of task, patient unable to effectively  follow cues and required dependent w/c transport throughout session.   Instructed pt in results of PT evaluation as detailed above, PT POC, rehab potential, rehab goals, and discharge recommendations. Additionally discussed CIR's policies regarding fall safety and use of chair alarm and/or quick release belt. Pt verbalized understanding and in agreement. Will update pt's family members as they become available.   Patient in bed with her daughter in the room at end of session with breaks locked, bed alarm set, and all needs within reach.    Discharge Criteria: Patient will be discharged from PT if patient refuses treatment 3 consecutive times without medical reason, if treatment goals not met, if there is a change in medical status, if patient makes no progress towards goals or if patient is discharged from hospital.  The above assessment, treatment plan, treatment alternatives and goals were discussed and mutually agreed upon: by family  Doreene Burke PT, DPT  01/24/2021, 4:52 PM

## 2021-01-24 NOTE — Telephone Encounter (Signed)
Requested medication (s) are due for refill today: No  Requested medication (s) are on the active medication list: Yes  Last refill:  11/28/2020  Future visit scheduled: Yes  Notes to clinic:  Pharmacy requesting year supply.      Requested Prescriptions  Pending Prescriptions Disp Refills   lisinopril (ZESTRIL) 40 MG tablet [Pharmacy Med Name: Lisinopril 40 MG Oral Tablet] 90 tablet 3    Sig: TAKE 1 TABLET BY MOUTH  DAILY     Cardiovascular:  ACE Inhibitors Passed - 01/23/2021 11:13 PM      Passed - Cr in normal range and within 180 days    Creatinine, Ser  Date Value Ref Range Status  01/24/2021 0.85 0.44 - 1.00 mg/dL Final          Passed - K in normal range and within 180 days    Potassium  Date Value Ref Range Status  01/24/2021 4.0 3.5 - 5.1 mmol/L Final          Passed - Patient is not pregnant      Passed - Last BP in normal range    BP Readings from Last 1 Encounters:  01/24/21 (!) 116/49          Passed - Valid encounter within last 6 months    Recent Outpatient Visits           1 month ago Essential hypertension   [facility] Juline Patch, MD   6 months ago Cognitive change   [facility] Juline Patch, MD   6 months ago Cognitive change   [facility] Juline Patch, MD   9 months ago Essential hypertension   [facility] Juline Patch, MD   11 months ago Essential hypertension   [place], Deanna C, MD       Future Appointments             In 2 months Juline Patch, MD [facility], [facility]

## 2021-01-24 NOTE — Evaluation (Signed)
Occupational Therapy Assessment and Plan  Patient Details  Name: Ann Chaney MRN: [id number] Date of Birth: 22-Mar-1946  OT Diagnosis: cognitive deficits, hemiplegia affecting dominant side, and muscle weakness (generalized) Rehab Potential: Rehab Potential (ACUTE ONLY): Excellent ELOS: 12-14   Today's Date: 01/24/2021 OT Individual Time: 6440-3474 OT Individual Time Calculation (min): 13 min     Hospital Problem: Principal Problem:   S/P resection of meningioma   Past Medical History:  Past Medical History:  Diagnosis Date   Diabetes mellitus without complication ([place])    Glaucoma    Glaucoma    Hyperlipidemia    Hypertension    Past Surgical History:  Past Surgical History:  Procedure Laterality Date   ABDOMINAL HYSTERECTOMY     CRANIOTOMY Left 01/14/2021   Procedure: Craniotomy - left - Frontal - Tumor;  Surgeon: Earnie Larsson, MD;  Location: [facility];  Service: Neurosurgery;  Laterality: Left;   HYSTEROTOMY      Assessment & Plan Clinical Impression: Patient is a 75 y.o. year old female with recent admission to the hospital on with history or progressive memory and cognitive issues. Screened for possible Alzheimer's disease and MRI scan performed that revealed large frontal parasagittal extra-axial tumor consistent with meningioma with significant mass effect. Patient without evidence of history of metastatic disease. Presented on 10/24/200 for craniotomy and resection of tumor. Patient transferred to CIR on 01/23/2021 .    Patient currently requires min with basic self-care skills secondary to muscle weakness, unbalanced muscle activation, decreased coordination, and decreased motor planning, decreased initiation, decreased awareness, decreased problem solving, decreased memory, and delayed processing, and decreased standing balance, decreased postural control, and decreased balance strategies.  Prior to hospitalization, patient could complete BADL with independent .  Patient  will benefit from skilled intervention to increase independence with basic self-care skills prior to discharge home with care partner.  Anticipate patient will require 24 hour supervision and follow up home health.  OT - End of Session Endurance Deficit: Yes Endurance Deficit Description: Rest break within BADL tasks OT Assessment Rehab Potential (ACUTE ONLY): Excellent OT Patient demonstrates impairments in the following area(s): Balance;Cognition;Endurance;Motor;Safety OT Basic ADL's Functional Problem(s): Eating;Grooming;Bathing;Dressing;Toileting OT Transfers Functional Problem(s): Tub/Shower;Toilet OT Additional Impairment(s): Fuctional Use of Upper Extremity OT Plan OT Intensity: Minimum of 1-2 x/day, 45 to 90 minutes OT Frequency: 5 out of 7 days OT Duration/Estimated Length of Stay: 12-14 OT Treatment/Interventions: Balance/vestibular training;Cognitive remediation/compensation;Community reintegration;Discharge planning;Disease mangement/prevention;DME/adaptive equipment instruction;Functional electrical stimulation;Functional mobility training;Neuromuscular re-education;Pain management;Patient/family education;Psychosocial support;Self Care/advanced ADL retraining;Therapeutic Exercise;Therapeutic Activities;UE/LE Strength taining/ROM;UE/LE Coordination activities OT Self Feeding Anticipated Outcome(s): Supervision OT Basic Self-Care Anticipated Outcome(s): Supervision OT Toileting Anticipated Outcome(s): Supervision OT Bathroom Transfers Anticipated Outcome(s): Supervision OT Recommendation Patient destination: Home Follow Up Recommendations: Home health OT Equipment Recommended: To be determined   OT Evaluation Precautions/Restrictions  Precautions Precautions: Fall Precaution Comments: R hemiparesis, aphasia Restrictions Weight Bearing Restrictions: No Pain  Denies pain Home Living/Prior Functioning Home Living Family/patient expects to be discharged to:: Private  residence Living Arrangements: Alone Available Help at Discharge: Family, Available 24 hours/day (daughter [name] plans to stay with pt) Type of Home: House Home Access: Level entry Home Layout: One level Bathroom Shower/Tub: Chiropodist: Standard Bathroom Accessibility: Yes Additional Comments: Has a dog names [place]  Lives With: Alone IADL History Homemaking Responsibilities: Yes IADL Comments: Need to clarify with daughter. Pt states she was driving and doing all iADLs/BADLs prior to surgery Prior Function Level of Independence: Independent with basic ADLs, Independent with homemaking with  ambulation Vision Baseline Vision/History: 3 Glaucoma;1 Wears glasses Ability to See in Adequate Light: 1 Impaired Patient Visual Report: No change from baseline (PER PT REPORT) Vision Assessment?: No apparent visual deficits (none noted on eval) Perception  Perception: Within Functional Limits  Cognition Overall Cognitive Status: Impaired/Different from baseline Arousal/Alertness: Awake/alert Orientation Level: Person (unable to state place or situation, oriented to time and person) Year: 2022 Month: November Day of Week: Correct Memory: Impaired Immediate Memory Recall: Sock;Blue;Bed Memory Recall Sock: With Cue Memory Recall Blue: With Cue Memory Recall Bed: Not able to recall Attention: Sustained Awareness: Impaired Problem Solving: Impaired Safety/Judgment: Impaired Sensation Sensation Light Touch: Appears Intact Coordination Gross Motor Movements are Fluid and Coordinated: No Fine Motor Movements are Fluid and Coordinated: No Coordination and Movement Description: decreased smoothness and accuracy with R UE Finger Nose Finger Test: dysmetria with R Motor  Motor Motor: Hemiplegia Motor - Skilled Clinical Observations: R hemiplegia, slow processing  Balance Balance Balance Assessed: Yes Static Sitting Balance Static Sitting - Balance Support: Feet  supported Static Sitting - Level of Assistance: 5: Stand by assistance Dynamic Sitting Balance Dynamic Sitting - Balance Support: During functional activity Dynamic Sitting - Level of Assistance: 5: Stand by assistance Static Standing Balance Static Standing - Balance Support: During functional activity Static Standing - Level of Assistance: 4: Min assist Dynamic Standing Balance Dynamic Standing - Balance Support: During functional activity Dynamic Standing - Level of Assistance: 4: Min assist;3: Mod assist Extremity/Trunk Assessment RUE Assessment RUE Assessment: Exceptions to [facility] General Strength Comments: 3+/5 overall LUE Assessment LUE Assessment: Within Functional Limits  Care Tool Care Tool Self Care Eating        Oral Care    Oral Care Assist Level: Minimal Assistance - Patient > 75%    Bathing   Body parts bathed by patient: Right arm;Chest;Left arm;Abdomen;Front perineal area;Buttocks;Left upper leg;Face;Right lower leg;Left lower leg;Right upper leg     Assist Level: Minimal Assistance - Patient > 75%    Upper Body Dressing(including orthotics)   What is the patient wearing?: Pull over shirt   Assist Level: Minimal Assistance - Patient > 75%    Lower Body Dressing (excluding footwear)   What is the patient wearing?: Pants;Incontinence brief Assist for lower body dressing: Moderate Assistance - Patient 50 - 74%    Putting on/Taking off footwear   What is the patient wearing?: Non-skid slipper socks Assist for footwear: Minimal Assistance - Patient > 75%       Care Tool Toileting Toileting activity   Assist for toileting: Minimal Assistance - Patient > 75%      Care Tool Transfers Sit to stand transfer   Sit to stand assist level: Minimal Assistance - Patient > 75%    Chair/bed transfer   Chair/bed transfer assist level: Minimal Assistance - Patient > 75%     Toilet transfer   Assist Level: Minimal Assistance - Patient > 75%     Care Tool  Cognition  Expression of Ideas and Wants Expression of Ideas and Wants: 3. Some difficulty - exhibits some difficulty with expressing needs and ideas (e.g, some words or finishing thoughts) or speech is not clear  Understanding Verbal and Non-Verbal Content Understanding Verbal and Non-Verbal Content: 3. Usually understands - understands most conversations, but misses some part/intent of message. Requires cues at times to understand   Memory/Recall Ability Memory/Recall Ability : Location of own room;Current season   Refer to Care Plan for [place] 1  OT Short Term Goal 1 (Week 1): Patient will complete toilet transfer with CGA OT Short Term Goal 2 (Week 1): Pt will complete all steps of toothbrushing task with only min cues for sequencing OT Short Term Goal 3 (Week 1): Patient will complete LB ADLs with CGA  Recommendations for other services: None    Skilled Therapeutic Intervention Pt greeted semi-reclined in bed and agreeable to OT eval and treatment session.  OT eval completed addressing rehab process, OT purpose, POC, ELOS, and goals.  Pt ambulated to the bathroom with min/mod A due to balance deficits and R sided weakness. Pt voided bladder and completed peri-care with min A, although she had already been incontinent of urine in brief. BADL's completed sit<>stand from wc at the sink with min A for UB and mod A for LB. Pt needed cues for initiation and sequencing within grooming task. She orally held the water in her mouth after swishing and needed increased time and moderate cues to spit out water. Pt slow to process commends throughout session. Pt with mild R hemiplegia, cognitive, and balance deficits affecting BADL performance.  ADL ADL Grooming: Minimal assistance;Moderate cueing Upper Body Bathing: Minimal assistance;Moderate cueing Lower Body Bathing: Minimal assistance;Moderate cueing Upper Body Dressing: Minimal assistance;Moderate cueing Lower Body  Dressing: Moderate assistance;Moderate cueing Toileting: Minimal assistance Toilet Transfer: Minimal assistance Toilet Transfer Method: Stand pivot Tub/Shower Transfer: Unable to assess Mobility  Transfers Sit to Stand: Minimal Assistance - Patient > 75% Stand to Sit: Minimal Assistance - Patient > 75%   Discharge Criteria: Patient will be discharged from OT if patient refuses treatment 3 consecutive times without medical reason, if treatment goals not met, if there is a change in medical status, if patient makes no progress towards goals or if patient is discharged from hospital.  The above assessment, treatment plan, treatment alternatives and goals were discussed and mutually agreed upon: by patient  Valma Cava 01/24/2021, 12:52 PM

## 2021-01-24 NOTE — Progress Notes (Signed)
Inpatient Rehabilitation  Patient information reviewed and entered into eRehab system by Myrle Wanek M. Vennie Salsbury, M.A., CCC/SLP, PPS Coordinator.  Information including medical coding, functional ability and quality indicators will be reviewed and updated through discharge.    

## 2021-01-24 NOTE — Plan of Care (Signed)
  Problem: RH Balance Goal: LTG: Patient will maintain dynamic sitting balance (OT) Description: LTG:  Patient will maintain dynamic sitting balance with assistance during activities of daily living (OT) Flowsheets (Taken 01/24/2021 0948) LTG: Pt will maintain dynamic sitting balance during ADLs with: Supervision/Verbal cueing Goal: LTG Patient will maintain dynamic standing with ADLs (OT) Description: LTG:  Patient will maintain dynamic standing balance with assist during activities of daily living (OT)  Flowsheets (Taken 01/24/2021 0948) LTG: Pt will maintain dynamic standing balance during ADLs with: Supervision/Verbal cueing   Problem: Sit to Stand Goal: LTG:  Patient will perform sit to stand in prep for activites of daily living with assistance level (OT) Description: LTG:  Patient will perform sit to stand in prep for activites of daily living with assistance level (OT) Flowsheets (Taken 01/24/2021 0948) LTG: PT will perform sit to stand in prep for activites of daily living with assistance level: Supervision/Verbal cueing   Problem: RH Eating Goal: LTG Patient will perform eating w/assist, cues/equip (OT) Description: LTG: Patient will perform eating with assist, with/without cues using equipment (OT) Flowsheets (Taken 01/24/2021 0948) LTG: Pt will perform eating with assistance level of: Supervision/Verbal cueing   Problem: RH Grooming Goal: LTG Patient will perform grooming w/assist,cues/equip (OT) Description: LTG: Patient will perform grooming with assist, with/without cues using equipment (OT) Flowsheets (Taken 01/24/2021 0948) LTG: Pt will perform grooming with assistance level of: Supervision/Verbal cueing   Problem: RH Bathing Goal: LTG Patient will bathe all body parts with assist levels (OT) Description: LTG: Patient will bathe all body parts with assist levels (OT) Flowsheets (Taken 01/24/2021 0948) LTG: Pt will perform bathing with assistance level/cueing:  Supervision/Verbal cueing   Problem: RH Dressing Goal: LTG Patient will perform upper body dressing (OT) Description: LTG Patient will perform upper body dressing with assist, with/without cues (OT). Flowsheets (Taken 01/24/2021 0948) LTG: Pt will perform upper body dressing with assistance level of: Supervision/Verbal cueing Goal: LTG Patient will perform lower body dressing w/assist (OT) Description: LTG: Patient will perform lower body dressing with assist, with/without cues in positioning using equipment (OT) Flowsheets (Taken 01/24/2021 0948) LTG: Pt will perform lower body dressing with assistance level of: Supervision/Verbal cueing   Problem: RH Functional Use of Upper Extremity Goal: LTG Patient will use RT/LT upper extremity as a (OT) Description: LTG: Patient will use right/left upper extremity as a stabilizer/gross assist/diminished/nondominant/dominant level with assist, with/without cues during functional activity (OT) Flowsheets (Taken 01/24/2021 0948) LTG: Use of upper extremity in functional activities: RUE as nondominant level LTG: Pt will use upper extremity in functional activity with assistance level of: Supervision/Verbal cueing   Problem: RH Toilet Transfers Goal: LTG Patient will perform toilet transfers w/assist (OT) Description: LTG: Patient will perform toilet transfers with assist, with/without cues using equipment (OT) Flowsheets (Taken 01/24/2021 0948) LTG: Pt will perform toilet transfers with assistance level of: Supervision/Verbal cueing   Problem: RH Tub/Shower Transfers Goal: LTG Patient will perform tub/shower transfers w/assist (OT) Description: LTG: Patient will perform tub/shower transfers with assist, with/without cues using equipment (OT) Flowsheets (Taken 01/24/2021 0948) LTG: Pt will perform tub/shower stall transfers with assistance level of: Supervision/Verbal cueing   Problem: RH Awareness Goal: LTG: Patient will demonstrate awareness during  functional activites type of (OT) Description: LTG: Patient will demonstrate awareness during functional activites type of (OT) Flowsheets (Taken 01/24/2021 0948) LTG: Patient will demonstrate awareness during functional activites type of (OT): Supervision

## 2021-01-24 NOTE — Telephone Encounter (Signed)
Rx written 11/28/2020 #90 with 1 refill.

## 2021-01-25 LAB — GLUCOSE, CAPILLARY
Glucose-Capillary: 140 mg/dL — ABNORMAL HIGH (ref 70–99)
Glucose-Capillary: 135 mg/dL — ABNORMAL HIGH (ref 70–99)
Glucose-Capillary: 139 mg/dL — ABNORMAL HIGH (ref 70–99)
Glucose-Capillary: 130 mg/dL — ABNORMAL HIGH (ref 70–99)

## 2021-01-25 MED ORDER — DEXAMETHASONE 0.5 MG PO TABS
1.0000 mg | ORAL_TABLET | Freq: Every day | ORAL | Status: AC
  Administered 2021-01-26 – 2021-01-27 (×2): 1 mg via ORAL
  Filled 2021-01-25 (×2): qty 2

## 2021-01-25 NOTE — Care Management (Signed)
Inpatient [facility] Individual Statement of Services  Patient Name:  Ann Chaney  Date:  01/25/2021  Welcome to the [facility].  Our goal is to provide you with an individualized program based on your diagnosis and situation, designed to meet your specific needs.  With this comprehensive rehabilitation program, you will be expected to participate in at least 3 hours of rehabilitation therapies Monday-Friday, with modified therapy programming on the weekends.  Your rehabilitation program will include the following services:  Physical Therapy (PT), Occupational Therapy (OT), Speech Therapy (ST), 24 hour per day rehabilitation nursing, Therapeutic Recreaction (TR), Psychology, Neuropsychology, Care Coordinator, Rehabilitation Medicine, [place], and Other  Weekly team conferences will be held on Tuesdays to discuss your progress.  Your Inpatient Rehabilitation Care Coordinator will talk with you frequently to get your input and to update you on team discussions.  Team conferences with you and your family in attendance may also be held.  Expected length of stay: 12-14 days    Overall anticipated outcome: Supervision  Depending on your progress and recovery, your program may change. Your Inpatient Rehabilitation Care Coordinator will coordinate services and will keep you informed of any changes. Your Inpatient Rehabilitation Care Coordinator's name and contact numbers are listed  below.  The following services may also be recommended but are not provided by the [facility] will be made to provide these services after discharge if needed.  Arrangements include referral to agencies that provide these services.  Your insurance has been verified to be:  [facility] Medicare  Your primary  doctor is:  Otilio Miu  Pertinent information will be shared with your doctor and your insurance company.  Inpatient Rehabilitation Care Coordinator:  Cathleen Corti [phone] or ([phone]  Information discussed with and copy given to patient by: Rana Snare, 01/25/2021, 9:01 AM

## 2021-01-25 NOTE — Progress Notes (Signed)
Speech Language Pathology Daily Session Note  Patient Details  Name: TNIA ANGLADA MRN: [id number] Date of Birth: 04-02-1945  Today's Date: 01/25/2021 SLP Individual Time: 1345-1430 SLP Individual Time Calculation (min): 45 min  Short Term Goals: Week 1: SLP Short Term Goal 1 (Week 1): Patient will participate in further cognitive-linguistic and language assessments to further identify functional needs SLP Short Term Goal 2 (Week 1): Patient will respond to biographical and/or environmental yes/no questions during 70% of occasions with mod A verbal/visual cues SLP Short Term Goal 3 (Week 1): Patient will express basic wants/needs/ideas after initial cue with mod A cues SLP Short Term Goal 4 (Week 1): Patient will follow 1-step commands with mod A verbal cues SLP Short Term Goal 5 (Week 1): Patient will attend to basic, familiar tasks for 3 minute intervals with mod A verbal cues for redirection SLP Short Term Goal 6 (Week 1): Patient will demonstrate orientation x4 with mod A cues for use of external aids  Skilled Therapeutic Interventions:   Patient seen with daughter present in room for skilled ST session focusing on cognitive function goals. Patient was sitting in [facility] when SLP arrived and was receptive towards therapy. She was oriented to day of week, month, year but not date. She required varying levels of verbal and visual cues (mod-maxA) to utilize visual aids/cues to answer questions such as name of hospital, date, etc. When answering open-ended questions related to therapy exercises, daily activities, she exhibited a delay, would smile and at times, look inquisitively at SLP or her daughter. She required cues at times to verbally respond, such as when SLP asked her why she was in hospital and she then gestured to her scar on head from brain surgery. When questioned further, she was able to state, "men-meningioma". Daughter did report that patient has seemed more timely with responses today  as compared to recent past. SLP provided patient with calendar and encouraged her and daughter to write down things she does during day to help with memory and with awareness to progress. Patient left in [facility] with alarm belt placed and activated and daughter in room. She continues to benefit from skilled SLP intervention to maximize cognitive function goals prior to discharge.  Pain Pain Assessment Pain Scale: 0-10 Pain Score: 0-No pain  Therapy/Group: Individual Therapy  Sonia Baller, MA, CCC-SLP Speech Therapy

## 2021-01-25 NOTE — IPOC Note (Signed)
Overall Plan of Care [facility]) Patient Details Name: Ann Chaney MRN: [id number] DOB: 05-28-1945  Admitting Diagnosis: S/P resection of meningioma  Hospital Problems: Principal Problem:   S/P resection of meningioma     Functional Problem List: Nursing Bladder, Bowel, Endurance, Medication Management, Perception, Safety, Sensory, Skin Integrity  PT Balance, Perception, Safety, Behavior, Edema, Skin Integrity, Endurance, Motor, Nutrition, Pain  OT Balance, Cognition, Endurance, Motor, Safety  SLP Behavior, Cognition, Linguistic  TR         Basic ADL's: OT Eating, Grooming, Bathing, Dressing, Toileting     Advanced  ADL's: OT       Transfers: PT Bed Mobility, Bed to Chair, Car, Patent attorney, Agricultural engineer: PT Ambulation, Emergency planning/management officer, Stairs     Additional Impairments: OT Fuctional Use of Upper Extremity  SLP Communication, Social Cognition comprehension, expression Social Interaction, Memory, Attention, Awareness, Problem Solving  TR      Anticipated Outcomes Item Anticipated Outcome  Self Feeding Supervision  Swallowing      Basic self-care  Supervision  Toileting  Supervision   Bathroom Transfers Supervision  Bowel/Bladder  min assist  Transfers  supervision using LRAD  Locomotion  supervision using LRAD  Communication  min A  Cognition  min A  Pain  n/a  Safety/Judgment  min assist and no falls   Therapy Plan: PT Intensity: Minimum of 1-2 x/day ,45 to 90 minutes PT Frequency: 5 out of 7 days PT Duration Estimated Length of Stay: 12-14 days OT Intensity: Minimum of 1-2 x/day, 45 to 90 minutes OT Frequency: 5 out of 7 days OT Duration/Estimated Length of Stay: 12-14 SLP Intensity: Minumum of 1-2 x/day, 30 to 90 minutes SLP Frequency: 3 to 5 out of 7 days SLP Duration/Estimated Length of Stay: ~2 weeks   Due to the current state of emergency, patients may not be receiving their 3-hours of Medicare-mandated  therapy.   Team Interventions: Nursing Interventions Patient/Family Education, Bladder Management, Bowel Management, Disease Management/Prevention, Medication Management, Skin Care/Wound Management, Cognitive Remediation/Compensation, Discharge Planning, Psychosocial Support  PT interventions Ambulation/gait training, Cognitive remediation/compensation, Discharge planning, Functional mobility training, DME/adaptive equipment instruction, Pain management, Psychosocial support, Splinting/orthotics, Therapeutic Activities, UE/LE Strength taining/ROM, Wheelchair propulsion/positioning, UE/LE Coordination activities, Visual/perceptual remediation/compensation, Therapeutic Exercise, Stair training, Skin care/wound management, Patient/family education, Neuromuscular re-education, Functional electrical stimulation, Disease management/prevention, Academic librarian, Training and development officer  OT Interventions Training and development officer, Cognitive remediation/compensation, Academic librarian, Discharge planning, Disease mangement/prevention, Engineer, drilling, Functional electrical stimulation, Functional mobility training, Neuromuscular re-education, Pain management, Patient/family education, Psychosocial support, Self Care/advanced ADL retraining, Therapeutic Exercise, Therapeutic Activities, UE/LE Strength taining/ROM, UE/LE Coordination activities  SLP Interventions Cognitive remediation/compensation, Multimodal communication approach, Internal/external aids, Cueing hierarchy, Functional tasks, Patient/family education, Therapeutic Activities  TR Interventions    SW/CM Interventions Discharge Planning, Patient/Family Education, Psychosocial Support   Barriers to Discharge MD  Medical stability  Nursing Decreased caregiver support, Incontinence, Wound Care, Lack of/limited family support, Medication compliance Discharging to 1 level home with level entry. Daughter [name] is moving in  with patient. Daughter is retired and can provide 24/7 care.  PT Home environment access/layout, Incontinence, Behavior    OT      SLP      SW Decreased caregiver support, Lack of/limited family support     Team Discharge Planning: Destination: PT-Home ,OT- Home , SLP-Home Projected Follow-up: PT-Outpatient PT, OT-  Home health OT, SLP-24 hour supervision/assistance, Home Health SLP Projected Equipment Needs: PT-To be determined, OT- To be determined, SLP-None recommended  by SLP Equipment Details: PT- , OT-  Patient/family involved in discharge planning: PT- Family member/caregiver,  OT-Patient, SLP-Patient, Family member/caregiver  MD ELOS: 12-14 days Medical Rehab Prognosis:  Excellent Assessment: The patient has been admitted for CIR therapies with the diagnosis of functional deficits after meningioma/resection. The team will be addressing functional mobility, strength, stamina, balance, safety, adaptive techniques and equipment, self-care, bowel and bladder mgt, patient and caregiver education, NMR, cognition, communication, community reentry. Goals have been set at supervision for basic mobility and self-care and min assist for cognition.   Due to the current state of emergency, patients may not be receiving their 3 hours per day of Medicare-mandated therapy.    Meredith Staggers, MD, FAAPMR     See Team Conference Notes for weekly updates to the plan of care

## 2021-01-25 NOTE — Progress Notes (Signed)
PROGRESS NOTE   Subjective/Complaints: Pt in bed. No complaints this morning. Appears to have had an uneventful night  ROS: Limited due to cognitive/behavioral    Objective:   No results found. Recent Labs    01/24/21 0522  WBC 7.0  HGB 9.7*  HCT 29.8*  PLT 305   Recent Labs    01/24/21 0522  NA 136  K 4.0  CL 103  CO2 27  GLUCOSE 128*  BUN 19  CREATININE 0.85  CALCIUM 9.0    Intake/Output Summary (Last 24 hours) at 01/25/2021 1038 Last data filed at 01/25/2021 0813 Gross per 24 hour  Intake 480 ml  Output --  Net 480 ml        Physical Exam: Vital Signs Blood pressure (!) 112/54, pulse 60, temperature 99 F (37.2 C), temperature source Oral, resp. rate 14, height 5\' 1"  (1.549 m), weight 52.4 kg, SpO2 100 %.  Constitutional: No distress . Vital signs reviewed. HEENT: NCAT, EOMI, oral membranes moist Neck: supple Cardiovascular: RRR without murmur. No JVD    Respiratory/Chest: CTA Bilaterally without wheezes or rales. Normal effort    GI/Abdomen: BS +, non-tender, non-distended Ext: no clubbing, cyanosis, or edema Psych: pleasant but a little flat, delayed Skin: crani incision over fronto-parietal scalp Neuro:  Pt remains oriented to person, month. Not place or why she's here. Doesn't recall what she did yesterday. Processing delays. Normal language. Limited insight and awareness. Moves all 4 limbs Musculoskeletal: fair posture. No pain    Assessment/Plan: 1. Functional deficits which require 3+ hours per day of interdisciplinary therapy in a comprehensive inpatient rehab setting. Physiatrist is providing close team supervision and 24 hour management of active medical problems listed below. Physiatrist and rehab team continue to assess barriers to discharge/monitor patient progress toward functional and medical goals  Care Tool:  Bathing    Body parts bathed by patient: Right arm, Chest, Left  arm, Abdomen, Front perineal area, Buttocks, Left upper leg, Face, Right lower leg, Left lower leg, Right upper leg         Bathing assist Assist Level: Minimal Assistance - Patient > 75%     Upper Body Dressing/Undressing Upper body dressing   What is the patient wearing?: Pull over shirt    Upper body assist Assist Level: Minimal Assistance - Patient > 75%    Lower Body Dressing/Undressing Lower body dressing      What is the patient wearing?: Pants, Incontinence brief     Lower body assist Assist for lower body dressing: Moderate Assistance - Patient 50 - 74%     Toileting Toileting    Toileting assist Assist for toileting: Minimal Assistance - Patient > 75%     Transfers Chair/bed transfer  Transfers assist     Chair/bed transfer assist level: Minimal Assistance - Patient > 75%     Locomotion Ambulation   Ambulation assist      Assist level: Minimal Assistance - Patient > 75% Assistive device: Hand held assist Max distance: 18 ft   Walk 10 feet activity   Assist     Assist level: Minimal Assistance - Patient > 75% Assistive device: Hand held assist   Walk  50 feet activity   Assist Walk 50 feet with 2 turns activity did not occur: Safety/medical concerns         Walk 150 feet activity   Assist Walk 150 feet activity did not occur: Safety/medical concerns         Walk 10 feet on uneven surface  activity   Assist Walk 10 feet on uneven surfaces activity did not occur: Safety/medical concerns         Wheelchair     Assist Is the patient using a wheelchair?: Yes Type of Wheelchair: Manual    Wheelchair assist level: Total Assistance - Patient < 25% Max wheelchair distance: 6 feet, unable to attend or motor plan with max cues    Wheelchair 50 feet with 2 turns activity    Assist        Assist Level: Total Assistance - Patient < 25%   Wheelchair 150 feet activity     Assist      Assist Level: Total  Assistance - Patient < 25%   Blood pressure (!) 112/54, pulse 60, temperature 99 F (37.2 C), temperature source Oral, resp. rate 14, height 5\' 1"  (1.549 m), weight 52.4 kg, SpO2 100 %.  Medical Problem List and Plan: 1.  Impaired mobility following large parasagittal meningioma resection             -patient may shower               -ELOS/Goals: 10-14 days S  --Continue CIR therapies including PT, OT, and SLP  2.  Antithrombotics: -DVT/anticoagulation:  Mechanical: Sequential compression devices, below knee Bilateral lower extremities             -antiplatelet therapy: N/a 3. Pain Management: Continue hydrocodone prn.  4. Mood: LCSW to follow for evaluation and support.              -antipsychotic agents: N/A 5. Neuropsych: This patient is not fully capable of making decisions on her own behalf. 6. Skin/Wound Care: Routine pressure relief measures. Monitor incision for healing.  7. Fluids/Electrolytes/Nutrition: Monitor I/O.              --encourage PO I personally reviewed the patient's labs today.  .  8. HTN: Monitor BP TID- Continue Lisinopril and Toprol.              --11/4 BP/HR stable, HR sl brady 9. T2DM: Hgb A1C-6.2 and well controlled on Metformin, Actos and Glipizide             --Monitor BS ac/hs and use SSI for elevated BS.            CBG (last 3)  Recent Labs    01/24/21 1759 01/24/21 2120 01/25/21 0607  GLUCAP 148* 96 140*   -controlled 11/4 10. Goiter/Hyperthyroidism: Continue Methimazole.  11. Seizure pophylaxis: Has been seizure free >7 days since surgery. D/c Keppra.              --has been on decadron since 12/21/20--> decadron 1 mg bid.   11/4 -continue daily for 2 days then stop 12. Constipation: Will add Senna S at bedtime.  13. Hyponatremia: Na down to 132 on 10/25, from 137 12 days ago-->recheck in am.  14. Leucocytosis: resolved, d/t steroids   15. Acute blood loss anemia: Hgb 11.8 on 11.2, down from 13.1 12 days prior.   Hgb 9.7 11/3, likely  post-op, fluids. No clinical signs of blood loss -recheck Monday.  16. Open angle glaucoma:  Stable on Xalatan.  17. Insomnia:  continue low dose melatonin.     LOS: 2 days A FACE TO FACE EVALUATION WAS PERFORMED  Meredith Staggers 01/25/2021, 10:38 AM

## 2021-01-25 NOTE — Plan of Care (Signed)
  Problem: Consults Goal: RH BRAIN INJURY PATIENT EDUCATION Description: Description: See Patient Education module for eduction specifics Outcome: Progressing Goal: Skin Care Protocol Initiated - if Braden Score 18 or less Description: If consults are not indicated, leave blank or document N/A Outcome: Progressing Goal: Diabetes Guidelines if Diabetic/Glucose > 140 Description: If diabetic or lab glucose is > 140 mg/dl - Initiate Diabetes/Hyperglycemia Guidelines & Document Interventions  Outcome: Progressing   Problem: RH BOWEL ELIMINATION Goal: RH STG MANAGE BOWEL WITH ASSISTANCE Description: STG Manage Bowel with Min Assistance. Outcome: Progressing Goal: RH STG MANAGE BOWEL W/MEDICATION W/ASSISTANCE Description: STG Manage Bowel with Medication with [place]. Outcome: Progressing   Problem: RH BLADDER ELIMINATION Goal: RH STG MANAGE BLADDER WITH ASSISTANCE Description: STG Manage Bladder With Min Assistance Outcome: Progressing Goal: RH STG MANAGE BLADDER WITH MEDICATION WITH ASSISTANCE Description: STG Manage Bladder With Medication With [place]. Outcome: Progressing   Problem: RH SKIN INTEGRITY Goal: RH STG MAINTAIN SKIN INTEGRITY WITH ASSISTANCE Description: STG Maintain Skin Integrity With [place]. Outcome: Progressing Goal: RH STG ABLE TO PERFORM INCISION/WOUND CARE W/ASSISTANCE Description: STG Able To Perform Incision/Wound Care With [employer]. Outcome: Progressing   Problem: RH SAFETY Goal: RH STG ADHERE TO SAFETY PRECAUTIONS W/ASSISTANCE/DEVICE Description: STG Adhere to Safety Precautions With cues and reminders. Outcome: Progressing Goal: RH STG DECREASED RISK OF FALL WITH ASSISTANCE Description: STG Decreased Risk of Fall With [employer]. Outcome: Progressing   Problem: RH KNOWLEDGE DEFICIT BRAIN INJURY Goal: RH STG INCREASE KNOWLEDGE OF SELF CARE AFTER BRAIN INJURY Description: Patient will demonstrate knowledge of medication  management, diabetes management, skin/wound care with educational materials and handouts provided by staff independently at discharge. Outcome: Progressing

## 2021-01-25 NOTE — Progress Notes (Signed)
Occupational Therapy Session Note  Patient Details  Name: Ann Chaney MRN: [id number] Date of Birth: 10/28/1945  Today's Date: 01/25/2021 OT Individual Time: 1050-1200 OT Individual Time Calculation (min): 70 min   Short Term Goals: Week 1:  OT Short Term Goal 1 (Week 1): Patient will complete toilet transfer with CGA OT Short Term Goal 2 (Week 1): Pt will complete all steps of toothbrushing task with only min cues for sequencing OT Short Term Goal 3 (Week 1): Patient will complete LB ADLs with CGA  Skilled Therapeutic Interventions/Progress Updates:    Pt greeted seated in wc with daughter present and agreeable to OT treatment session. OT asked pt if she needed to go to the bathroom and pt stated "yes." Pt ambulated into bathroom with min HHA and verbal cues to locate toilet and sit on commode. Pt had already been incontinent of urine, but voided more in commode. Per discussed with daughter, pt had begun some urinary incontinence PTA and was using depends at home. Pt needed cues to initiate peri-care and completed with min A. PT then ambulated to shower bench with min HHA. Bathing completed seated on tub bench with min A and min verbal cues for thoroughness and to wash all body parts. Min A for balance when standing to wash buttocks in shower. Pt then completed dressing tasks from wc with min A for LB dressing and verbal cues for initiation and orientation of clothing. Pt brought down to therapy gym and completed UB there-ex using [employer] arm bike 5 minutes forward, then 5 minutes backwards. BITS memory activity using 3 word sequence of words and images. Pt able to get the first 2 words in order with min cues, but then had difficulty with 3 or more. Graded task to use numbers, but pt still had difficulty grasping concept despite cues. Used BITS for visual scanning activity in all 4 quadrants with pt slower to hit button son L side of screen.  Pt returned to room and left seated in wc with alarm  belt on, daughter present, awaiting lunch.   Therapy Documentation Precautions:  Precautions Precautions: Fall Precaution Comments: R hemiparesis, aphasia Restrictions Weight Bearing Restrictions: No Pain: Pain Assessment Pain Scale: 0-10 Pain Score: 0-No pain   Therapy/Group: Individual Therapy  Valma Cava 01/25/2021, 11:32 AM

## 2021-01-25 NOTE — Progress Notes (Signed)
Physical Therapy Session Note  Patient Details  Name: Ann Chaney MRN: [id number] Date of Birth: 01/17/1946  Today's Date: 01/25/2021 PT Individual Time: [phone] and 1315-1345 PT Individual Time Calculation (min): 54 min and 30 min  Short Term Goals: Week 1:  PT Short Term Goal 1 (Week 1): Patient will perform basic transfer with CGA using LRAD. PT Short Term Goal 2 (Week 1): Patient will ambulate >100 feet with min A using LRAD. PT Short Term Goal 3 (Week 1): Patient will initiate stair training  Skilled Therapeutic Interventions/Progress Updates:     Pt received supine in bed and agrees to therapy. No complaint of pain. PT cues for bridging technique to assist with donning scrub pants. Pt performs supine to sit with cues for sequencing and positioning. Stand pivot transfer to [facility] with minA and cues on sequencing. WC transport to gym for time management. Pt ambulates x170' with HHA on L, with PT providing CGA/minA and cues for upright gaze to improve posture and balance, increased stride length and increased gait speed to decrease risk for falls. Following seated rest break, pt ambulates x15' to Nustep without AD and with CGA. Pt completes Nustep for strength and endurance training as well as reciprocal coordination. Pt completes x10:00 at workload of 3 with cues to keep speed at ~40 steps per minute. Pt unable to maintain tempo, however, and averages ~30-35 steps per minute. Following, pt ambulates additional 175' without AD, with CGA/minA and cues for upright gaze and increasing stride length and gait speed to improve balance. Pt left seated in WC with alarm intact and all needs within reach.  2nd Session: Pt misses 15 minutes of skilled PT due to toileting with NT. Following, pt seated in [facility] and agreeable to therapy. No complaint of pain. WC transport to gym for time management. Pt completes x8 6" steps with bilateral hand rails and CGA, with cues for step sequencing and safety. Following  seated rest break, pt ambulates through obstacle course, tasked with stepping over 2 inch objects and stepping on airex mat to provide unreliable somatosensory input and challenge other balance systems. Pt requires minA for ambulating and frequently contacts 2 inch obstacles due to shuffling gait pattern and balance impairments. Pt completes twice with seated rest break. WC transport back to room. Pt left seated in WC with all needs within reach.  Therapy Documentation Precautions:  Precautions Precautions: Fall Precaution Comments: R hemiparesis, aphasia Restrictions Weight Bearing Restrictions: No   Therapy/Group: Individual Therapy  Breck Coons, PT, DPT 01/25/2021, 4:12 PM

## 2021-01-26 LAB — GLUCOSE, CAPILLARY
Glucose-Capillary: 159 mg/dL — ABNORMAL HIGH (ref 70–99)
Glucose-Capillary: 84 mg/dL (ref 70–99)
Glucose-Capillary: 80 mg/dL (ref 70–99)
Glucose-Capillary: 161 mg/dL — ABNORMAL HIGH (ref 70–99)

## 2021-01-26 NOTE — Progress Notes (Signed)
PROGRESS NOTE   Subjective/Complaints:  No issues overnite  Pt denies pains, no breathing issues   ROS: Limited due to cognitive/behavioral    Objective:   No results found. Recent Labs    01/24/21 0522  WBC 7.0  HGB 9.7*  HCT 29.8*  PLT 305    Recent Labs    01/24/21 0522  NA 136  K 4.0  CL 103  CO2 27  GLUCOSE 128*  BUN 19  CREATININE 0.85  CALCIUM 9.0     Intake/Output Summary (Last 24 hours) at 01/26/2021 1021 Last data filed at 01/26/2021 0845 Gross per 24 hour  Intake 720 ml  Output 200 ml  Net 520 ml         Physical Exam: Vital Signs Blood pressure (!) 138/57, pulse 65, temperature 98.5 F (36.9 C), temperature source Oral, resp. rate 14, height 5\' 1"  (1.549 m), weight 52.4 kg, SpO2 100 %.   General: No acute distress Mood and affect are appropriate Heart: Regular rate and rhythm no rubs murmurs or extra sounds Lungs: Clear to auscultation, breathing unlabored, no rales or wheezes Abdomen: Positive bowel sounds, soft nontender to palpation, nondistended Extremities: No clubbing, cyanosis, or edema Skin: No evidence of breakdown, no evidence of rash   Neuro:  Pt remains oriented to person, month. Not place or why she's here. Doesn't recall what she did yesterday. Processing delays. Normal language. Limited insight and awareness. 4/5 in BUE and BLE Musculoskeletal: fair posture. No pain    Assessment/Plan: 1. Functional deficits which require 3+ hours per day of interdisciplinary therapy in a comprehensive inpatient rehab setting. Physiatrist is providing close team supervision and 24 hour management of active medical problems listed below. Physiatrist and rehab team continue to assess barriers to discharge/monitor patient progress toward functional and medical goals  Care Tool:  Bathing    Body parts bathed by patient: Right arm, Chest, Left arm, Abdomen, Front perineal area,  Buttocks, Right upper leg, Left upper leg, Right lower leg, Left lower leg, Face         Bathing assist Assist Level: Contact Guard/Touching assist     Upper Body Dressing/Undressing Upper body dressing   What is the patient wearing?: Hospital gown only    Upper body assist Assist Level: Supervision/Verbal cueing    Lower Body Dressing/Undressing Lower body dressing      What is the patient wearing?: Hospital gown only     Lower body assist Assist for lower body dressing: Contact Guard/Touching assist     Toileting Toileting    Toileting assist Assist for toileting: Contact Guard/Touching assist     Transfers Chair/bed transfer  Transfers assist     Chair/bed transfer assist level: Contact Guard/Touching assist     Locomotion Ambulation   Ambulation assist      Assist level: Minimal Assistance - Patient > 75% Assistive device: Hand held assist Max distance: 18 ft   Walk 10 feet activity   Assist     Assist level: Minimal Assistance - Patient > 75% Assistive device: Hand held assist   Walk 50 feet activity   Assist Walk 50 feet with 2 turns activity did not occur: Safety/medical  concerns         Walk 150 feet activity   Assist Walk 150 feet activity did not occur: Safety/medical concerns         Walk 10 feet on uneven surface  activity   Assist Walk 10 feet on uneven surfaces activity did not occur: Safety/medical concerns         Wheelchair     Assist Is the patient using a wheelchair?: Yes Type of Wheelchair: Manual    Wheelchair assist level: Total Assistance - Patient < 25% Max wheelchair distance: 6 feet, unable to attend or motor plan with max cues    Wheelchair 50 feet with 2 turns activity    Assist        Assist Level: Total Assistance - Patient < 25%   Wheelchair 150 feet activity     Assist      Assist Level: Total Assistance - Patient < 25%   Blood pressure (!) 138/57, pulse 65,  temperature 98.5 F (36.9 C), temperature source Oral, resp. rate 14, height 5\' 1"  (1.549 m), weight 52.4 kg, SpO2 100 %.  Medical Problem List and Plan: 1.  Impaired mobility following large parasagittal meningioma resection             -patient may shower               -ELOS/Goals: 10-14 days S  --Continue CIR therapies including PT, OT, and SLP  2.  Antithrombotics: -DVT/anticoagulation:  Mechanical: Sequential compression devices, below knee Bilateral lower extremities             -antiplatelet therapy: N/a 3. Pain Management: Continue hydrocodone prn.  4. Mood: LCSW to follow for evaluation and support.              -antipsychotic agents: N/A 5. Neuropsych: This patient is not fully capable of making decisions on her own behalf. 6. Skin/Wound Care: Routine pressure relief measures. Monitor incision for healing.  7. Fluids/Electrolytes/Nutrition: Monitor I/O.              --encourage PO I personally reviewed the patient's labs today.  .  8. HTN: Monitor BP TID- Continue Lisinopril and Toprol.              --11/4 BP/HR stable, HR sl brady 9. T2DM: Hgb A1C-6.2 and well controlled on Metformin, Actos and Glipizide             --Monitor BS ac/hs and use SSI for elevated BS.            CBG (last 3)  Recent Labs    01/25/21 1622 01/25/21 2116 01/26/21 0649  GLUCAP 139* 130* 159*    -controlled 11/5 10. Goiter/Hyperthyroidism: Continue Methimazole.  11. Seizure pophylaxis: Has been seizure free >7 days since surgery. D/c Keppra.              --has been on decadron since 12/21/20--> decadron 1 mg bid.   11/4 -continue daily for 2 days then stop 12. Constipation: Will add Senna S at bedtime.  13. Hyponatremia: Na down to 132 on 10/25, from 137 12 days ago-->recheck in am.  14. Leucocytosis: resolved, d/t steroids   15. Acute blood loss anemia: Hgb 11.8 on 11.2, down from 13.1 12 days prior.   Hgb 9.7 11/3, likely post-op, fluids. No clinical signs of blood loss -recheck Monday.   16. Open angle glaucoma:  Stable on Xalatan.  17. Insomnia:  continue low dose melatonin.     LOS:  3 days A FACE TO FACE EVALUATION WAS PERFORMED  Charlett Blake 01/26/2021, 10:21 AM

## 2021-01-26 NOTE — Progress Notes (Signed)
Physical Therapy Session Note  Patient Details  Name: Ann Chaney MRN: [id number] Date of Birth: [birth year]  Today's Date: 01/26/2021 PT Individual Time: 1050-1200 PT Individual Time Calculation (min): 70 min   Short Term Goals: Week 1:  PT Short Term Goal 1 (Week 1): Patient will perform basic transfer with CGA using LRAD. PT Short Term Goal 2 (Week 1): Patient will ambulate >100 feet with min A using LRAD. PT Short Term Goal 3 (Week 1): Patient will initiate stair training  Skilled Therapeutic Interventions/Progress Updates:     Pt received seated in WC. Pt appears to be upset and reports that she is "pissed". When asked why she is upset, pt tells PT "you've been drinking". PT follows up with further questions about pt's mood but pt will not elaborate on why she is upset. RN informed of pt's presentation and aware that pt has bouts of confusion. Pt is, however, agreeable to therapy. No complaint of pain. WC transport to gym for time management. Pt ambulates x175' without AD, demonstrating improved stride length and gait speed relative to this therapist's previous session. Pt then ambulates through obstacle course, tasked with stepping over 4 inch hurdles. Pt routinely misjudges hurdles and attempts to step from too far away, contacting hurdle rather than stepping over. With increased time and cueing, pt is able to sequence step-overs correctly. PT provides minA for stability. Pt performs obstacle course multiple bouts. Pt then performs Nustep for strength and endurance training.  Pt completes x17:00 at workload of 4 with average steps per minute 30-35.  Pt performs TUG test, with following results 28.75 seconds, 24 seconds, and 20.1 seconds. Stand pivot back to [facility] with CGA and cues for sequencing and positioning. Left seated in WC with alarm intact and all needs within reach.  Therapy Documentation Precautions:  Precautions Precautions: Fall Precaution Comments: R hemiparesis,  aphasia Restrictions Weight Bearing Restrictions: No    Therapy/Group: Individual Therapy  Breck Coons, PT, DPT 01/26/2021, 4:11 PM

## 2021-01-26 NOTE — Progress Notes (Signed)
Speech Language Pathology Daily Session Note  Patient Details  Name: Ann Chaney MRN: [id number] Date of Birth: March 12, 1946  Today's Date: 01/26/2021 SLP Individual Time: 1300-1400 SLP Individual Time Calculation (min): 60 min  Short Term Goals: Week 1: SLP Short Term Goal 1 (Week 1): Patient will participate in further cognitive-linguistic and language assessments to further identify functional needs SLP Short Term Goal 2 (Week 1): Patient will respond to biographical and/or environmental yes/no questions during 70% of occasions with mod A verbal/visual cues SLP Short Term Goal 3 (Week 1): Patient will express basic wants/needs/ideas after initial cue with mod A cues SLP Short Term Goal 4 (Week 1): Patient will follow 1-step commands with mod A verbal cues SLP Short Term Goal 5 (Week 1): Patient will attend to basic, familiar tasks for 3 minute intervals with mod A verbal cues for redirection SLP Short Term Goal 6 (Week 1): Patient will demonstrate orientation x4 with mod A cues for use of external aids  Skilled Therapeutic Interventions: Pt seen for skilled ST with focus on cognitive goals, pt upright in chair with daughter present for most of session. Pt with fluctuating sustained attention this date that averaged <1 minute, externally distracted by papers on tray table. Pt independently oriented x4 without use of external aid. Pt able to name common pictures with 75% accuracy provided mod A cues and extra time. SLP facilitating simple conversation by asking biographical and environmental questions with mod A verbal cues for 60% accuracy. Pt observed ordering dinner, breakfast and lunch meals through food & nutrition services requiring significant extra time and heavy prompts for decision making. Pt left in wheelchair with daughter present and all needs met. Cont ST POC.   Pain Pain Assessment Pain Scale: 0-10 Pain Score: 0-No pain  Therapy/Group: Individual Therapy  Dewaine Conger 01/26/2021, 2:02 PM

## 2021-01-26 NOTE — Progress Notes (Signed)
Occupational Therapy Session Note  Patient Details  Name: Ann Chaney MRN: [id number] Date of Birth: [birth year]  Today's Date: 01/26/2021 OT Individual Time: 0258-5277 OT Individual Time Calculation (min): 55 min    Short Term Goals: Week 1:  OT Short Term Goal 1 (Week 1): Patient will complete toilet transfer with CGA OT Short Term Goal 2 (Week 1): Pt will complete all steps of toothbrushing task with only min cues for sequencing OT Short Term Goal 3 (Week 1): Patient will complete LB ADLs with CGA  Skilled Therapeutic Interventions/Progress Updates:    Pt received supine with no c/o pain, agreeable to OT session beginning with shower. Ambulatory transfer into the bathroom with CGA using the RW. Min cueing for initiation/sequencing undressing. Bathing at overall CGA- (S) level overall with appropriate use of grab bar for balance support. Pt returned to EOB, min A-CGA for ambulatory transfer without device. She donned pants and shirt with CGA. Socks set up assist. Standing level oral hygiene with close (S). Pt was taken to the therapy apt via w/c for time management. Here she completed several activities focused on functional reaching into high/low cabinets for simulation of home environment, as well as cognitive demand of processing multi step commands and sustained attention to task. Pt with slow/delayed processing and poor working/ST memory, requiring min-mod cueing for simple meal prep simulation. Pt was given edu re energy conservation strategies to use in the kitchen as well. She returned to her room and was left sitting up in the w/c with all needs met, chair alarm set.   Therapy Documentation Precautions:  Precautions Precautions: Fall Precaution Comments: R hemiparesis, aphasia Restrictions Weight Bearing Restrictions: No General:   Vital Signs: Therapy Vitals Temp: 98.5 F (36.9 C) Temp Source: Oral Pulse Rate: 65 Resp: 14 BP: (!) 138/57 Patient Position (if  appropriate): Lying Oxygen Therapy SpO2: 100 % O2 Device: Room Air Pain:   ADL: ADL Grooming: Minimal assistance, Moderate cueing Upper Body Bathing: Minimal assistance, Moderate cueing Lower Body Bathing: Minimal assistance, Moderate cueing Upper Body Dressing: Minimal assistance, Moderate cueing Lower Body Dressing: Moderate assistance, Moderate cueing Toileting: Minimal assistance Toilet Transfer: Minimal assistance Toilet Transfer Method: Stand pivot Tub/Shower Transfer: Unable to assess Vision   Perception    Praxis   Exercises:   Other Treatments:     Therapy/Group: Individual Therapy  Curtis Sites 01/26/2021, 7:20 AM

## 2021-01-26 NOTE — Plan of Care (Signed)
  Problem: Consults Goal: RH BRAIN INJURY PATIENT EDUCATION Description: Description: See Patient Education module for eduction specifics Outcome: Progressing Goal: Skin Care Protocol Initiated - if Braden Score 18 or less Description: If consults are not indicated, leave blank or document N/A Outcome: Progressing Goal: Diabetes Guidelines if Diabetic/Glucose > 140 Description: If diabetic or lab glucose is > 140 mg/dl - Initiate Diabetes/Hyperglycemia Guidelines & Document Interventions  Outcome: Progressing   Problem: RH BOWEL ELIMINATION Goal: RH STG MANAGE BOWEL WITH ASSISTANCE Description: STG Manage Bowel with Min Assistance. Outcome: Progressing Goal: RH STG MANAGE BOWEL W/MEDICATION W/ASSISTANCE Description: STG Manage Bowel with Medication with [place]. Outcome: Progressing   Problem: RH BLADDER ELIMINATION Goal: RH STG MANAGE BLADDER WITH ASSISTANCE Description: STG Manage Bladder With Min Assistance Outcome: Progressing Goal: RH STG MANAGE BLADDER WITH MEDICATION WITH ASSISTANCE Description: STG Manage Bladder With Medication With [place]. Outcome: Progressing   Problem: RH SKIN INTEGRITY Goal: RH STG MAINTAIN SKIN INTEGRITY WITH ASSISTANCE Description: STG Maintain Skin Integrity With [place]. Outcome: Progressing Goal: RH STG ABLE TO PERFORM INCISION/WOUND CARE W/ASSISTANCE Description: STG Able To Perform Incision/Wound Care With [employer]. Outcome: Progressing   Problem: RH SAFETY Goal: RH STG ADHERE TO SAFETY PRECAUTIONS W/ASSISTANCE/DEVICE Description: STG Adhere to Safety Precautions With cues and reminders. Outcome: Progressing Goal: RH STG DECREASED RISK OF FALL WITH ASSISTANCE Description: STG Decreased Risk of Fall With [employer]. Outcome: Progressing   Problem: RH KNOWLEDGE DEFICIT BRAIN INJURY Goal: RH STG INCREASE KNOWLEDGE OF SELF CARE AFTER BRAIN INJURY Description: Patient will demonstrate knowledge of medication  management, diabetes management, skin/wound care with educational materials and handouts provided by staff independently at discharge. Outcome: Progressing

## 2021-01-27 LAB — GLUCOSE, CAPILLARY
Glucose-Capillary: 108 mg/dL — ABNORMAL HIGH (ref 70–99)
Glucose-Capillary: 71 mg/dL (ref 70–99)
Glucose-Capillary: 95 mg/dL (ref 70–99)

## 2021-01-27 NOTE — Plan of Care (Signed)
  Problem: Consults Goal: RH BRAIN INJURY PATIENT EDUCATION Description: Description: See Patient Education module for eduction specifics Outcome: Progressing Goal: Skin Care Protocol Initiated - if Braden Score 18 or less Description: If consults are not indicated, leave blank or document N/A Outcome: Progressing Goal: Diabetes Guidelines if Diabetic/Glucose > 140 Description: If diabetic or lab glucose is > 140 mg/dl - Initiate Diabetes/Hyperglycemia Guidelines & Document Interventions  Outcome: Progressing   Problem: RH BOWEL ELIMINATION Goal: RH STG MANAGE BOWEL WITH ASSISTANCE Description: STG Manage Bowel with Min Assistance. Outcome: Progressing Goal: RH STG MANAGE BOWEL W/MEDICATION W/ASSISTANCE Description: STG Manage Bowel with Medication with [place]. Outcome: Progressing   Problem: RH BLADDER ELIMINATION Goal: RH STG MANAGE BLADDER WITH ASSISTANCE Description: STG Manage Bladder With Min Assistance Outcome: Progressing Goal: RH STG MANAGE BLADDER WITH MEDICATION WITH ASSISTANCE Description: STG Manage Bladder With Medication With [place]. Outcome: Progressing   Problem: RH SKIN INTEGRITY Goal: RH STG MAINTAIN SKIN INTEGRITY WITH ASSISTANCE Description: STG Maintain Skin Integrity With [place]. Outcome: Progressing Goal: RH STG ABLE TO PERFORM INCISION/WOUND CARE W/ASSISTANCE Description: STG Able To Perform Incision/Wound Care With [employer]. Outcome: Progressing   Problem: RH SAFETY Goal: RH STG ADHERE TO SAFETY PRECAUTIONS W/ASSISTANCE/DEVICE Description: STG Adhere to Safety Precautions With cues and reminders. Outcome: Progressing Goal: RH STG DECREASED RISK OF FALL WITH ASSISTANCE Description: STG Decreased Risk of Fall With [employer]. Outcome: Progressing   Problem: RH KNOWLEDGE DEFICIT BRAIN INJURY Goal: RH STG INCREASE KNOWLEDGE OF SELF CARE AFTER BRAIN INJURY Description: Patient will demonstrate knowledge of medication  management, diabetes management, skin/wound care with educational materials and handouts provided by staff independently at discharge. Outcome: Progressing

## 2021-01-28 LAB — CBC
HCT: 31.2 % — ABNORMAL LOW (ref 36.0–46.0)
Hemoglobin: 9.8 g/dL — ABNORMAL LOW (ref 12.0–15.0)
MCH: 24.1 pg — ABNORMAL LOW (ref 26.0–34.0)
MCHC: 31.4 g/dL (ref 30.0–36.0)
MCV: 76.7 fL — ABNORMAL LOW (ref 80.0–100.0)
Platelets: 318 10*3/uL (ref 150–400)
RBC: 4.07 MIL/uL (ref 3.87–5.11)
RDW: 17.3 % — ABNORMAL HIGH (ref 11.5–15.5)
WBC: 5 10*3/uL (ref 4.0–10.5)
nRBC: 0 % (ref 0.0–0.2)

## 2021-01-28 LAB — BASIC METABOLIC PANEL
Anion gap: 6 (ref 5–15)
BUN: 10 mg/dL (ref 8–23)
CO2: 26 mmol/L (ref 22–32)
Calcium: 8.9 mg/dL (ref 8.9–10.3)
Chloride: 105 mmol/L (ref 98–111)
Creatinine, Ser: 0.83 mg/dL (ref 0.44–1.00)
GFR, Estimated: >60 mL/min (ref >60)
Glucose, Bld: 99 mg/dL (ref 70–99)
Potassium: 3.5 mmol/L (ref 3.5–5.1)
Sodium: 137 mmol/L (ref 135–145)

## 2021-01-28 LAB — GLUCOSE, CAPILLARY
Glucose-Capillary: 213 mg/dL — ABNORMAL HIGH (ref 70–99)
Glucose-Capillary: 83 mg/dL (ref 70–99)
Glucose-Capillary: 127 mg/dL — ABNORMAL HIGH (ref 70–99)
Glucose-Capillary: 131 mg/dL — ABNORMAL HIGH (ref 70–99)

## 2021-01-28 NOTE — Progress Notes (Signed)
Physical Therapy Session Note  Patient Details  Name: Ann Chaney MRN: [id number] Date of Birth: 12-02-1945  Today's Date: 01/28/2021 PT Individual Time: [phone] and 3545-6256 PT Individual Time Calculation (min): 54 min and 54 min  Short Term Goals: Week 1:  PT Short Term Goal 1 (Week 1): Patient will perform basic transfer with CGA using LRAD. PT Short Term Goal 2 (Week 1): Patient will ambulate >100 feet with min A using LRAD. PT Short Term Goal 3 (Week 1): Patient will initiate stair training  Skilled Therapeutic Interventions/Progress Updates:     1st Session: Pt received supine in bed and agrees to therapy. No complaint of pain. Supine to sit with bed features and verbal cues for sequencing. Pt noted to have been incontinent of bowel and brief and bed are soiled. Pt performs sit to stand and ambulatory transfer to toilet in bathroom with CGA and cues for sequencing. Pt attempts pericare and requires maxA to complete effectively, but does perform upper body cleaning with wash cloth with setup assistance. Pt assisted to don clean brief and gown, performing multiple reps of sit to stand with CGA and cues for sequencing. Pt sits at sink to wash hands and brush teeth with setup assistance. Pt then taken to gym via [facility] for time management. Pt ambulates x175' with minA to promote increased gait speed and stride length to improve balance. PT questions pt on orientation and pt indicates that she needs to spit. When given paper towel, pt spits full mouthful of toothpaste, which she had been holding since being in her room. Pt ambulates to bathroom to wash mouth out. WC transport back to room. Left seated in WC with alarm intact and all needs within reach.  2nd Session: Pt received supine in bed and agrees to therapy. No complaint of pain. Supine to sit with cues for sequencing and positioning at EOB. Pt performs stand pivot transfer to [facility] with CGA and cues on hand placement. WC transport outside  for time management. Pt performs outdoor ambulation for gait training over unlevel and varying surfaces. PT ambulates multiple bouts with CGA. Pt's self selected gait speed is very slow and PT attempts to provide verbal and tactile cues to increase gait speed, but pt does not substantially alter gait pattern. Pt ambulates bouts of x150' x2, x300', and x250', including ambulating down 10 steps with L hand rail and up 4 steps with L hand rail and cues for step sequencing. WC transport back to room. Stand pivot to bed with cues on positioning. Left supine with alarm intact and all needs within reach.  Therapy Documentation Precautions:  Precautions Precautions: Fall Precaution Comments: R hemiparesis, aphasia Restrictions Weight Bearing Restrictions: No    Therapy/Group: Individual Therapy  Breck Coons, PT, DPT 01/28/2021, 3:45 PM

## 2021-01-28 NOTE — Progress Notes (Signed)
PROGRESS NOTE   Subjective/Complaints:  No new complaints. Comfortable. Seems to have slept  ROS: Limited due to cognitive/behavioral    Objective:   No results found. Recent Labs    01/28/21 0529  WBC 5.0  HGB 9.8*  HCT 31.2*  PLT 318   Recent Labs    01/28/21 0529  NA 137  K 3.5  CL 105  CO2 26  GLUCOSE 99  BUN 10  CREATININE 0.83  CALCIUM 8.9    Intake/Output Summary (Last 24 hours) at 01/28/2021 1106 Last data filed at 01/28/2021 0850 Gross per 24 hour  Intake 700 ml  Output --  Net 700 ml        Physical Exam: Vital Signs Blood pressure 103/68, pulse 63, temperature 98.6 F (37 C), temperature source Oral, resp. rate 16, height 5\' 1"  (1.549 m), weight 52.4 kg, SpO2 100 %.   Constitutional: No distress . Vital signs reviewed. HEENT: NCAT, EOMI, oral membranes moist Neck: supple Cardiovascular: RRR without murmur. No JVD    Respiratory/Chest: CTA Bilaterally without wheezes or rales. Normal effort    GI/Abdomen: BS +, non-tender, non-distended Ext: no clubbing, cyanosis, or edema Psych: pleasant and cooperative  Neuro:  Pt remains oriented to person and month. Still with processing delays. Normal language. Limited insight and awareness. 4/5 in BUE and BLE Musculoskeletal: fair posture. No pain    Assessment/Plan: 1. Functional deficits which require 3+ hours per day of interdisciplinary therapy in a comprehensive inpatient rehab setting. Physiatrist is providing close team supervision and 24 hour management of active medical problems listed below. Physiatrist and rehab team continue to assess barriers to discharge/monitor patient progress toward functional and medical goals  Care Tool:  Bathing    Body parts bathed by patient: Right arm, Chest, Left arm, Abdomen, Front perineal area, Buttocks, Right upper leg, Left upper leg, Right lower leg, Left lower leg, Face         Bathing assist  Assist Level: Contact Guard/Touching assist     Upper Body Dressing/Undressing Upper body dressing   What is the patient wearing?: Hospital gown only    Upper body assist Assist Level: Supervision/Verbal cueing    Lower Body Dressing/Undressing Lower body dressing      What is the patient wearing?: Hospital gown only     Lower body assist Assist for lower body dressing: Contact Guard/Touching assist     Toileting Toileting    Toileting assist Assist for toileting: Contact Guard/Touching assist     Transfers Chair/bed transfer  Transfers assist     Chair/bed transfer assist level: Contact Guard/Touching assist     Locomotion Ambulation   Ambulation assist      Assist level: Minimal Assistance - Patient > 75% Assistive device: Hand held assist Max distance: 18 ft   Walk 10 feet activity   Assist     Assist level: Minimal Assistance - Patient > 75% Assistive device: Hand held assist   Walk 50 feet activity   Assist Walk 50 feet with 2 turns activity did not occur: Safety/medical concerns         Walk 150 feet activity   Assist Walk 150 feet activity did  not occur: Safety/medical concerns         Walk 10 feet on uneven surface  activity   Assist Walk 10 feet on uneven surfaces activity did not occur: Safety/medical concerns         Wheelchair     Assist Is the patient using a wheelchair?: Yes Type of Wheelchair: Manual    Wheelchair assist level: Total Assistance - Patient < 25% Max wheelchair distance: 6 feet, unable to attend or motor plan with max cues    Wheelchair 50 feet with 2 turns activity    Assist        Assist Level: Total Assistance - Patient < 25%   Wheelchair 150 feet activity     Assist      Assist Level: Total Assistance - Patient < 25%   Blood pressure 103/68, pulse 63, temperature 98.6 F (37 C), temperature source Oral, resp. rate 16, height 5\' 1"  (1.549 m), weight 52.4 kg, SpO2 100  %.  Medical Problem List and Plan: 1.  Impaired mobility following large parasagittal meningioma resection             -patient may shower               -ELOS/Goals: 10-14 days S  -Continue CIR therapies including PT, OT, and SLP  2.  Antithrombotics: -DVT/anticoagulation:  Mechanical: Sequential compression devices, below knee Bilateral lower extremities             -antiplatelet therapy: N/a 3. Pain Management: Continue hydrocodone prn.  4. Mood: LCSW to follow for evaluation and support.              -antipsychotic agents: N/A 5. Neuropsych: This patient is not fully capable of making decisions on her own behalf. 6. Skin/Wound Care: Routine pressure relief measures. Monitor incision for healing.  7. Fluids/Electrolytes/Nutrition: Monitor I/O.              --encourage PO  . I personally reviewed all of the patient's labs today, and lab work is within normal limits.  .  8. HTN: Monitor BP TID- Continue Lisinopril and Toprol.              --11/4 BP/HR stable, HR sl brady 9. T2DM: Hgb A1C-6.2 and well controlled on Metformin, Actos and Glipizide             --Monitor BS ac/hs and use SSI for elevated BS.            CBG (last 3)  Recent Labs    01/27/21 1211 01/27/21 1659 01/28/21 0713  GLUCAP 71 95 213*   -controlled 11/7 up until this last reading--obsv 10. Goiter/Hyperthyroidism: Continue Methimazole.  11. Seizure pophylaxis: Has been seizure free  since surgery. Keppra stopped 12. Constipation: Will add Senna S at bedtime.  13. Hyponatremia: Na stable at 137.  14. Leucocytosis: resolved, d/t steroids   15. Acute blood loss anemia: Hgb 11.8 on 11.2, down from 13.1 12 days prior.   Hgb 9.7 11/3, likely post-op, fluids. No clinical signs of blood loss -9.8 11/7.  16. Open angle glaucoma:  Stable on Xalatan.  17. Insomnia:  continue low dose melatonin.     LOS: 5 days A FACE TO FACE EVALUATION WAS PERFORMED  Meredith Staggers 01/28/2021, 11:06 AM

## 2021-01-28 NOTE — Progress Notes (Signed)
Occupational Therapy Session Note  Patient Details  Name: Ann Chaney MRN: [id number] Date of Birth: [birth year]  Today's Date: 01/28/2021 OT Individual Time: 0932-1030 OT Individual Time Calculation (min): 58 min    Short Term Goals: Week 1:  OT Short Term Goal 1 (Week 1): Patient will complete toilet transfer with CGA OT Short Term Goal 2 (Week 1): Pt will complete all steps of toothbrushing task with only min cues for sequencing OT Short Term Goal 3 (Week 1): Patient will complete LB ADLs with CGA  Skilled Therapeutic Interventions/Progress Updates:    Pt greeted sitting in wc upon OT arrival. Pt stated she had already showered in previous session due to incontinence. Pt agreeable to don clothes and get out of hospital gown. OT handed pt her shirt and she was able to don shirt with set-up A. LB dressing completed from wc with overall supervision, but CGA for balance when standing to pull up pants and no AD. Pt then brought to therapy gym and worked on bimnual beading task with focus on donning small beads with R hand. Progressed to cognitive task using slideboard pattern puzzle. Pt needed moderate cues intially for problem solving, but eventually, able to get moderately complex patterns with only min cues in quiet environment. Functional balance, ambulation, and visual scanning activity with scavenger hunt in dayroom. Focus on head turns during ambulation to locate sticky notes 1-6 across room. Pt brought back to room and ambulated to the bathroom with CGA. Pt with successful continent void and pt able to wash peri-area with set-up A. Pt washed hands at the sink CGA and min cues for initiation. Pt returned to bed and left semi-reclined in bed with bed alarm on, call bell in reach, and needs met.   Therapy Documentation Precautions:  Precautions Precautions: Fall Precaution Comments: R hemiparesis, aphasia Restrictions Weight Bearing Restrictions: No Pain: Denies  pain   Therapy/Group: Individual Therapy  Valma Cava 01/28/2021, 10:03 AM

## 2021-01-28 NOTE — Progress Notes (Signed)
Speech Language Pathology Daily Session Note  Patient Details  Name: Ann Chaney MRN: [id number] Date of Birth: 09-26-1945  Today's Date: 01/28/2021 SLP Individual Time: 1100-1130 SLP Individual Time Calculation (min): 30 min  Short Term Goals: Week 1: SLP Short Term Goal 1 (Week 1): Patient will participate in further cognitive-linguistic and language assessments to further identify functional needs SLP Short Term Goal 2 (Week 1): Patient will respond to biographical and/or environmental yes/no questions during 70% of occasions with mod A verbal/visual cues SLP Short Term Goal 3 (Week 1): Patient will express basic wants/needs/ideas after initial cue with mod A cues SLP Short Term Goal 4 (Week 1): Patient will follow 1-step commands with mod A verbal cues SLP Short Term Goal 5 (Week 1): Patient will attend to basic, familiar tasks for 3 minute intervals with mod A verbal cues for redirection SLP Short Term Goal 6 (Week 1): Patient will demonstrate orientation x4 with mod A cues for use of external aids  Skilled Therapeutic Interventions: Skilled treatment session focused on communication goals. SLP facilitated session by administering the [name]. Patient scored 8/10 in naming, 9/10 in automatic speech, 10/10 in repetition, 10/10 for writing and 0/10 for verbal fluency. For receptive tasks, patient scored 20/20 for yes/no accuracy, 10/10 for object recognition, 8/10 for following instructions and 8/10 for reading instructions. Patient appeared to perform all structured tasks WNL but demonstrated severe deficits in informal and abstract verbal expression that appears to improve mildly with verbal cueing. Suspect patient's overall verbal expression is also impacted by poor attention. Patient left upright in bed with alarm on and all needs within reach. Continue with current plan of care.      Pain No/Denies Pain   Therapy/Group: Individual Therapy  [name],  [name] 01/28/2021, 12:19 PM

## 2021-01-29 LAB — GLUCOSE, CAPILLARY
Glucose-Capillary: 120 mg/dL — ABNORMAL HIGH (ref 70–99)
Glucose-Capillary: 61 mg/dL — ABNORMAL LOW (ref 70–99)
Glucose-Capillary: 70 mg/dL (ref 70–99)
Glucose-Capillary: 107 mg/dL — ABNORMAL HIGH (ref 70–99)
Glucose-Capillary: 145 mg/dL — ABNORMAL HIGH (ref 70–99)
Glucose-Capillary: 109 mg/dL — ABNORMAL HIGH (ref 70–99)

## 2021-01-29 MED ORDER — GLIPIZIDE 5 MG PO TABS
2.5000 mg | ORAL_TABLET | Freq: Every day | ORAL | Status: DC
  Administered 2021-01-30 – 2021-02-02 (×4): 2.5 mg via ORAL
  Filled 2021-01-29 (×4): qty 1

## 2021-01-29 NOTE — Progress Notes (Signed)
Physical Therapy Session Note  Patient Details  Name: Ann Chaney MRN: [id number] Date of Birth: 06-29-1945  Today's Date: 01/29/2021 PT Individual Time: 3094-0768 PT Individual Time Calculation (min): 55 min   Short Term Goals: Week 1:  PT Short Term Goal 1 (Week 1): Patient will perform basic transfer with CGA using LRAD. PT Short Term Goal 2 (Week 1): Patient will ambulate >100 feet with min A using LRAD. PT Short Term Goal 3 (Week 1): Patient will initiate stair training  Skilled Therapeutic Interventions/Progress Updates:     Pt received supine in bed and agrees to therapy. No complaint of pain. Supine to sit with bed features. Pt performs stand pivot transfer to [facility] with verbal cues on hand placement and sequencing. WC transport to gym for time management. Pt performs stand pivot to mat table with close supervision and cues for initiation. Pt performs NMR for standing balance, performing toe taps on 4 inch step with mirror provided for visual feedback. Pt does not use upper extremity support and requires minA due to posterior bias and increased postural sway. PT provides cues for upright gaze to improve posture and balance, and facilitate lateral weight shifts. 3x10 with seated rest breaks. Pt progresses to performing full step-ups 2x10 with minA and cues for body mechanics and anterior weight shifting. Progression to performing lateral steps ups from R<>L. PT consistently has to cue pt to take larger steps to give opposite foot enough room to maneuver. Pt demonstrates fair carryover of feedback, and is minimally verbal during session.  Pt appears frustrated by activity and does mention fatigue. Pt then performs Nustep for strength and endurance training, x12:00 at workload of 4 with average steps per minute ~30. Pt transfers from Nustep to [facility] with cues for hand placement. WC transport back to room. Left seated in WC with alarm intact and all needs within reach.  Therapy  Documentation Precautions:  Precautions Precautions: Fall Precaution Comments: R hemiparesis, aphasia Restrictions Weight Bearing Restrictions: No  Therapy/Group: Individual Therapy  Breck Coons, PT, DPT 01/29/2021, 2:10 PM

## 2021-01-29 NOTE — Progress Notes (Signed)
Speech Language Pathology Daily Session Note  Patient Details  Name: Ann Chaney MRN: [id number] Date of Birth: 11-10-1945  Today's Date: 01/29/2021 SLP Individual Time: 1400-1445 SLP Individual Time Calculation (min): 45 min  Short Term Goals: Week 1: SLP Short Term Goal 1 (Week 1): Patient will participate in further cognitive-linguistic and language assessments to further identify functional needs SLP Short Term Goal 2 (Week 1): Patient will respond to biographical and/or environmental yes/no questions during 70% of occasions with mod A verbal/visual cues SLP Short Term Goal 3 (Week 1): Patient will express basic wants/needs/ideas after initial cue with mod A cues SLP Short Term Goal 4 (Week 1): Patient will follow 1-step commands with mod A verbal cues SLP Short Term Goal 5 (Week 1): Patient will attend to basic, familiar tasks for 3 minute intervals with mod A verbal cues for redirection SLP Short Term Goal 6 (Week 1): Patient will demonstrate orientation x4 with mod A cues for use of external aids  Skilled Therapeutic Interventions: Skilled treatment session focused on cognitive-linguistic goals. SLP facilitated session by utilizing the memory compensatory strategy of association to maximize recall of novel information. Patient was able to successfully use this strategy with 25% accuracy. Throughout this task, patient also had to complete generative naming tasks in which she completed with 85% accuracy and Mod verbal cues. Patient requested to use the bathroom and ambulated to the commode with HHA. Patient was continent of urine and completed peri care. Patient returned to the bed at the end of session. Patient's daughter present and educated regarding patient's current cognitive-linguistic function and goals of skilled SLP intervention. She verbalized understanding. Patient left upright in bed with alarm on and all needs within reach. Continue with current plan of care.       Pain No/Denies Pain   Therapy/Group: Individual Therapy  Kemba Hoppes, Elma 01/29/2021, 2:49 PM

## 2021-01-29 NOTE — Progress Notes (Signed)
Occupational Therapy Session Note  Patient Details  Name: Ann Chaney MRN: [id number] Date of Birth: 1945/05/21  Today's Date: 01/29/2021 OT Individual Time: 1300-1400 OT Individual Time Calculation (min): 60 min   Short Term Goals: Week 2:  OT Short Term Goal 1 (Week 2): LTG =STG 2/2 ELOS  Skilled Therapeutic Interventions/Progress Updates:    Pt greeted seated in wc finihsing lunch with daughter present. Pt able to open containers and utilize feeding utensils appropriately with R hand. Pt declined need to use the bathroom and brought to the gym in wc. Worked on fine motor coordination with coloring activity. Had pt name colors and tried engaging patient in conversation while maintaining attention to task. Pt unable to continue coloring while OT asked questions. OT then provided pt with 5 ingredient recipe and had pt visually scan to locate 5 ingredients. Pt needed min cues to see when she missed an ingreient and moderate cues to verbalize the ingredients. Pt did this with 2 recipes. OT then had pt recall her own recipe for chicken salad which she did without issues. Functional ambulation in hallway with CGA and focus on visually scanning L and R to locate room numbers with mod cues. OT then had pt try to recall the recipes we collected items for earlier in session. Pt able to recall first recipe, but unable to recall second with max cues. Pt handoff to SLP for next therapy session.   Therapy Documentation Precautions:  Precautions Precautions: Fall Precaution Comments: R hemiparesis, aphasia Restrictions Weight Bearing Restrictions: No Pain:  Denies pain   Therapy/Group: Individual Therapy  Valma Cava 01/29/2021, 2:05 PM

## 2021-01-29 NOTE — Progress Notes (Signed)
Patient with low BS in am--will decrease glucotrol to 2.5 mg daily starting tomorrow.

## 2021-01-29 NOTE — Patient Care Conference (Signed)
Inpatient RehabilitationTeam Conference and Plan of Care Update Date: 01/29/2021   Time: 10:35 AM    Patient Name: Ann Chaney      Medical Record Number: [id number]  Date of Birth: 03/23/1946 Sex: Female         Room/Bed: 4M11C/4M11C-01 Payor Info: Payor: [employer] MEDICARE / Plan: [facility] MEDICARE / Product Type: *No Product type* /    Admit Date/Time:  01/23/2021  1:34 PM  Primary Diagnosis:  S/P resection of meningioma  Hospital Problems: Principal Problem:   S/P resection of meningioma    Expected Discharge Date: Expected Discharge Date: 02/07/21  Team Members Present: Physician leading conference: Dr. Alger Simons Social Worker Present: [name], [place] Nurse Present: Dorthula Nettles, RN PT Present: Tereasa Coop, PT OT Present: Cherylynn Ridges, OT SLP Present: Weston Anna, SLP PPS Coordinator present : Gunnar Fusi, SLP     Current Status/Progress Goal Weekly Team Focus  Bowel/Bladder   Patient continent with episodes on incontinence B and B . LBM 11/7  Remain continent  Offer timed toileting as needed   Swallow/Nutrition/ Hydration             ADL's             Mobility   supervision bed mobility, CGA bed to chair transfer, minA ambulation ~300' without AD  Supervision/CGA  attention, cognition, balance, ambulation   Communication   Mod A  Min A  word-finding and verbal expression at the phrase level, spontaneous verbalizations   Safety/Cognition/ Behavioral Observations  Mod-Max A  Min A  attention, orientation   Pain   Patient denies pain  <3 out of 10  Assess q shift and treat PRN   Skin   Head incision approximated and OTA  no s/s of infection  Assess q shift     Discharge Planning:  D/c to home and her dtr will be moving in with her. No concerns about supports.   Team Discussion: Medically doing well. Has awareness and memory deficits. Incontinent B/B. Head incision OTA. Daughter moving in to provide 24/7. Poor attention. Contact  guard assist with min cues. Contact guard/min assist with ambulation. Doesn't incorporate feedback well. Does well with structured task. Max assist due to cognition. Min assist goals. Patient on target to meet rehab goals: yes  *See Care Plan and progress notes for long and short-term goals.   Revisions to Treatment Plan:  None at this time.  Teaching Needs: Family education, medication management, bowel/bladder management, safety awareness, skin/wound care, transfers, etc.  Current Barriers to Discharge: Decreased caregiver support, Home enviroment access/layout, Incontinence, Wound care, Lack of/limited family support, and Medication compliance  Possible Resolutions to Barriers: Family education Timed toileting schedule Follow-up HH/Outpatient therapy      Medical Summary Current Status: meningioma s/p resection. pain improved, moving well. still with significant cognitive deficits. bp/cbg's controlled. eating well  Barriers to Discharge: Medical stability   Possible Resolutions to [employer] Focus: daily assessment of labs and patient data   Continued Need for Acute Rehabilitation Level of Care: The patient requires daily medical management by a physician with specialized training in physical medicine and rehabilitation for the following reasons: Direction of a multidisciplinary physical rehabilitation program to maximize functional independence : Yes Medical management of patient stability for increased activity during participation in an intensive rehabilitation regime.: Yes Analysis of laboratory values and/or radiology reports with any subsequent need for medication adjustment and/or medical intervention. : Yes   I attest that I was present, lead the  team conference, and concur with the assessment and plan of the team.   Cristi Loron 01/29/2021, 3:14 PM

## 2021-01-29 NOTE — Progress Notes (Signed)
Patient ID: Ann Chaney, female   DOB: 07-Jul-1945, 75 y.o.   MRN: [id number]  SW met with pt in room to provide d/c date from team conference. Pt aware SW to follow-up with her dtr.   1314- SW left message for pt dtr Ann Chaney [phone]) to discuss updates from team conference.   [name], MSW, [place] Office: [phone] Cell: [phone] Fax: [phone]

## 2021-01-29 NOTE — Progress Notes (Signed)
PROGRESS NOTE   Subjective/Complaints:  Pt without complaints today. Seems to be in good spirits. Denied pain  ROS: Limited due to cognitive/behavioral    Objective:   No results found. Recent Labs    01/28/21 0529  WBC 5.0  HGB 9.8*  HCT 31.2*  PLT 318   Recent Labs    01/28/21 0529  NA 137  K 3.5  CL 105  CO2 26  GLUCOSE 99  BUN 10  CREATININE 0.83  CALCIUM 8.9    Intake/Output Summary (Last 24 hours) at 01/29/2021 0919 Last data filed at 01/28/2021 1320 Gross per 24 hour  Intake 177 ml  Output --  Net 177 ml        Physical Exam: Vital Signs Blood pressure (!) 103/47, pulse 66, temperature 98.3 F (36.8 C), temperature source Oral, resp. rate 14, height 5\' 1"  (1.549 m), weight 52.4 kg, SpO2 100 %.   Constitutional: No distress . Vital signs reviewed. HEENT: NCAT, EOMI, oral membranes moist Neck: supple Cardiovascular: RRR without murmur. No JVD    Respiratory/Chest: CTA Bilaterally without wheezes or rales. Normal effort    GI/Abdomen: BS +, non-tender, non-distended Ext: no clubbing, cyanosis, or edema Skin: crani incision cdi Psych: pleasant and cooperative, delayed Neuro:  Pt remains oriented to person and month. Still with processing delays. Normal language and speech. Limited insight and awareness. 4/5 in BUE and BLE Musculoskeletal: fair posture. No pain    Assessment/Plan: 1. Functional deficits which require 3+ hours per day of interdisciplinary therapy in a comprehensive inpatient rehab setting. Physiatrist is providing close team supervision and 24 hour management of active medical problems listed below. Physiatrist and rehab team continue to assess barriers to discharge/monitor patient progress toward functional and medical goals  Care Tool:  Bathing    Body parts bathed by patient: Right arm, Chest, Left arm, Abdomen, Front perineal area, Buttocks, Right upper leg, Left upper  leg, Right lower leg, Left lower leg, Face         Bathing assist Assist Level: Contact Guard/Touching assist     Upper Body Dressing/Undressing Upper body dressing   What is the patient wearing?: Hospital gown only    Upper body assist Assist Level: Supervision/Verbal cueing    Lower Body Dressing/Undressing Lower body dressing      What is the patient wearing?: Hospital gown only     Lower body assist Assist for lower body dressing: Contact Guard/Touching assist     Toileting Toileting    Toileting assist Assist for toileting: Contact Guard/Touching assist     Transfers Chair/bed transfer  Transfers assist     Chair/bed transfer assist level: Contact Guard/Touching assist     Locomotion Ambulation   Ambulation assist      Assist level: Minimal Assistance - Patient > 75% Assistive device: Hand held assist Max distance: 18 ft   Walk 10 feet activity   Assist     Assist level: Minimal Assistance - Patient > 75% Assistive device: Hand held assist   Walk 50 feet activity   Assist Walk 50 feet with 2 turns activity did not occur: Safety/medical concerns         Walk  150 feet activity   Assist Walk 150 feet activity did not occur: Safety/medical concerns         Walk 10 feet on uneven surface  activity   Assist Walk 10 feet on uneven surfaces activity did not occur: Safety/medical concerns         Wheelchair     Assist Is the patient using a wheelchair?: Yes Type of Wheelchair: Manual    Wheelchair assist level: Total Assistance - Patient < 25% Max wheelchair distance: 6 feet, unable to attend or motor plan with max cues    Wheelchair 50 feet with 2 turns activity    Assist        Assist Level: Total Assistance - Patient < 25%   Wheelchair 150 feet activity     Assist      Assist Level: Total Assistance - Patient < 25%   Blood pressure (!) 103/47, pulse 66, temperature 98.3 F (36.8 C), temperature  source Oral, resp. rate 14, height 5\' 1"  (1.549 m), weight 52.4 kg, SpO2 100 %.  Medical Problem List and Plan: 1.  Impaired mobility following large parasagittal meningioma resection             -patient may shower               -ELOS/Goals: 10-14 days S  -Continue CIR therapies including PT, OT, and SLP. Interdisciplinary team conference today to discuss goals, barriers to discharge, and dc planning.   2.  Antithrombotics: -DVT/anticoagulation:  Mechanical: Sequential compression devices, below knee Bilateral lower extremities             -antiplatelet therapy: N/a 3. Pain Management: Continue hydrocodone prn.  4. Mood: LCSW to follow for evaluation and support.              -antipsychotic agents: N/A 5. Neuropsych: This patient is not fully capable of making decisions on her own behalf. 6. Skin/Wound Care: crani incision healing nicely 7. Fluids/Electrolytes/Nutrition: Monitor I/O.              --eating well    8. HTN: Monitor BP TID- Continue Lisinopril and Toprol.              --11/4 BP/HR stable, HR sl brady 9. T2DM: Hgb A1C-6.2 and well controlled on Metformin, Actos and Glipizide             --Monitor BS ac/hs and use SSI for elevated BS.            CBG (last 3)  Recent Labs    01/28/21 1645 01/28/21 2133 01/29/21 0648  GLUCAP 127* 131* 120*   -controlled 11/8   10. Goiter/Hyperthyroidism: Continue Methimazole.  11. Seizure pophylaxis: Has been seizure free  since surgery. Keppra stopped 12. Constipation: Will add Senna S at bedtime.  13. Hyponatremia: Na stable at 137.  14. Leucocytosis: resolved, d/t steroids   15. Acute blood loss anemia: Hgb 11.8 on 11.2, down from 13.1 12 days prior.   Hgb 9.7 11/3, likely post-op, fluids. No clinical signs of blood loss -9.8 11/7.  16. Open angle glaucoma:  Stable on Xalatan.  17. Insomnia:  continue low dose melatonin.     LOS: 6 days A FACE TO FACE EVALUATION WAS PERFORMED  Meredith Staggers 01/29/2021, 9:19 AM

## 2021-01-29 NOTE — Progress Notes (Signed)
Patient's blood sugar midday was 61mg /dl. Gave patient a bottle of ensure to drink with lunch enroute in 15 minutes.  No symptoms of hypoglycemia noted. Patient remained alert and oriented and answering questions with no difficulty. Half hour after lunch, blood sugar rechecked and it increased to 70mg /dl.  PA notified for further intervention. When rechecked an hour later, blood sugar was 107mg /dl.  Will continue to monitor.

## 2021-01-29 NOTE — Progress Notes (Signed)
Occupational Therapy Weekly Progress Note  Patient Details  Name: MARELI ANTUNES MRN: [id number] Date of Birth: 1945/12/23 3 Beginning of progress report period: January 23, 2021 End of progress report period: January 29, 2021  Today's Date: 01/29/2021 OT Individual Time: 7622-6333 OT Individual Time Calculation (min): 45 min    Patient has met 3 of 3 short term goals.  Patient is making steady progress towards OT goals. PT is at an overall CGA for functional BADL tasks requiring min cues for safety, awareness,and sequencing within functional tasks. Continue current POC.  Patient continues to demonstrate the following deficits: muscle weakness, decreased initiation, decreased attention, decreased awareness, and decreased problem solving, and decreased standing balance, decreased postural control, and decreased balance strategies and therefore will continue to benefit from skilled OT intervention to enhance overall performance with BADL and Reduce care partner burden.  Patient progressing toward long term goals..  Continue plan of care.  OT Short Term Goals Week 1:  OT Short Term Goal 1 (Week 1): Patient will complete toilet transfer with CGA OT Short Term Goal 1 - Progress (Week 1): Met OT Short Term Goal 2 (Week 1): Pt will complete all steps of toothbrushing task with only min cues for sequencing OT Short Term Goal 2 - Progress (Week 1): Met OT Short Term Goal 3 (Week 1): Patient will complete LB ADLs with CGA OT Short Term Goal 3 - Progress (Week 1): Met Week 2:  OT Short Term Goal 1 (Week 2): LTG =STG 2/2 ELOS  Skilled Therapeutic Interventions/Progress Updates:    Pt greeted semi-reclined in bed and agreeable to OT treatment session. Pt had already eaten breakfast. Pt incontinent of urine and agreeable to get to the bathroom. Pt ambulated with CGA and verbal cues not to reach for cabinets and walls. Min Cues to locate commode and sit safely. Pt needed min A for clothing management  and voided more in commode. Set-up A to collect toilet paper with CGA for balance for standing peri-care. Pt then ambulated to shower seat for bathing. Bathing completed at shower level with CGA and min cues to move on to next body part as pt perseverating on washing face. Dressing tasks completed at EOB with focus on standing balance/endurance, sequencing, and anterior weight shift with sit<>stand. Pt wanted to return to bed at end of session and left semi-reclined in bed with alarm on, call bell in reach, and needs met.   Therapy Documentation Precautions:  Precautions Precautions: Fall Precaution Comments: R hemiparesis, aphasia Restrictions Weight Bearing Restrictions: No Pain:  Denies pain   Therapy/Group: Individual Therapy  Valma Cava 01/29/2021, 8:29 AM

## 2021-01-30 LAB — GLUCOSE, CAPILLARY
Glucose-Capillary: 95 mg/dL (ref 70–99)
Glucose-Capillary: 57 mg/dL — ABNORMAL LOW (ref 70–99)
Glucose-Capillary: 53 mg/dL — ABNORMAL LOW (ref 70–99)
Glucose-Capillary: 56 mg/dL — ABNORMAL LOW (ref 70–99)
Glucose-Capillary: 66 mg/dL — ABNORMAL LOW (ref 70–99)
Glucose-Capillary: 95 mg/dL (ref 70–99)
Glucose-Capillary: 118 mg/dL — ABNORMAL HIGH (ref 70–99)
Glucose-Capillary: 238 mg/dL — ABNORMAL HIGH (ref 70–99)

## 2021-01-30 MED ORDER — METFORMIN HCL 500 MG PO TABS
500.0000 mg | ORAL_TABLET | Freq: Two times a day (BID) | ORAL | Status: DC
  Administered 2021-01-30 – 2021-02-07 (×16): 500 mg via ORAL
  Filled 2021-01-30 (×16): qty 1

## 2021-01-30 NOTE — Clinical Note (Incomplete)
Patient anticipates discharge home

## 2021-01-30 NOTE — Plan of Care (Signed)
  Problem: Consults Goal: RH BRAIN INJURY PATIENT EDUCATION Description: Description: See Patient Education module for eduction specifics Outcome: Progressing Goal: Skin Care Protocol Initiated - if Braden Score 18 or less Description: If consults are not indicated, leave blank or document N/A Outcome: Progressing Goal: Diabetes Guidelines if Diabetic/Glucose > 140 Description: If diabetic or lab glucose is > 140 mg/dl - Initiate Diabetes/Hyperglycemia Guidelines & Document Interventions  Outcome: Progressing   Problem: RH BOWEL ELIMINATION Goal: RH STG MANAGE BOWEL WITH ASSISTANCE Description: STG Manage Bowel with Min Assistance. Outcome: Progressing Goal: RH STG MANAGE BOWEL W/MEDICATION W/ASSISTANCE Description: STG Manage Bowel with Medication with [place]. Outcome: Progressing   Problem: RH BLADDER ELIMINATION Goal: RH STG MANAGE BLADDER WITH ASSISTANCE Description: STG Manage Bladder With Min Assistance Outcome: Progressing Goal: RH STG MANAGE BLADDER WITH MEDICATION WITH ASSISTANCE Description: STG Manage Bladder With Medication With [employer]. Outcome: Progressing   Problem: RH SKIN INTEGRITY Goal: RH STG MAINTAIN SKIN INTEGRITY WITH ASSISTANCE Description: STG Maintain Skin Integrity With [place]. Outcome: Progressing Goal: RH STG ABLE TO PERFORM INCISION/WOUND CARE W/ASSISTANCE Description: STG Able To Perform Incision/Wound Care With [employer]. Outcome: Progressing   Problem: RH SAFETY Goal: RH STG ADHERE TO SAFETY PRECAUTIONS W/ASSISTANCE/DEVICE Description: STG Adhere to Safety Precautions With cues and reminders. Outcome: Progressing Goal: RH STG DECREASED RISK OF FALL WITH ASSISTANCE Description: STG Decreased Risk of Fall With [employer]. Outcome: Progressing   Problem: RH KNOWLEDGE DEFICIT BRAIN INJURY Goal: RH STG INCREASE KNOWLEDGE OF SELF CARE AFTER BRAIN INJURY Description: Patient will demonstrate knowledge of medication  management, diabetes management, skin/wound care with educational materials and handouts provided by staff independently at discharge. Outcome: Progressing

## 2021-01-30 NOTE — Progress Notes (Signed)
Occupational Therapy Session Note  Patient Details  Name: Ann Chaney MRN: [id number] Date of Birth: [birth year]  Today's Date: 01/30/2021 OT Individual Time: 3202-3343 OT Individual Time Calculation (min): 44 min    Short Term Goals: Week 2:  OT Short Term Goal 1 (Week 2): LTG =STG 2/2 ELOS  Skilled Therapeutic Interventions/Progress Updates:    Pt in bed to start with family present.  She was able to complete supine to sit with supervision and then ambulated down to the dayroom with min guard assist and no device.  Once in the dayroom she engaged in balance and cognitive task of playing [employer].  She was able to stand and reach for the bean bags at various levels with min guard and then toss them to the target with supervision while standing on a solid surface.  She needed mod questioning cueing to recall the point values and to keep up with her total number of points.  Min guard assist for balance noted when retrieving the bean bags after tossing them.  She was not oriented to place, stating "[facility]", and could not report why she was here in the hospital.  Had her also work on dynamic standing balance with use of a foam surface.  Mod assist was needed secondary to posterior LOB frequently.  Returned to the room at the end of the session with the pt resting in the bed and her family member present.  Call button and phone in reach.    Therapy Documentation Precautions:  Precautions Precautions: Fall Precaution Comments: R hemiparesis, aphasia Restrictions Weight Bearing Restrictions: No  Pain: Pain Assessment Pain Scale: Faces Pain Score: 0-No pain ADL: See Care Tool Section for some details of mobility and selfcare  Therapy/Group: Individual Therapy  Areil Ottey OTR/L 01/30/2021, 3:59 PM

## 2021-01-30 NOTE — Progress Notes (Signed)
Speech Language Pathology Daily Session Note  Patient Details  Name: Ann Chaney MRN: [id number] Date of Birth: 1945/12/29  Today's Date: 01/30/2021 SLP Individual Time: 1510-1540 SLP Individual Time Calculation (min): 30 min  Short Term Goals: Week 1: SLP Short Term Goal 1 (Week 1): Patient will participate in further cognitive-linguistic and language assessments to further identify functional needs SLP Short Term Goal 2 (Week 1): Patient will respond to biographical and/or environmental yes/no questions during 70% of occasions with mod A verbal/visual cues SLP Short Term Goal 3 (Week 1): Patient will express basic wants/needs/ideas after initial cue with mod A cues SLP Short Term Goal 4 (Week 1): Patient will follow 1-step commands with mod A verbal cues SLP Short Term Goal 5 (Week 1): Patient will attend to basic, familiar tasks for 3 minute intervals with mod A verbal cues for redirection SLP Short Term Goal 6 (Week 1): Patient will demonstrate orientation x4 with mod A cues for use of external aids  Skilled Therapeutic Interventions:   Patient seen with daughter present in room for skilled ST focusing on cognitive goals. Patient was awake and alert but stating she was tired. She did require consistent verbal cues to initiate responses. She was able to name 5 items in a given category with min-modA semantic cues from SLP and her daughter. When answering questions about her family, such as trip to [place], and her dog, she required modA cues to elaborate from single word responses into short descriptions. Daughter asked SLP if activities such as card games would be good for her and SLP in agreement, informing daughter that any activity that requires her to attend, think and initiate responses will be helpful. Patient left in bed with daughter in room. She continues to benefit from skilled SLP intervention to maximize cognitive function prior to discharge.  Pain Pain Assessment Pain Scale:  0-10 Pain Score: 0-No pain  Therapy/Group: Individual Therapy  Sonia Baller, MA, CCC-SLP Speech Therapy

## 2021-01-30 NOTE — Progress Notes (Signed)
Patient resting most of shift , denies pain, monitor and assisted

## 2021-01-30 NOTE — Progress Notes (Signed)
Patient ID: Ann Chaney, female   DOB: [birth year], 75 y.o.   MRN: [id number]  SW returned phone call to pt dtr [name] to provide updates from team conference, and d/c date 11/17. SW discussed family edu. Fam edu scheduled for Friday (11/11) 9am-12pm.  [name], MSW, [place] Office: [phone] Cell: [phone] Fax: [phone]

## 2021-01-30 NOTE — Progress Notes (Signed)
Patient with BS drop again to 50--glucotrol decreased today, will decrease metformin also to 500 mg bid. Did not rise with soda--advised nurse to offer ensure or protein supplement with recheck BS.

## 2021-01-30 NOTE — Progress Notes (Signed)
Physical Therapy Session Note  Patient Details  Name: SHAWNIE NICOLE MRN: [id number] Date of Birth: 03/04/1946  Today's Date: 01/30/2021 PT Individual Time: 0900-0925 PT Individual Time Calculation (min): 25 min   Short Term Goals: Week 1:  PT Short Term Goal 1 (Week 1): Patient will perform basic transfer with CGA using LRAD. PT Short Term Goal 2 (Week 1): Patient will ambulate >100 feet with min A using LRAD. PT Short Term Goal 3 (Week 1): Patient will initiate stair training  Skilled Therapeutic Interventions/Progress Updates:    Patient received supine in bed, agreeable to PT with extensive encouragement and education on importance of OOB mobility. She denies pain. Patient unable to recall tasks completed from previous session (completed ~15 mins before the start of this one). She was able to come sit edge of bed with supervision and HOB slightly elevated. Ambulated 60ft x3 with RW and light CGA- downward gaze and some path deviation noted, but receptive to multimodal cues for improved posture and gait mechanics. Patient ambulated final 66ft with no AD and CGA with faster gait speed noted and fewer gait deviations. Patient requesting to return to bed due to fatigue. Bed alarm on, call light within reach.   Therapy Documentation Precautions:  Precautions Precautions: Fall Precaution Comments: R hemiparesis, aphasia Restrictions Weight Bearing Restrictions: No     Therapy/Group: Individual Therapy  Karoline Caldwell, PT, DPT, CBIS  01/30/2021, 7:37 AM

## 2021-01-30 NOTE — Progress Notes (Signed)
Physical Therapy Session Note  Patient Details  Name: Ann Chaney MRN: [id number] Date of Birth: [birth year]  Today's Date: 01/30/2021 PT Individual Time: 1550-1650 PT Individual Time Calculation (min): 60 min   Short Term Goals: Week 1:  PT Short Term Goal 1 (Week 1): Patient will perform basic transfer with CGA using LRAD. PT Short Term Goal 2 (Week 1): Patient will ambulate >100 feet with min A using LRAD. PT Short Term Goal 3 (Week 1): Patient will initiate stair training Week 2:     Skilled Therapeutic Interventions/Progress Updates:   Pt received supine in bed and agreeable to PT. Supine>sit transfer with supervision assist and cues for use of LUE to push into sitting. Pt transferred to [facility] with supervision assist and no AD. Transported to rehab gym in [facility]. Gait training wth no AD x 175f with CGA for safety with only 1 instance of unsteadiness, but able to correct without additional assist.   Pt performed dynamic standing balance to complete low and moderate difficulty peg board puzzles while standing in airex pad. Supervision assist- min assist with cues for anterior weight shift with ankle strategy to correct posterior bias; mod cues for error correction wih use of visual aid from picture.   Pt noted to have incontinent episode. Pt transported to bathroom and performed clothing management and hygiene with set up assist from PT for safety. Stand pivot transfer to and from toilet with CGA for safety. Hand hygiene with set up assist following transfer to and from toilet.   BITS sequencing a-z standing on level surface. Pt able to sequence a-e then required max cues to name letter. Able to locate once instructed. Pt returned to room and performed stand pivot transfer to bed with no AD and supervision assist. Sit>supine completed with supervision assist for improved positioning of the LLE and left supine in bed with call bell in reach and all needs met.        Therapy  Documentation Precautions:  Precautions Precautions: Fall Precaution Comments: R hemiparesis, aphasia Restrictions Weight Bearing Restrictions: No    Vital Signs: Therapy Vitals Temp: 98.6 F (37 C) Temp Source: Oral Pulse Rate: 66 Resp: 15 BP: 135/72 Patient Position (if appropriate): Sitting Oxygen Therapy SpO2: 100 % O2 Device: Room Air Pain: Pain Assessment Pain Scale: Faces Pain Score: 0-No pain denies    Therapy/Group: Individual Therapy  ALorie Phenix11/11/2020, 4:58 PM

## 2021-01-30 NOTE — Progress Notes (Signed)
Hypoglycemic Event  CBG: 57  Treatment: 4 oz juice/soda  Symptoms: None  Follow-up CBG: Time:1143 CBG Result:56  Possible Reasons for Event: Unknown  Comments/MD notified:PA notified and ordered to give Ensure patient received and drank ensure and eat lunch patient blood sugar rechecked and presents at 95. Patient throughout showed no s/s of hypoglycemic event     Cloyd Ragas L [place]

## 2021-01-30 NOTE — Progress Notes (Signed)
Physical Therapy Session Note  Patient Details  Name: Ann Chaney MRN: [id number] Date of Birth: [birth year]  Today's Date: 01/30/2021 PT Individual Time: 4159-7331 PT Individual Time Calculation (min): 43 min   Short Term Goals: Week 1:  PT Short Term Goal 1 (Week 1): Patient will perform basic transfer with CGA using LRAD. PT Short Term Goal 2 (Week 1): Patient will ambulate >100 feet with min A using LRAD. PT Short Term Goal 3 (Week 1): Patient will initiate stair training  Skilled Therapeutic Interventions/Progress Updates:     Pt supine in bed upon entering room, pt agreeable to PT tx. RN arriving shortly after for morning rx. Retrieved disposable pants from utility closet. Bed mobility completed at supervision level with HOB elevated. Donned disposable pants with minA for threading. Unfortunately, upon standing, pt with episode of large bowel incontinence. Ambulated to toilet with CGA and no AD and pt was then continent of further bowel and bladder. She was able to wipe for posterior pericare but required assist for thoroughness. Needed to retrieve 2nd set of disposable pants and these were donned in similar manner as above. Pt ambulates sinkside with CGA for hand hygiene and then ambulates to ortho rehab gym with CGA and no AD, ~173f. X1 LOB to the R with staff entering/exiting doorways of other pt's rooms, causing pt to lose balance. Gait deficits include short step lengths bilaterally with increased lateral sway - noted to reach for hand rails or other nearby objects for balance. In rehab gym, worked on problem solving, task sequencing, and higher level thought processing with BITS system - completed these tasks in standing with CGA for balance - no seated rest break throughout. Noted with distractions from other patients or noises, performance is impacted greatly. Pt then ambulated back to her room with minA and no AD with increased assist needed 2/2 fatigue with increased unsteadiness.  NT in room changing bed linen and agreed to assist pt back to bed when complete. Pt remained seated in chair at completion of session with all needs met, direct handoff of care to NT.   Therapy Documentation Precautions:  Precautions Precautions: Fall Precaution Comments: R hemiparesis, aphasia Restrictions Weight Bearing Restrictions: No General:    Therapy/Group: Individual Therapy  CAlger Simons11/11/2020, 7:42 AM

## 2021-01-31 LAB — GLUCOSE, CAPILLARY
Glucose-Capillary: 139 mg/dL — ABNORMAL HIGH (ref 70–99)
Glucose-Capillary: 102 mg/dL — ABNORMAL HIGH (ref 70–99)

## 2021-01-31 MED ORDER — METHYLPHENIDATE HCL 5 MG PO TABS
5.0000 mg | ORAL_TABLET | Freq: Two times a day (BID) | ORAL | Status: DC
  Administered 2021-02-01 – 2021-02-07 (×13): 5 mg via ORAL
  Filled 2021-01-31 (×14): qty 1

## 2021-01-31 NOTE — Progress Notes (Signed)
Speech Language Pathology Weekly Progress and Session Note  Patient Details  Name: Ann Chaney MRN: [id number] Date of Birth: 17-Dec-1945  Beginning of progress report period: January 24, 2021 End of progress report period: January 31, 2021  Today's Date: 01/31/2021 SLP Individual Time: 0900-0930 SLP Individual Time Calculation (min): 30 min  Short Term Goals: Week 1: SLP Short Term Goal 1 (Week 1): Patient will participate in further cognitive-linguistic and language assessments to further identify functional needs SLP Short Term Goal 1 - Progress (Week 1): Met SLP Short Term Goal 2 (Week 1): Patient will respond to biographical and/or environmental yes/no questions during 70% of occasions with mod A verbal/visual cues SLP Short Term Goal 2 - Progress (Week 1): Met SLP Short Term Goal 3 (Week 1): Patient will express basic wants/needs/ideas after initial cue with mod A cues SLP Short Term Goal 3 - Progress (Week 1): Met SLP Short Term Goal 4 (Week 1): Patient will follow 1-step commands with mod A verbal cues SLP Short Term Goal 4 - Progress (Week 1): Met SLP Short Term Goal 5 (Week 1): Patient will attend to basic, familiar tasks for 3 minute intervals with mod A verbal cues for redirection SLP Short Term Goal 5 - Progress (Week 1): Not met SLP Short Term Goal 6 (Week 1): Patient will demonstrate orientation x4 with mod A cues for use of external aids SLP Short Term Goal 6 - Progress (Week 1): Not met    New Short Term Goals: Week 2: SLP Short Term Goal 1 (Week 2): STGs=LTGs due to ELOS  Weekly Progress Updates: Patient mas made functional but inconsistent gains and has met 4 of 6 STGs this reporting period. Patient demonstrates increased auditory comprehension of basic information with increased ability to follow single and multi-step commands. Patient demonstrates improved verbal expression during structured tasks but continues to require overall Max A multimodal cues for  spontaneous verbalizations due to impaired initiation and word-finding. Patient continues to demonstrate impaired cognitive functioning and requires Mod-Max A multimodal cues to complete functional and familiar tasks safely in regards to orientation, problem solving and attention. Patient and family education ongoing. Patient would benefit from continued skilled SLP intervention to maximize her cognitive-linguistic functioning piror to discharge.     Intensity: Minumum of 1-2 x/day, 30 to 90 minutes Frequency: 3 to 5 out of 7 days Duration/Length of Stay: 02/07/21 Treatment/Interventions: Cognitive remediation/compensation;Multimodal communication approach;Internal/external aids;Cueing hierarchy;Functional tasks;Patient/family education;Therapeutic Activities;Speech/Language facilitation;Environmental controls   Daily Session  Skilled Therapeutic Interventions:  Skilled treatment session focused on cognitive goals. Upon arrival, patient was awake in bed but with minimal verbal expression. Patient was incontinent of urine with minimal awareness. Patient sat EOB with supervision and required Min verbal cues for safety during ambulation to commode. Patient able to void on commode. Patient performed all peri care independently but required Min verbal cues for safety with donning a new brief, pants and top. Patient also performed basic self-care tasks at the sink with extra time. Patient with minimal verbal expression throughout session with intermittent laughing that was appropriate during interaction. Patient required intermittent verbal cues for termination of task and attend to tasks for ~2 minute intervals. Patient left upright in the wheelchair with alarm on and all needs within reach. Continue with current plan of care.      Pain No/Denies Pain   Therapy/Group: Individual Therapy  Raliegh Scobie, [place] 01/31/2021, 3:14 PM

## 2021-01-31 NOTE — Progress Notes (Signed)
Physical Therapy Weekly Progress Note  Patient Details  Name: Ann Chaney MRN: [id number] Date of Birth: February 08, 1946  Beginning of progress report period: January 24, 2021 End of progress report period: January 31, 2021  Today's Date: 01/31/2021 PT Individual Time: 1102-1159 PT Individual Time Calculation (min): 57 min   Patient has met 3 of 3 short term goals.  Patient grossly CGA/ supervision for OOB mobility. She continues to require simple cues and increased time due to delayed initiation and general hypokinesis. She scored a 35/56 on the BBS indicating increased risk for falling and completed the TUG in 22.1s with no AD and supervision indicating increased risk for falling and benefit from further intervention.   Patient continues to demonstrate the following deficits muscle weakness, decreased cardiorespiratoy endurance, decreased motor planning, decreased initiation, decreased attention, decreased awareness, decreased problem solving, decreased safety awareness, decreased memory, and delayed processing, and decreased sitting balance, decreased standing balance, decreased postural control, and decreased balance strategies and therefore will continue to benefit from skilled PT intervention to increase functional independence with mobility.  Patient .  Continue plan of care. progressing toward long term goals. PT Short Term Goals Week 1:  PT Short Term Goal 1 (Week 1): Patient will perform basic transfer with CGA using LRAD. PT Short Term Goal 1 - Progress (Week 1): Met PT Short Term Goal 2 (Week 1): Patient will ambulate >100 feet with min A using LRAD. PT Short Term Goal 2 - Progress (Week 1): Met PT Short Term Goal 3 (Week 1): Patient will initiate stair training PT Short Term Goal 3 - Progress (Week 1): Met Week 2:  PT Short Term Goal 1 (Week 2): STG= LTG based on ELOS  Skilled Therapeutic Interventions/Progress Updates:    Patient received sitting up in recliner, agreeable to  PT. She denies pain, but endorses fatigue reporting not sleeping well. She was able to ambulate to therapy gym with no AD and CGA/MinA. Decreased gait speed noted and patient had tendency to reach for objects to hold onto, but once had UE support- balance and posture did not improve. Patient completing Berg balance scale, scoring a 35/56 indicating increased risk for falling. Patient completing TUG with no AD and supervision in 22.1s, also indicating increased risk for falling. Patient ambulated to dayroom with no AD and close supervision. Kinetron at 40cm/s 5x2 mins. Patient ambulating ~236ft back to her room with no AD and CGA. Very slow gait speed. Patient unable to clarify whether this gait speed was near her baseline gait speed or not. Patient remaining up in recliner, seatbelt alarm on, call light within reach, dtr at bedside.   Therapy Documentation Precautions:  Precautions Precautions: Fall Precaution Comments: R hemiparesis, aphasia Restrictions Weight Bearing Restrictions: No   Therapy/Group: Individual Therapy  Debbora Dus 01/31/2021, 7:47 AM

## 2021-01-31 NOTE — Progress Notes (Signed)
Occupational Therapy Session Note  Patient Details  Name: Ann Chaney MRN: [id number] Date of Birth: 11/07/1945  Today's Date: 01/31/2021 OT Individual Time: 7096-2836 OT Individual Time Calculation (min): 23 min    Short Term Goals: Week 2:  OT Short Term Goal 1 (Week 2): LTG =STG 2/2 ELOS  Skilled Therapeutic Interventions/Progress Updates:    Pt received seated in recliner with family present, denies pain, agreeable to therapy. Session focus on dynamic standing balance, short term memory, sustained attention in prep for improved ADL/IADL/func mobility performance + decreased caregiver burden. Ambulated throughout session with no AD and CGA to min HHA, noted to furniture walk at times.   Standing at [place], completed trail making part A with the following results:  -completed in 4 min  53 secs with 6 errors and 32 interruptions, required occasional visual cues to locate next number, but pt able to independently state which number came next  Seated, played matching card game with overall max A to follow rule play after visual/verbal demonstration. Pt unable to recall card location if flipped upside down, but able to find all matches when cards flipped with number side up.  Pt able to independently recall room number and path find way back to room with no additional cuing.  Pt left seated in recliner with family present with safety belt alarm engaged, call bell in reach, and all immediate needs met.    Therapy Documentation Precautions:  Precautions Precautions: Fall Precaution Comments: R hemiparesis, aphasia Restrictions Weight Bearing Restrictions: No    Pain: denies   ADL: See Care Tool for more details.   Therapy/Group: Individual Therapy  Volanda Napoleon MS, OTR/L  01/31/2021, 2:31 PM

## 2021-01-31 NOTE — Progress Notes (Signed)
Occupational Therapy Session Note  Patient Details  Name: Ann Chaney MRN: [id number] Date of Birth: October 31, 1945  Today's Date: 01/31/2021 OT Individual Time: 0930-1015 OT Individual Time Calculation (min): 45 min    Short Term Goals: Week 1:  OT Short Term Goal 1 (Week 1): Patient will complete toilet transfer with CGA OT Short Term Goal 1 - Progress (Week 1): Met OT Short Term Goal 2 (Week 1): Pt will complete all steps of toothbrushing task with only min cues for sequencing OT Short Term Goal 2 - Progress (Week 1): Met OT Short Term Goal 3 (Week 1): Patient will complete LB ADLs with CGA OT Short Term Goal 3 - Progress (Week 1): Met Week 2:  OT Short Term Goal 1 (Week 2): LTG =STG 2/2 ELOS Week 3:     Skilled Therapeutic Interventions/Progress Updates:    Pt received in w/c finishing up taking meds with nursing. Pt engaged in bathing and dressing UB at the sink. Pt began to doff clothing and wash LB but realized she had already performed this (confirmed by previous therapist after an incontinent episode). Engaged in brushing in teeth in standing at the sink with distant supervision. Pt able to setup and begin task but required mod cues to terminate and finish task. Pt able to transition to making her bed with focus on transitional movements and dynamic balance. Pt ambulated down the hallway to linen cart with min guard to retrieve and carry items back. Pt has a very slow cadence. Pt able to don fitted sheet, top sheet, blanket and bed pad with extra time and close supervision as she moved around the bed. Transitioned into the recliner. Pt was able to read and engaged in social conversation about greeting cards (one of her hobbies). Pt left resting in the recliner with safety belt donned.   Therapy Documentation Precautions:  Precautions Precautions: Fall Precaution Comments: R hemiparesis, aphasia Restrictions Weight Bearing Restrictions: No  Pain:  No reports of pain in session   Other Treatments:     Therapy/Group: Individual Therapy  Willeen Cass [facility] 01/31/2021, 2:55 PM

## 2021-01-31 NOTE — Progress Notes (Signed)
PROGRESS NOTE   Subjective/Complaints:  Pt up in bed. Says she didn't sleep that well due to late night bm. Otherwise denies pain  ROS: Limited due to cognitive/behavioral    Objective:   No results found. No results for input(s): WBC, HGB, HCT, PLT in the last 72 hours.  No results for input(s): NA, K, CL, CO2, GLUCOSE, BUN, CREATININE, CALCIUM in the last 72 hours.   Intake/Output Summary (Last 24 hours) at 01/31/2021 0958 Last data filed at 01/31/2021 0720 Gross per 24 hour  Intake 478 ml  Output --  Net 478 ml        Physical Exam: Vital Signs Blood pressure 105/60, pulse 82, temperature 98.6 F (37 C), temperature source Oral, resp. rate 18, height 5\' 1"  (1.549 m), weight 57.4 kg, SpO2 100 %.   Constitutional: No distress . Vital signs reviewed. HEENT: NCAT, EOMI, oral membranes moist Neck: supple Cardiovascular: RRR without murmur. No JVD    Respiratory/Chest: CTA Bilaterally without wheezes or rales. Normal effort    GI/Abdomen: BS +, non-tender, non-distended Ext: no clubbing, cyanosis, or edema Psych: flat. Initially wouldn't speak when I came in Neuro:  Pt remains oriented to person and month. Slow processing. Normal language and speech. Limited insight and awareness. 4/5 in BUE and BLE Musculoskeletal: normal ROM    Assessment/Plan: 1. Functional deficits which require 3+ hours per day of interdisciplinary therapy in a comprehensive inpatient rehab setting. Physiatrist is providing close team supervision and 24 hour management of active medical problems listed below. Physiatrist and rehab team continue to assess barriers to discharge/monitor patient progress toward functional and medical goals  Care Tool:  Bathing    Body parts bathed by patient: Right arm, Chest, Left arm, Abdomen, Front perineal area, Buttocks, Right upper leg, Left upper leg, Right lower leg, Left lower leg, Face          Bathing assist Assist Level: Contact Guard/Touching assist     Upper Body Dressing/Undressing Upper body dressing   What is the patient wearing?: Hospital gown only    Upper body assist Assist Level: Supervision/Verbal cueing    Lower Body Dressing/Undressing Lower body dressing      What is the patient wearing?: Hospital gown only     Lower body assist Assist for lower body dressing: Contact Guard/Touching assist     Toileting Toileting    Toileting assist Assist for toileting: Contact Guard/Touching assist     Transfers Chair/bed transfer  Transfers assist     Chair/bed transfer assist level: Contact Guard/Touching assist     Locomotion Ambulation   Ambulation assist      Assist level: Minimal Assistance - Patient > 75% Assistive device: Hand held assist Max distance: 18 ft   Walk 10 feet activity   Assist     Assist level: Minimal Assistance - Patient > 75% Assistive device: Hand held assist   Walk 50 feet activity   Assist Walk 50 feet with 2 turns activity did not occur: Safety/medical concerns         Walk 150 feet activity   Assist Walk 150 feet activity did not occur: Safety/medical concerns  Walk 10 feet on uneven surface  activity   Assist Walk 10 feet on uneven surfaces activity did not occur: Safety/medical concerns         Wheelchair     Assist Is the patient using a wheelchair?: Yes Type of Wheelchair: Manual    Wheelchair assist level: Total Assistance - Patient < 25% Max wheelchair distance: 6 feet, unable to attend or motor plan with max cues    Wheelchair 50 feet with 2 turns activity    Assist        Assist Level: Total Assistance - Patient < 25%   Wheelchair 150 feet activity     Assist      Assist Level: Total Assistance - Patient < 25%   Blood pressure 105/60, pulse 82, temperature 98.6 F (37 C), temperature source Oral, resp. rate 18, height 5\' 1"  (1.549 m), weight  57.4 kg, SpO2 100 %.  Medical Problem List and Plan: 1.  Impaired mobility following large parasagittal meningioma resection             -patient may shower               -ELOS/Goals: 10-14 days S  -Continue CIR therapies including PT, OT, and SLP  2.  Antithrombotics: -DVT/anticoagulation:  Mechanical: Sequential compression devices, below knee Bilateral lower extremities             -antiplatelet therapy: N/a 3. Pain Management: Continue hydrocodone prn.  4. Mood: LCSW to follow for evaluation and support.              -antipsychotic agents: N/A 5. Neuropsych: This patient is not fully capable of making decisions on her own behalf.  11/10 begin trial of ritalin to improve arousal/initiation 5mg  bid 6. Skin/Wound Care: crani incision healing nicely 7. Fluids/Electrolytes/Nutrition: Monitor I/O.              --eating well    8. HTN: Monitor BP TID- Continue Lisinopril and Toprol.              --11/4 BP/HR stable, HR sl brady 9. T2DM: Hgb A1C-6.2 and well controlled on Metformin, Actos and Glipizide             --Monitor BS ac/hs and use SSI for elevated BS.            CBG (last 3)  Recent Labs    01/30/21 1708 01/30/21 2054 01/31/21 0601  GLUCAP 118* 238* 139*   11/10-hypoglycemic yesterday. Metformin and glipizide held   -observe for pattern today.  10. Goiter/Hyperthyroidism: Continue Methimazole.  11. Seizure pophylaxis: Has been seizure free  since surgery. Keppra stopped 12. Constipation: Will add Senna S at bedtime.  13. Hyponatremia: Na stable at 137.  14. Leucocytosis: resolved, d/t steroids   15. Acute blood loss anemia: Hgb 11.8 on 11.2, down from 13.1 12 days prior.   Hgb 9.7 11/3, likely post-op, fluids. No clinical signs of blood loss -9.8 11/7.  16. Open angle glaucoma:  Stable on Xalatan.  17. Insomnia:  continue low dose melatonin.     LOS: 8 days A FACE TO FACE EVALUATION WAS PERFORMED  Meredith Staggers 01/31/2021, 9:58 AM

## 2021-02-01 LAB — GLUCOSE, CAPILLARY
Glucose-Capillary: 166 mg/dL — ABNORMAL HIGH (ref 70–99)
Glucose-Capillary: 107 mg/dL — ABNORMAL HIGH (ref 70–99)
Glucose-Capillary: 117 mg/dL — ABNORMAL HIGH (ref 70–99)
Glucose-Capillary: 129 mg/dL — ABNORMAL HIGH (ref 70–99)
Glucose-Capillary: 61 mg/dL — ABNORMAL LOW (ref 70–99)
Glucose-Capillary: 60 mg/dL — ABNORMAL LOW (ref 70–99)
Glucose-Capillary: 92 mg/dL (ref 70–99)
Glucose-Capillary: 166 mg/dL — ABNORMAL HIGH (ref 70–99)

## 2021-02-01 MED ORDER — TRAZODONE HCL 50 MG PO TABS
25.0000 mg | ORAL_TABLET | Freq: Every day | ORAL | Status: DC
  Administered 2021-02-01 – 2021-02-06 (×5): 25 mg via ORAL
  Filled 2021-02-01 (×6): qty 1

## 2021-02-01 MED ORDER — TRAZODONE HCL 50 MG PO TABS
25.0000 mg | ORAL_TABLET | Freq: Every evening | ORAL | Status: DC | PRN
  Administered 2021-02-02: 25 mg via ORAL

## 2021-02-01 NOTE — Progress Notes (Signed)
Occupational Therapy Session Note  Patient Details  Name: Ann Chaney MRN: [id number] Date of Birth: 10/12/1945  Today's Date: 02/01/2021 OT Individual Time: 0901-1000 OT Individual Time Calculation (min): 59 min   OT Individual Time: 1403-1430 OT Individual Time Calculation (min): 27 min   Short Term Goals: Week 1:  OT Short Term Goal 1 (Week 1): Patient will complete toilet transfer with CGA OT Short Term Goal 1 - Progress (Week 1): Met OT Short Term Goal 2 (Week 1): Pt will complete all steps of toothbrushing task with only min cues for sequencing OT Short Term Goal 2 - Progress (Week 1): Met OT Short Term Goal 3 (Week 1): Patient will complete LB ADLs with CGA OT Short Term Goal 3 - Progress (Week 1): Met Week 2:  OT Short Term Goal 1 (Week 2): LTG =STG 2/2 ELOS  Skilled Therapeutic Interventions/Progress Updates:  Session 1: Patient met lying supine in bed in agreement with OT treatment session. Mild pain reported in L thigh. Unable to recall date of onset. RN made aware. Patient found to be saturated in urine. Min guard and HHA for transfer to [facility] over standard height toilet in bathroom. Patient able to doff soiled clothing with supervision A and complete 3/3 parts of toileting task with supervision A to [place] guard. Bathing at shower level with supervision A grossly and unilateral UE support on grab bar to wash buttock and front perineal area. Able to don UB clothing in standing with close supervision A and don LB clothing in sitting/standing with Min guard for steadying. Oral hygiene standing at sink level with supervision A. Min cues to sequence task. Daughter present in room for last 30 min of session for family education including patient CLOF and necessary level of physical/cognitive assist with ADLs. Education also provided on home set-up and acquisition of DME. Daughter expressed verbal understanding. Session concluded with patient seated with call bell within reach and daughter  at beside for SLP family ed session.    Session 2: Short afternoon treatment session with focus on continued family education and therapeutic activity. Upon entry daughter assisting patient with toileting task followed by handwashing at sink level. Functional mobility with Min guard to supervision A to and from hospital gift shop (Min guard with turns). Patient given verbal list of 3 items to point out in gift shop. Able to immediately recall 3/3 items. Able to recall 2/3 items after 1-2 min. Patient then able to find way back to rehab floor with Min question cues. In rehab bathroom, daughter assisted with tub/shower transfer giving patient appropriate cueing for hand placement on grab bars. Session concluded with patient lying supine in bed with call bell within reach. Daughter present at bedside.   Therapy Documentation Precautions:  Precautions Precautions: Fall Precaution Comments: R hemiparesis, aphasia Restrictions Weight Bearing Restrictions: No General:    Therapy/Group: Individual Therapy  Verginia Toohey R [place] 02/01/2021, 9:00 AM

## 2021-02-01 NOTE — Plan of Care (Signed)
  Problem: Consults Goal: RH BRAIN INJURY PATIENT EDUCATION Description: Description: See Patient Education module for eduction specifics Outcome: Progressing Goal: Skin Care Protocol Initiated - if Braden Score 18 or less Description: If consults are not indicated, leave blank or document N/A Outcome: Progressing Goal: Diabetes Guidelines if Diabetic/Glucose > 140 Description: If diabetic or lab glucose is > 140 mg/dl - Initiate Diabetes/Hyperglycemia Guidelines & Document Interventions  Outcome: Progressing   Problem: RH BOWEL ELIMINATION Goal: RH STG MANAGE BOWEL WITH ASSISTANCE Description: STG Manage Bowel with Min Assistance. Outcome: Progressing Goal: RH STG MANAGE BOWEL W/MEDICATION W/ASSISTANCE Description: STG Manage Bowel with Medication with [place]. Outcome: Progressing   Problem: RH BLADDER ELIMINATION Goal: RH STG MANAGE BLADDER WITH ASSISTANCE Description: STG Manage Bladder With Min Assistance Outcome: Progressing Goal: RH STG MANAGE BLADDER WITH MEDICATION WITH ASSISTANCE Description: STG Manage Bladder With Medication With [place]. Outcome: Progressing   Problem: RH SKIN INTEGRITY Goal: RH STG MAINTAIN SKIN INTEGRITY WITH ASSISTANCE Description: STG Maintain Skin Integrity With [place]. Outcome: Progressing Goal: RH STG ABLE TO PERFORM INCISION/WOUND CARE W/ASSISTANCE Description: STG Able To Perform Incision/Wound Care With [employer]. Outcome: Progressing   Problem: RH SAFETY Goal: RH STG ADHERE TO SAFETY PRECAUTIONS W/ASSISTANCE/DEVICE Description: STG Adhere to Safety Precautions With cues and reminders. Outcome: Progressing Goal: RH STG DECREASED RISK OF FALL WITH ASSISTANCE Description: STG Decreased Risk of Fall With [employer]. Outcome: Progressing   Problem: RH KNOWLEDGE DEFICIT BRAIN INJURY Goal: RH STG INCREASE KNOWLEDGE OF SELF CARE AFTER BRAIN INJURY Description: Patient will demonstrate knowledge of medication  management, diabetes management, skin/wound care with educational materials and handouts provided by staff independently at discharge. Outcome: Progressing

## 2021-02-01 NOTE — Progress Notes (Signed)
PROGRESS NOTE   Subjective/Complaints: She states that she hasn't been sleeping well and that she had a long night. Otherwise doing ok this morning  ROS: Patient denies fever, rash, sore throat, blurred vision, nausea, vomiting, diarrhea, cough, shortness of breath or chest pain, joint or back pain, headache, or mood change.    Objective:   No results found. No results for input(s): WBC, HGB, HCT, PLT in the last 72 hours.  No results for input(s): NA, K, CL, CO2, GLUCOSE, BUN, CREATININE, CALCIUM in the last 72 hours.   Intake/Output Summary (Last 24 hours) at 02/01/2021 1049 Last data filed at 02/01/2021 0907 Gross per 24 hour  Intake 595 ml  Output --  Net 595 ml        Physical Exam: Vital Signs Blood pressure 104/62, pulse 82, temperature 98.8 F (37.1 C), temperature source Oral, resp. rate 18, height 5\' 1"  (1.549 m), weight 57.4 kg, SpO2 100 %.   Constitutional: No distress . Vital signs reviewed. HEENT: NCAT, EOMI, oral membranes moist Neck: supple Cardiovascular: RRR without murmur. No JVD    Respiratory/Chest: CTA Bilaterally without wheezes or rales. Normal effort    GI/Abdomen: BS +, non-tender, non-distended Ext: no clubbing, cyanosis, or edema Psych: flat. Initially wouldn't speak when I came in Neuro:  Pt remains oriented to person and month. Seemed a little more on point and more initiative today. Normal language and speech. Limited insight and awareness. 4/5 in BUE and BLE Musculoskeletal: normal ROM    Assessment/Plan: 1. Functional deficits which require 3+ hours per day of interdisciplinary therapy in a comprehensive inpatient rehab setting. Physiatrist is providing close team supervision and 24 hour management of active medical problems listed below. Physiatrist and rehab team continue to assess barriers to discharge/monitor patient progress toward functional and medical goals  Care  Tool:  Bathing    Body parts bathed by patient: Right arm, Chest, Left arm, Abdomen, Front perineal area, Buttocks, Right upper leg, Left upper leg, Right lower leg, Left lower leg, Face         Bathing assist Assist Level: Supervision/Verbal cueing     Upper Body Dressing/Undressing Upper body dressing   What is the patient wearing?: Pull over shirt    Upper body assist Assist Level: Set up assist    Lower Body Dressing/Undressing Lower body dressing      What is the patient wearing?: Pants, Incontinence brief     Lower body assist Assist for lower body dressing: Contact Guard/Touching assist     Toileting Toileting    Toileting assist Assist for toileting: Contact Guard/Touching assist     Transfers Chair/bed transfer  Transfers assist     Chair/bed transfer assist level: Contact Guard/Touching assist     Locomotion Ambulation   Ambulation assist      Assist level: Minimal Assistance - Patient > 75% Assistive device: Hand held assist Max distance: 18 ft   Walk 10 feet activity   Assist     Assist level: Minimal Assistance - Patient > 75% Assistive device: Hand held assist   Walk 50 feet activity   Assist Walk 50 feet with 2 turns activity did not occur: Safety/medical  concerns         Walk 150 feet activity   Assist Walk 150 feet activity did not occur: Safety/medical concerns         Walk 10 feet on uneven surface  activity   Assist Walk 10 feet on uneven surfaces activity did not occur: Safety/medical concerns         Wheelchair     Assist Is the patient using a wheelchair?: Yes Type of Wheelchair: Manual    Wheelchair assist level: Total Assistance - Patient < 25% Max wheelchair distance: 6 feet, unable to attend or motor plan with max cues    Wheelchair 50 feet with 2 turns activity    Assist        Assist Level: Total Assistance - Patient < 25%   Wheelchair 150 feet activity     Assist       Assist Level: Total Assistance - Patient < 25%   Blood pressure 104/62, pulse 82, temperature 98.8 F (37.1 C), temperature source Oral, resp. rate 18, height 5\' 1"  (1.549 m), weight 57.4 kg, SpO2 100 %.  Medical Problem List and Plan: 1.  Impaired mobility following large parasagittal meningioma resection             -patient may shower               -ELOS/Goals: 10-14 days Sup  -Continue CIR therapies including PT, OT, and SLP  2.  Antithrombotics: -DVT/anticoagulation:  Mechanical: Sequential compression devices, below knee Bilateral lower extremities             -antiplatelet therapy: N/a 3. Pain Management: Continue hydrocodone prn.  4. Mood/sleep: begin low dose trazodone for sleep 25mg , pt agrees  -ritalin should help also. Continue melatonin             -antipsychotic agents: N/A 5. Neuropsych: This patient is not fully capable of making decisions on her own behalf.  11/10 beginning trial of ritalin 5mg  bid to improve arousal/initiation. Spoke to daughter about its use yesterday and she was on board 6. Skin/Wound Care: crani incision healing nicely 7. Fluids/Electrolytes/Nutrition: Monitor I/O.              --eating well    8. HTN: Monitor BP TID- Continue Lisinopril and Toprol.              --11/4 BP/HR stable, HR sl brady 9. T2DM: Hgb A1C-6.2 and well controlled on Metformin, Actos and Glipizide             --Monitor BS ac/hs and use SSI for elevated BS.            CBG (last 3)  Recent Labs    01/31/21 1647 01/31/21 2252 02/01/21 0644  GLUCAP 166* 107* 117*   11/10-hypoglycemic yesterday. Metformin and glipizide held  11/11 cbg's holding---continue with current plan 10. Goiter/Hyperthyroidism: Continue Methimazole.  11. Seizure pophylaxis: Has been seizure free  since surgery. Keppra stopped 12. Constipation: Will add Senna S at bedtime.  13. Hyponatremia: Na stable at 137.  14. Leucocytosis: resolved, d/t steroids   15. Acute blood loss anemia: Hgb 11.8 on  11.2, down from 13.1 12 days prior.   Hgb 9.7 11/3, likely post-op, fluids. No clinical signs of blood loss -9.8 11/7.  16. Open angle glaucoma:  Stable on Xalatan.        LOS: 9 days A FACE TO FACE EVALUATION WAS PERFORMED  Meredith Staggers 02/01/2021, 10:49 AM

## 2021-02-01 NOTE — Progress Notes (Signed)
Speech Language Pathology Daily Session Note  Patient Details  Name: Ann Chaney MRN: [id number] Date of Birth: [birth year]  Today's Date: 02/01/2021 SLP Individual Time: 1000-1045 SLP Individual Time Calculation (min): 45 min  Short Term Goals: Week 2: SLP Short Term Goal 1 (Week 2): STGs=LTGs due to ELOS  Skilled Therapeutic Interventions: Pt received upright in wheelchair and agreeable to skilled ST intervention with focus on cognitive goals and family edu with daughter, Ann Chaney. SLP facilitated session by providing min A semantic cues for generative naming task. Pt sustained attention for 35 minute duration with min A verbal cues and eventually required mod A verbal cues and prolonged processing time. SLP educated daughter on cognitive-communication strategies to maximize functional recall and attention, as well as beneficial activities that may be enjoyable as well as aimed to promote attention, problem solving, recall, and information processing. Daughter displayed understanding through teach back and demonstration using novel card games [place], Blink, and Scattergories. Patient was left in chair with alarm activated and immediate needs within reach at end of session. Continue per current plan of care.       Pain Pain Assessment Pain Scale: 0-10 Pain Score: 0-No pain  Therapy/Group: Individual Therapy  Patty Sermons 02/01/2021, 12:31 PM

## 2021-02-01 NOTE — Progress Notes (Signed)
Hypoglycemic Event  CBG: 61   Treatment: 8 oz juice/soda  Symptoms: None  Follow-up CBG: Time:1750 CBG Result:92  Possible Reasons for Event: Unknown  Comments/MD notified:Charge notified; CBG increased above MD notify range.    Deonne Rooks L Roland Lipke

## 2021-02-01 NOTE — Progress Notes (Signed)
Physical Therapy Session Note  Patient Details  Name: Ann Chaney MRN: [id number] Date of Birth: 12/05/1945  Today's Date: 02/01/2021 PT Individual Time: 1102-1202 PT Individual Time Calculation (min): 60 min   Short Term Goals: Week 1:  PT Short Term Goal 1 (Week 1): Patient will perform basic transfer with CGA using LRAD. PT Short Term Goal 1 - Progress (Week 1): Met PT Short Term Goal 2 (Week 1): Patient will ambulate >100 feet with min A using LRAD. PT Short Term Goal 2 - Progress (Week 1): Met PT Short Term Goal 3 (Week 1): Patient will initiate stair training PT Short Term Goal 3 - Progress (Week 1): Met Week 2:  PT Short Term Goal 1 (Week 2): STG= LTG based on ELOS  Skilled Therapeutic Interventions/Progress Updates:   Pt received oob in wc w/daughter, Kim at side.  Session focusing on Family ED, dynamic balance, functional gait. Pt transported to gym. Gait 175ft w/no AD and cga, mild unsteadiness but overall improved w/increased cadence, decreased deviations. Discussed balance deficits, fall risk as indicated by TUG/BERG scores and safety implications at home.  Aslo educated daughter and daughter observed current treatments to address balance/fall risk.  Static standing challenges include: Feet together - supervision only Tandem - difficulty w/motor planning to achieve position, once in semitandem - min assist, R tendency, full tandem mod assist Side stepping - slowed to R but cga 5 ft x 8 Backing - cga only Standing heel raises - unable without UE stabilizing support, w/hands on table performs 10 reps x 2 w/supervision, cues Marching in place no UE support - unable to coordinate/plan w/RLE, cga w/LLE Daughter w/questions regarding home exercises for general strength, educated HEP would be provided closer to DC, discussed exploring "Silver Sneakers" program if this was option w/pt insurance, discussed options w/this benefit.    Functional gait: Weaving thru cones,  stepping over 5x5 wedge, standing on foam, picking up cones from floor - cga, mild unsteadiness w/turns but no blance loss  Gait x 150ft carrying 4lb weighted bar w/both hands w/cga Daughter follows w/wc and instructed w/brakes, legrest management. Daughter then instructed w/guarding technique using gait belt.  Daughter able to demonstrate safe handling w/wc to commode and short distance gait commode to bed/wc in room. Also instructed w/donning/doffing arming chair alarm belt.  Nursing notified daughter cleared to assist pt w/this. Pt left oob in wc w/alarm belt set and needs in reach  Therapy Documentation Precautions:  Precautions Precautions: Fall Precaution Comments: R hemiparesis, aphasia Restrictions Weight Bearing Restrictions: No    Therapy/Group: Individual Therapy  , PT    M  02/01/2021, 4:33 PM  

## 2021-02-02 LAB — GLUCOSE, CAPILLARY
Glucose-Capillary: 122 mg/dL — ABNORMAL HIGH (ref 70–99)
Glucose-Capillary: 40 mg/dL — CL (ref 70–99)
Glucose-Capillary: 47 mg/dL — ABNORMAL LOW (ref 70–99)
Glucose-Capillary: 48 mg/dL — ABNORMAL LOW (ref 70–99)
Glucose-Capillary: 52 mg/dL — ABNORMAL LOW (ref 70–99)
Glucose-Capillary: 59 mg/dL — ABNORMAL LOW (ref 70–99)
Glucose-Capillary: 67 mg/dL — ABNORMAL LOW (ref 70–99)
Glucose-Capillary: 98 mg/dL (ref 70–99)
Glucose-Capillary: 239 mg/dL — ABNORMAL HIGH (ref 70–99)
Glucose-Capillary: 103 mg/dL — ABNORMAL HIGH (ref 70–99)

## 2021-02-02 MED ORDER — INSULIN ASPART 100 UNIT/ML IJ SOLN: 0.0000 [IU] | Freq: Every day | INTRAMUSCULAR | Status: DC

## 2021-02-02 MED ORDER — INSULIN ASPART 100 UNIT/ML IJ SOLN
0.0000 [IU] | Freq: Three times a day (TID) | INTRAMUSCULAR | Status: DC
  Administered 2021-02-02: 2 [IU] via SUBCUTANEOUS

## 2021-02-02 NOTE — Progress Notes (Signed)
Hypoglycemic Event  CBG: 48  Treatment: 8 oz juice/soda  Symptoms: None  Follow-up CBG: Time:1159 CBG Result:52  Possible Reasons for Event: Unknown  Comments/MD notified:MD Lovorn notified. Patient was given extra orange juice and ordered a fruit platter to increase glucose level. MD changed sliding scale order and glipizide was discontinued. After fruit platter and juice patient blood glucose stabilized to CBG of 98.    Ann Chaney

## 2021-02-02 NOTE — Plan of Care (Signed)
  Problem: Consults Goal: RH BRAIN INJURY PATIENT EDUCATION Description: Description: See Patient Education module for eduction specifics Outcome: Progressing Goal: Skin Care Protocol Initiated - if Braden Score 18 or less Description: If consults are not indicated, leave blank or document N/A Outcome: Progressing Goal: Diabetes Guidelines if Diabetic/Glucose > 140 Description: If diabetic or lab glucose is > 140 mg/dl - Initiate Diabetes/Hyperglycemia Guidelines & Document Interventions  Outcome: Progressing   Problem: RH BOWEL ELIMINATION Goal: RH STG MANAGE BOWEL WITH ASSISTANCE Description: STG Manage Bowel with Min Assistance. Outcome: Progressing Goal: RH STG MANAGE BOWEL W/MEDICATION W/ASSISTANCE Description: STG Manage Bowel with Medication with [place]. Outcome: Progressing   Problem: RH BLADDER ELIMINATION Goal: RH STG MANAGE BLADDER WITH ASSISTANCE Description: STG Manage Bladder With Min Assistance Outcome: Progressing Goal: RH STG MANAGE BLADDER WITH MEDICATION WITH ASSISTANCE Description: STG Manage Bladder With Medication With [place]. Outcome: Progressing   Problem: RH SKIN INTEGRITY Goal: RH STG MAINTAIN SKIN INTEGRITY WITH ASSISTANCE Description: STG Maintain Skin Integrity With [place]. Outcome: Progressing Goal: RH STG ABLE TO PERFORM INCISION/WOUND CARE W/ASSISTANCE Description: STG Able To Perform Incision/Wound Care With [employer]. Outcome: Progressing   Problem: RH SAFETY Goal: RH STG ADHERE TO SAFETY PRECAUTIONS W/ASSISTANCE/DEVICE Description: STG Adhere to Safety Precautions With cues and reminders. Outcome: Progressing Goal: RH STG DECREASED RISK OF FALL WITH ASSISTANCE Description: STG Decreased Risk of Fall With [employer]. Outcome: Progressing   Problem: RH KNOWLEDGE DEFICIT BRAIN INJURY Goal: RH STG INCREASE KNOWLEDGE OF SELF CARE AFTER BRAIN INJURY Description: Patient will demonstrate knowledge of medication  management, diabetes management, skin/wound care with educational materials and handouts provided by staff independently at discharge. Outcome: Progressing

## 2021-02-02 NOTE — Progress Notes (Signed)
Occupational Therapy Session Note  Patient Details  Name: CAROLYNA YERIAN MRN: [id number] Date of Birth: 1945/08/16  Today's Date: 02/03/2021 OT Group Time:  - 60 minutes missed    Skilled Therapeutic Interventions/Progress Updates:    Pt declined participation in group today. 60 minutes missed   Therapy Documentation Precautions:  Precautions Precautions: Fall Precaution Comments: R hemiparesis, aphasia Restrictions Weight Bearing Restrictions: No  Pain: Pain Assessment Pain Score: 0-No pain   Therapy/Group: Group Therapy  Patsy Zaragoza A Daviana Haymaker 02/03/2021, 12:29 PM

## 2021-02-03 LAB — GLUCOSE, CAPILLARY
Glucose-Capillary: 127 mg/dL — ABNORMAL HIGH (ref 70–99)
Glucose-Capillary: 134 mg/dL — ABNORMAL HIGH (ref 70–99)
Glucose-Capillary: 125 mg/dL — ABNORMAL HIGH (ref 70–99)
Glucose-Capillary: 152 mg/dL — ABNORMAL HIGH (ref 70–99)

## 2021-02-03 NOTE — Plan of Care (Signed)
  Problem: Consults Goal: RH BRAIN INJURY PATIENT EDUCATION Description: Description: See Patient Education module for eduction specifics Outcome: Progressing Goal: Skin Care Protocol Initiated - if Braden Score 18 or less Description: If consults are not indicated, leave blank or document N/A Outcome: Progressing Goal: Diabetes Guidelines if Diabetic/Glucose > 140 Description: If diabetic or lab glucose is > 140 mg/dl - Initiate Diabetes/Hyperglycemia Guidelines & Document Interventions  Outcome: Progressing   Problem: RH BOWEL ELIMINATION Goal: RH STG MANAGE BOWEL WITH ASSISTANCE Description: STG Manage Bowel with Min Assistance. Outcome: Progressing Goal: RH STG MANAGE BOWEL W/MEDICATION W/ASSISTANCE Description: STG Manage Bowel with Medication with [place]. Outcome: Progressing   Problem: RH BLADDER ELIMINATION Goal: RH STG MANAGE BLADDER WITH ASSISTANCE Description: STG Manage Bladder With Min Assistance Outcome: Progressing Goal: RH STG MANAGE BLADDER WITH MEDICATION WITH ASSISTANCE Description: STG Manage Bladder With Medication With [place]. Outcome: Progressing   Problem: RH SKIN INTEGRITY Goal: RH STG MAINTAIN SKIN INTEGRITY WITH ASSISTANCE Description: STG Maintain Skin Integrity With [place]. Outcome: Progressing Goal: RH STG ABLE TO PERFORM INCISION/WOUND CARE W/ASSISTANCE Description: STG Able To Perform Incision/Wound Care With [employer]. Outcome: Progressing   Problem: RH SAFETY Goal: RH STG ADHERE TO SAFETY PRECAUTIONS W/ASSISTANCE/DEVICE Description: STG Adhere to Safety Precautions With cues and reminders. Outcome: Progressing Goal: RH STG DECREASED RISK OF FALL WITH ASSISTANCE Description: STG Decreased Risk of Fall With [employer]. Outcome: Progressing   Problem: RH KNOWLEDGE DEFICIT BRAIN INJURY Goal: RH STG INCREASE KNOWLEDGE OF SELF CARE AFTER BRAIN INJURY Description: Patient will demonstrate knowledge of medication  management, diabetes management, skin/wound care with educational materials and handouts provided by staff independently at discharge. Outcome: Progressing

## 2021-02-03 NOTE — Progress Notes (Signed)
PROGRESS NOTE   Subjective/Complaints: Pt feels well. Eating breakfast. Cbg's low but asymptomatic per rn  ROS: Limited due to cognitive/behavioral    Objective:   No results found. No results for input(s): WBC, HGB, HCT, PLT in the last 72 hours.  No results for input(s): NA, K, CL, CO2, GLUCOSE, BUN, CREATININE, CALCIUM in the last 72 hours.   Intake/Output Summary (Last 24 hours) at 02/03/2021 0834 Last data filed at 02/02/2021 1850 Gross per 24 hour  Intake 900 ml  Output --  Net 900 ml        Physical Exam: Vital Signs Blood pressure (!) 143/67, pulse 79, temperature 98.4 F (36.9 C), resp. rate 14, height 5\' 1"  (1.549 m), weight 57.4 kg, SpO2 100 %.   Constitutional: No distress . Vital signs reviewed. HEENT: NCAT, EOMI, oral membranes moist Neck: supple Cardiovascular: RRR without murmur. No JVD    Respiratory/Chest: CTA Bilaterally without wheezes or rales. Normal effort    GI/Abdomen: BS +, non-tender, non-distended Ext: no clubbing, cyanosis, or edema Psych: pleasant and cooperative, more engaging Neuro:  Pt remains oriented to person and month. Seemed a little more on point and more initiative today. Normal language and speech. Limited insight and awareness. 4/5 in BUE and BLE Musculoskeletal: normal ROM    Assessment/Plan: 1. Functional deficits which require 3+ hours per day of interdisciplinary therapy in a comprehensive inpatient rehab setting. Physiatrist is providing close team supervision and 24 hour management of active medical problems listed below. Physiatrist and rehab team continue to assess barriers to discharge/monitor patient progress toward functional and medical goals  Care Tool:  Bathing    Body parts bathed by patient: Right arm, Chest, Left arm, Abdomen, Front perineal area, Buttocks, Right upper leg, Left upper leg, Right lower leg, Left lower leg, Face         Bathing  assist Assist Level: Supervision/Verbal cueing     Upper Body Dressing/Undressing Upper body dressing   What is the patient wearing?: Pull over shirt    Upper body assist Assist Level: Set up assist    Lower Body Dressing/Undressing Lower body dressing      What is the patient wearing?: Pants, Incontinence brief     Lower body assist Assist for lower body dressing: Contact Guard/Touching assist     Toileting Toileting    Toileting assist Assist for toileting: Contact Guard/Touching assist     Transfers Chair/bed transfer  Transfers assist     Chair/bed transfer assist level: Contact Guard/Touching assist     Locomotion Ambulation   Ambulation assist      Assist level: Minimal Assistance - Patient > 75% Assistive device: Hand held assist Max distance: 18 ft   Walk 10 feet activity   Assist     Assist level: Minimal Assistance - Patient > 75% Assistive device: Hand held assist   Walk 50 feet activity   Assist Walk 50 feet with 2 turns activity did not occur: Safety/medical concerns         Walk 150 feet activity   Assist Walk 150 feet activity did not occur: Safety/medical concerns         Walk 10 feet  on uneven surface  activity   Assist Walk 10 feet on uneven surfaces activity did not occur: Safety/medical concerns         Wheelchair     Assist Is the patient using a wheelchair?: Yes Type of Wheelchair: Manual    Wheelchair assist level: Total Assistance - Patient < 25% Max wheelchair distance: 6 feet, unable to attend or motor plan with max cues    Wheelchair 50 feet with 2 turns activity    Assist        Assist Level: Total Assistance - Patient < 25%   Wheelchair 150 feet activity     Assist      Assist Level: Total Assistance - Patient < 25%   Blood pressure (!) 143/67, pulse 79, temperature 98.4 F (36.9 C), resp. rate 14, height 5\' 1"  (1.549 m), weight 57.4 kg, SpO2 100 %.  Medical Problem  List and Plan: 1.  Impaired mobility following large parasagittal meningioma resection             -patient may shower               -ELOS/Goals: 10-14 days Sup  -Continue CIR therapies including PT, OT, and SLP  2.  Antithrombotics: -DVT/anticoagulation:  Mechanical: Sequential compression devices, below knee Bilateral lower extremities             -antiplatelet therapy: N/a 3. Pain Management: Continue hydrocodone prn.  4. Mood/sleep: begin low dose trazodone for sleep 25mg   -ritalin. Continue melatonin             -antipsychotic agents: N/A 5. Neuropsych: This patient is not fully capable of making decisions on her own behalf.  11/10 continue trial of ritalin 5mg  bid to improve arousal/initiation. Spoke to daughter about its use yesterday and she was on board 6. Skin/Wound Care: crani incision healing nicely 7. Fluids/Electrolytes/Nutrition: Monitor I/O.              --eating well    8. HTN: Monitor BP TID- Continue Lisinopril and Toprol.              --11/4 BP/HR stable, HR sl brady 9. T2DM: Hgb A1C-6.2 and well controlled on Metformin, Actos and Glipizide             --Monitor BS ac/hs and use SSI for elevated BS.            CBG (last 3)  Recent Labs    02/02/21 1657 02/02/21 2111 02/03/21 0603  GLUCAP 239* 103* 127*      11/12 cbg's low yesterday. Glipizide stopped. Now only on metformin and actos--continue these today and observe pattern 10. Goiter/Hyperthyroidism: Continue Methimazole.  11. Seizure pophylaxis: Has been seizure free  since surgery. Keppra stopped 12. Constipation: Will add Senna S at bedtime.  13. Hyponatremia: Na stable at 137.  14. Leucocytosis: resolved, d/t steroids   15. Acute blood loss anemia: Hgb 11.8 on 11.2, down from 13.1 12 days prior.   Hgb 9.7 11/3, likely post-op, fluids. No clinical signs of blood loss -9.8 11/7.  16. Open angle glaucoma:  Stable on Xalatan.        LOS: 11 days A FACE TO FACE EVALUATION WAS PERFORMED  Meredith Staggers 02/03/2021, 8:34 AM

## 2021-02-04 LAB — CBC
HCT: 32.5 % — ABNORMAL LOW (ref 36.0–46.0)
Hemoglobin: 10.4 g/dL — ABNORMAL LOW (ref 12.0–15.0)
MCH: 24.7 pg — ABNORMAL LOW (ref 26.0–34.0)
MCHC: 32 g/dL (ref 30.0–36.0)
MCV: 77.2 fL — ABNORMAL LOW (ref 80.0–100.0)
Platelets: 328 10*3/uL (ref 150–400)
RBC: 4.21 MIL/uL (ref 3.87–5.11)
RDW: 17.4 % — ABNORMAL HIGH (ref 11.5–15.5)
WBC: 4.4 10*3/uL (ref 4.0–10.5)
nRBC: 0 % (ref 0.0–0.2)

## 2021-02-04 LAB — GLUCOSE, CAPILLARY
Glucose-Capillary: 53 mg/dL — ABNORMAL LOW (ref 70–99)
Glucose-Capillary: 135 mg/dL — ABNORMAL HIGH (ref 70–99)
Glucose-Capillary: 96 mg/dL (ref 70–99)
Glucose-Capillary: 129 mg/dL — ABNORMAL HIGH (ref 70–99)
Glucose-Capillary: 154 mg/dL — ABNORMAL HIGH (ref 70–99)

## 2021-02-04 LAB — BASIC METABOLIC PANEL
Anion gap: 7 (ref 5–15)
BUN: 13 mg/dL (ref 8–23)
CO2: 23 mmol/L (ref 22–32)
Calcium: 9 mg/dL (ref 8.9–10.3)
Chloride: 107 mmol/L (ref 98–111)
Creatinine, Ser: 0.82 mg/dL (ref 0.44–1.00)
GFR, Estimated: >60 mL/min (ref >60)
Glucose, Bld: 134 mg/dL — ABNORMAL HIGH (ref 70–99)
Potassium: 5 mmol/L (ref 3.5–5.1)
Sodium: 137 mmol/L (ref 135–145)

## 2021-02-04 NOTE — Progress Notes (Signed)
Patient daughter informed nurse that patient presented with three staples behind left ear. Upon assessment patient presents with three intact staples behind left ear. Area is not inflamed and present bleeding. Looked in patient patient order history and no order to remove staples is present. Nurse relief notified and to inform provider in the morning. Sanda Linger, LPN

## 2021-02-04 NOTE — Progress Notes (Signed)
Occupational Therapy Session Note  Patient Details  Name: Ann Chaney MRN: [id number] Date of Birth: 28-Feb-1946  Today's Date: 02/04/2021 OT Individual Time: 0935-1030 OT Individual Time Calculation (min): 55 min   Short Term Goals: Week 2:  OT Short Term Goal 1 (Week 2): LTG =STG 2/2 ELOS  Skilled Therapeutic Interventions/Progress Updates:    Pt greeted seated in wc and agreeable to OT treatment session. Pt declined to shower, but wanted to sponge  bathe at the sink. Pt more conversational today during everyday activivties Overall supervision for balance an BADL tasks at the sink. When brushing teeth, pt did need cues still to spit out toothpaste after brushing. CGA ambulatory transfer onto commode with successful void of bladder. Supervision for peri-care. Functional ambulation in hallway without AD and CGA. Verbal cues not to reach for objects in hallway. UB there-ex 3 sets of 10 chest press and bicep curl using 3 lb dowel rod. Pt ambulated back to room in similar fashion and left semi-reclined in bed with bed alarm on, call bell in reach, and needs met.   Therapy Documentation Precautions:  Precautions Precautions: Fall Precaution Comments: R hemiparesis, aphasia Restrictions Weight Bearing Restrictions: No Pain:  Denies pain ADL: ADL Grooming: Minimal assistance, Moderate cueing Upper Body Bathing: Minimal assistance, Moderate cueing Lower Body Bathing: Minimal assistance, Moderate cueing Upper Body Dressing: Minimal assistance, Moderate cueing Lower Body Dressing: Moderate assistance, Moderate cueing Toileting: Minimal assistance Toilet Transfer: Minimal assistance Toilet Transfer Method: Stand pivot Tub/Shower Transfer: Unable to assess   Therapy/Group: Individual Therapy  Valma Cava 02/04/2021, 11:13 AM

## 2021-02-04 NOTE — Progress Notes (Signed)
Speech Language Pathology Daily Session Note  Patient Details  Name: Ann Chaney MRN: [id number] Date of Birth: 1945/05/25  Today's Date: 02/04/2021 SLP Individual Time: 1435-1500 SLP Individual Time Calculation (min): 25 min  Short Term Goals: Week 2: SLP Short Term Goal 1 (Week 2): STGs=LTGs due to ELOS  Skilled Therapeutic Interventions: Skilled treatment session focused on communication goals. SLP facilitated session by providing Mod A question cues and extra time for use of word-finding strategies during a complex verbal description task. Patient appeared more engaged this session but her decreased attention to tasks continue to be a barrier for word-finding at times. However, patient independently recalled events from previous therapy session with Mod I for word-finding at the sentence level. Patient left upright in wheelchair with daughter present and all needs within reach. Continue with current plan of care.      Pain No/Denies Pain   Therapy/Group: Individual Therapy  Derico Mitton 02/04/2021, 3:48 PM

## 2021-02-04 NOTE — Progress Notes (Signed)
Patient ID: Ann Chaney, female   DOB: 02-11-1946, 75 y.o.   MRN: [id number]  SW met with pt dtr [name] in room to provide [facility] list. SW to order junior RW.  Junior RW ordered with [employer] via parachute.   [name], MSW, [place] Office: [phone] Cell: [phone] Fax: [phone]

## 2021-02-04 NOTE — Progress Notes (Signed)
Physical Therapy Session Note  Patient Details  Name: BOSTYN KUNKLER MRN: [id number] Date of Birth: 1945/12/28  Today's Date: 02/04/2021 PT Individual Time: [phone] and 1335-1430 PT Individual Time Calculation (min): 58 min and 55 min  Short Term Goals: Week 2:  PT Short Term Goal 1 (Week 2): STG= LTG based on ELOS  Skilled Therapeutic Interventions/Progress Updates:     1st Session: Pt received supine in bed and agrees to therapy. Reports pain in L wrist. Number not provided. PT examines wrist and no edema or erythema noted. PT provides rest breaks as needed to manage pain. Pt performs bed mobility with bed features. Sit to stand with cues for initiation. Pt ambulates to closet to retrieve pants without cueing. PT cues pt to sit back on bed to don pants for safety. Pt performs without further assistance. Pt ambulates to gym with CGA and cues to increase gait speed and maintain forward and upward gaze to improve balance. Pt performs therex on mat to strengthen core and bilateral lower extremities as well as coordination training. Pt completes 3x10 bilateral lower extremity supine bridges, 1x10 single leg bridges with verbal and tactile cueing for body mechanics and optimal positioning. Pt performs 3x10 sidelying hip abduction with manual cues for maximal gluteus medius activation. Pt noted to have decreased difficulty with L lower extremity exercises versus R. Supine to sit with cues for positioning at edge of mat.  Pt trials RW to provide stability and NM feedback during ambulation. Pt ambulates x300' with RW and supervision, with cues for upright gaze to improve posture and balance, increasing proximity to RW to decrease risk for falls, and decreasing WB through RW for energy conservation. Pt verbalizes improved confidence and balance in standing with RW. PT provides pt with junior RW and pt ambulates back to room, x200', with same cueing.   Pt performs toilet with cues for RW management while  turning to sit on commode. Pericare independent. Pt left sitting in WC with alarm intact and all needs within reach  2nd Session: Pt received supine in bed and agrees to therapy. No complaint of pain. Daughter present and PT explains to daughter PT's recommendation for use of RW following DC. Pt performs bed mobility with bed features. Stand step transfer to [facility] with cues for positioning. WC transport to gym for time management.   Pt completes Nustep for strength and endurance training. Pt completes at workload of 5 with average steps per minute ~35 (PT challenged pt to maintain SPT at 40).   Following seated rest break, pt performs NMR for standing balance with task of tapping alternating feet on 3 inch step. PT provides verbal and tactile cues for posture and weight shifting to optimize performance and decrease risk for falls. Pt completes 2x20 with minA and multiple slight LOB backward due to posterior bias.  Pt progresses to performing step-ups onto 3 inch step with minA 1x10. Tactile cues at hips to increase glute medius engagement. WC transport back to room. Left seated with all needs within reach.   Therapy Documentation Precautions:  Precautions Precautions: Fall Precaution Comments: R hemiparesis, aphasia Restrictions Weight Bearing Restrictions: No   Therapy/Group: Individual Therapy  Breck Coons, PT, DPT 02/04/2021, 3:44 PM

## 2021-02-04 NOTE — Progress Notes (Signed)
PROGRESS NOTE   Subjective/Complaints: Pt on therapy mat. Says she slept well. No new complaints  ROS: Patient denies fever, rash, sore throat, blurred vision, nausea, vomiting, diarrhea, cough, shortness of breath or chest pain, joint or back pain, headache, or mood change.    Objective:   No results found. Recent Labs    02/04/21 0548  WBC 4.4  HGB 10.4*  HCT 32.5*  PLT 328    Recent Labs    02/04/21 0548  NA 137  K 5.0  CL 107  CO2 23  GLUCOSE 134*  BUN 13  CREATININE 0.82  CALCIUM 9.0     Intake/Output Summary (Last 24 hours) at 02/04/2021 0920 Last data filed at 02/04/2021 0740 Gross per 24 hour  Intake 700 ml  Output --  Net 700 ml        Physical Exam: Vital Signs Blood pressure (!) 143/68, pulse 72, temperature 98.1 F (36.7 C), resp. rate 14, height 5\' 1"  (1.549 m), weight 57.4 kg, SpO2 100 %.   Constitutional: No distress . Vital signs reviewed. HEENT: NCAT, EOMI, oral membranes moist Neck: supple Cardiovascular: RRR without murmur. No JVD    Respiratory/Chest: CTA Bilaterally without wheezes or rales. Normal effort    GI/Abdomen: BS +, non-tender, non-distended Ext: no clubbing, cyanosis, or edema Psych: pleasant and cooperative, engaging Neuro:  Pt remains oriented to person and month.more on point and engaging. Normal language and speech. Limited insight and awareness. 4/5 in BUE and BLE Musculoskeletal: normal PROM, no pain    Assessment/Plan: 1. Functional deficits which require 3+ hours per day of interdisciplinary therapy in a comprehensive inpatient rehab setting. Physiatrist is providing close team supervision and 24 hour management of active medical problems listed below. Physiatrist and rehab team continue to assess barriers to discharge/monitor patient progress toward functional and medical goals  Care Tool:  Bathing    Body parts bathed by patient: Right arm, Chest,  Left arm, Abdomen, Front perineal area, Buttocks, Right upper leg, Left upper leg, Right lower leg, Left lower leg, Face         Bathing assist Assist Level: Supervision/Verbal cueing     Upper Body Dressing/Undressing Upper body dressing   What is the patient wearing?: Pull over shirt    Upper body assist Assist Level: Set up assist    Lower Body Dressing/Undressing Lower body dressing      What is the patient wearing?: Pants, Incontinence brief     Lower body assist Assist for lower body dressing: Contact Guard/Touching assist     Toileting Toileting    Toileting assist Assist for toileting: Contact Guard/Touching assist     Transfers Chair/bed transfer  Transfers assist     Chair/bed transfer assist level: Contact Guard/Touching assist     Locomotion Ambulation   Ambulation assist      Assist level: Minimal Assistance - Patient > 75% Assistive device: Hand held assist Max distance: 18 ft   Walk 10 feet activity   Assist     Assist level: Minimal Assistance - Patient > 75% Assistive device: Hand held assist   Walk 50 feet activity   Assist Walk 50 feet with 2 turns  activity did not occur: Safety/medical concerns         Walk 150 feet activity   Assist Walk 150 feet activity did not occur: Safety/medical concerns         Walk 10 feet on uneven surface  activity   Assist Walk 10 feet on uneven surfaces activity did not occur: Safety/medical concerns         Wheelchair     Assist Is the patient using a wheelchair?: Yes Type of Wheelchair: Manual    Wheelchair assist level: Total Assistance - Patient < 25% Max wheelchair distance: 6 feet, unable to attend or motor plan with max cues    Wheelchair 50 feet with 2 turns activity    Assist        Assist Level: Total Assistance - Patient < 25%   Wheelchair 150 feet activity     Assist      Assist Level: Total Assistance - Patient < 25%   Blood pressure  (!) 143/68, pulse 72, temperature 98.1 F (36.7 C), resp. rate 14, height 5\' 1"  (1.549 m), weight 57.4 kg, SpO2 100 %.  Medical Problem List and Plan: 1.  Impaired mobility following large parasagittal meningioma resection             -patient may shower               -ELOS/Goals: 10-14 days Sup  -Continue CIR therapies including PT, OT, and SLP  2.  Antithrombotics: -DVT/anticoagulation:  Mechanical: Sequential compression devices, below knee Bilateral lower extremities             -antiplatelet therapy: N/a 3. Pain Management: Continue hydrocodone prn.  4. Mood/sleep: begin low dose trazodone for sleep 25mg   -ritalin for daytime wakefulness. Continue melatonin HS             -antipsychotic agents: N/A 5. Neuropsych: This patient is not fully capable of making decisions on her own behalf.  11/14 continue trial of ritalin at current dose of 5mg  bid for arousal/initiation. Seems to have provided some benefit 6. Skin/Wound Care: crani incision healing nicely 7. Fluids/Electrolytes/Nutrition: Monitor I/O.              --eating well 11/14 I personally reviewed the patient's labs today.    -potassium a little high--recheck before dc 8. HTN: Monitor BP TID- Continue Lisinopril and Toprol.              --11/14 BP/HR stable  9. T2DM: Hgb A1C-6.2 and well controlled on Metformin, Actos and Glipizide             --Monitor BS ac/hs and use SSI for elevated BS.            CBG (last 3)  Recent Labs    02/03/21 1708 02/03/21 2111 02/04/21 0621  GLUCAP 125* 152* 135*      11/14 fair control on metformin and actos -continue to hold glipizide 10. Goiter/Hyperthyroidism: Continue Methimazole.  11. Seizure pophylaxis: Has been seizure free  since surgery. Keppra stopped 12. Constipation: added Senna S at bedtime.  13. Hyponatremia: Na stable at 137.  14. Leucocytosis: resolved, d/t steroids   15. Acute blood loss anemia: hgb recovering back to up to 10.4 11/14--clinically no signs oif blood  loss 16. Open angle glaucoma:  Stable on Xalatan.        LOS: 12 days A FACE TO FACE EVALUATION WAS PERFORMED  Meredith Staggers 02/04/2021, 9:20 AM

## 2021-02-04 NOTE — Plan of Care (Signed)
  Problem: Consults Goal: RH BRAIN INJURY PATIENT EDUCATION Description: Description: See Patient Education module for eduction specifics Outcome: Progressing Goal: Skin Care Protocol Initiated - if Braden Score 18 or less Description: If consults are not indicated, leave blank or document N/A Outcome: Progressing Goal: Diabetes Guidelines if Diabetic/Glucose > 140 Description: If diabetic or lab glucose is > 140 mg/dl - Initiate Diabetes/Hyperglycemia Guidelines & Document Interventions  Outcome: Progressing   Problem: RH BOWEL ELIMINATION Goal: RH STG MANAGE BOWEL WITH ASSISTANCE Description: STG Manage Bowel with Min Assistance. Outcome: Progressing Goal: RH STG MANAGE BOWEL W/MEDICATION W/ASSISTANCE Description: STG Manage Bowel with Medication with [place]. Outcome: Progressing   Problem: RH BLADDER ELIMINATION Goal: RH STG MANAGE BLADDER WITH ASSISTANCE Description: STG Manage Bladder With Min Assistance Outcome: Progressing Goal: RH STG MANAGE BLADDER WITH MEDICATION WITH ASSISTANCE Description: STG Manage Bladder With Medication With [place]. Outcome: Progressing   Problem: RH SKIN INTEGRITY Goal: RH STG MAINTAIN SKIN INTEGRITY WITH ASSISTANCE Description: STG Maintain Skin Integrity With [place]. Outcome: Progressing Goal: RH STG ABLE TO PERFORM INCISION/WOUND CARE W/ASSISTANCE Description: STG Able To Perform Incision/Wound Care With [employer]. Outcome: Progressing   Problem: RH SAFETY Goal: RH STG ADHERE TO SAFETY PRECAUTIONS W/ASSISTANCE/DEVICE Description: STG Adhere to Safety Precautions With cues and reminders. Outcome: Progressing Goal: RH STG DECREASED RISK OF FALL WITH ASSISTANCE Description: STG Decreased Risk of Fall With [employer]. Outcome: Progressing   Problem: RH KNOWLEDGE DEFICIT BRAIN INJURY Goal: RH STG INCREASE KNOWLEDGE OF SELF CARE AFTER BRAIN INJURY Description: Patient will demonstrate knowledge of medication  management, diabetes management, skin/wound care with educational materials and handouts provided by staff independently at discharge. Outcome: Progressing

## 2021-02-05 LAB — GLUCOSE, CAPILLARY
Glucose-Capillary: 139 mg/dL — ABNORMAL HIGH (ref 70–99)
Glucose-Capillary: 94 mg/dL (ref 70–99)
Glucose-Capillary: 96 mg/dL (ref 70–99)

## 2021-02-05 MED ORDER — POLYETHYLENE GLYCOL 3350 17 G PO PACK: 17.0000 g | PACK | Freq: Every day | ORAL | 0 refills | Status: DC | PRN

## 2021-02-05 MED ORDER — HYDROCODONE-ACETAMINOPHEN 5-325 MG PO TABS: 1.0000 | ORAL_TABLET | Freq: Two times a day (BID) | ORAL | Status: DC | PRN

## 2021-02-05 NOTE — Progress Notes (Signed)
Speech Language Pathology Daily Session Note  Patient Details  Name: Ann Chaney MRN: [id number] Date of Birth: 1945/11/23  Today's Date: 02/05/2021 SLP Individual Time: 1300-1330 SLP Individual Time Calculation (min): 30 min  Short Term Goals: Week 2: SLP Short Term Goal 1 (Week 2): STGs=LTGs due to ELOS  Skilled Therapeutic Interventions: Pt received upright in wheelchair and agreeable to skilled ST intervention with focus on cognitive goals. Daughter at bedside. SLP facilitated sustained attention, problem solving, reasoning, and functional recall with novel card game Uno. Pt completed task with moderate fading to min A verbal and visual cues for recall of instructions, visual attention, and additional processing time to optimize accuracy. SLP educated daughter on various cues and modifications to task if interested in completing at home. Patient was left in chair with alarm activated and immediate needs within reach at end of session. Continue per current plan of care.      Pain Pain Assessment Pain Scale: 0-10 Pain Score: 0-No pain  Therapy/Group: Individual Therapy  Patty Sermons 02/05/2021, 1:29 PM

## 2021-02-05 NOTE — Progress Notes (Addendum)
Occupational Therapy Discharge Summary  Patient Details  Name: Ann Chaney MRN: [id number] Date of Birth: 01-17-1946   Patient has met 12 of 12 long term goals due to improved activity tolerance, improved balance, postural control, ability to compensate for deficits, functional use of  RIGHT upper and RIGHT lower extremity, improved attention, improved awareness, and improved coordination.  Patient to discharge at overall Supervision level.  Patient's care partner is independent to provide the necessary physical assistance at discharge.    Reasons goals not met:  All goals met.   Recommendation:  Patient will benefit from ongoing skilled OT services in home health setting to continue to advance functional skills in the area of BADL.  Equipment: 3-in-1 BSC  Reasons for discharge: treatment goals met and discharge from hospital  Patient/family agrees with progress made and goals achieved: Yes  OT Discharge Precautions/Restrictions  Precautions Precautions: Fall Precaution Comments: mild R hemiplegia Restrictions Weight Bearing Restrictions: No   ADL ADL Eating: Independent Grooming: Supervision/safety Upper Body Bathing: Supervision/safety Lower Body Bathing: Supervision/safety Upper Body Dressing: Supervision/safety Lower Body Dressing: Supervision/safety Toileting: Supervision/safety Toilet Transfer: Distant supervision Toilet Transfer Method: Stand pivot Tub/Shower Transfer: Close supervison Perception  Perception: Within Functional Limits Praxis Praxis: Impaired Praxis Impairment Details: Motor planning Cognition Overall Cognitive Status: Impaired/Different from baseline Arousal/Alertness: Awake/alert Orientation Level: Oriented X4 Year: 2022 Month: November Day of Week: Correct Memory: Impaired Immediate Memory Recall: Blue;Sock;Bed Memory Recall Sock: Not able to recall Memory Recall Blue: Without Cue Memory Recall Bed: Not able to recall Awareness:  Impaired Self Monitoring: Impaired Safety/Judgment: Impaired Sensation Sensation Light Touch: Impaired Detail Light Touch Impaired Details: Impaired RUE Coordination Gross Motor Movements are Fluid and Coordinated: No Fine Motor Movements are Fluid and Coordinated: No Coordination and Movement Description: Mild R hemi, improved smoothness and accuracy using R hand Motor  Motor Motor: Hemiplegia Motor - Skilled Clinical Observations: Mild R hemi, much improved sine eval Mobility  Bed Mobility Supine to Sit: Independent Sit to Supine: Independent Transfers Sit to Stand: Supervision/Verbal cueing Stand to Sit: Supervision/Verbal cueing  Balance Static Sitting Balance Static Sitting - Balance Support: Feet supported Static Sitting - Level of Assistance: 7: Independent Dynamic Sitting Balance Dynamic Sitting - Balance Support: Feet supported Dynamic Sitting - Level of Assistance: 7: Independent Static Standing Balance Static Standing - Balance Support: During functional activity Static Standing - Level of Assistance: 5: Stand by assistance Dynamic Standing Balance Dynamic Standing - Balance Support: During functional activity Dynamic Standing - Level of Assistance: 5: Stand by assistance Extremity/Trunk Assessment RUE Assessment RUE Assessment: Exceptions to [facility] General Strength Comments: 4/5 overall, mild coordination deficist remain LUE Assessment LUE Assessment: Within Functional Limits   Daneen Schick Aboubacar Matsuo 02/05/2021, 9:29 AM

## 2021-02-05 NOTE — Progress Notes (Addendum)
Patient ID: Ann Chaney, female   DOB: January 19, 1946, 75 y.o.   MRN: [id number]  SW ordered 3in1 BSC with [employer] via parachute.   SW met with pt and pt dtr [name] in room to provide updates from team conference and d/c date remains 11/17. SW informed on d/c recs: HHPT/OT/SLP and DME- junior RW and 3in1 BSC. SW informed on DME ordered. Preferred HHA is [facility]. SW explained if this agency is unable to accept, will find an agency that can.   SW spoke with [facility] with referrals [phone]) to discuss potential referral for HHPT/OT/SLP. Reports waiting on an answer from the branch and will follow-up once there is an answer.  *Message from [facility] reporting referral declined due to staffing.   SW sent referral to [place] and waiting on follow-up.  [name], MSW, [place] Office: [phone] Cell: [phone] Fax: [phone]

## 2021-02-05 NOTE — Progress Notes (Signed)
Occupational Therapy Session Note  Patient Details  Name: ADRINNE SZE MRN: [id number] Date of Birth: February 22, 1946  Today's Date: 02/05/2021 OT Individual Time: 1100-1200 OT Individual Time Calculation (min): 60 min    Short Term Goals: Week 2:  OT Short Term Goal 1 (Week 2): LTG =STG 2/2 ELOS  Skilled Therapeutic Interventions/Progress Updates:  Patient met seated in wc in agreement with OT treatment session. 0/10 pain reported at rest and with activity. Treatment session with focus on higher level cognition including attention and memory. Patient able to plan out dinner meals x1 week and fill in subsequent shopping list with Min A. Required cues for remembering which grocery items to place on list based on meu. Functional mobility to and from ortho gym without AD (focus on dynamic standing balance especially with turns) and supervision A. Trail making task with use of BITs. First trial with numbers only with patient completing task in 2:54 sec. 1 error noted. Second trial with alternating numbers and letters with patient requiring max cues. 7:04 sec required to complete task and 33 errors noted. Patient then completed memory task with use of BITs completing 8 trials successfully with accuracy of 61.54%. Session concluded with patient seated in wc with daughter present at bedside with call bell within reach, belt alarm activated and all needs met.   Therapy Documentation Precautions:  Precautions Precautions: Fall Precaution Comments: mild R hemiplegia Restrictions Weight Bearing Restrictions: No General:   Therapy/Group: Individual Therapy  Lataja Newland R [place] 02/05/2021, 10:20 AM

## 2021-02-05 NOTE — Progress Notes (Signed)
Occupational Therapy Session Note  Patient Details  Name: Ann Chaney MRN: [id number] Date of Birth: 07-18-1945  Today's Date: 02/05/2021 OT Individual Time: 4536-4680 OT Individual Time Calculation (min): 56 min    Short Term Goals: Week 2:  OT Short Term Goal 1 (Week 2): LTG =STG 2/2 ELOS  Skilled Therapeutic Interventions/Progress Updates:    Pt greeted semi-reclined in bed. Pt  had been incontinent of urine, soaked through clothing and bed. Pt reported need to go to the bathroom again. Pt ambulated to the bathroom with close supervision and RW. Pt voided more on commode and doffed soiled lower body clothing seated on toilet. Pt then ambulated into shower but forgot to take off her shirt before turning on water. Pt laughed, but pt still needed cues to remove shirt.  Bathing tasks sit<>stand from tub bench in shower with supervision. Pt able to use figure 4 position to wash lower legs and feet. Supervision for ambulating back out of shower to wc. Dressing sit<>stand from wc with supervision. Grooming tasks standing at the sink with verbal cues to spit out toothpaste after brushing. OT reviewed dc plan and OT goals of supervision with pt, pt agreeable. Pt left seated in wc with alarm belt on, call bell in reach, and needs met.    Therapy Documentation Precautions:  Precautions Precautions: Fall Precaution Comments: mild R hemiplegia Restrictions Weight Bearing Restrictions: No Pain:  Denies pain   Therapy/Group: Individual Therapy  Valma Cava 02/05/2021, 9:29 AM

## 2021-02-05 NOTE — Progress Notes (Signed)
Physical Therapy Session Note  Patient Details  Name: Ann Chaney MRN: [id number] Date of Birth: 23-May-1945  Today's Date: 02/05/2021 PT Individual Time: 9629-5284 PT Individual Time Calculation (min): 53 min   Short Term Goals: Week 2:  PT Short Term Goal 1 (Week 2): STG= LTG based on ELOS  Skilled Therapeutic Interventions/Progress Updates:     Pt received ambulating out of restroom with daughter providing CGA. Agreeable to therapy and no report of pain. WC transport to gym for time management. Pt performs ambulation at self selected speed, initially. Pt ambulates 100' in 1:24 with RW and close supervision, with cues for upright gaze to improve posture and balance, and increasing proximity to RW for safety. PT then educates pt on importance of increasing gait speed to improve balance and challenges pt to ambulate same distance at faster pace. Pt then ambulates 100' in 49 seconds. Following seated rest break, pt ambulates 100' 47 seconds. Cues for upright gaze as pt continues to have tendency to look down at ground at baseline.  Pt ambulates x30' with RW, then coimpletes x12 6" steps with bilateral hand rails, utilizing reciprocal gait pattern when ascending and step-to gait pattern while descending.  Pt performs Furniture conservator/restorer, scoring a 43/56, demonstrating an MCID improvement from previous 35/56.  Pt ambulates back to room, x200' with RW and cues to increase proximity to RW for safety. Pt returns to supine independently. Left with alarm intact and all needs within reach.  Therapy Documentation Precautions:  Precautions Precautions: Fall Precaution Comments: mild R hemiplegia Restrictions Weight Bearing Restrictions: No   Therapy/Group: Individual Therapy  Breck Coons, PT, DPT 02/05/2021, 3:25 PM

## 2021-02-05 NOTE — Discharge Instructions (Addendum)
Inpatient Rehab Discharge Instructions  Ann Chaney Discharge date and time:  02/07/21  Activities/Precautions/ Functional Status: Activity: no lifting, driving, or strenuous exercise till cleared by Dr. Annette Stable Diet: regular diet Wound Care: keep wound clean and dry. Contact Dr. Annette Stable  if you develop any problems with your incision/wound--redness, swelling, increase in pain, drainage or if you develop fever or chills.     Functional status:  ___ No restrictions     ___ Walk up steps independently _X__ 24/7 supervision/assistance   ___ Walk up steps with assistance ___ Intermittent supervision/assistance  ___ Bathe/dress independently ___ Walk with walker     ___ Bathe/dress with assistance ___ Walk Independently    ___ Shower independently ___ Walk with assistance    _X__ Shower with assistance _X__ No alcohol     ___ Return to work/school ________   COMMUNITY REFERRALS UPON DISCHARGE:    Home Health:   PT      OT      ST                     Agency:  Phone:     Medical Equipment/Items Ordered: junior rolling walker, and 3in1 bedside commode                                                 Agency/Supplier: [employer] [phone]   Special Instructions: Your blood sugars are doing great on current regimen but your diet is controlled here. Check your blood sugars before meals and at bedtime--take results with you to your PCP for evaluation. If your blood sugars start running over 150 contact PCP for input on medication changes.    My questions have been answered and I understand these instructions. I will adhere to these goals and the provided educational materials after my discharge from the hospital.  Patient/Caregiver Signature _______________________________ Date __________  Clinician Signature _______________________________________ Date __________  Please bring this form and your medication list with you to all your follow-up doctor's appointments.

## 2021-02-05 NOTE — Patient Care Conference (Signed)
Inpatient RehabilitationTeam Conference and Plan of Care Update Date: 02/05/2021   Time: 10:36 AM    Patient Name: Ann Chaney      Medical Record Number: [id number]  Date of Birth: [birth year] Sex: Female         Room/Bed: 4M11C/4M11C-01 Payor Info: Payor: [employer] MEDICARE / Plan: [facility] MEDICARE / Product Type: *No Product type* /    Admit Date/Time:  01/23/2021  1:34 PM  Primary Diagnosis:  S/P resection of meningioma  Hospital Problems: Principal Problem:   S/P resection of meningioma    Expected Discharge Date: Expected Discharge Date: 02/07/21  Team Members Present: Physician leading conference: Dr. Alger Simons Social Worker Present: [name], [place] Nurse Present: Dorthula Nettles, RN PT Present: Tereasa Coop, PT OT Present: Cherylynn Ridges, OT SLP Present: Weston Anna, SLP PPS Coordinator present : Gunnar Fusi, SLP     Current Status/Progress Goal Weekly Team Focus  Bowel/Bladder   continent b/b, lbm 01/31/21  Remain continent  medication for bowel movement   Swallow/Nutrition/ Hydration             ADL's   CGA/supervision  Supervision  self-care retraining, activity tolerance, cognitive retraining, pt/family education   Mobility   mod(I) bed mobility, supervision to CGA for transfers, supervision gait with RW x300'  Supervision/CGA  balance, ambulation, stairs, DC prep   Communication   Min-Mod A  Min-Mod A  Family Education   Safety/Cognition/ Behavioral Observations  Min-Mod A  Min A  Family Education   Pain   4/10 pain to BLE  <3 out of 10  assess pain q 4hr and prn   Skin   incision to head OTA  no s/s of infection  assess q shift and prn     Discharge Planning:  D/c to home and her dtr will be moving in with her. No concerns about supports. Fam edu completed on 11/11 9am-12pm with pt dtr [name].   Team Discussion: Medically doing well. Staples remaining in back of head. Continent B/B with episodes of incontinence. LBM 01/31/21.  Supervision overall. Patient has had incontinent episodes before therapy. Has sequencing awareness. Family education completed yesterday. Will need Jr. sized RW. Cognition fluctuates throughout the day. Patient on target to meet rehab goals: yes  *See Care Plan and progress notes for long and short-term goals.   Revisions to Treatment Plan:  Adding bowel medications to induce BM  Teaching Needs: Family education, medication/pain management, skin/wound care, transfer training, etc.  Current Barriers to Discharge: Home enviroment access/layout and Medication compliance  Possible Resolutions to Barriers: Family education Family has no concerns about support.     Medical Summary Current Status: Improved arousal and engagement. still lacks awareness. pain control better. constipated. cbg's and bp better controlled  Barriers to Discharge: Medical stability   Possible Resolutions to [employer] Focus: daily assessment of labs/medication regimen.   Continued Need for Acute Rehabilitation Level of Care: The patient requires daily medical management by a physician with specialized training in physical medicine and rehabilitation for the following reasons: Direction of a multidisciplinary physical rehabilitation program to maximize functional independence : Yes Medical management of patient stability for increased activity during participation in an intensive rehabilitation regime.: Yes Analysis of laboratory values and/or radiology reports with any subsequent need for medication adjustment and/or medical intervention. : Yes   I attest that I was present, lead the team conference, and concur with the assessment and plan of the team.   Cristi Loron 02/05/2021, 1:31 PM

## 2021-02-05 NOTE — Progress Notes (Signed)
PROGRESS NOTE   Subjective/Complaints: Pt slept well. No complaints today. Laughing a lot in response to questions!  ROS: Patient denies fever, rash, sore throat, blurred vision, nausea, vomiting, diarrhea, cough, shortness of breath or chest pain, joint or back pain, headache, or mood change.    Objective:   No results found. Recent Labs    02/04/21 0548  WBC 4.4  HGB 10.4*  HCT 32.5*  PLT 328    Recent Labs    02/04/21 0548  NA 137  K 5.0  CL 107  CO2 23  GLUCOSE 134*  BUN 13  CREATININE 0.82  CALCIUM 9.0     Intake/Output Summary (Last 24 hours) at 02/05/2021 0906 Last data filed at 02/05/2021 0810 Gross per 24 hour  Intake 840 ml  Output --  Net 840 ml        Physical Exam: Vital Signs Blood pressure (!) 139/56, pulse 79, temperature 99.3 F (37.4 C), resp. rate 17, height 5\' 1"  (1.549 m), weight 57.4 kg, SpO2 100 %.   Constitutional: No distress . Vital signs reviewed. HEENT: NCAT, EOMI, oral membranes moist Neck: supple Cardiovascular: RRR without murmur. No JVD    Respiratory/Chest: CTA Bilaterally without wheezes or rales. Normal effort    GI/Abdomen: BS +, non-tender, non-distended Ext: no clubbing, cyanosis, or edema Psych: pleasant and cooperative  Neuro:  Pt remains oriented to person and month.more on point and engaging. Normal language and speech. Limited insight and awareness. 4/5 in BUE and BLE Musculoskeletal: normal PROM, no pain    Assessment/Plan: 1. Functional deficits which require 3+ hours per day of interdisciplinary therapy in a comprehensive inpatient rehab setting. Physiatrist is providing close team supervision and 24 hour management of active medical problems listed below. Physiatrist and rehab team continue to assess barriers to discharge/monitor patient progress toward functional and medical goals  Care Tool:  Bathing    Body parts bathed by patient: Right  arm, Chest, Left arm, Abdomen, Front perineal area, Buttocks, Right upper leg, Left upper leg, Right lower leg, Left lower leg, Face         Bathing assist Assist Level: Supervision/Verbal cueing     Upper Body Dressing/Undressing Upper body dressing   What is the patient wearing?: Pull over shirt    Upper body assist Assist Level: Set up assist    Lower Body Dressing/Undressing Lower body dressing      What is the patient wearing?: Pants, Incontinence brief     Lower body assist Assist for lower body dressing: Contact Guard/Touching assist     Toileting Toileting    Toileting assist Assist for toileting: Contact Guard/Touching assist     Transfers Chair/bed transfer  Transfers assist     Chair/bed transfer assist level: Contact Guard/Touching assist     Locomotion Ambulation   Ambulation assist      Assist level: Minimal Assistance - Patient > 75% Assistive device: Hand held assist Max distance: 18 ft   Walk 10 feet activity   Assist     Assist level: Minimal Assistance - Patient > 75% Assistive device: Hand held assist   Walk 50 feet activity   Assist Walk 50 feet with  2 turns activity did not occur: Safety/medical concerns         Walk 150 feet activity   Assist Walk 150 feet activity did not occur: Safety/medical concerns         Walk 10 feet on uneven surface  activity   Assist Walk 10 feet on uneven surfaces activity did not occur: Safety/medical concerns         Wheelchair     Assist Is the patient using a wheelchair?: Yes Type of Wheelchair: Manual    Wheelchair assist level: Total Assistance - Patient < 25% Max wheelchair distance: 6 feet, unable to attend or motor plan with max cues    Wheelchair 50 feet with 2 turns activity    Assist        Assist Level: Total Assistance - Patient < 25%   Wheelchair 150 feet activity     Assist      Assist Level: Total Assistance - Patient < 25%    Blood pressure (!) 139/56, pulse 79, temperature 99.3 F (37.4 C), resp. rate 17, height 5\' 1"  (1.549 m), weight 57.4 kg, SpO2 100 %.  Medical Problem List and Plan: 1.  Impaired mobility following large parasagittal meningioma resection             -patient may shower               -ELOS/Goals: 10-14 days Sup  -Continue CIR therapies including PT, OT, and SLP. Interdisciplinary team conference today to discuss goals, barriers to discharge, and dc planning.   2.  Antithrombotics: -DVT/anticoagulation:  Mechanical: Sequential compression devices, below knee Bilateral lower extremities             -antiplatelet therapy: N/a 3. Pain Management: Continue hydrocodone prn.  4. Mood/sleep: begin low dose trazodone for sleep 25mg   -ritalin for daytime wakefulness. Continue melatonin HS             -antipsychotic agents: N/A 5. Neuropsych: This patient is not fully capable of making decisions on her own behalf.  11/15 continue trial of ritalin at current dose of 5mg  bid for arousal/initiation. Seems to have provided some benefit 6. Skin/Wound Care: crani incision healing nicely 7. Fluids/Electrolytes/Nutrition: Monitor I/O.              --eating well -recent potassium a little high--recheck before dc 8. HTN: Monitor BP TID- Continue Lisinopril and Toprol.              --11/14 BP/HR stable  9. T2DM: Hgb A1C-6.2 and well controlled on Metformin, Actos and Glipizide             --Monitor BS ac/hs and use SSI for elevated BS.            CBG (last 3)  Recent Labs    02/04/21 1644 02/04/21 2102 02/05/21 0548  GLUCAP 129* 154* 139*      11/15 reasonable control on metformin and actos -continue to hold glipizide 10. Goiter/Hyperthyroidism: Continue Methimazole.  11. Seizure pophylaxis: Has been seizure free  since surgery. Keppra stopped 12. Constipation: continue Senna S at bedtime.   11/15 no bm since 11/10   -miralax and supp today 13. Hyponatremia: Na stable at 137.  14. Leucocytosis:  resolved, d/t steroids   15. Acute blood loss anemia: hgb recovering back to up to 10.4 11/14--clinically no signs oif blood loss 16. Open angle glaucoma:  Stable on Xalatan.        LOS: 13 days A FACE TO  [place] EVALUATION WAS PERFORMED  Meredith Staggers 02/05/2021, 9:06 AM

## 2021-02-06 LAB — BASIC METABOLIC PANEL
Anion gap: 9 (ref 5–15)
BUN: 13 mg/dL (ref 8–23)
CO2: 26 mmol/L (ref 22–32)
Calcium: 9.3 mg/dL (ref 8.9–10.3)
Chloride: 103 mmol/L (ref 98–111)
Creatinine, Ser: 0.84 mg/dL (ref 0.44–1.00)
GFR, Estimated: >60 mL/min (ref >60)
Glucose, Bld: 119 mg/dL — ABNORMAL HIGH (ref 70–99)
Potassium: 4 mmol/L (ref 3.5–5.1)
Sodium: 138 mmol/L (ref 135–145)

## 2021-02-06 LAB — GLUCOSE, CAPILLARY
Glucose-Capillary: 189 mg/dL — ABNORMAL HIGH (ref 70–99)
Glucose-Capillary: 113 mg/dL — ABNORMAL HIGH (ref 70–99)
Glucose-Capillary: 135 mg/dL — ABNORMAL HIGH (ref 70–99)
Glucose-Capillary: 146 mg/dL — ABNORMAL HIGH (ref 70–99)
Glucose-Capillary: 186 mg/dL — ABNORMAL HIGH (ref 70–99)

## 2021-02-06 MED ORDER — SORBITOL 70 % SOLN
60.0000 mL | Status: AC
  Administered 2021-02-06: 60 mL via ORAL
  Filled 2021-02-06: qty 60

## 2021-02-06 NOTE — Progress Notes (Signed)
Physical Therapy Discharge Summary  Patient Details  Name: Ann Chaney MRN: [id number] Date of Birth: 30-Mar-1945  Today's Date: 02/06/2021 PT Individual Time: 1020-1115 PT Individual Time Calculation (min): 55 min    Patient has met 10 of 10 long term goals due to improved activity tolerance, improved balance, improved postural control, increased strength, improved attention, and improved awareness.  Patient to discharge at an ambulatory level Supervision.   Patient's care partner is independent to provide the necessary physical and cognitive assistance at discharge.  Reasons goals not met: NA  Recommendation:  Patient will benefit from ongoing skilled PT services in home health setting to continue to advance safe functional mobility, address ongoing impairments in balance, ambulation, safety awareness, and minimize fall risk.  Equipment: Junior RW  Reasons for discharge: treatment goals met and discharge from hospital  Patient/family agrees with progress made and goals achieved: Yes  Skilled Therapeutic Interventions: Pt received seated in [facility] and agrees to therapy. No complaint of pain. WC transport to gym for time management. Pt performs multiple reps of sit to stand with verbal cues for hand placement and sequencing. Pt ambulates x300' with RW and cues to maintain upright gaze to improve posture and balance, and increasing proximity to RW for safety. Pt completes ramp navigation and car transfers with cues for safe performance and RW management. Following seated rest break, pt ambulates 250' with same cues. Seated rest break prior to attempting gait on treadmill for velocity controlled ambulation. Pt steps up onto treadmill with HHA CGA. Pt ambulates x3:00 at 0.5-1.0 mph for a total of 200' Pt has difficulty maintaining safe gait pattern at 1.0 mph so brought back down to 0.8 mph for majority of bout. Following extended seated rest break, pt ambulates for 3:00 at 0.8 mph for 210'  Mirror provided for visual feedback and pt cued throughout to maintain upright posture. Pt uses bilateral upper extremities for support and several instances takes R hand off to adjust mask. When pt takes hand off handle she has noted decrease in balance and deterioration of gait pattern, requiring modA to prevent fall. Otherwise pt performs with CGA/minA at hips for stability and cues for increased extension. Pt steps back off treadmill with HHA and minA. WC transport back to room. Left seated with alarm intact and all needs within reach.  PT Discharge Precautions/Restrictions Precautions Precautions: Fall Precaution Comments: mild R hemiplegia Restrictions Weight Bearing Restrictions: No Pain Interference Pain Interference Pain Effect on Sleep: 1. Rarely or not at all Pain Interference with Therapy Activities: 1. Rarely or not at all Pain Interference with Day-to-Day Activities: 1. Rarely or not at all Vision/Perception  Vision - History Ability to See in Adequate Light: 1 Impaired Perception Perception: Within Functional Limits Praxis Praxis: Impaired Praxis Impairment Details: Motor planning  Cognition Overall Cognitive Status: Impaired/Different from baseline Arousal/Alertness: Awake/alert Orientation Level: Oriented X4 Year: 2022 Month: November Day of Week: Correct Safety/Judgment: Impaired Sensation Sensation Light Touch: Impaired Detail Light Touch Impaired Details: Impaired RUE Coordination Gross Motor Movements are Fluid and Coordinated: No Fine Motor Movements are Fluid and Coordinated: No Coordination and Movement Description: Mild R hemi, improved smoothness and accuracy using R hand Motor  Motor Motor: Hemiplegia Motor - Skilled Clinical Observations: Mild R hemi, much improved sine eval  Mobility Bed Mobility Bed Mobility: Supine to Sit;Sit to Supine Supine to Sit: Independent Sit to Supine: Independent Transfers Transfers: Sit to Stand;Stand to  Sit Sit to Stand: Supervision/Verbal cueing Stand to Sit: Supervision/Verbal cueing Stand  Pivot Transfers: Supervision/Verbal cueing Stand Pivot Transfer Details: Verbal cues for technique Locomotion  Gait Ambulation: Yes Gait Assistance: Supervision/Verbal cueing Gait Distance (Feet): 300 Feet Assistive device: Rolling walker Gait Assistance Details: Verbal cues for safe use of DME/AE;Verbal cues for technique Gait Gait: Yes Gait Pattern: Impaired Gait Pattern: Decreased stride length Gait velocity: decreased Stairs / Additional Locomotion Stairs: Yes Stairs Assistance: Supervision/Verbal cueing Number of Stairs: 12 Height of Stairs: 6 Ramp: Supervision/Verbal cueing Curb: Supervision/Verbal cueing Wheelchair Mobility Wheelchair Mobility: No  Trunk/Postural Assessment  Cervical Assessment Cervical Assessment: Within Functional Limits Thoracic Assessment Thoracic Assessment: Within Functional Limits Lumbar Assessment Lumbar Assessment: Within Functional Limits Postural Control Postural Control: Deficits on evaluation (Decreased and delayed, but improved from eval)  Balance Balance Balance Assessed: Yes Static Sitting Balance Static Sitting - Balance Support: Feet supported Static Sitting - Level of Assistance: 7: Independent Dynamic Sitting Balance Dynamic Sitting - Balance Support: Feet supported Dynamic Sitting - Level of Assistance: 7: Independent Static Standing Balance Static Standing - Balance Support: During functional activity Static Standing - Level of Assistance: 5: Stand by assistance Dynamic Standing Balance Dynamic Standing - Balance Support: During functional activity Dynamic Standing - Level of Assistance: 5: Stand by assistance Extremity Assessment  RLE Assessment RLE Assessment: Exceptions to WFL RLE Strength RLE Overall Strength: Deficits Right Hip Flexion: 4+/5 Right Knee Flexion: 3+/5 Right Knee Extension: 5/5 Right Ankle Dorsiflexion:  5/5 Right Ankle Plantar Flexion: 5/5 LLE Assessment LLE Assessment: Within Functional Limits General Strength Comments: Grossly 5/5 throughout in sitting     C , PT, DPT 02/06/2021, 10:54 AM 

## 2021-02-06 NOTE — Progress Notes (Signed)
Patient ID: Ann Chaney, female   DOB: 1945-03-26, 75 y.o.   MRN: [id number]  SW received updates from [facility] who reported referral declined.   SW sent HHPT/OT/SLP referral to [name] and waiting on follow-up.    [name], MSW, [place] Office: [phone] Cell: [phone] Fax: [phone]

## 2021-02-06 NOTE — Progress Notes (Addendum)
PROGRESS NOTE   Subjective/Complaints: Pt up in bed. No complaints. Staples out without any issues  ROS: Limited due to cognitive/behavioral    Objective:   No results found. Recent Labs    02/04/21 0548  WBC 4.4  HGB 10.4*  HCT 32.5*  PLT 328    Recent Labs    02/04/21 0548 02/06/21 0514  NA 137 138  K 5.0 4.0  CL 107 103  CO2 23 26  GLUCOSE 134* 119*  BUN 13 13  CREATININE 0.82 0.84  CALCIUM 9.0 9.3     Intake/Output Summary (Last 24 hours) at 02/06/2021 0815 Last data filed at 02/05/2021 1830 Gross per 24 hour  Intake 480 ml  Output --  Net 480 ml        Physical Exam: Vital Signs Blood pressure 131/69, pulse 76, temperature 98.8 F (37.1 C), temperature source Oral, resp. rate 18, height 5\' 1"  (1.549 m), weight 57.4 kg, SpO2 98 %.   Constitutional: No distress . Vital signs reviewed. HEENT: NCAT, EOMI, oral membranes moist Neck: supple Cardiovascular: RRR without murmur. No JVD    Respiratory/Chest: CTA Bilaterally without wheezes or rales. Normal effort    GI/Abdomen: BS +, non-tender, non-distended Ext: no clubbing, cyanosis, or edema Psych: pleasant and cooperative  Skin: staples out of scalp Neuro:  Pt remains oriented to person and month.more on point and engaging. Normal language and speech. Limited insight and awareness but stable to improved.. 4/5 in BUE and BLE Musculoskeletal: normal PROM, no pain    Assessment/Plan: 1. Functional deficits which require 3+ hours per day of interdisciplinary therapy in a comprehensive inpatient rehab setting. Physiatrist is providing close team supervision and 24 hour management of active medical problems listed below. Physiatrist and rehab team continue to assess barriers to discharge/monitor patient progress toward functional and medical goals  Care Tool:  Bathing    Body parts bathed by patient: Right arm, Chest, Left arm, Abdomen, Front  perineal area, Buttocks, Right upper leg, Left upper leg, Right lower leg, Left lower leg, Face         Bathing assist Assist Level: Supervision/Verbal cueing     Upper Body Dressing/Undressing Upper body dressing   What is the patient wearing?: Pull over shirt    Upper body assist Assist Level: Set up assist    Lower Body Dressing/Undressing Lower body dressing      What is the patient wearing?: Incontinence brief, Pants     Lower body assist Assist for lower body dressing: Supervision/Verbal cueing     Toileting Toileting    Toileting assist Assist for toileting: Supervision/Verbal cueing     Transfers Chair/bed transfer  Transfers assist     Chair/bed transfer assist level: Supervision/Verbal cueing     Locomotion Ambulation   Ambulation assist      Assist level: Minimal Assistance - Patient > 75% Assistive device: Hand held assist Max distance: 18 ft   Walk 10 feet activity   Assist     Assist level: Minimal Assistance - Patient > 75% Assistive device: Hand held assist   Walk 50 feet activity   Assist Walk 50 feet with 2 turns activity did not occur:  Safety/medical concerns         Walk 150 feet activity   Assist Walk 150 feet activity did not occur: Safety/medical concerns         Walk 10 feet on uneven surface  activity   Assist Walk 10 feet on uneven surfaces activity did not occur: Safety/medical concerns         Wheelchair     Assist Is the patient using a wheelchair?: Yes Type of Wheelchair: Manual    Wheelchair assist level: Total Assistance - Patient < 25% Max wheelchair distance: 6 feet, unable to attend or motor plan with max cues    Wheelchair 50 feet with 2 turns activity    Assist        Assist Level: Total Assistance - Patient < 25%   Wheelchair 150 feet activity     Assist      Assist Level: Total Assistance - Patient < 25%   Blood pressure 131/69, pulse 76, temperature 98.8  F (37.1 C), temperature source Oral, resp. rate 18, height 5\' 1"  (1.549 m), weight 57.4 kg, SpO2 98 %.  Medical Problem List and Plan: 1.  Impaired mobility following large parasagittal meningioma resection             -patient may shower               -ELOS/Goals: 11/17 Sup  -Continue CIR therapies including PT, OT, and SLP   2.  Antithrombotics: -DVT/anticoagulation:  Mechanical: Sequential compression devices, below knee Bilateral lower extremities             -antiplatelet therapy: N/a 3. Pain Management: Continue hydrocodone prn.  4. Mood/sleep: begin low dose trazodone for sleep 25mg   -ritalin for daytime wakefulness. Continue melatonin HS             -antipsychotic agents: N/A 5. Neuropsych: This patient is not fully capable of making decisions on her own behalf.  11/15 continue trial of ritalin at current dose of 5mg  bid for arousal/initiation. Seems to have provided some benefit 6. Skin/Wound Care: crani incision healing nicely 7. Fluids/Electrolytes/Nutrition: Monitor I/O.              --eating well -11/16 today's potassium improved to 4.0 8. HTN: Monitor BP TID- Continue Lisinopril and Toprol.              --11/14 BP/HR stable  9. T2DM: Hgb A1C-6.2 and well controlled on Metformin, Actos and Glipizide             --Monitor BS ac/hs and use SSI for elevated BS.            CBG (last 3)  Recent Labs    02/05/21 1204 02/05/21 1732 02/06/21 0546  GLUCAP 94 96 113*      11/16 reasonable control on metformin and actos -continue to hold glipizide 10. Goiter/Hyperthyroidism: Continue Methimazole.  11. Seizure pophylaxis: Has been seizure free  since surgery. Keppra stopped 12. Constipation: continue Senna S at bedtime.   11/16 no bm since 11/10   -miralax and supp without result   -sorbitol today and enema 13. Hyponatremia: Na stable at 137.  14. Leucocytosis: resolved, d/t steroids   15. Acute blood loss anemia: hgb recovering back to up to 10.4 11/14--clinically no  signs oif blood loss 16. Open angle glaucoma:  Stable on Xalatan.        LOS: 14 days A FACE TO FACE EVALUATION WAS PERFORMED  Meredith Staggers 02/06/2021, 8:15 AM

## 2021-02-06 NOTE — Progress Notes (Signed)
Occupational Therapy Session Note  Patient Details  Name: Ann Chaney MRN: [id number] Date of Birth: [birth year]  Today's Date: 02/06/2021 OT Individual Time: 1420-1500 OT Individual Time Calculation (min): 40 min    Short Term Goals: Week 2:  OT Short Term Goal 1 (Week 2): LTG =STG 2/2 ELOS   Skilled Therapeutic Interventions/Progress Updates:    Pt received supine, agreeable to OT session. C/o leg pain, R inner thigh, 6/10, rest breaks required as needed for pain. Pt completed ambulatory transfer into the bathroom with very light CGA HHA, no AD. Toileting tasks with (S) overall. Pt was taken via w/c to the therapy gym. She used the BITS to work on visual scanning, processing time, RUE NMR, and dynamic standing balance. 2.90 sec reaction time with slower reaction time to targets in the peripheral of the screen. Pt then completed alphabet sequencing task with rotator component to challenge dynamic balance and visual convergence/focusing. Pt with moderate difficulty sequencing the letters, requiring mod cueing for both working memory and organization of thought. Pt able to stand for almost 5 min without break or LOB. Pt ended with a maze task- very poor awareness and error recognition. Pt was returned to her room and was left supine with all needs met, bed alarm set.   Therapy Documentation Precautions:  Precautions Precautions: Fall Precaution Comments: mild R hemiplegia Restrictions Weight Bearing Restrictions: No  Therapy/Group: Individual Therapy  Curtis Sites 02/06/2021, 6:22 AM

## 2021-02-06 NOTE — Progress Notes (Signed)
Physical Therapy Session Note  Patient Details  Name: Ann Chaney MRN: [id number] Date of Birth: 05/19/1945  Today's Date: 02/06/2021 PT Individual Time: 0910-0945 PT Individual Time Calculation (min): 35 min   Short Term Goals: Week 2:  PT Short Term Goal 1 (Week 2): STG= LTG based on ELOS  Skilled Therapeutic Interventions/Progress Updates:     Patient in w/c in the room with nurse providing morning medications, patient requires increased time to take meds. Patient missed 10 min of skilled PT due to nursing care, RN made aware. Will attempt to make-up missed time as able. Patient alert and agreeable to PT session. Patient denied pain during session.  Therapeutic Activity: Transfers: Patient performed sit to/from stand x2 with supervision using RW. Provided verbal cues for reaching back to sit for safety on both trials.  Gait Training:  6 Min Walk Test:  Instructed patient to ambulate as quickly and as safely as possible for 6 minutes using LRAD. Patient was allowed to take standing rest breaks without stopping the test, but if the patient required a sitting rest break the clock would be stopped and the test would be over.  Results: 510 feet (155.5 meters, Avg speed 0.41m/s) using a RW with supervision, RPE 7-8/10 at end of test. Results indicate that the patient has reduced endurance with ambulation compared to age matched norms (Female 77-79 y.o = 471 meters).  Patient ambulated 168 feet using RW with supervision. Ambulated with decreased gait speed, decreased step length and height, increased B hip and knee flexion in stance, forward trunk lean, and downward head gaze. Provided verbal cues for safe proximity to RW, erect posture, and increased gait speed.  Discussed d/c planning, fall recovery, activating emergency services in the event of fall with injury, head impact, or signs of stroke, and supervision level of assist with patient and her daughter. Both stated understanding and are  eager for the patient to be home. No further questions/concerns at this time.   Patient in the w/c with her daughter in the room at end of session with breaks locked, seat belt alarm set, and all needs within reach.   Therapy Documentation Precautions:  Precautions Precautions: Fall Precaution Comments: mild R hemiplegia Restrictions Weight Bearing Restrictions: No General: PT Amount of Missed Time (min): 10 Minutes PT Missed Treatment Reason: Nursing care (Nurse providing meds)    Therapy/Group: Individual Therapy  Ann Chaney PT, DPT  02/06/2021, 10:43 AM

## 2021-02-06 NOTE — Progress Notes (Signed)
Speech Language Pathology Discharge Summary  Patient Details  Name: Ann Chaney MRN: [id number] Date of Birth: 1945/07/25  Today's Date: 02/06/2021 SLP Individual Time: 0815-0900 SLP Individual Time Calculation (min): 45 min   Skilled Therapeutic Interventions:  Skilled treatment session focused on cognitive goals. Upon arrival, patient was awake in bed and agreeable to participate in treatment session. Patient ambulated to the sink with the walker and performed basic self-care tasks with extra time and supervision level verbal cues for problem solving. SLP administered the [facility] Mental Status Examination (SLUMS). Patient scored  16/30 points with a score of 27 or above considered normal. Patient demonstrated deficits in short-term recall, verbal fluency, and problem solving. Patient demonstrated increased social engagement and verbal expression at the basic conversation level. Patient left upright in the wheelchair with alarm on and all needs within reach.    Patient has met 4 of 4 long term goals.  Patient to discharge at overall Min;Mod level.   Reasons goals not met: N/A   Clinical Impression/Discharge Summary: Patient has made slow but functional gains and has met 4 of 4 LTGs this admission. Currently, patient requires extra time and overall Min-Mod A verbal cues to complete functional and familiar tasks in regards to problem solving, initiation, cessation of tasks and sustained attention. Patient continues to require Mod-Max A multimodal cues for recall of daily information as well as error awareness. Patient's overall functional communication has also improved but can fluctuate throughout the day. Overall, patient requires extra time and Min-Mod A multimodal cues for spontaneous verbalizations at the sentence level with her overall verbal expression improving during structured tasks. Patient demonstrates improved auditory comprehension and can follow multi-step commands  with extra time. Patient and family education is complete and patient will discharge home with 24 hour supervision which family reports they can provide at discharge. Patient would benefit from f/u home health SLP services to maximize her cognitive-linguistic functioning and overall functional independence in order to reduce caregiver burden.   Care Partner:  Caregiver Able to Provide Assistance: Yes  Type of Caregiver Assistance: Physical;Cognitive  Recommendation:  24 hour supervision/assistance;Home Health SLP  Rationale for SLP Follow Up: Reduce caregiver burden;Maximize cognitive function and independence;Maximize functional communication   Equipment: N/A   Reasons for discharge: Discharged from hospital;Treatment goals met   Patient/Family Agrees with Progress Made and Goals Achieved: Yes    [place], [place] 02/06/2021, 6:24 AM

## 2021-02-07 DIAGNOSIS — K5901 Slow transit constipation: Secondary | ICD-10-CM

## 2021-02-07 DIAGNOSIS — E119 Type 2 diabetes mellitus without complications: Secondary | ICD-10-CM

## 2021-02-07 DIAGNOSIS — R5381 Other malaise: Secondary | ICD-10-CM

## 2021-02-07 LAB — GLUCOSE, CAPILLARY: Glucose-Capillary: 129 mg/dL — ABNORMAL HIGH (ref 70–99)

## 2021-02-07 MED ORDER — METHIMAZOLE 5 MG PO TABS: 2.5000 mg | ORAL_TABLET | Freq: Every morning | ORAL | 0 refills | Status: DC

## 2021-02-07 MED ORDER — FERROUS SULFATE 325 (65 FE) MG PO TABS: 325.0000 mg | ORAL_TABLET | Freq: Every day | ORAL | 0 refills | Status: DC

## 2021-02-07 MED ORDER — PANTOPRAZOLE SODIUM 40 MG PO TBEC: 40.0000 mg | DELAYED_RELEASE_TABLET | Freq: Every day | ORAL | 0 refills | Status: DC

## 2021-02-07 MED ORDER — METHYLPHENIDATE HCL 5 MG PO TABS: 5.0000 mg | ORAL_TABLET | Freq: Two times a day (BID) | ORAL | 0 refills | Status: DC

## 2021-02-07 MED ORDER — SENNOSIDES-DOCUSATE SODIUM 8.6-50 MG PO TABS: 2.0000 | ORAL_TABLET | Freq: Every day | ORAL | 0 refills | Status: DC

## 2021-02-07 MED ORDER — METOPROLOL SUCCINATE ER 50 MG PO TB24: 50.0000 mg | ORAL_TABLET | Freq: Every day | ORAL | 0 refills | Status: DC

## 2021-02-07 MED ORDER — METFORMIN HCL 1000 MG PO TABS: 500.0000 mg | ORAL_TABLET | Freq: Two times a day (BID) | ORAL | 0 refills | Status: DC

## 2021-02-07 MED ORDER — TRAZODONE HCL 50 MG PO TABS: 25.0000 mg | ORAL_TABLET | Freq: Every day | ORAL | 0 refills | Status: DC

## 2021-02-07 MED ORDER — PIOGLITAZONE HCL 15 MG PO TABS: 15.0000 mg | ORAL_TABLET | Freq: Every day | ORAL | 0 refills | Status: DC

## 2021-02-07 MED ORDER — LISINOPRIL 40 MG PO TABS: 40.0000 mg | ORAL_TABLET | Freq: Every day | ORAL | 0 refills | Status: DC

## 2021-02-07 MED ORDER — MELATONIN 3 MG PO TABS: 3.0000 mg | ORAL_TABLET | Freq: Every day | ORAL | 0 refills | Status: DC

## 2021-02-07 NOTE — Progress Notes (Signed)
PROGRESS NOTE   Subjective/Complaints: Pt up in bed. No complaints. Staples out without any issues  ROS: Limited due to cognitive/behavioral    Objective:   No results found. No results for input(s): WBC, HGB, HCT, PLT in the last 72 hours.   Recent Labs    02/06/21 0514  NA 138  K 4.0  CL 103  CO2 26  GLUCOSE 119*  BUN 13  CREATININE 0.84  CALCIUM 9.3     Intake/Output Summary (Last 24 hours) at 02/07/2021 0917 Last data filed at 02/07/2021 0740 Gross per 24 hour  Intake 702 ml  Output --  Net 702 ml        Physical Exam: Vital Signs Blood pressure (!) 114/59, pulse 75, temperature 98.3 F (36.8 C), resp. rate 14, height 5\' 1"  (1.549 m), weight 57.4 kg, SpO2 99 %.   Constitutional: No distress . Vital signs reviewed. HEENT: NCAT, EOMI, oral membranes moist Neck: supple Cardiovascular: RRR without murmur. No JVD    Respiratory/Chest: CTA Bilaterally without wheezes or rales. Normal effort    GI/Abdomen: BS +, non-tender, non-distended Ext: no clubbing, cyanosis, or edema Psych: pleasant and cooperative  Skin: staples out of scalp Neuro:  Pt remains oriented to person and month.more on point and engaging. Normal language and speech. Limited insight and awareness but stable to improved.. 4/5 in BUE and BLE Musculoskeletal: normal PROM, no pain    Assessment/Plan: 1. Functional deficits which require 3+ hours per day of interdisciplinary therapy in a comprehensive inpatient rehab setting. Physiatrist is providing close team supervision and 24 hour management of active medical problems listed below. Physiatrist and rehab team continue to assess barriers to discharge/monitor patient progress toward functional and medical goals  Care Tool:  Bathing    Body parts bathed by patient: Right arm, Chest, Left arm, Abdomen, Front perineal area, Buttocks, Right upper leg, Left upper leg, Right lower leg,  Left lower leg, Face         Bathing assist Assist Level: Supervision/Verbal cueing     Upper Body Dressing/Undressing Upper body dressing   What is the patient wearing?: Pull over shirt    Upper body assist Assist Level: Set up assist    Lower Body Dressing/Undressing Lower body dressing      What is the patient wearing?: Incontinence brief, Pants     Lower body assist Assist for lower body dressing: Supervision/Verbal cueing     Toileting Toileting    Toileting assist Assist for toileting: Supervision/Verbal cueing     Transfers Chair/bed transfer  Transfers assist     Chair/bed transfer assist level: Supervision/Verbal cueing     Locomotion Ambulation   Ambulation assist      Assist level: Supervision/Verbal cueing Assistive device: Walker-rolling Max distance: 300'   Walk 10 feet activity   Assist     Assist level: Supervision/Verbal cueing Assistive device: Walker-rolling   Walk 50 feet activity   Assist Walk 50 feet with 2 turns activity did not occur: Safety/medical concerns  Assist level: Supervision/Verbal cueing Assistive device: Walker-rolling    Walk 150 feet activity   Assist Walk 150 feet activity did not occur: Safety/medical concerns  Assist level:  Supervision/Verbal cueing Assistive device: Walker-rolling    Walk 10 feet on uneven surface  activity   Assist Walk 10 feet on uneven surfaces activity did not occur: Safety/medical concerns   Assist level: Supervision/Verbal cueing Assistive device: Walker-rolling   Wheelchair     Assist Is the patient using a wheelchair?: No Type of Wheelchair: Manual    Wheelchair assist level: Total Assistance - Patient < 25% Max wheelchair distance: 6 feet, unable to attend or motor plan with max cues    Wheelchair 50 feet with 2 turns activity    Assist        Assist Level: Total Assistance - Patient < 25%   Wheelchair 150 feet activity     Assist       Assist Level: Total Assistance - Patient < 25%   Blood pressure (!) 114/59, pulse 75, temperature 98.3 F (36.8 C), resp. rate 14, height 5\' 1"  (1.549 m), weight 57.4 kg, SpO2 99 %.  Medical Problem List and Plan: 1.  Impaired mobility following large parasagittal meningioma resection             -dc home today  -CHPMR, NS, PCP f/u 2.  Antithrombotics: -dc home today, encourage mobility at home 3. Pain Management: Continue hydrocodone prn.  4. Mood/sleep: begin low dose trazodone for sleep 25mg   -ritalin for daytime wakefulness--continue as outpt - Continue melatonin HS             -antipsychotic agents: N/A 5. Neuropsych: This patient is not fully capable of making decisions on her own behalf.  11/15 continue trial of ritalin at current dose of 5mg  bid for arousal/initiation. Seems to have provided some benefit 6. Skin/Wound Care: crani incision healing nicely 7. Fluids/Electrolytes/Nutrition: Monitor I/O.              --eating well -11/16 today's potassium improved to 4.0 8. HTN: Monitor BP TID- Continue Lisinopril and Toprol.              --11/14 BP/HR stable  9. T2DM: Hgb A1C-6.2 and well controlled on Metformin, Actos and Glipizide             --Monitor BS ac/hs and use SSI for elevated BS.            CBG (last 3)  Recent Labs    02/06/21 1643 02/06/21 2102 02/07/21 0603  GLUCAP 146* 186* 129*      11/17 reasonable control on metformin and actos -continue to hold glipizide--consider resuming as outpt 10. Goiter/Hyperthyroidism: Continue Methimazole.  11. Seizure pophylaxis: Has been seizure free  since surgery. Keppra stopped 12. Constipation: continue Senna S at bedtime.   11/17   -sorbitol yesterday with results 13. Hyponatremia: Na stable at 137.  14. Leucocytosis: resolved, d/t steroids   15. Acute blood loss anemia: hgb recovering back to up to 10.4 11/14--clinically no signs oif blood loss 16. Open angle glaucoma:  Stable on Xalatan.        LOS: 15  days A FACE TO FACE EVALUATION WAS PERFORMED  Meredith Staggers 02/07/2021, 9:17 AM

## 2021-02-07 NOTE — Progress Notes (Signed)
Patient ID: Ann Chaney, female   DOB: 06-03-1945, 75 y.o.   MRN: [id number]  SW waiting on updates from [name] about HHPT/OT/SLP referral. *declined as no staffing in pt area. SW sent referral to [facility] and waiting on follow-up.  SW called pt dtr [name] to inform on above, and will bring by handicap placard prior to discharge.   *Referral accepted by [facility].   [name], MSW, [place] Office: [phone] Cell: [phone] Fax: [phone]

## 2021-02-07 NOTE — Discharge Summary (Signed)
Physician Discharge Summary  Patient ID: Ann Chaney MRN: [id number] DOB/AGE: 1945/09/03 75 y.o.  Admit date: 01/23/2021 Discharge date: 02/07/2021  Discharge Diagnoses:  Principal Problem:   S/P resection of meningioma Active Problems:   Hypertension associated with diabetes ([place])   Insomnia   Mixed diabetic hyperlipidemia associated with type 2 diabetes mellitus (HCC)   Multinodular goiter   Constipation   Discharged Condition: good  Significant Diagnostic Studies: N/A   Labs:  Basic Metabolic Panel: BMP Latest Ref Rng & Units 02/06/2021 02/04/2021 01/28/2021  Glucose 70 - 99 mg/dL 119(H) 134(H) 99  BUN 8 - 23 mg/dL 13 13 10   Creatinine 0.44 - 1.00 mg/dL 0.84 0.82 0.83  BUN/Creat Ratio 12 - 28 - - -  Sodium 135 - 145 mmol/L 138 137 137  Potassium 3.5 - 5.1 mmol/L 4.0 5.0 3.5  Chloride 98 - 111 mmol/L 103 107 105  CO2 22 - 32 mmol/L 26 23 26   Calcium 8.9 - 10.3 mg/dL 9.3 9.0 8.9      CBC: CBC Latest Ref Rng & Units 02/04/2021 01/28/2021 01/24/2021  WBC 4.0 - 10.5 K/uL 4.4 5.0 7.0  Hemoglobin 12.0 - 15.0 g/dL 10.4(L) 9.8(L) 9.7(L)  Hematocrit 36.0 - 46.0 % 32.5(L) 31.2(L) 29.8(L)  Platelets 150 - 400 K/uL 328 318 305      CBG: Recent Labs  Lab 02/06/21 2102 02/07/21 0603  GLUCAP 186* 129*    Brief HPI:   SANAIYAH KIRCHHOFF is a 75 y.o. female with history of T2DM, HTN, hypothyroidism, cognitive decline, headache and confusion for past 5 to 6 months who was found to have large left frontal meningioma with partial effacement of left greater than right ventricle and 6 mm mass-effect.  She was started on Decadron and admitted on 01/14/2021 for bifrontal craniotomy with resection of left parasagittal extra-axial tumor by Dr. Trenton Gammon.  Postop course significant for right-sided weakness with decrease in verbal output.  MRI brain done revealing mass resection without evidence of residual and small cortical and subcortical infarct in bifrontal to right occipital region.   Keppra added prophylactically and EEG done was negative for seizures.  Her mentation and verbal output was improving but she continued to have delay in processing, shuffling gait with tendency to drag right foot, difficulty utilizing LUE as well as difficulty with problem-solving.  CIR was recommended due to functional decline.     Hospital Course: [name] was admitted to rehab 01/23/2021 for inpatient therapies to consist of PT, ST and OT at least three hours five days a week. Past admission physiatrist, therapy team and rehab RN have worked together to provide customized collaborative inpatient rehab.  She was started on trazodone as well as low-dose melatonin for sleep-wake disruption.  Decadron was tapered off during her stay and reactive leukocytosis has resolved. Follow-up CBC showed acute blood loss anemia to be improving.  Serial check of electrolytes showed renal status and hyponatremia has resolved.  Ritalin was added to help with attention and activation.  She is tolerating this without side effects and blood pressures are currently stable.  Her cranial incision has been healing well and staples were removed without difficulty.  Wound is clean, dry and intact without any signs of infection.  Senna was added to help manage constipation and bowel program has been augmented on and off during her stay.  She has been seizure-free since surgery and Keppra was discontinued.  Her diabetes has been monitored with ac/hs CBG checks and SSI was use prn  for tighter BS control. As steroids weaned off, blood sugars have improved and metformin was reduced to 500 mg bid to prevent hypoglycemic episodes. Po intake has been good and BS are reasonably controlled on current regimen.  Mood has been stable and she has made steady gains.  She requires supervision for mobility and min to mod assist with cognitive tasks.  She will continue to receive follow-up home health PT, OT and ST by [facility] after  discharge.   Rehab course: During patient's stay in rehab weekly team conferences were held to monitor patient's progress, set goals and discuss barriers to discharge. At admission, patient required min assist with mobility and with ADL tasks. She exhibited severe expressive and receptive language deficits with minimal verbal output when fatigued as well as attention deficits. She  has had improvement in activity tolerance, balance, postural control as well as ability to compensate for deficits. She has had improvement in functional use RUE  and RLE as well as improvement in awareness.  She is able to complete ADL tasks with supervision. She requires supervision with cues for transfers and to ambulate 300' with RW.  She requires min to mod assist with cognitive task, short-term recall and verbal fluency.  Family education was completed regarding all aspects of safety and care.     Disposition: Home   Diet: Heart healthy.   Special Instructions:   Allergies as of 02/07/2021       Reactions   Atorvastatin Other (See Comments)   Myalgias   Pravastatin Sodium Nausea Only        Medication List     STOP taking these medications    alendronate 70 MG tablet Commonly known as: FOSAMAX   dexamethasone 1 MG tablet Commonly known as: DECADRON   glipiZIDE 5 MG tablet Commonly known as: GLUCOTROL   levETIRAcetam 500 MG tablet Commonly known as: KEPPRA       TAKE these medications    Accu-Chek Aviva Plus test strip Generic drug: glucose blood 1 each daily.   Accu-Chek Softclix Lancets lancets   acetaminophen 500 MG tablet Commonly known as: TYLENOL Take 500 mg by mouth every 6 (six) hours as needed (pain).   ferrous sulfate 325 (65 FE) MG tablet Take 1 tablet (325 mg total) by mouth daily with breakfast.   latanoprost 0.005 % ophthalmic solution Commonly known as: XALATAN Place 1 drop into both eyes at bedtime.   lisinopril 40 MG tablet Commonly known as: ZESTRIL Take  1 tablet (40 mg total) by mouth daily.   melatonin 3 MG Tabs tablet Take 1 tablet (3 mg total) by mouth at bedtime.   metFORMIN 1000 MG tablet Commonly known as: GLUCOPHAGE Take 0.5 tablets (500 mg total) by mouth 2 (two) times daily. What changed: how much to take   methimazole 5 MG tablet Commonly known as: TAPAZOLE Take 0.5 tablets (2.5 mg total) by mouth in the morning.   methylphenidate 5 MG tablet Commonly known as: RITALIN Take 1 tablet (5 mg total) by mouth 2 (two) times daily with breakfast and lunch. Notes to patient: With breakfast and lunch--for attention/energy.    metoprolol succinate 50 MG 24 hr tablet Commonly known as: TOPROL-XL Take 1 tablet (50 mg total) by mouth daily. Take with or immediately following a meal.   multivitamin with minerals Tabs tablet Take 1 tablet by mouth daily.   pantoprazole 40 MG tablet Commonly known as: PROTONIX Take 1 tablet (40 mg total) by mouth at bedtime.  pioglitazone 15 MG tablet Commonly known as: ACTOS Take 1 tablet (15 mg total) by mouth daily.   polyethylene glycol 17 g packet Commonly known as: MIRALAX / GLYCOLAX Take 17 g by mouth daily as needed for mild constipation.   senna-docusate 8.6-50 MG tablet Commonly known as: Senokot-S Take 2 tablets by mouth daily after supper.   traZODone 50 MG tablet Commonly known as: DESYREL Take 0.5 tablets (25 mg total) by mouth at bedtime.        Follow-up Information     Meredith Staggers, MD. Call.   Specialty: Physical Medicine and Rehabilitation Why: As needed Contact information: [address] [place] [address] [phone]         Juline Patch, MD. Call.   Specialty: Family Medicine Why: for post hospital follow up Contact information: [address] [place] [address] [phone]         Earnie Larsson, MD Follow up.   Specialty: Neurosurgery Why: for post op check Contact information: [address] [place] [address] [phone]                 Signed: Bary Leriche 02/13/2021, 6:30 PM

## 2021-02-07 NOTE — Progress Notes (Signed)
Inpatient Rehabilitation Discharge Medication Review by a Pharmacist  A complete drug regimen review was completed for this patient to identify any potential clinically significant medication issues.  High Risk Drug Classes Is patient taking? Indication by Medication  Antipsychotic No   Anticoagulant No   Antibiotic No   Opioid Yes Ritalin for for daytime wakefulness   Antiplatelet No   Hypoglycemics/insulin Yes Metformin, Actos for DM  Vasoactive Medication Yes Lisinopril, Toprol for HTN  Chemotherapy No   Other No      Type of Medication Issue Identified Description of Issue Recommendation(s)  Drug Interaction(s) (clinically significant)     Duplicate Therapy     Allergy     No Medication Administration End Date     Incorrect Dose     Additional Drug Therapy Needed     Significant med changes from prior encounter (inform family/care partners about these prior to discharge). D/c Glipizide, d/c Fosamax (verified with Algis Liming, PA) Consider resuming as outpatient   Other       Clinically significant medication issues were identified that warrant physician communication and completion of prescribed/recommended actions by midnight of the next day:  No  Time spent performing this drug regimen review (minutes):  53min  Mattias Walmsley S. Alford Highland, PharmD, BCPS Clinical Staff Pharmacist [email or web address] [place] 02/07/2021 10:48 AM

## 2021-02-08 ENCOUNTER — Telehealth: Payer: Self-pay

## 2021-02-08 NOTE — Progress Notes (Signed)
Inpatient Rehabilitation Care Coordinator Discharge Note   Patient Details  Name: RENIYA MCCLEES MRN: [id number] Date of Birth: [birth year]   Discharge location: D/c to home  Length of Stay: 14 days  Discharge activity level: Supervision  Home/community participation: Limited  Patient response HY:WVPXTG Literacy - How often do you need to have someone help you when you read instructions, pamphlets, or other written material from your doctor or pharmacy?: Rarely  Patient response GY:IRSWNI Isolation - How often do you feel lonely or isolated from those around you?: Rarely  Services provided included: MD, RD, PT, OT, SLP, RN, Pharmacy, Neuropsych, SW, TR, CM  Financial Services:  Charity fundraiser Utilized: [place] [employer]  Choices offered to/list presented to: Yes  Follow-up services arranged:  [place], DME Home Health Agency: [facility] for HHPT/OT/SLP    DME : [place] for junior RW and 3in1 BSC.    Patient response to transportation need: Is the patient able to respond to transportation needs?: No In the past 12 months, has lack of transportation kept you from medical appointments or from getting medications?: No In the past 12 months, has lack of transportation kept you from meetings, work, or from getting things needed for daily living?: No   Comments (or additional information):  Patient/Family verbalized understanding of follow-up arrangements:  Yes  Individual responsible for coordination of the follow-up plan: Luster Landsberg [phone]  Confirmed correct DME delivered: Rana Snare 02/08/2021    Rana Snare

## 2021-02-08 NOTE — Telephone Encounter (Signed)
SW provided updates to pt dtr Ann Chaney to inform on [facility]HA will be [facility] and provided contact information.  Case closed to SW.

## 2021-02-13 DIAGNOSIS — K5900 Constipation, unspecified: Secondary | ICD-10-CM

## 2021-03-08 ENCOUNTER — Other Ambulatory Visit: Payer: Self-pay | Admitting: Physical Medicine and Rehabilitation

## 2021-03-13 ENCOUNTER — Other Ambulatory Visit: Payer: Self-pay | Admitting: Physical Medicine and Rehabilitation

## 2021-03-27 ENCOUNTER — Other Ambulatory Visit: Payer: Self-pay | Admitting: Physical Medicine and Rehabilitation

## 2021-03-29 ENCOUNTER — Other Ambulatory Visit: Payer: Self-pay | Admitting: Physical Medicine and Rehabilitation

## 2021-04-01 ENCOUNTER — Ambulatory Visit: Payer: Medicare Other | Admitting: Family Medicine

## 2021-04-03 ENCOUNTER — Encounter: Payer: Self-pay | Admitting: Family Medicine

## 2021-04-03 ENCOUNTER — Ambulatory Visit (INDEPENDENT_AMBULATORY_CARE_PROVIDER_SITE_OTHER): Payer: Medicare Other | Admitting: Family Medicine

## 2021-04-03 ENCOUNTER — Other Ambulatory Visit: Payer: Self-pay

## 2021-04-03 VITALS — BP 144/94 | HR 88 | Ht 61.0 in | Wt 120.0 lb

## 2021-04-03 DIAGNOSIS — G4700 Insomnia, unspecified: Secondary | ICD-10-CM

## 2021-04-03 DIAGNOSIS — E0865 Diabetes mellitus due to underlying condition with hyperglycemia: Secondary | ICD-10-CM | POA: Insufficient documentation

## 2021-04-03 DIAGNOSIS — D649 Anemia, unspecified: Secondary | ICD-10-CM | POA: Diagnosis not present

## 2021-04-03 DIAGNOSIS — I10 Essential (primary) hypertension: Secondary | ICD-10-CM

## 2021-04-03 DIAGNOSIS — K219 Gastro-esophageal reflux disease without esophagitis: Secondary | ICD-10-CM | POA: Insufficient documentation

## 2021-04-03 MED ORDER — LISINOPRIL 40 MG PO TABS: 40.0000 mg | ORAL_TABLET | Freq: Every day | ORAL | 5 refills | Status: DC

## 2021-04-03 MED ORDER — METOPROLOL SUCCINATE ER 50 MG PO TB24: 50.0000 mg | ORAL_TABLET | Freq: Every day | ORAL | 5 refills | Status: DC

## 2021-04-03 MED ORDER — TRAZODONE HCL 50 MG PO TABS: 25.0000 mg | ORAL_TABLET | Freq: Every day | ORAL | 5 refills | Status: DC

## 2021-04-03 MED ORDER — FERROUS SULFATE 325 (65 FE) MG PO TABS: 325.0000 mg | ORAL_TABLET | Freq: Every day | ORAL | 5 refills | Status: DC

## 2021-04-03 MED ORDER — METFORMIN HCL 1000 MG PO TABS: 500.0000 mg | ORAL_TABLET | Freq: Two times a day (BID) | ORAL | 5 refills | Status: DC

## 2021-04-03 MED ORDER — PANTOPRAZOLE SODIUM 40 MG PO TBEC: 40.0000 mg | DELAYED_RELEASE_TABLET | Freq: Every day | ORAL | 5 refills | Status: DC

## 2021-04-03 MED ORDER — PIOGLITAZONE HCL 15 MG PO TABS: 15.0000 mg | ORAL_TABLET | Freq: Every day | ORAL | 5 refills | Status: DC

## 2021-04-03 NOTE — Progress Notes (Signed)
Date:  04/03/2021   Name:  Ann Chaney   DOB:  May 14, 1945   MRN:  [id number]   Chief Complaint: Gastroesophageal Reflux, Hypertension, Diabetes, Insomnia, and Anemia  Gastroesophageal Reflux She reports no abdominal pain, no belching, no chest pain, no choking, no coughing, no dysphagia, no early satiety, no globus sensation, no heartburn, no hoarse voice, no nausea, no sore throat or no wheezing. This is a new problem. The current episode started more than 1 year ago. She has tried a PPI for the symptoms. The treatment provided moderate relief.  Hypertension This is a chronic problem. The current episode started more than 1 year ago. The problem is controlled. Pertinent negatives include no anxiety, blurred vision, chest pain, headaches, malaise/fatigue, neck pain, orthopnea, palpitations, peripheral edema, PND, shortness of breath or sweats. Past treatments include ACE inhibitors and beta blockers. The current treatment provides moderate improvement. There are no compliance problems.  There is no history of CAD/MI, CVA, left ventricular hypertrophy or PVD. There is no history of chronic renal disease, a hypertension causing med or renovascular disease.  Diabetes She presents for her follow-up diabetic visit. She has type 2 diabetes mellitus. There are no hypoglycemic associated symptoms. Pertinent negatives for hypoglycemia include no dizziness, headaches, nervousness/anxiousness or sweats. Associated symptoms include polyuria. Pertinent negatives for diabetes include no blurred vision, no chest pain, no polydipsia and no visual change. There are no hypoglycemic complications. Symptoms are stable. There are no diabetic complications. Pertinent negatives for diabetic complications include no CVA or PVD. Current diabetic treatment includes oral agent (dual therapy) and diet. She is compliant with treatment all of the time. She is following a generally healthy diet. Meal planning includes avoidance  of concentrated sweets and carbohydrate counting. She participates in exercise intermittently. Her breakfast blood glucose is taken between 8-9 am. Her breakfast blood glucose range is generally 130-140 mg/dl.  Insomnia Primary symptoms: no fragmented sleep, no difficulty falling asleep, no malaise/fatigue.   The problem occurs intermittently. The problem has been gradually improving since onset.  Anemia Presents for follow-up visit. There has been no abdominal pain, bruising/bleeding easily, fever, malaise/fatigue or palpitations. There is no history of chronic renal disease. There are no compliance problems.    Lab Results  Component Value Date   NA 138 02/06/2021   K 4.0 02/06/2021   CO2 26 02/06/2021   GLUCOSE 119 (H) 02/06/2021   BUN 13 02/06/2021   CREATININE 0.84 02/06/2021   CALCIUM 9.3 02/06/2021   EGFR 73 11/28/2020   GFRNONAA >60 02/06/2021   Lab Results  Component Value Date   CHOL 220 (H) 11/28/2020   HDL 51 11/28/2020   LDLCALC 147 (H) 11/28/2020   TRIG 65 01/16/2021   CHOLHDL 6.0 (H) 10/01/2017   Lab Results  Component Value Date   TSH 0.763 07/01/2017   Lab Results  Component Value Date   HGBA1C 6.2 (H) 01/15/2021   Lab Results  Component Value Date   WBC 4.4 02/04/2021   HGB 10.4 (L) 02/04/2021   HCT 32.5 (L) 02/04/2021   MCV 77.2 (L) 02/04/2021   PLT 328 02/04/2021   Lab Results  Component Value Date   ALT 15 01/24/2021   AST 15 01/24/2021   ALKPHOS 48 01/24/2021   BILITOT 0.5 01/24/2021   Lab Results  Component Value Date   VD25OH 31.1 06/24/2016     Review of Systems  Constitutional:  Negative for chills, fever and malaise/fatigue.  HENT:  Negative for drooling, ear  discharge, ear pain, hoarse voice, nosebleeds and sore throat.   Eyes:  Negative for blurred vision.  Respiratory:  Negative for cough, choking, shortness of breath and wheezing.   Cardiovascular:  Negative for chest pain, palpitations, orthopnea, leg swelling and PND.   Gastrointestinal:  Negative for abdominal pain, anal bleeding, blood in stool, constipation, diarrhea, dysphagia, heartburn and nausea.  Endocrine: Positive for polyuria. Negative for polydipsia.  Genitourinary:  Negative for dysuria, frequency, hematuria and urgency.  Musculoskeletal:  Negative for back pain, myalgias and neck pain.  Skin:  Negative for rash.  Allergic/Immunologic: Negative for environmental allergies.  Neurological:  Negative for dizziness and headaches.  Hematological:  Does not bruise/bleed easily.  Psychiatric/Behavioral:  Negative for suicidal ideas. The patient has insomnia. The patient is not nervous/anxious.    Patient Active Problem List   Diagnosis Date Noted   Constipation 02/13/2021   S/P resection of meningioma 01/23/2021   Meningioma ([place]) 01/14/2021   Statin myopathy 03/09/2020   Statin intolerance 09/23/2019   Multinodular goiter 04/16/2018   Mixed diabetic hyperlipidemia associated with type 2 diabetes mellitus ([place]) 11/10/2017   Tobacco dependence 11/10/2017   Microcytosis 11/17/2016   Depression, major, single episode, complete remission ([place]) 11/17/2016   Insomnia 07/22/2016   Hyperlipidemia 07/22/2016   Type 2 diabetes mellitus with hyperglycemia, without long-term current use of insulin ([place]) 06/24/2016   Dental abscess 08/27/2015   Smoker 11/04/2013   Goiter 01/26/2012   Hypertension associated with diabetes ([place]) 12/17/2009    Allergies  Allergen Reactions   Atorvastatin Other (See Comments)    Myalgias   Pravastatin Sodium Nausea Only    Past Surgical History:  Procedure Laterality Date   ABDOMINAL HYSTERECTOMY     CRANIOTOMY Left 01/14/2021   Procedure: Craniotomy - left - Frontal - Tumor;  Surgeon: Earnie Larsson, MD;  Location: [place];  Service: Neurosurgery;  Laterality: Left;   HYSTEROTOMY      Social History   Tobacco Use   Smoking status: Former    Packs/day: 0.50    Types: Cigarettes    Quit date: 07/31/2020    Years  since quitting: 0.6   Smokeless tobacco: Never  Vaping Use   Vaping Use: Never used  Substance Use Topics   Alcohol use: No   Drug use: No     Medication list has been reviewed and updated.  Current Meds  Medication Sig   ACCU-CHEK AVIVA PLUS test strip 1 each daily.   Accu-Chek Softclix Lancets lancets    acetaminophen (TYLENOL) 500 MG tablet Take 500 mg by mouth every 6 (six) hours as needed (pain).   ferrous sulfate 325 (65 FE) MG tablet Take 1 tablet (325 mg total) by mouth daily with breakfast.   latanoprost (XALATAN) 0.005 % ophthalmic solution Place 1 drop into both eyes at bedtime.   lisinopril (ZESTRIL) 40 MG tablet Take 1 tablet (40 mg total) by mouth daily.   melatonin 3 MG TABS tablet Take 1 tablet (3 mg total) by mouth at bedtime.   metFORMIN (GLUCOPHAGE) 1000 MG tablet Take 0.5 tablets (500 mg total) by mouth 2 (two) times daily.   methimazole (TAPAZOLE) 5 MG tablet Take 0.5 tablets (2.5 mg total) by mouth in the morning.   metoprolol succinate (TOPROL-XL) 50 MG 24 hr tablet Take 1 tablet (50 mg total) by mouth daily. Take with or immediately following a meal.   Multiple Vitamin (MULTIVITAMIN WITH MINERALS) TABS tablet Take 1 tablet by mouth daily.   pantoprazole (PROTONIX) 40 MG  tablet Take 1 tablet (40 mg total) by mouth at bedtime.   pioglitazone (ACTOS) 15 MG tablet Take 1 tablet (15 mg total) by mouth daily.   traZODone (DESYREL) 50 MG tablet Take 0.5 tablets (25 mg total) by mouth at bedtime.   [DISCONTINUED] polyethylene glycol (MIRALAX / GLYCOLAX) 17 g packet Take 17 g by mouth daily as needed for mild constipation.   [DISCONTINUED] senna-docusate (SENOKOT-S) 8.6-50 MG tablet Take 2 tablets by mouth daily after supper.    PHQ 2/9 Scores 04/03/2021 08/08/2020 07/26/2020 04/05/2020  PHQ - 2 Score 1 0 1 0  PHQ- 9 Score 2 - 6 0    GAD 7 : Generalized Anxiety Score 04/03/2021 07/26/2020 02/09/2020 08/09/2019  Nervous, Anxious, on Edge 0 0 0 0  Control/stop worrying 0 0  0 0  Worry too much - different things 0 0 0 0  Trouble relaxing 0 1 0 0  Restless 0 2 0 0  Easily annoyed or irritable 0 2 0 0  Afraid - awful might happen 0 2 0 0  Total GAD 7 Score 0 7 0 0  Anxiety Difficulty Not difficult at all Not difficult at all - -    BP Readings from Last 3 Encounters:  04/03/21 (!) 144/94  02/07/21 115/63  01/24/21 134/64    Physical Exam Vitals and nursing note reviewed. Exam conducted with a chaperone present.  Constitutional:      General: She is not in acute distress.    Appearance: She is not diaphoretic.  HENT:     Head: Normocephalic and atraumatic.     Right Ear: External ear normal.     Left Ear: External ear normal.     Nose: Nose normal.  Eyes:     General:        Right eye: No discharge.        Left eye: No discharge.     Conjunctiva/sclera: Conjunctivae normal.     Pupils: Pupils are equal, round, and reactive to light.  Neck:     Thyroid: No thyromegaly.     Vascular: No JVD.  Cardiovascular:     Rate and Rhythm: Normal rate and regular rhythm.     Heart sounds: Normal heart sounds. No murmur heard.   No friction rub. No gallop.  Pulmonary:     Effort: Pulmonary effort is normal.     Breath sounds: Normal breath sounds.  Abdominal:     General: Bowel sounds are normal.     Palpations: Abdomen is soft. There is no mass.     Tenderness: There is no abdominal tenderness. There is no guarding.  Musculoskeletal:        General: Normal range of motion.     Cervical back: Normal range of motion and neck supple.  Lymphadenopathy:     Cervical: No cervical adenopathy.  Skin:    General: Skin is warm and dry.  Neurological:     Mental Status: She is alert.     Deep Tendon Reflexes: Reflexes are normal and symmetric.    Wt Readings from Last 3 Encounters:  04/03/21 120 lb (54.4 kg)  01/30/21 126 lb 8.7 oz (57.4 kg)  01/16/21 125 lb 10.6 oz (57 kg)    BP (!) 144/94    Pulse 88    Ht _0  (1.549 m)    Wt 120 lb (54.4 kg)     BMI 22.67 kg/m   Assessment and Plan:  1. Essential hypertension Chronic.  Controlled.  Stable.  Blood pressure today is 144/94 and patient has not had medications today.  We will continue current dosing of lisinopril 40 mg once a day and metoprolol XL 50 mg once a day and patient is to return in 4 months for repeat blood pressure check. - lisinopril (ZESTRIL) 40 MG tablet; Take 1 tablet (40 mg total) by mouth daily.  Dispense: 30 tablet; Refill: 5 - metoprolol succinate (TOPROL-XL) 50 MG 24 hr tablet; Take 1 tablet (50 mg total) by mouth daily. Take with or immediately following a meal.  Dispense: 30 tablet; Refill: 5  2. Diabetes mellitus due to underlying condition, uncontrolled, with hyperglycemia ([place]) Chronic.  Controlled.  Stable.  Continue metformin 500 mg twice a day and pioglitazone 15 mg once a day.  Will check A1c for current status of control. - metFORMIN (GLUCOPHAGE) 1000 MG tablet; Take 0.5 tablets (500 mg total) by mouth 2 (two) times daily.  Dispense: 30 tablet; Refill: 5 - pioglitazone (ACTOS) 15 MG tablet; Take 1 tablet (15 mg total) by mouth daily.  Dispense: 30 tablet; Refill: 5 - HgB A1c  3. Insomnia, unspecified type Chronic.  Controlled.  Stable.  Continue trazodone 50 mg 1/2 to 1 tablet nightly as needed insomnia. - traZODone (DESYREL) 50 MG tablet; Take 0.5 tablets (25 mg total) by mouth at bedtime.  Dispense: 15 tablet; Refill: 5  4. Anemia, unspecified type New onset.  Was noted on previous admission.  It is microcytic in nature suggesting iron deficiency.  Patient has been given Hemoccult cards to check stool for blood in the meantime patient has been encouraged to continue ferrous sulfate 325 mg daily.  We will check CBC for current status of hemoglobin hematocrit. - ferrous sulfate 325 (65 FE) MG tablet; Take 1 tablet (325 mg total) by mouth daily with breakfast.  Dispense: 30 tablet; Refill: 5 - CBC w/Diff/Platelet  5. Gastroesophageal reflux disease,  unspecified whether esophagitis present Chronic.  Controlled.  Stable.  Continue pantoprazole 40 mg once a day. - pantoprazole (PROTONIX) 40 MG tablet; Take 1 tablet (40 mg total) by mouth at bedtime.  Dispense: 30 tablet; Refill: 5

## 2021-04-04 LAB — CBC WITH DIFFERENTIAL/PLATELET
Basophils Absolute: 0 10*3/uL (ref 0.0–0.2)
Basos: 1 %
EOS (ABSOLUTE): 0.1 10*3/uL (ref 0.0–0.4)
Eos: 2 %
Hematocrit: 41.3 % (ref 34.0–46.6)
Hemoglobin: 12.7 g/dL (ref 11.1–15.9)
Immature Grans (Abs): 0 10*3/uL (ref 0.0–0.1)
Immature Granulocytes: 0 %
Lymphocytes Absolute: 3.3 10*3/uL — ABNORMAL HIGH (ref 0.7–3.1)
Lymphs: 59 %
MCH: 23.8 pg — ABNORMAL LOW (ref 26.6–33.0)
MCHC: 30.8 g/dL — ABNORMAL LOW (ref 31.5–35.7)
MCV: 77 fL — ABNORMAL LOW (ref 79–97)
Monocytes Absolute: 0.5 10*3/uL (ref 0.1–0.9)
Monocytes: 9 %
Neutrophils Absolute: 1.6 10*3/uL (ref 1.4–7.0)
Neutrophils: 29 %
Platelets: 372 10*3/uL (ref 150–450)
RBC: 5.34 x10E6/uL — ABNORMAL HIGH (ref 3.77–5.28)
RDW: 14.6 % (ref 11.7–15.4)
WBC: 5.6 10*3/uL (ref 3.4–10.8)

## 2021-04-04 LAB — HEMOGLOBIN A1C
Est. average glucose Bld gHb Est-mCnc: 143 mg/dL
Hgb A1c MFr Bld: 6.6 % — ABNORMAL HIGH (ref 4.8–5.6)

## 2021-04-12 ENCOUNTER — Other Ambulatory Visit: Payer: Self-pay

## 2021-04-12 ENCOUNTER — Emergency Department (HOSPITAL_COMMUNITY)
Admission: EM | Admit: 2021-04-12 | Discharge: 2021-04-12 | Disposition: A | Discharge status: Home / Self Care | Payer: Medicare Other | Attending: Emergency Medicine | Admitting: Emergency Medicine

## 2021-04-12 ENCOUNTER — Encounter (HOSPITAL_COMMUNITY): Payer: Self-pay | Admitting: Emergency Medicine

## 2021-04-12 ENCOUNTER — Emergency Department (HOSPITAL_COMMUNITY): Payer: Medicare Other

## 2021-04-12 DIAGNOSIS — Z79899 Other long term (current) drug therapy: Secondary | ICD-10-CM | POA: Insufficient documentation

## 2021-04-12 DIAGNOSIS — S0990XA Unspecified injury of head, initial encounter: Secondary | ICD-10-CM | POA: Diagnosis not present

## 2021-04-12 DIAGNOSIS — W08XXXA Fall from other furniture, initial encounter: Secondary | ICD-10-CM | POA: Insufficient documentation

## 2021-04-12 DIAGNOSIS — E119 Type 2 diabetes mellitus without complications: Secondary | ICD-10-CM | POA: Insufficient documentation

## 2021-04-12 DIAGNOSIS — R404 Transient alteration of awareness: Secondary | ICD-10-CM | POA: Diagnosis not present

## 2021-04-12 DIAGNOSIS — E039 Hypothyroidism, unspecified: Secondary | ICD-10-CM | POA: Diagnosis not present

## 2021-04-12 DIAGNOSIS — Z7984 Long term (current) use of oral hypoglycemic drugs: Secondary | ICD-10-CM | POA: Diagnosis not present

## 2021-04-12 DIAGNOSIS — I10 Essential (primary) hypertension: Secondary | ICD-10-CM | POA: Diagnosis not present

## 2021-04-12 DIAGNOSIS — I639 Cerebral infarction, unspecified: Secondary | ICD-10-CM | POA: Diagnosis not present

## 2021-04-12 DIAGNOSIS — G9389 Other specified disorders of brain: Secondary | ICD-10-CM | POA: Diagnosis not present

## 2021-04-12 DIAGNOSIS — G319 Degenerative disease of nervous system, unspecified: Secondary | ICD-10-CM | POA: Diagnosis not present

## 2021-04-12 DIAGNOSIS — R55 Syncope and collapse: Secondary | ICD-10-CM | POA: Diagnosis not present

## 2021-04-12 DIAGNOSIS — R52 Pain, unspecified: Secondary | ICD-10-CM | POA: Diagnosis not present

## 2021-04-12 DIAGNOSIS — R42 Dizziness and giddiness: Secondary | ICD-10-CM | POA: Diagnosis not present

## 2021-04-12 DIAGNOSIS — Z743 Need for continuous supervision: Secondary | ICD-10-CM | POA: Diagnosis not present

## 2021-04-12 DIAGNOSIS — R6889 Other general symptoms and signs: Secondary | ICD-10-CM | POA: Diagnosis not present

## 2021-04-12 LAB — CBC WITH DIFFERENTIAL/PLATELET
Abs Immature Granulocytes: 0.1 10*3/uL — ABNORMAL HIGH (ref 0.00–0.07)
Basophils Absolute: 0 10*3/uL (ref 0.0–0.1)
Basophils Relative: 0 %
Eosinophils Absolute: 0.1 10*3/uL (ref 0.0–0.5)
Eosinophils Relative: 1 %
HCT: 38.6 % (ref 36.0–46.0)
Hemoglobin: 12.2 g/dL (ref 12.0–15.0)
Immature Granulocytes: 1 %
Lymphocytes Relative: 25 %
Lymphs Abs: 2.3 10*3/uL (ref 0.7–4.0)
MCH: 23.8 pg — ABNORMAL LOW (ref 26.0–34.0)
MCHC: 31.6 g/dL (ref 30.0–36.0)
MCV: 75.4 fL — ABNORMAL LOW (ref 80.0–100.0)
Monocytes Absolute: 0.7 10*3/uL (ref 0.1–1.0)
Monocytes Relative: 8 %
Neutro Abs: 5.8 10*3/uL (ref 1.7–7.7)
Neutrophils Relative %: 65 %
Platelets: 322 10*3/uL (ref 150–400)
RBC: 5.12 MIL/uL — ABNORMAL HIGH (ref 3.87–5.11)
RDW: 15.1 % (ref 11.5–15.5)
WBC: 9 10*3/uL (ref 4.0–10.5)
nRBC: 0 % (ref 0.0–0.2)

## 2021-04-12 LAB — COMPREHENSIVE METABOLIC PANEL
ALT: 13 U/L (ref 0–44)
AST: 22 U/L (ref 15–41)
Albumin: 4.1 g/dL (ref 3.5–5.0)
Alkaline Phosphatase: 74 U/L (ref 38–126)
Anion gap: 11 (ref 5–15)
BUN: 11 mg/dL (ref 8–23)
CO2: 25 mmol/L (ref 22–32)
Calcium: 9.5 mg/dL (ref 8.9–10.3)
Chloride: 102 mmol/L (ref 98–111)
Creatinine, Ser: 0.81 mg/dL (ref 0.44–1.00)
GFR, Estimated: >60 mL/min (ref >60)
Glucose, Bld: 190 mg/dL — ABNORMAL HIGH (ref 70–99)
Potassium: 3.4 mmol/L — ABNORMAL LOW (ref 3.5–5.1)
Sodium: 138 mmol/L (ref 135–145)
Total Bilirubin: 0.4 mg/dL (ref 0.3–1.2)
Total Protein: 6.9 g/dL (ref 6.5–8.1)

## 2021-04-12 LAB — TROPONIN I (HIGH SENSITIVITY): Troponin I (High Sensitivity): 7 ng/L (ref <18)

## 2021-04-12 NOTE — ED Notes (Signed)
Patient verbalizes understanding of discharge instructions. Opportunity for questioning and answers were provided. Armband removed by staff, pt discharged from ED ambulatory.   

## 2021-04-12 NOTE — ED Provider Notes (Signed)
[facility] EMERGENCY DEPARTMENT Provider Note   CSN: [id number] Arrival date & time: 04/12/21  1944     History  Chief Complaint  Patient presents with   Fall/Head Injury Ann Chaney    Ann Chaney is a 76 y.o. female.  HPI Ann Chaney is a 76 year old RH female with history of T2DM, HTN, hyperthyroidism and recent meningioma resection who presents to our ED after a fall.    The patient is accompanied by her daughter who provides portions of the history.  They report that the patient was sitting on a stool this evening when she felt dizzy and fell backwards off of a stool striking her head against a table.  Patient did not have loss of consciousness.  The patient was then brought to our emergency department for further evaluation.  She states that she has no neck pain has not had continued headaches has not had further weakness or numbness.  The patient states that she has been becoming dizzy around the same time every night over the past week.  Patient reports this is a new sensation that she is experiencing.  She denies that she was feeling palpitations, chest pain, shortness of breath, abdominal pain.     Home Medications Prior to Admission medications   Medication Sig Start Date End Date Taking? Authorizing Provider  ACCU-CHEK AVIVA PLUS test strip 1 each daily. 06/24/19   [provider]  Accu-Chek Softclix Lancets lancets  06/24/19   [provider]  acetaminophen (TYLENOL) 500 MG tablet Take 500 mg by mouth every 6 (six) hours as needed (pain).    [provider]  ferrous sulfate 325 (65 FE) MG tablet Take 1 tablet (325 mg total) by mouth daily with breakfast. 04/03/21   Juline Patch, MD  latanoprost (XALATAN) 0.005 % ophthalmic solution Place 1 drop into both eyes at bedtime.    [provider]  lisinopril (ZESTRIL) 40 MG tablet Take 1 tablet (40 mg total) by mouth daily. 04/03/21   Juline Patch, MD   melatonin 3 MG TABS tablet Take 1 tablet (3 mg total) by mouth at bedtime. 02/07/21   Love, Ivan Anchors, PA-C  metFORMIN (GLUCOPHAGE) 1000 MG tablet Take 0.5 tablets (500 mg total) by mouth 2 (two) times daily. 04/03/21   Juline Patch, MD  methimazole (TAPAZOLE) 5 MG tablet Take 0.5 tablets (2.5 mg total) by mouth in the morning. 02/07/21   Love, Ivan Anchors, PA-C  methylphenidate (RITALIN) 5 MG tablet Take 1 tablet (5 mg total) by mouth 2 (two) times daily with breakfast and lunch. Patient not taking: Reported on 04/03/2021 02/08/21   Love, Ivan Anchors, PA-C  metoprolol succinate (TOPROL-XL) 50 MG 24 hr tablet Take 1 tablet (50 mg total) by mouth daily. Take with or immediately following a meal. 04/03/21   Juline Patch, MD  Multiple Vitamin (MULTIVITAMIN WITH MINERALS) TABS tablet Take 1 tablet by mouth daily.    [provider]  pantoprazole (PROTONIX) 40 MG tablet Take 1 tablet (40 mg total) by mouth at bedtime. 04/03/21   Juline Patch, MD  pioglitazone (ACTOS) 15 MG tablet Take 1 tablet (15 mg total) by mouth daily. 04/03/21   Juline Patch, MD  traZODone (DESYREL) 50 MG tablet Take 0.5 tablets (25 mg total) by mouth at bedtime. 04/03/21   Juline Patch, MD      Allergies    Atorvastatin and Pravastatin sodium    Review of Systems  Review of Systems  All other systems reviewed and are negative.  Physical Exam Updated Vital Signs BP (!) 151/73    Pulse 65    Temp 97.9 F (36.6 C) (Oral)    Resp 15    SpO2 100%  Physical Exam Vitals and nursing note reviewed.  Constitutional:      General: She is not in acute distress.    Appearance: She is well-developed.  HENT:     Head: Normocephalic and atraumatic.  Eyes:     Conjunctiva/sclera: Conjunctivae normal.  Cardiovascular:     Rate and Rhythm: Normal rate and regular rhythm.     Heart sounds: No murmur heard. Pulmonary:     Effort: Pulmonary effort is normal. No respiratory distress.     Breath sounds: Normal breath  sounds.  Abdominal:     Palpations: Abdomen is soft.     Tenderness: There is no abdominal tenderness.  Musculoskeletal:        General: No swelling.     Cervical back: Neck supple.  Skin:    General: Skin is warm and dry.     Capillary Refill: Capillary refill takes less than 2 seconds.  Neurological:     General: No focal deficit present.     Mental Status: She is alert and oriented to person, place, and time. Mental status is at baseline.     Cranial Nerves: No cranial nerve deficit.     Sensory: No sensory deficit.     Motor: No weakness.     Coordination: Coordination normal.     Gait: Gait normal.     Comments: Negative Dix-Hallpike  Psychiatric:        Mood and Affect: Mood normal.    ED Results / Procedures / Treatments   Labs (all labs ordered are listed, but only abnormal results are displayed) Labs Reviewed  CBC WITH DIFFERENTIAL/PLATELET - Abnormal; Notable for the following components:      Result Value   RBC 5.12 (*)    MCV 75.4 (*)    MCH 23.8 (*)    Abs Immature Granulocytes 0.10 (*)    All other components within normal limits  COMPREHENSIVE METABOLIC PANEL - Abnormal; Notable for the following components:   Potassium 3.4 (*)    Glucose, Bld 190 (*)    All other components within normal limits  TROPONIN I (HIGH SENSITIVITY)    EKG EKG Interpretation  Date/Time:  Friday April 12 2021 20:46:07 EST Ventricular Rate:  64 PR Interval:  128 QRS Duration: 86 QT Interval:  434 QTC Calculation: 447 R Axis:   76 Text Interpretation: Normal sinus rhythm Normal ECG  similar to Sept 2022 Confirmed by Sherwood Gambler [phone]) on 04/12/2021 10:38:50 PM  Radiology CT Head Wo Contrast  Result Date: 04/12/2021 CLINICAL DATA:  Head trauma EXAM: CT HEAD WITHOUT CONTRAST TECHNIQUE: Contiguous axial images were obtained from the base of the skull through the vertex without intravenous contrast. RADIATION DOSE REDUCTION: This exam was performed according to the  departmental dose-optimization program which includes automated exposure control, adjustment of the mA and/or kV according to patient size and/or use of iterative reconstruction technique. COMPARISON:  MRI 01/15/2021, CT brain 01/14/2021 FINDINGS: Brain: No acute territorial infarction, hemorrhage, or intracranial mass. Encephalomalacia at the left frontal lobe at the site of prior mass resection. Mild atrophy. Advanced white matter hypodensity consistent with chronic small vessel ischemic change. Small chronic infarct in the right ganglial capsular region. The ventricles are nonenlarged. Vascular: No hyperdense vessels.  Carotid vascular calcification Skull: Status post left anterior craniotomy Sinuses/Orbits: No acute finding. Other: None IMPRESSION: 1. No CT evidence for acute intracranial abnormality. 2. Patient is status post left anterior craniotomy and resection of left frontal mass with underlying encephalomalacia. 3. Atrophy and chronic small vessel ischemic changes of the white matter Electronically Signed   By: Donavan Foil M.D.   On: 04/12/2021 21:26    Procedures Procedures    Medications Ordered in ED Medications - No data to display  ED Course/ Medical Decision Making/ A&P                           Medical Decision Making Amount and/or Complexity of Data Reviewed Labs: ordered. Radiology: ordered.   This is a 76 year old female who presents to emergency department after a fall.  Differential diagnosis includes but is not limited to the following: Neurocardiogenic syncope, anemia, dysrhythmia, skull fracture, intracranial hemorrhage.  Troponin 7 was obtained in triage however I do not feel the patient is at risk for acute coronary syndrome at this time will not trendtrend troponin.  CMP was unremarkable and CBC was without anemia.  Imaging of her head was reviewed independently and was negative for acute intracranial abnormality.  EKG was also obtained which revealed that the  patient was in normal sinus rhythm without acute ischemic changes or dysrhythmia.  Given that the patient has had no shortness of breath, chest pain, with normal EKG, and has no anemia she is at low risk from a syncopal episode standpoint.  I feel comfortable discharging the patient home to the care of her daughter who she lives with 24/7.  I did instruct the patient that it would be wise to follow-up with both her primary care provider and her neurosurgery team for further evaluation of her weekly dizziness.Return precautions provided.   Final Clinical Impression(s) / ED Diagnoses Final diagnoses:  Syncope and collapse    Rx / DC Orders ED Discharge Orders     None         Zachery Dakins, MD 04/13/21 Roderic Palau    Sherwood Gambler, MD 04/15/21 [phone]

## 2021-04-12 NOTE — Discharge Instructions (Addendum)
-   Please follow up with your PCP and Neurosurgery team

## 2021-04-12 NOTE — ED Triage Notes (Signed)
Patient arrived with EMS from home lost her balance and fell backwards this evening , she hit her head against a table , no LOC , CBG= 205, hypertensive BP= 228/106 by EMS .

## 2021-04-12 NOTE — ED Provider Triage Note (Signed)
Emergency Medicine Provider Triage Evaluation Note  Ann Chaney , a 76 y.o. female  was evaluated in triage.  Pt complains of fall and hitting head.  Pt does not know how she fell.  Pt vomiting.  Pt not sure if she was vomiting before fall.  Hx of craniotomy  Review of Systems  Positive: confusion Negative: fever  Physical Exam  There were no vitals taken for this visit. Gen:   Awake, no distress   Resp:  Normal effort  MSK:   Moves extremities without difficulty  Other:   Medical Decision Making  Medically screening exam initiated at 8:07 PM.  Appropriate orders placed.  Ann Chaney was informed that the remainder of the evaluation will be completed by another provider, this initial triage assessment does not replace that evaluation, and the importance of remaining in the ED until their evaluation is complete.     Fransico Meadow, [place] 04/12/21 2008

## 2021-04-27 ENCOUNTER — Other Ambulatory Visit: Payer: Self-pay | Admitting: Family Medicine

## 2021-04-27 DIAGNOSIS — I10 Essential (primary) hypertension: Secondary | ICD-10-CM

## 2021-06-14 ENCOUNTER — Ambulatory Visit (INDEPENDENT_AMBULATORY_CARE_PROVIDER_SITE_OTHER): Payer: Medicare Other

## 2021-06-14 ENCOUNTER — Other Ambulatory Visit: Payer: Self-pay

## 2021-06-14 DIAGNOSIS — D649 Anemia, unspecified: Secondary | ICD-10-CM

## 2021-06-14 LAB — HEMOCCULT GUIAC POC 1CARD (OFFICE)
Card #2 Fecal Occult Blod, POC: NEGATIVE
Card #3 Fecal Occult Blood, POC: NEGATIVE
Fecal Occult Blood, POC: NEGATIVE

## 2021-06-26 DIAGNOSIS — I10 Essential (primary) hypertension: Secondary | ICD-10-CM | POA: Diagnosis not present

## 2021-06-26 DIAGNOSIS — D329 Benign neoplasm of meninges, unspecified: Secondary | ICD-10-CM | POA: Diagnosis not present

## 2021-07-17 ENCOUNTER — Other Ambulatory Visit: Payer: Self-pay | Admitting: Family Medicine

## 2021-07-17 DIAGNOSIS — I10 Essential (primary) hypertension: Secondary | ICD-10-CM

## 2021-07-25 DIAGNOSIS — D329 Benign neoplasm of meninges, unspecified: Secondary | ICD-10-CM | POA: Diagnosis not present

## 2021-07-25 DIAGNOSIS — R519 Headache, unspecified: Secondary | ICD-10-CM | POA: Diagnosis not present

## 2021-07-26 ENCOUNTER — Telehealth: Payer: Self-pay | Admitting: Family Medicine

## 2021-07-26 NOTE — Telephone Encounter (Signed)
Pt called to report that she has been contacted by [facility] and has been told to contact her PCP for prior auth. For all of her medications. Please advise  ?

## 2021-07-31 ENCOUNTER — Encounter: Payer: Medicare Other | Admitting: Family Medicine

## 2021-08-01 ENCOUNTER — Encounter: Payer: Self-pay | Admitting: Family Medicine

## 2021-08-01 ENCOUNTER — Ambulatory Visit (INDEPENDENT_AMBULATORY_CARE_PROVIDER_SITE_OTHER): Payer: Medicare Other | Admitting: Family Medicine

## 2021-08-01 VITALS — BP 120/80 | HR 80 | Ht 61.0 in | Wt 126.0 lb

## 2021-08-01 DIAGNOSIS — Z789 Other specified health status: Secondary | ICD-10-CM | POA: Diagnosis not present

## 2021-08-01 DIAGNOSIS — E0865 Diabetes mellitus due to underlying condition with hyperglycemia: Secondary | ICD-10-CM

## 2021-08-01 DIAGNOSIS — E785 Hyperlipidemia, unspecified: Secondary | ICD-10-CM

## 2021-08-01 MED ORDER — PIOGLITAZONE HCL 15 MG PO TABS: 15.0000 mg | ORAL_TABLET | Freq: Every day | ORAL | 5 refills | Status: DC

## 2021-08-01 MED ORDER — METFORMIN HCL 1000 MG PO TABS: 500.0000 mg | ORAL_TABLET | Freq: Two times a day (BID) | ORAL | 5 refills | Status: DC

## 2021-08-01 NOTE — Patient Instructions (Signed)
GUIDELINES FOR  ?LOW-CHOLESTEROL, LOW-TRIGLYCERIDE DIETS  ?  ?FOODS TO USE  ? ?MEATS, FISH Choose lean meats (chicken, [place], veal, and non-fatty cuts of beef with excess fat trimmed; one serving = 3 oz of cooked meat). Also, fresh or frozen fish, canned fish packed in water, and shellfish (lobster, crabs, shrimp, and oysters). Limit use to no more than one serving of one of these per week. Shellfish are high in cholesterol but low in saturated fat and should be used sparingly. Meats and fish should be broiled (pan or oven) or baked on a rack.  ?EGGS Egg substitutes and egg whites (use freely). Egg yolks (limit two per week).  ?FRUITS Eat three servings of fresh fruit per day (1 serving = ? cup). Be sure to have at least one citrus fruit daily. Frozen and canned fruit with no sugar or syrup added may be used.  ?VEGETABLES Most vegetables are not limited (see next page). One dark-green (string beans, escarole) or one deep yellow (squash) vegetable is recommended daily. Cauliflower, broccoli, and celery, as well as potato skins, are recommended for their fiber content. (Fiber is associated with cholesterol reduction) It is preferable to steam vegetables, but they may be boiled, strained, or braised with polyunsaturated vegetable oil (see below).  ?BEANS Dried peas or beans (1 serving = ? cup) may be used as a bread substitute.  ?NUTS Almonds, walnuts, and peanuts may be used sparingly  ?(1 serving = 1 Tablespoonful). Use pumpkin, sesame, or sunflower seeds.  ?BREADS, GRAINS One roll or one slice of whole grain or enriched bread may be used, or three soda crackers or four pieces of melba toast as a substitute. Spaghetti, rice or noodles (? cup) or ? large ear of corn may be used as a bread substitute. In preparing these foods do not use butter or shortening, use soft margarine. Also use egg and sugar substitutes.  Choose high fiber grains, such as oats and whole wheat.  ?CEREALS Use ? cup of hot cereal or ? cup of  cold cereal per day. Add a sugar substitute if desired, with 99% fat free or skim milk.  ?MILK PRODUCTS Always use 99% fat free or skim milk, dairy products such as low fat cheeses (farmer's uncreamed diet cottage), low-fat yogurt, and powdered skim milk.  ?FATS, OILS Use soft (not stick) margarine; vegetable oils that are high in polyunsaturated fats (such as safflower, sunflower, soybean, corn, and cottonseed). Always refrigerate meat drippings to harden the fat and remove it before preparing gravies  ?DESSERTS, SNACKS Limit to two servings per day; substitute each serving for a bread/cereal serving: ice milk, water sherbet (1/4 cup); unflavored gelatin or gelatin flavored with sugar substitute (1/3 cup); pudding prepared with skim milk (1/2 cup); egg white souffl?s; unbuttered popcorn (1 ? cups). Substitute carob for chocolate.  ?BEVERAGES Fresh fruit juices (limit 4 oz per day); black coffee, plain or herbal teas; soft drinks with sugar substitutes; club soda, preferably salt-free; cocoa made with skim milk or nonfat dried milk and water (sugar substitute added if desired); clear broth. Alcohol: limit two servings per day (see second page).  ?MISCELLANEOUS ? You may use the following freely: vinegar, spices, herbs, nonfat bouillon, mustard, Worcestershire sauce, soy sauce, flavoring essence.  ? ? ? ? ? ? ? ? ? ? ? ? ? ? ? ? ?GUIDELINES FOR  ?LOW-CHOLESTEROL, LOW TRIGLYCERIDE DIETS  ?  ?FOODS TO AVOID  ? ?MEATS, FISH Marbled beef, pork, bacon, sausage, and other pork products; fatty  fowl (duck, goose); skin and fat of [place] and chicken; processed meats; luncheon meats (salami, bologna); frankfurters and fast-food hamburgers (theyre loaded with fat); organ meats (kidneys, liver); canned fish packed in oil.  ?EGGS Limit egg yolks to two per week.   ?FRUITS Coconuts (rich in saturated fats).  ?VEGETABLES Avoid avocados. Starchy vegetables (potatoes, corn, lima beans, dried peas, beans) may be used only if  substitutes for a serving of bread or cereal. (Baked potato skin, however, is desirable for its fiber content.  ?BEANS Commercial baked beans with sugar and/or pork added.  ?NUTS Avoid nuts.  Limit peanuts and walnuts to one tablespoonful per day.  ?BREADS, GRAINS Any baked goods with shortening and/or sugar. Commercial mixes with dried eggs and whole milk. Avoid sweet rolls, doughnuts, breakfast pastries (Danish), and sweetened packaged cereals (the added sugar converts readily to triglycerides).  ?MILK PRODUCTS Whole milk and whole-milk packaged goods; cream; ice cream; whole-milk puddings, yogurt, or cheeses; nondairy cream substitutes.  ?FATS, OILS Butter, lard, animal fats, bacon drippings, gravies, cream sauces as well as palm and coconut oils. All these are high in saturated fats. Examine labels on cholesterol free products for hydrogenated fats. (These are oils that have been hardened into solids and in the process have become saturated.)  ?DESSERTS, SNACKS Fried snack foods like potato chips; chocolate; candies in general; jams, jellies, syrups; whole- milk puddings; ice cream and milk sherbets; hydrogenated peanut butter.  ?BEVERAGES Sugared fruit juices and soft drinks; cocoa made with whole milk and/or sugar. When using alcohol (1 oz liquor, 5 oz beer, or 2 ? oz dry table wine per serving), one serving must be substituted for one bread or cereal serving (limit, two servings of alcohol per day).  ? SPECIAL NOTES  ?  Remember that even non-limited foods should be used in moderation. ?While on a cholesterol-lowering diet, be sure to avoid animal fats and marbled meats. ?3. While on a triglyceride-lowering diet, be sure to avoid sweets and to control the amount of carbohydrates you eat (starchy foods such as flour, bread, potatoes).While on a tri-glyceride-lowering diet, be sure to avoid sweets ?Buy a good low-fat cookbook, such as the one published by the American Heart Association. ?Consult your physician  if you have any questions.  ? ? ? ? ? ? ? ? ? ? ? ? ? ?[facility] Low Glycemic Diet Plan ? ? ?Low Glycemic Foods (20-49) Moderate Glycemic Foods (50-69) High Glycemic Foods (70-100)  ?    ?Breakfast Creals Breakfast Cereals Breakfast Cereals  ?All Bran All-Bran Fruit'n Oats  ? Bran Buds Bran Chex  ? Cheerios Corn chex  ?  ?Fiber One Oatmeal (not instant)  ? Just Right Mini-Wheats  ? Corn Flakes Cream of Wheat  ?  ?Oat Bran Special K Swiss Muesli  ? Grape Nuts Grape Nut Flakes  ?  ?  Grits Nutri-Grain  ?  ?Fruits and fruit juice: Fruits Puffed Rice Puffed Wheat  ?  ?(Limit to 1-2 Servings per day) Banana (under-ride) Dates  ? Rice Chex Rice Krispies  ?  ?Apples Apricots (fresh/dried)  ? Figs Grapes  ? Shredded Wheat Team  ?  ?Blackberries Blueberries  ? Kiwi Mango  ? Total   ?  ?Cherries Cranberries  ? Oranges Raisins  ?   ?Peaches Pears  ?  Fruits  ?Plums Prunes  ? Fruit Juices Pineapple Watermelon  ?  ?Grapefruit Raspberries  ? Cranberry Juice Orange Juice  ? Banana (over-ripe)   ?  ?Strawberries Tangerines  ?    ?  Apple Juice Grapefruit Juice  ? Beans and Legumes Beverages  ?Tomato Juice   ? [place]-type baked beans Sodas, sweet tea, pineapple juice  ? Canned pinto, kidney, or navy beans   ?Beans and Legumes (fresh-cooked) Green peas Vegetables  ?Black-eyed peas Butter Beans  ?  Potato, baked, boiled, fried, mashed  ?Chick peas Lentils  ? Vegetables French fries  ?Green beans Lima beans  ? Beets Carrots  ? Canned or frozen corn  ?Kidney beans Navy beans  ? Sweet potato Yam  ? Parsnips  ?Pinto beans Snow peas  ? Corn on the cob Winter squash  ?    ?Non-starchy vegetables Grains Breads  ?Asparagus, avocado, broccoli, cabbage Cornmeal Rice, brown  ? Most breads (white and whole grain)  ?cauliflower, celery, cucumber, greens Rice, white Couscous  ? Bagels Bread sticks  ?  ?lettuce, mushrooms, peppers, tomatoes  Bread stuffing Kaiser roll  ?  ?okra, onions, spinach, summer squash Pasta Dinner rolls  ? [place], cheese  ?   ?Grains Ravioli, meat filled Spaghetti, white  ? Grains  ?Barley Bulgur  ?  Rice, instant Tapioca, with milk  ?  ?Rye Wild rice  ? Nuts   ? Cashews Macadamia  ? Candy and most cookies  ?Nuts and o

## 2021-08-01 NOTE — Progress Notes (Signed)
? ? ?Date:  08/01/2021  ? ?Name:  Ann Chaney   DOB:  June 10, 1945   MRN:  [id number] ? ? ?Chief Complaint: Diabetes ? ?Diabetes ?She presents for her follow-up diabetic visit. She has type 2 diabetes mellitus. Her disease course has been stable. There are no hypoglycemic associated symptoms. Pertinent negatives for hypoglycemia include no dizziness, headaches or nervousness/anxiousness. There are no diabetic associated symptoms. Pertinent negatives for diabetes include no fatigue and no weakness. There are no hypoglycemic complications. Symptoms are stable. There are no diabetic complications. Risk factors for coronary artery disease include diabetes mellitus. Current diabetic treatment includes oral agent (dual therapy). Her weight is fluctuating minimally. She is following a generally healthy diet. Meal planning includes avoidance of concentrated sweets and carbohydrate counting. She participates in exercise intermittently. There is no change in her home blood glucose trend. Her breakfast blood glucose is taken between 8-9 am. Her breakfast blood glucose range is generally 90-110 mg/dl.  ? ?Lab Results  ?Component Value Date  ? NA 138 04/12/2021  ? K 3.4 (L) 04/12/2021  ? CO2 25 04/12/2021  ? GLUCOSE 190 (H) 04/12/2021  ? BUN 11 04/12/2021  ? CREATININE 0.81 04/12/2021  ? CALCIUM 9.5 04/12/2021  ? EGFR 73 11/28/2020  ? GFRNONAA >60 04/12/2021  ? ?Lab Results  ?Component Value Date  ? CHOL 220 (H) 11/28/2020  ? HDL 51 11/28/2020  ? LDLCALC 147 (H) 11/28/2020  ? TRIG 65 01/16/2021  ? CHOLHDL 6.0 (H) 10/01/2017  ? ?Lab Results  ?Component Value Date  ? TSH 0.763 07/01/2017  ? ?Lab Results  ?Component Value Date  ? HGBA1C 6.6 (H) 04/03/2021  ? ?Lab Results  ?Component Value Date  ? WBC 9.0 04/12/2021  ? HGB 12.2 04/12/2021  ? HCT 38.6 04/12/2021  ? MCV 75.4 (L) 04/12/2021  ? PLT 322 04/12/2021  ? ?Lab Results  ?Component Value Date  ? ALT 13 04/12/2021  ? AST 22 04/12/2021  ? ALKPHOS 74 04/12/2021  ? BILITOT 0.4  04/12/2021  ? ?Lab Results  ?Component Value Date  ? VD25OH 31.1 06/24/2016  ?  ? ?Review of Systems  ?Constitutional: Negative.  Negative for chills, fatigue, fever and unexpected weight change.  ?HENT:  Negative for congestion, ear discharge, ear pain, rhinorrhea, sinus pressure, sneezing and sore throat.   ?Respiratory:  Negative for cough, shortness of breath, wheezing and stridor.   ?Gastrointestinal:  Negative for abdominal pain, blood in stool, constipation, diarrhea and nausea.  ?Genitourinary:  Negative for dysuria, flank pain, frequency, hematuria, urgency and vaginal discharge.  ?Musculoskeletal:  Negative for arthralgias, back pain and myalgias.  ?Skin:  Negative for rash.  ?Neurological:  Negative for dizziness, weakness and headaches.  ?Hematological:  Negative for adenopathy. Does not bruise/bleed easily.  ?Psychiatric/Behavioral:  Negative for dysphoric mood. The patient is not nervous/anxious.   ? ?Patient Active Problem List  ? Diagnosis Date Noted  ? Gastroesophageal reflux disease 04/03/2021  ? Diabetes mellitus due to underlying condition, uncontrolled, with hyperglycemia ([place]) 04/03/2021  ? Constipation 02/13/2021  ? S/P resection of meningioma 01/23/2021  ? Meningioma ([place]) 01/14/2021  ? Statin myopathy 03/09/2020  ? Statin intolerance 09/23/2019  ? Multinodular goiter 04/16/2018  ? Mixed diabetic hyperlipidemia associated with type 2 diabetes mellitus ([place]) 11/10/2017  ? Tobacco dependence 11/10/2017  ? Microcytosis 11/17/2016  ? Depression, major, single episode, complete remission ([place]) 11/17/2016  ? Insomnia 07/22/2016  ? Hyperlipidemia 07/22/2016  ? Type 2 diabetes mellitus with hyperglycemia, without long-term current use of  insulin ([place]) 06/24/2016  ? Dental abscess 08/27/2015  ? Smoker 11/04/2013  ? Goiter 01/26/2012  ? Essential hypertension 12/17/2009  ? ? ?Allergies  ?Allergen Reactions  ? Atorvastatin Other (See Comments)  ?  Muscle pain  ? Pravastatin Sodium Nausea Only  ? ? ?Past  Surgical History:  ?Procedure Laterality Date  ? ABDOMINAL HYSTERECTOMY    ? CRANIOTOMY Left 01/14/2021  ? Procedure: Craniotomy - left - Frontal - Tumor;  Surgeon: Earnie Larsson, MD;  Location: [facility];  Service: Neurosurgery;  Laterality: Left;  ? HYSTEROTOMY    ? ? ?Social History  ? ?Tobacco Use  ? Smoking status: Former  ?  Packs/day: 0.50  ?  Types: Cigarettes  ?  Quit date: 07/31/2020  ?  Years since quitting: 1.0  ? Smokeless tobacco: Never  ?Vaping Use  ? Vaping Use: Never used  ?Substance Use Topics  ? Alcohol use: No  ? Drug use: No  ? ? ? ?Medication list has been reviewed and updated. ? ?Current Meds  ?Medication Sig  ? ACCU-CHEK AVIVA PLUS test strip 1 each daily.  ? Accu-Chek Softclix Lancets lancets   ? acetaminophen (TYLENOL) 500 MG tablet Take 500 mg by mouth every 6 (six) hours as needed (pain).  ? ferrous sulfate 325 (65 FE) MG tablet Take 1 tablet (325 mg total) by mouth daily with breakfast.  ? latanoprost (XALATAN) 0.005 % ophthalmic solution Place 1 drop into both eyes at bedtime.  ? lisinopril (ZESTRIL) 40 MG tablet TAKE 1 TABLET BY MOUTH DAILY  ? melatonin 3 MG TABS tablet Take 1 tablet (3 mg total) by mouth at bedtime.  ? metFORMIN (GLUCOPHAGE) 1000 MG tablet Take 0.5 tablets (500 mg total) by mouth 2 (two) times daily.  ? methimazole (TAPAZOLE) 5 MG tablet Take 0.5 tablets (2.5 mg total) by mouth in the morning.  ? metoprolol succinate (TOPROL-XL) 50 MG 24 hr tablet Take 1 tablet (50 mg total) by mouth daily. Take with or immediately following a meal.  ? Multiple Vitamin (MULTIVITAMIN WITH MINERALS) TABS tablet Take 1 tablet by mouth daily.  ? pantoprazole (PROTONIX) 40 MG tablet Take 1 tablet (40 mg total) by mouth at bedtime.  ? pioglitazone (ACTOS) 15 MG tablet Take 1 tablet (15 mg total) by mouth daily.  ? traZODone (DESYREL) 50 MG tablet Take 0.5 tablets (25 mg total) by mouth at bedtime.  ? [DISCONTINUED] methylphenidate (RITALIN) 5 MG tablet Take 1 tablet (5 mg total) by mouth 2 (two)  times daily with breakfast and lunch.  ? ? ? ?  08/01/2021  ? 10:46 AM 04/03/2021  ? 10:40 AM 07/26/2020  ?  1:27 PM 02/09/2020  ?  1:25 PM  ?GAD 7 : Generalized Anxiety Score  ?Nervous, Anxious, on Edge 0 0 0 0  ?Control/stop worrying 0 0 0 0  ?Worry too much - different things 0 0 0 0  ?Trouble relaxing 0 0 1 0  ?Restless 0 0 2 0  ?Easily annoyed or irritable 0 0 2 0  ?Afraid - awful might happen 0 0 2 0  ?Total GAD 7 Score 0 0 7 0  ?Anxiety Difficulty Not difficult at all Not difficult at all Not difficult at all   ? ? ? ?  08/01/2021  ? 10:46 AM  ?Depression screen PHQ 2/9  ?Decreased Interest 0  ?Down, Depressed, Hopeless 0  ?PHQ - 2 Score 0  ?Altered sleeping 0  ?Tired, decreased energy 0  ?Change in appetite 0  ?Feeling  bad or failure about yourself  0  ?Trouble concentrating 0  ?Moving slowly or fidgety/restless 0  ?Suicidal thoughts 0  ?PHQ-9 Score 0  ?Difficult doing work/chores Not difficult at all  ? ? ?BP Readings from Last 3 Encounters:  ?08/01/21 120/80  ?04/12/21 (!) 151/73  ?04/03/21 (!) 144/94  ? ? ?Physical Exam ?Vitals and nursing note reviewed.  ?Constitutional:   ?   Appearance: She is well-developed.  ?HENT:  ?   Head: Normocephalic.  ?   Right Ear: Tympanic membrane and external ear normal. There is no impacted cerumen.  ?   Left Ear: Tympanic membrane and external ear normal. There is no impacted cerumen.  ?   Nose: Nose normal. No congestion or rhinorrhea.  ?Eyes:  ?   General: Lids are everted, no foreign bodies appreciated. No scleral icterus.    ?   Left eye: No foreign body or hordeolum.  ?   Conjunctiva/sclera: Conjunctivae normal.  ?   Right eye: Right conjunctiva is not injected.  ?   Left eye: Left conjunctiva is not injected.  ?   Pupils: Pupils are equal, round, and reactive to light.  ?Neck:  ?   Thyroid: No thyromegaly.  ?   Vascular: No JVD.  ?   Trachea: No tracheal deviation.  ?Cardiovascular:  ?   Rate and Rhythm: Normal rate and regular rhythm.  ?   Heart sounds: Normal heart  sounds. No murmur heard. ?  No friction rub. No gallop.  ?Pulmonary:  ?   Effort: Pulmonary effort is normal. No respiratory distress.  ?   Breath sounds: Normal breath sounds. No wheezing, rhonchi or rales

## 2021-08-02 LAB — HEMOGLOBIN A1C
Est. average glucose Bld gHb Est-mCnc: 146 mg/dL
Hgb A1c MFr Bld: 6.7 % — ABNORMAL HIGH (ref 4.8–5.6)

## 2021-08-02 LAB — LIPID PANEL WITH LDL/HDL RATIO
Cholesterol, Total: 267 mg/dL — ABNORMAL HIGH (ref 100–199)
HDL: 58 mg/dL (ref >39)
LDL Chol Calc (NIH): 177 mg/dL — ABNORMAL HIGH (ref 0–99)
LDL/HDL Ratio: 3.1 ratio (ref 0.0–3.2)
Triglycerides: 174 mg/dL — ABNORMAL HIGH (ref 0–149)
VLDL Cholesterol Cal: 32 mg/dL (ref 5–40)

## 2021-08-02 LAB — MICROALBUMIN, URINE: Microalbumin, Urine: 7.5 ug/mL

## 2021-08-05 ENCOUNTER — Other Ambulatory Visit: Payer: Self-pay

## 2021-08-05 DIAGNOSIS — E785 Hyperlipidemia, unspecified: Secondary | ICD-10-CM

## 2021-08-05 MED ORDER — EZETIMIBE 10 MG PO TABS: 10.0000 mg | ORAL_TABLET | Freq: Every day | ORAL | 1 refills | Status: DC

## 2021-08-05 NOTE — Progress Notes (Unsigned)
Sent in zetia 

## 2021-08-12 ENCOUNTER — Ambulatory Visit (INDEPENDENT_AMBULATORY_CARE_PROVIDER_SITE_OTHER): Payer: Medicare Other

## 2021-08-12 DIAGNOSIS — Z0000 Encounter for general adult medical examination without abnormal findings: Secondary | ICD-10-CM

## 2021-08-12 NOTE — Patient Instructions (Signed)
Ann Chaney , Thank you for taking time to come for your Medicare Wellness Visit. I appreciate your ongoing commitment to your health goals. Please review the following plan we discussed and let me know if I can assist you in the future.   Screening recommendations/referrals: Colonoscopy: FOBT done 06/14/21. Repeat yearly Mammogram: done 09/20/20 Bone Density: done 09/20/20 Recommended yearly ophthalmology/optometry visit for glaucoma screening and checkup Recommended yearly dental visit for hygiene and checkup  Vaccinations: Influenza vaccine: done 11/28/20 Pneumococcal vaccine: done 10/24/14 Tdap vaccine: due Shingles vaccine: Shingrix discussed. Please contact your pharmacy for coverage information.  Covid-19:done 05/17/19 & 06/07/19  Advanced directives: Please bring a copy of your health care power of attorney and living will to the office at your convenience.   Conditions/risks identified: Recommend increasing physical activity as tolerated  Next appointment: Follow up in one year for your annual wellness visit    Preventive Care 65 Years and Older, Female Preventive care refers to lifestyle choices and visits with your health care provider that can promote health and wellness. What does preventive care include? A yearly physical exam. This is also called an annual well check. Dental exams once or twice a year. Routine eye exams. Ask your health care provider how often you should have your eyes checked. Personal lifestyle choices, including: Daily care of your teeth and gums. Regular physical activity. Eating a healthy diet. Avoiding tobacco and drug use. Limiting alcohol use. Practicing safe sex. Taking low-dose aspirin every day. Taking vitamin and mineral supplements as recommended by your health care provider. What happens during an annual well check? The services and screenings done by your health care provider during your annual well check will depend on your age, overall  health, lifestyle risk factors, and family history of disease. Counseling  Your health care provider may ask you questions about your: Alcohol use. Tobacco use. Drug use. Emotional well-being. Home and relationship well-being. Sexual activity. Eating habits. History of falls. Memory and ability to understand (cognition). Work and work Statistician. Reproductive health. Screening  You may have the following tests or measurements: Height, weight, and BMI. Blood pressure. Lipid and cholesterol levels. These may be checked every 5 years, or more frequently if you are over 28 years old. Skin check. Lung cancer screening. You may have this screening every year starting at age 32 if you have a 30-pack-year history of smoking and currently smoke or have quit within the past 15 years. Fecal occult blood test (FOBT) of the stool. You may have this test every year starting at age 11. Flexible sigmoidoscopy or colonoscopy. You may have a sigmoidoscopy every 5 years or a colonoscopy every 10 years starting at age 49. Hepatitis C blood test. Hepatitis B blood test. Sexually transmitted disease (STD) testing. Diabetes screening. This is done by checking your blood sugar (glucose) after you have not eaten for a while (fasting). You may have this done every 1-3 years. Bone density scan. This is done to screen for osteoporosis. You may have this done starting at age 22. Mammogram. This may be done every 1-2 years. Talk to your health care provider about how often you should have regular mammograms. Talk with your health care provider about your test results, treatment options, and if necessary, the need for more tests. Vaccines  Your health care provider may recommend certain vaccines, such as: Influenza vaccine. This is recommended every year. Tetanus, diphtheria, and acellular pertussis (Tdap, Td) vaccine. You may need a Td booster every 10 years. Zoster  vaccine. You may need this after age  50. Pneumococcal 13-valent conjugate (PCV13) vaccine. One dose is recommended after age 83. Pneumococcal polysaccharide (PPSV23) vaccine. One dose is recommended after age 54. Talk to your health care provider about which screenings and vaccines you need and how often you need them. This information is not intended to replace advice given to you by your health care provider. Make sure you discuss any questions you have with your health care provider. Document Released: 04/06/2015 Document Revised: 11/28/2015 Document Reviewed: 01/09/2015 Elsevier Interactive Patient Education  2017 [place] Prevention in the Home Falls can cause injuries. They can happen to people of all ages. There are many things you can do to make your home safe and to help prevent falls. What can I do on the outside of my home? Regularly fix the edges of walkways and driveways and fix any cracks. Remove anything that might make you trip as you walk through a door, such as a raised step or threshold. Trim any bushes or trees on the path to your home. Use bright outdoor lighting. Clear any walking paths of anything that might make someone trip, such as rocks or tools. Regularly check to see if handrails are loose or broken. Make sure that both sides of any steps have handrails. Any raised decks and porches should have guardrails on the edges. Have any leaves, snow, or ice cleared regularly. Use sand or salt on walking paths during winter. Clean up any spills in your garage right away. This includes oil or grease spills. What can I do in the bathroom? Use night lights. Install grab bars by the toilet and in the tub and shower. Do not use towel bars as grab bars. Use non-skid mats or decals in the tub or shower. If you need to sit down in the shower, use a plastic, non-slip stool. Keep the floor dry. Clean up any water that spills on the floor as soon as it happens. Remove soap buildup in the tub or shower  regularly. Attach bath mats securely with double-sided non-slip rug tape. Do not have throw rugs and other things on the floor that can make you trip. What can I do in the bedroom? Use night lights. Make sure that you have a light by your bed that is easy to reach. Do not use any sheets or blankets that are too big for your bed. They should not hang down onto the floor. Have a firm chair that has side arms. You can use this for support while you get dressed. Do not have throw rugs and other things on the floor that can make you trip. What can I do in the kitchen? Clean up any spills right away. Avoid walking on wet floors. Keep items that you use a lot in easy-to-reach places. If you need to reach something above you, use a strong step stool that has a grab bar. Keep electrical cords out of the way. Do not use floor polish or wax that makes floors slippery. If you must use wax, use non-skid floor wax. Do not have throw rugs and other things on the floor that can make you trip. What can I do with my stairs? Do not leave any items on the stairs. Make sure that there are handrails on both sides of the stairs and use them. Fix handrails that are broken or loose. Make sure that handrails are as long as the stairways. Check any carpeting to make sure that it  is firmly attached to the stairs. Fix any carpet that is loose or worn. Avoid having throw rugs at the top or bottom of the stairs. If you do have throw rugs, attach them to the floor with carpet tape. Make sure that you have a light switch at the top of the stairs and the bottom of the stairs. If you do not have them, ask someone to add them for you. What else can I do to help prevent falls? Wear shoes that: Do not have high heels. Have rubber bottoms. Are comfortable and fit you well. Are closed at the toe. Do not wear sandals. If you use a stepladder: Make sure that it is fully opened. Do not climb a closed stepladder. Make sure that  both sides of the stepladder are locked into place. Ask someone to hold it for you, if possible. Clearly mark and make sure that you can see: Any grab bars or handrails. First and last steps. Where the edge of each step is. Use tools that help you move around (mobility aids) if they are needed. These include: Canes. Walkers. Scooters. Crutches. Turn on the lights when you go into a dark area. Replace any light bulbs as soon as they burn out. Set up your furniture so you have a clear path. Avoid moving your furniture around. If any of your floors are uneven, fix them. If there are any pets around you, be aware of where they are. Review your medicines with your doctor. Some medicines can make you feel dizzy. This can increase your chance of falling. Ask your doctor what other things that you can do to help prevent falls. This information is not intended to replace advice given to you by your health care provider. Make sure you discuss any questions you have with your health care provider. Document Released: 01/04/2009 Document Revised: 08/16/2015 Document Reviewed: 04/14/2014 Elsevier Interactive Patient Education  2017 [employer].

## 2021-08-12 NOTE — Progress Notes (Signed)
Subjective:   Ann Chaney is a 76 y.o. female who presents for Medicare Annual (Subsequent) preventive examination.  Virtual Visit via Telephone Note  I connected with  KARYN BRULL on 08/12/21 at 10:30 AM EDT by telephone and verified that I am speaking with the correct person using two identifiers.  Location: Patient: home Provider: [facility] Persons participating in the virtual visit: [facility]   I discussed the limitations, risks, security and privacy concerns of performing an evaluation and management service by telephone and the availability of in person appointments. The patient expressed understanding and agreed to proceed.  Interactive audio and video telecommunications were attempted between this nurse and patient, however failed, due to patient having technical difficulties OR patient did not have access to video capability.  We continued and completed visit with audio only.  Some vital signs may be absent or patient reported.   Clemetine Marker, LPN   Review of Systems     Cardiac Risk Factors include: advanced age (>54mn, >>66women);diabetes mellitus;dyslipidemia;hypertension     Objective:    There were no vitals filed for this visit. There is no height or weight on file to calculate BMI.     08/12/2021   10:39 AM 04/12/2021    8:01 PM 01/23/2021    1:52 PM 01/14/2021    2:15 PM 01/11/2021    8:32 AM 08/08/2020   11:03 AM 08/01/2019    2:44 PM  Advanced Directives  Does Patient Have a Medical Advance Directive? Yes No Yes Yes Yes No No  Type of [place] will  Healthcare Power of [place] will    Does patient want to make changes to medical advance directive?   No - Patient declined No - Patient declined No - Patient declined    Copy of [place] Chart? Yes - validated most recent copy scanned in chart (See row  information)  No - copy requested No - copy requested No - copy requested    Would patient like information on creating a medical advance directive?      Yes (MAU/Ambulatory/Procedural Areas - Information given) Yes (MAU/Ambulatory/Procedural Areas - Information given)    Current Medications (verified) Outpatient Encounter Medications as of 08/12/2021  Medication Sig   ACCU-CHEK AVIVA PLUS test strip 1 each daily.   Accu-Chek Softclix Lancets lancets    acetaminophen (TYLENOL) 500 MG tablet Take 500 mg by mouth every 6 (six) hours as needed (pain).   ezetimibe (ZETIA) 10 MG tablet Take 1 tablet (10 mg total) by mouth daily.   ferrous sulfate 325 (65 FE) MG tablet Take 1 tablet (325 mg total) by mouth daily with breakfast.   glipiZIDE (GLUCOTROL) 5 MG tablet Take 5 mg by mouth daily.   latanoprost (XALATAN) 0.005 % ophthalmic solution Place 1 drop into both eyes at bedtime.   lisinopril (ZESTRIL) 40 MG tablet TAKE 1 TABLET BY MOUTH DAILY   melatonin 3 MG TABS tablet Take 1 tablet (3 mg total) by mouth at bedtime.   metFORMIN (GLUCOPHAGE) 1000 MG tablet Take 1 tablet by mouth 2 (two) times daily with a meal.   methimazole (TAPAZOLE) 5 MG tablet Take 0.5 tablets (2.5 mg total) by mouth in the morning.   metoprolol succinate (TOPROL-XL) 50 MG 24 hr tablet Take 1 tablet (50 mg total) by mouth daily. Take with or immediately following a meal.   Multiple Vitamin (MULTIVITAMIN WITH  MINERALS) TABS tablet Take 1 tablet by mouth daily.   pantoprazole (PROTONIX) 40 MG tablet Take 1 tablet (40 mg total) by mouth at bedtime.   pioglitazone (ACTOS) 15 MG tablet Take 1 tablet (15 mg total) by mouth daily.   traZODone (DESYREL) 50 MG tablet Take 0.5 tablets (25 mg total) by mouth at bedtime.   [DISCONTINUED] metFORMIN (GLUCOPHAGE) 1000 MG tablet Take 0.5 tablets (500 mg total) by mouth 2 (two) times daily.   No facility-administered encounter medications on file as of 08/12/2021.    Allergies  (verified) Atorvastatin and Pravastatin sodium   History: Past Medical History:  Diagnosis Date   Diabetes mellitus without complication ([place])    Glaucoma    Glaucoma    Hyperlipidemia    Hypertension    Past Surgical History:  Procedure Laterality Date   ABDOMINAL HYSTERECTOMY     CRANIOTOMY Left 01/14/2021   Procedure: Craniotomy - left - Frontal - Tumor;  Surgeon: Earnie Larsson, MD;  Location: [facility];  Service: Neurosurgery;  Laterality: Left;   HYSTEROTOMY     Family History  Problem Relation Age of Onset   Cancer Mother        sternum   Emphysema Mother    Diabetes Mother    Glaucoma Father    Dementia Father    Breast cancer Maternal Aunt    Social History   Socioeconomic History   Marital status: Divorced    Spouse name: Not on file   Number of children: 1   Years of education: Not on file   Highest education level: Associate degree: academic program  Occupational History   Occupation: retired  Tobacco Use   Smoking status: Former    Packs/day: 0.50    Types: Cigarettes    Quit date: 07/31/2020    Years since quitting: 1.0   Smokeless tobacco: Never  Vaping Use   Vaping Use: Never used  Substance and Sexual Activity   Alcohol use: No   Drug use: No   Sexual activity: Not Currently  Other Topics Concern   Not on file  Social History Narrative   Not on file   Social Determinants of Health   Financial Resource Strain: Low Risk    Difficulty of Paying Living Expenses: Not hard at all  Food Insecurity: No Food Insecurity   Worried About Charity fundraiser in the Last Year: Never true   [place] in the Last Year: Never true  Transportation Needs: No Transportation Needs   Lack of Transportation (Medical): No   Lack of Transportation (Non-Medical): No  Physical Activity: Inactive   Days of Exercise per Week: 0 days   Minutes of Exercise per Session: 0 min  Stress: No Stress Concern Present   Feeling of Stress : Not at all  Social  Connections: Moderately Isolated   Frequency of Communication with Friends and Family: More than three times a week   Frequency of Social Gatherings with Friends and Family: More than three times a week   Attends Religious Services: More than 4 times per year   Active Member of [employer] or Organizations: No   Attends Music therapist: Never   Marital Status: Divorced    Tobacco Counseling Counseling given: Not Answered   Clinical Intake:  Pre-visit preparation completed: Yes  Pain : No/denies pain     Nutritional Risks: None Diabetes: Yes CBG done?: No Did pt. bring in CBG monitor from home?: No  How often do you  need to have someone help you when you read instructions, pamphlets, or other written materials from your doctor or pharmacy?: 1 - Never  Nutrition Risk Assessment:  Has the patient had any N/V/D within the last 2 months?  No  Does the patient have any non-healing wounds?  No  Has the patient had any unintentional weight loss or weight gain?  No   Diabetes:  Is the patient diabetic?  Yes  If diabetic, was a CBG obtained today?  No  Did the patient bring in their glucometer from home?  No  How often do you monitor your CBG's? Twice daily.   Financial Strains and Diabetes Management:  Are you having any financial strains with the device, your supplies or your medication? No .  Does the patient want to be seen by Chronic Care Management for management of their diabetes?  No  Would the patient like to be referred to a Nutritionist or for Diabetic Management?  No   Diabetic Exams:  Diabetic Eye Exam: Completed 10/01/20.   Diabetic Foot Exam: Completed 08/01/21.   Interpreter Needed?: No  Information entered by :: Clemetine Marker LPN   Activities of Daily Living    08/12/2021   10:40 AM 01/23/2021    1:56 PM  In your present state of health, do you have any difficulty performing the following activities:  Hearing? 0   Vision? 0   Difficulty  concentrating or making decisions? 0   Walking or climbing stairs? 0   Dressing or bathing? 0   Doing errands, shopping? 0 1  Preparing Food and eating ? N   Using the Toilet? N   In the past six months, have you accidently leaked urine? Y   Do you have problems with loss of bowel control? N   Managing your Medications? N   Managing your Finances? N   Housekeeping or managing your Housekeeping? N     Patient Care Team: Juline Patch, MD as PCP - General (Family Medicine)  Indicate any recent Medical Services you may have received from other than [facility] providers in the past year (date may be approximate).     Assessment:   This is a routine wellness examination for Tyara.  Hearing/Vision screen Hearing Screening - Comments:: Pt denies hearing difficulty  Vision Screening - Comments:: Annual vision screenings done by Dr. Ellin Mayhew  Dietary issues and exercise activities discussed: Current Exercise Habits: The patient does not participate in regular exercise at present, Exercise limited by: None identified   Goals Addressed   None    Depression Screen    08/12/2021   10:38 AM 08/01/2021   10:46 AM 04/03/2021   10:38 AM 08/08/2020   11:02 AM 07/26/2020    1:25 PM 04/05/2020    1:46 PM 02/09/2020    1:25 PM  PHQ 2/9 Scores  PHQ - 2 Score 0 0 1 0 1 0 0  PHQ- 9 Score  0 2  6 0 0    Fall Risk    08/12/2021   10:39 AM 04/03/2021   10:40 AM 08/08/2020   11:04 AM 04/05/2020    1:46 PM 02/09/2020    1:25 PM  Fall Risk   Falls in the past year? 0 0 0 0 0  Number falls in past yr: 0 0 0    Injury with Fall? 0 0 0    Risk for fall due to : No Fall Risks Impaired balance/gait No Fall Risks    Follow up  Falls prevention discussed Falls evaluation completed;Falls prevention discussed Falls prevention discussed Falls evaluation completed Falls evaluation completed    FALL RISK PREVENTION PERTAINING TO THE HOME:  Any stairs in or around the home? Yes  If so, are there any without  handrails? Yes  - 1 step outside Home free of loose throw rugs in walkways, pet beds, electrical cords, etc? Yes  Adequate lighting in your home to reduce risk of falls? Yes   ASSISTIVE DEVICES UTILIZED TO PREVENT FALLS:  Life alert? No  Use of a cane, walker or w/c? No  Grab bars in the bathroom? No  Shower chair or bench in shower? No  Elevated toilet seat or a handicapped toilet? Yes   TIMED UP AND GO:  Was the test performed? No . Telephonic visit.   Cognitive Function: Normal cognitive status assessed by direct observation by this Nurse Health Advisor. No abnormalities found.          07/16/2020    4:15 PM 08/01/2019    2:47 PM 03/29/2018    3:12 PM  6CIT Screen  What Year? 0 points 0 points 0 points  What month? 0 points 0 points 0 points  What time? 0 points 0 points 0 points  Count back from 20 4 points 0 points 0 points  Months in reverse 4 points 0 points 2 points  Repeat phrase 0 points 0 points 0 points  Total Score 8 points 0 points 2 points    Immunizations Immunization History  Administered Date(s) Administered   Fluad Quad(high Dose 65+) 02/24/2019, 02/09/2020, 11/28/2020   Influenza, High Dose Seasonal PF 12/30/2016, 03/29/2018   Influenza, Seasonal, Injecte, Preservative Fre 12/11/2011   Influenza,inj,Quad PF,6+ Mos 12/13/2013, 02/01/2015, 01/24/2016   PFIZER(Purple Top)SARS-COV-2 Vaccination 05/17/2019, 06/07/2019   Pneumococcal Conjugate-13 10/07/2013   Pneumococcal Polysaccharide-23 10/24/2014   Tdap 09/11/2009    TDAP status: Due, Education has been provided regarding the importance of this vaccine. Advised may receive this vaccine at local pharmacy or Health Dept. Aware to provide a copy of the vaccination record if obtained from local pharmacy or Health Dept. Verbalized acceptance and understanding.  Flu Vaccine status: Up to date  Pneumococcal vaccine status: Up to date  Covid-19 vaccine status: Completed vaccines  Qualifies for Shingles  Vaccine? Yes   Zostavax completed No   Shingrix Completed?: No.    Education has been provided regarding the importance of this vaccine. Patient has been advised to call insurance company to determine out of pocket expense if they have not yet received this vaccine. Advised may also receive vaccine at local pharmacy or Health Dept. Verbalized acceptance and understanding.  Screening Tests Health Maintenance  Topic Date Due   COVID-19 Vaccine (3 - Pfizer risk series) 08/17/2021 (Originally 07/05/2019)   Zoster Vaccines- Shingrix (1 of 2) 11/01/2021 (Originally 12/09/1964)   TETANUS/TDAP  08/02/2022 (Originally 09/12/2019)   OPHTHALMOLOGY EXAM  10/01/2021   INFLUENZA VACCINE  10/22/2021   HEMOGLOBIN A1C  02/01/2022   COLON CANCER SCREENING ANNUAL FOBT  06/15/2022   FOOT EXAM  08/02/2022   MAMMOGRAM  09/21/2022   COLONOSCOPY (Pts 45-19yr Insurance coverage will need to be confirmed)  03/24/2026   Pneumonia Vaccine 75 Years old  Completed   DEXA SCAN  Completed   Hepatitis C Screening  Completed   HPV VACCINES  Aged Out    Health Maintenance  There are no preventive care reminders to display for this patient.  Colorectal cancer screening: Type of screening: FOBT/FIT. Completed 06/14/21. Repeat  every 1 years  Mammogram status: Completed 09/20/20. Repeat every year  Bone Density status: Completed 09/20/20. Results reflect: Bone density results: OSTEOPOROSIS. Repeat every 2 years.  Lung Cancer Screening: (Low Dose CT Chest recommended if Age 68-80 years, 30 pack-year currently smoking OR have quit w/in 15years.) does not qualify.   Additional Screening:  Hepatitis C Screening: does qualify; Completed 12/19/20  Vision Screening: Recommended annual ophthalmology exams for early detection of glaucoma and other disorders of the eye. Is the patient up to date with their annual eye exam?  Yes  Who is the provider or what is the name of the office in which the patient attends annual eye exams?  [facility].   Dental Screening: Recommended annual dental exams for proper oral hygiene  Community Resource Referral / Chronic Care Management: CRR required this visit?  No   CCM required this visit?  No      Plan:     I have personally reviewed and noted the following in the patient's chart:   Medical and social history Use of alcohol, tobacco or illicit drugs  Current medications and supplements including opioid prescriptions.  Functional ability and status Nutritional status Physical activity Advanced directives List of other physicians Hospitalizations, surgeries, and ER visits in previous 12 months Vitals Screenings to include cognitive, depression, and falls Referrals and appointments  In addition, I have reviewed and discussed with patient certain preventive protocols, quality metrics, and best practice recommendations. A written personalized care plan for preventive services as well as general preventive health recommendations were provided to patient.     Clemetine Marker, LPN   [id number]   Nurse Notes: none

## 2021-08-21 ENCOUNTER — Encounter: Payer: Self-pay | Admitting: Family Medicine

## 2021-08-21 ENCOUNTER — Ambulatory Visit (INDEPENDENT_AMBULATORY_CARE_PROVIDER_SITE_OTHER): Payer: Medicare Other | Admitting: Family Medicine

## 2021-08-21 VITALS — BP 130/78 | HR 68 | Ht 61.0 in | Wt 127.0 lb

## 2021-08-21 DIAGNOSIS — D649 Anemia, unspecified: Secondary | ICD-10-CM | POA: Diagnosis not present

## 2021-08-21 DIAGNOSIS — E876 Hypokalemia: Secondary | ICD-10-CM

## 2021-08-21 DIAGNOSIS — Z1231 Encounter for screening mammogram for malignant neoplasm of breast: Secondary | ICD-10-CM

## 2021-08-21 DIAGNOSIS — Z0000 Encounter for general adult medical examination without abnormal findings: Secondary | ICD-10-CM | POA: Diagnosis not present

## 2021-08-21 NOTE — Progress Notes (Signed)
Date:  08/21/2021   Name:  ANNALEISE BURGER   DOB:  Dec 11, 1945   MRN:  [id number]   Chief Complaint: Annual Exam  Patient is a 76 year old female who presents for a comprehensive physical exam. The patient reports the following problems: none. Health maintenance has been reviewed up to date.CHIVON LEPAGE is a 76 y.o. female who presents today for her Complete Annual Exam. She feels well. She reports exercising nope. She reports she is sleeping fairly well.       Lab Results  Component Value Date   NA 138 04/12/2021   K 3.4 (L) 04/12/2021   CO2 25 04/12/2021   GLUCOSE 190 (H) 04/12/2021   BUN 11 04/12/2021   CREATININE 0.81 04/12/2021   CALCIUM 9.5 04/12/2021   EGFR 73 11/28/2020   GFRNONAA >60 04/12/2021   Lab Results  Component Value Date   CHOL 267 (H) 08/01/2021   HDL 58 08/01/2021   LDLCALC 177 (H) 08/01/2021   TRIG 174 (H) 08/01/2021   CHOLHDL 6.0 (H) 10/01/2017   Lab Results  Component Value Date   TSH 0.763 07/01/2017   Lab Results  Component Value Date   HGBA1C 6.7 (H) 08/01/2021   Lab Results  Component Value Date   WBC 9.0 04/12/2021   HGB 12.2 04/12/2021   HCT 38.6 04/12/2021   MCV 75.4 (L) 04/12/2021   PLT 322 04/12/2021   Lab Results  Component Value Date   ALT 13 04/12/2021   AST 22 04/12/2021   ALKPHOS 74 04/12/2021   BILITOT 0.4 04/12/2021   Lab Results  Component Value Date   VD25OH 31.1 06/24/2016     Review of Systems  Constitutional: Negative.  Negative for chills, fatigue, fever and unexpected weight change.  HENT:  Negative for congestion, ear discharge, ear pain, rhinorrhea, sinus pressure, sneezing and sore throat.   Respiratory:  Negative for cough, shortness of breath, wheezing and stridor.   Gastrointestinal:  Negative for abdominal pain, blood in stool, constipation, diarrhea and nausea.  Genitourinary:  Negative for dysuria, flank pain, frequency, hematuria, urgency and vaginal discharge.  Musculoskeletal:  Negative for  arthralgias, back pain and myalgias.  Skin:  Negative for rash.  Neurological:  Negative for dizziness, weakness and headaches.  Hematological:  Negative for adenopathy. Does not bruise/bleed easily.  Psychiatric/Behavioral:  Negative for dysphoric mood. The patient is not nervous/anxious.    Patient Active Problem List   Diagnosis Date Noted   Gastroesophageal reflux disease 04/03/2021   Diabetes mellitus due to underlying condition, uncontrolled, with hyperglycemia ([place]) 04/03/2021   Constipation 02/13/2021   S/P resection of meningioma 01/23/2021   Meningioma ([place]) 01/14/2021   Statin myopathy 03/09/2020   Statin intolerance 09/23/2019   Multinodular goiter 04/16/2018   Mixed diabetic hyperlipidemia associated with type 2 diabetes mellitus ([place]) 11/10/2017   Tobacco dependence 11/10/2017   Microcytosis 11/17/2016   Depression, major, single episode, complete remission ([place]) 11/17/2016   Insomnia 07/22/2016   Hyperlipidemia 07/22/2016   Type 2 diabetes mellitus with hyperglycemia, without long-term current use of insulin ([place]) 06/24/2016   Dental abscess 08/27/2015   Smoker 11/04/2013   Goiter 01/26/2012   Essential hypertension 12/17/2009    Allergies  Allergen Reactions   Atorvastatin Other (See Comments)    Muscle pain   Pravastatin Sodium Nausea Only    Past Surgical History:  Procedure Laterality Date   ABDOMINAL HYSTERECTOMY     CRANIOTOMY Left 01/14/2021   Procedure: Craniotomy - left -  Frontal - Tumor;  Surgeon: Earnie Larsson, MD;  Location: [facility];  Service: Neurosurgery;  Laterality: Left;   HYSTEROTOMY      Social History   Tobacco Use   Smoking status: Former    Packs/day: 0.50    Types: Cigarettes    Quit date: 07/31/2020    Years since quitting: 1.0   Smokeless tobacco: Never  Vaping Use   Vaping Use: Never used  Substance Use Topics   Alcohol use: No   Drug use: No     Medication list has been reviewed and updated.  Current Meds  Medication  Sig   ACCU-CHEK AVIVA PLUS test strip 1 each daily.   Accu-Chek Softclix Lancets lancets    acetaminophen (TYLENOL) 500 MG tablet Take 500 mg by mouth every 6 (six) hours as needed (pain).   ezetimibe (ZETIA) 10 MG tablet Take 1 tablet (10 mg total) by mouth daily.   ferrous sulfate 325 (65 FE) MG tablet Take 1 tablet (325 mg total) by mouth daily with breakfast.   glipiZIDE (GLUCOTROL) 5 MG tablet Take 5 mg by mouth daily.   latanoprost (XALATAN) 0.005 % ophthalmic solution Place 1 drop into both eyes at bedtime.   lisinopril (ZESTRIL) 40 MG tablet TAKE 1 TABLET BY MOUTH DAILY   melatonin 3 MG TABS tablet Take 1 tablet (3 mg total) by mouth at bedtime.   metFORMIN (GLUCOPHAGE) 1000 MG tablet Take 1 tablet by mouth 2 (two) times daily with a meal.   methimazole (TAPAZOLE) 5 MG tablet Take 0.5 tablets (2.5 mg total) by mouth in the morning.   metoprolol succinate (TOPROL-XL) 50 MG 24 hr tablet Take 1 tablet (50 mg total) by mouth daily. Take with or immediately following a meal.   Multiple Vitamin (MULTIVITAMIN WITH MINERALS) TABS tablet Take 1 tablet by mouth daily.   pantoprazole (PROTONIX) 40 MG tablet Take 1 tablet (40 mg total) by mouth at bedtime.   pioglitazone (ACTOS) 15 MG tablet Take 1 tablet (15 mg total) by mouth daily.   traZODone (DESYREL) 50 MG tablet Take 0.5 tablets (25 mg total) by mouth at bedtime.       08/21/2021   10:54 AM 08/01/2021   10:46 AM 04/03/2021   10:40 AM 07/26/2020    1:27 PM  GAD 7 : Generalized Anxiety Score  Nervous, Anxious, on Edge 0 0 0 0  Control/stop worrying 0 0 0 0  Worry too much - different things 0 0 0 0  Trouble relaxing 0 0 0 1  Restless 0 0 0 2  Easily annoyed or irritable 0 0 0 2  Afraid - awful might happen 0 0 0 2  Total GAD 7 Score 0 0 0 7  Anxiety Difficulty Not difficult at all Not difficult at all Not difficult at all Not difficult at all       08/21/2021   10:53 AM  Depression screen PHQ 2/9  Decreased Interest 0  Down,  Depressed, Hopeless 0  PHQ - 2 Score 0  Altered sleeping 0  Tired, decreased energy 0  Change in appetite 0  Feeling bad or failure about yourself  0  Trouble concentrating 0  Moving slowly or fidgety/restless 0  Suicidal thoughts 0  PHQ-9 Score 0  Difficult doing work/chores Not difficult at all    BP Readings from Last 3 Encounters:  08/21/21 130/78  08/01/21 120/80  04/12/21 (!) 151/73    Physical Exam Vitals and nursing note reviewed.  Constitutional:  Appearance: Normal appearance. She is well-developed and normal weight.  HENT:     Head: Normocephalic.     Jaw: There is normal jaw occlusion.     Right Ear: Tympanic membrane and external ear normal.     Left Ear: Tympanic membrane and external ear normal.     Nose: Nose normal.     Right Turbinates: Not swollen.     Left Turbinates: Not swollen.     Right Sinus: No maxillary sinus tenderness or frontal sinus tenderness.     Left Sinus: No maxillary sinus tenderness or frontal sinus tenderness.     Mouth/Throat:     Lips: Pink.     Mouth: Mucous membranes are moist.     Dentition: Normal dentition.     Pharynx: Oropharynx is clear. Uvula midline.  Eyes:     General: Lids are normal. Vision grossly intact. Gaze aligned appropriately. No scleral icterus.       Left eye: No foreign body or hordeolum.     Extraocular Movements: Extraocular movements intact.     Conjunctiva/sclera: Conjunctivae normal.     Right eye: Right conjunctiva is not injected.     Left eye: Left conjunctiva is not injected.     Pupils: Pupils are equal, round, and reactive to light.     Funduscopic exam:    Right eye: Red reflex present.        Left eye: Red reflex present. Neck:     Thyroid: No thyroid mass, thyromegaly or thyroid tenderness.     Vascular: Normal carotid pulses. No carotid bruit, hepatojugular reflux or JVD.     Trachea: Trachea and phonation normal. No tracheal deviation.  Cardiovascular:     Rate and Rhythm: Normal  rate and regular rhythm.     Pulses: Normal pulses.          Carotid pulses are 2+ on the right side and 2+ on the left side.      Radial pulses are 2+ on the right side and 2+ on the left side.       Femoral pulses are 2+ on the right side and 2+ on the left side.      Popliteal pulses are 2+ on the right side and 2+ on the left side.       Dorsalis pedis pulses are 2+ on the right side and 2+ on the left side.       Posterior tibial pulses are 2+ on the right side and 2+ on the left side.     Heart sounds: Normal heart sounds, S1 normal and S2 normal. No murmur heard. No systolic murmur is present.  No diastolic murmur is present.    No friction rub. No gallop. No S3 or S4 sounds.  Pulmonary:     Effort: Pulmonary effort is normal. No respiratory distress.     Breath sounds: Normal breath sounds. No decreased breath sounds, wheezing, rhonchi or rales.  Chest:  Breasts:    Right: Normal. No swelling, bleeding, inverted nipple, mass, nipple discharge, skin change or tenderness.     Left: Normal. No swelling, bleeding, inverted nipple, mass, nipple discharge, skin change or tenderness.  Abdominal:     General: Bowel sounds are normal.     Palpations: Abdomen is soft. There is no hepatomegaly, splenomegaly or mass.     Tenderness: There is no abdominal tenderness. There is no guarding or rebound.  Musculoskeletal:        General: No tenderness. Normal range  of motion.     Cervical back: Normal, normal range of motion and neck supple.     Thoracic back: Normal.     Lumbar back: Normal.  Lymphadenopathy:     Head:     Right side of head: No submental or submandibular adenopathy.     Left side of head: No submental or submandibular adenopathy.     Cervical: No cervical adenopathy.     Right cervical: No superficial, deep or posterior cervical adenopathy.    Left cervical: No superficial, deep or posterior cervical adenopathy.     Upper Body:     Right upper body: No supraclavicular or  axillary adenopathy.     Left upper body: No supraclavicular or axillary adenopathy.     Lower Body: No right inguinal adenopathy. No left inguinal adenopathy.  Skin:    General: Skin is warm.     Capillary Refill: Capillary refill takes less than 2 seconds.     Findings: No rash.  Neurological:     Mental Status: She is alert and oriented to person, place, and time.     Cranial Nerves: No cranial nerve deficit.     Sensory: Sensation is intact.     Deep Tendon Reflexes: Reflexes normal.  Psychiatric:        Mood and Affect: Mood is not anxious or depressed.        Behavior: Behavior is cooperative.    Wt Readings from Last 3 Encounters:  08/21/21 127 lb (57.6 kg)  08/01/21 126 lb (57.2 kg)  04/03/21 120 lb (54.4 kg)    BP 130/78   Pulse 68   Ht $R'5\' 1"'wB$  (1.549 m)   Wt 127 lb (57.6 kg)   BMI 24.00 kg/m   Assessment and Plan:  Patient's presents for annual physical exam.  Chart was reviewed for previous encounters most recent labs demonstrates an imaging in Care Everywhere.BECKEY POLKOWSKI is a 76 y.o. female who presents today for her Complete Annual Exam. She feels well. She reports exercising "nope". She reports she is sleeping fairly well.    1. Annual physical exam. Immunizations are reviewed and recommendations provided.   Age appropriate screening tests are discussed. Counseling given for risk factor reduction interventions.  No subjective/objective concerns noted during history and physical exam.  2. Hypokalemia Patient with history of hypokalemia noted 4 months ago we will check potassium mildly. - Potassium  3. Anemia, unspecified type Patient has history of anemia we will recheck hemoglobin only has. - Hemoglobin  4. Breast cancer screening by mammogram Discussed with patient and mammogram has been scheduled. - MM 3D SCREEN BREAST BILATERAL

## 2021-08-22 ENCOUNTER — Telehealth: Payer: Self-pay | Admitting: Family Medicine

## 2021-08-22 LAB — POTASSIUM: Potassium: 5.1 mmol/L (ref 3.5–5.2)

## 2021-08-22 LAB — HEMOGLOBIN: Hemoglobin: 12.9 g/dL (ref 11.1–15.9)

## 2021-08-22 NOTE — Telephone Encounter (Signed)
Copied from [place] [id number]. Topic: General - Other >> Aug 22, 2021  8:52 AM Alanda Slim E wrote: Reason for CRM: [employer] noticed that the pt has diabetes and might not be on a statin per the current ADA and ACCAHA cholestorale guidelines / they are askign Dr. Ronnald Ramp to reconsider therapy for this pt and prescribe a statin if appropriate / please advise / they will be sending a fa confirming these details

## 2021-09-23 ENCOUNTER — Ambulatory Visit
Admission: RE | Admit: 2021-09-23 | Discharge: 2021-09-23 | Disposition: A | Discharge status: Home / Self Care | Payer: Medicare Other | Source: Ambulatory Visit | Attending: Family Medicine | Admitting: Family Medicine

## 2021-09-23 DIAGNOSIS — Z1231 Encounter for screening mammogram for malignant neoplasm of breast: Secondary | ICD-10-CM | POA: Insufficient documentation

## 2021-10-11 ENCOUNTER — Other Ambulatory Visit: Payer: Self-pay | Admitting: Family Medicine

## 2021-10-11 DIAGNOSIS — I10 Essential (primary) hypertension: Secondary | ICD-10-CM

## 2021-10-31 ENCOUNTER — Other Ambulatory Visit: Payer: Self-pay | Admitting: Family Medicine

## 2021-10-31 MED ORDER — METFORMIN HCL 1000 MG PO TABS: 1000.0000 mg | ORAL_TABLET | Freq: Two times a day (BID) | ORAL | 0 refills | Status: DC

## 2021-10-31 NOTE — Telephone Encounter (Signed)
Requested medication (s) are due for refill today: historical medication  Requested medication (s) are on the active medication list: yes  Last refill:  tapazole- 02/07/21 #15 0 refills, metformin- 04/29/21  Future visit scheduled: yes  Notes to clinic:  not delegated per protocol. Historical medication. Do you want to refill Rxs?     Requested Prescriptions  Pending Prescriptions Disp Refills   methimazole (TAPAZOLE) 5 MG tablet 15 tablet 0    Sig: Take 0.5 tablets (2.5 mg total) by mouth in the morning.     Not Delegated - Endocrinology:  Hyperthyroid Agents Failed - 10/31/2021 12:18 PM      Failed - This refill cannot be delegated      Failed - TSH in normal range and within 180 days    TSH  Date Value Ref Range Status  07/01/2017 0.763 0.450 - 4.500 uIU/mL Final         Failed - T3 Total in normal range and within 180 days    No results found for: "T3TOTAL", "T3FREE"       Failed - T4 free in normal range and within 180 days    No results found for: "T4TOTAL", "FREET4", "PKT4"       Passed - Valid encounter within last 6 months    Recent Outpatient Visits           2 months ago Annual physical exam   [facility] Juline Patch, MD   3 months ago Diabetes mellitus due to underlying condition, uncontrolled, with hyperglycemia ([place])   [facility] Juline Patch, MD   7 months ago Essential hypertension   [facility] Juline Patch, MD   11 months ago Essential hypertension   [facility] Juline Patch, MD   1 year ago Cognitive change   [facility] Juline Patch, MD       Future Appointments             In 1 month Juline Patch, MD [facility]             metFORMIN (GLUCOPHAGE) 1000 MG tablet      Sig: Take 1 tablet (1,000 mg total) by mouth 2 (two) times daily with a meal.     Endocrinology:  Diabetes - Biguanides Failed - 10/31/2021 12:18 PM      Failed - B12 Level in normal range  and within 720 days    Vitamin B-12  Date Value Ref Range Status  09/29/2016 369 232 - 1,245 pg/mL Final         Passed - Cr in normal range and within 360 days    Creatinine, Ser  Date Value Ref Range Status  04/12/2021 0.81 0.44 - 1.00 mg/dL Final         Passed - HBA1C is between 0 and 7.9 and within 180 days    Hemoglobin A1C  Date Value Ref Range Status  05/08/2020 5.7  Final   Hgb A1c MFr Bld  Date Value Ref Range Status  08/01/2021 6.7 (H) 4.8 - 5.6 % Final    Comment:             Prediabetes: 5.7 - 6.4          Diabetes: >6.4          Glycemic control for adults with diabetes: <7.0          Passed - eGFR in normal range and within  360 days    GFR calc Af Amer  Date Value Ref Range Status  04/26/2019 89 >59 mL/min/1.73 Final   GFR, Estimated  Date Value Ref Range Status  04/12/2021 >60 >60 mL/min Final    Comment:    (NOTE) Calculated using the CKD-EPI Creatinine Equation (2021)    eGFR  Date Value Ref Range Status  11/28/2020 73 >59 mL/min/1.73 Final         Passed - Valid encounter within last 6 months    Recent Outpatient Visits           2 months ago Annual physical exam   [facility] Juline Patch, MD   3 months ago Diabetes mellitus due to underlying condition, uncontrolled, with hyperglycemia ([place])   [facility] Juline Patch, MD   7 months ago Essential hypertension   [facility] Juline Patch, MD   11 months ago Essential hypertension   [facility] Juline Patch, MD   1 year ago Cognitive change   [facility] Juline Patch, MD       Future Appointments             In 1 month Juline Patch, MD [facility]            Passed - CBC within normal limits and completed in the last 12 months    WBC  Date Value Ref Range Status  04/12/2021 9.0 4.0 - 10.5 K/uL Final   RBC  Date Value Ref Range Status  04/12/2021 5.12 (H) 3.87 - 5.11 MIL/uL Final    Hemoglobin  Date Value Ref Range Status  08/21/2021 12.9 11.1 - 15.9 g/dL Final   HCT  Date Value Ref Range Status  04/12/2021 38.6 36.0 - 46.0 % Final   Hematocrit  Date Value Ref Range Status  04/03/2021 41.3 34.0 - 46.6 % Final   MCHC  Date Value Ref Range Status  04/12/2021 31.6 30.0 - 36.0 g/dL Final   [facility]  Date Value Ref Range Status  04/12/2021 23.8 (L) 26.0 - 34.0 pg Final   MCV  Date Value Ref Range Status  04/12/2021 75.4 (L) 80.0 - 100.0 fL Final  04/03/2021 77 (L) 79 - 97 fL Final   No results found for: "PLTCOUNTKUC", "LABPLAT", "POCPLA" RDW  Date Value Ref Range Status  04/12/2021 15.1 11.5 - 15.5 % Final  04/03/2021 14.6 11.7 - 15.4 % Final

## 2021-10-31 NOTE — Telephone Encounter (Signed)
Medication Refill - Medication: methimazole (TAPAZOLE) 5 MG tablet, metFORMIN (GLUCOPHAGE) 1000 MG tablet  Has the patient contacted their pharmacy? No. No, more refills.  (Agent: If no, request that the patient contact the pharmacy for the refill. If patient does not wish to contact the pharmacy document the reason why and proceed with request.)   Preferred Pharmacy (with phone number or street name):  [facility] ([facility]) - [place], [place]  [address] [place] [address]  Phone: [phone] Fax: [phone]  Hours: Not open 24 hours   Has the patient been seen for an appointment in the last year OR does the patient have an upcoming appointment? Yes.    Agent: Please be advised that RX refills may take up to 3 business days. We ask that you follow-up with your pharmacy.

## 2021-11-29 ENCOUNTER — Encounter: Payer: Self-pay | Admitting: Pharmacist

## 2021-11-29 DIAGNOSIS — G720 Drug-induced myopathy: Secondary | ICD-10-CM

## 2021-11-29 NOTE — Progress Notes (Signed)
[facility])                                            [facility]ia Team                                        Statin Quality Measure Assessment    11/29/2021  Ann Chaney 1945/08/03 [id number]   Per review of chart and payor information, patient has a diagnosis of diabetes but is not currently filling a statin prescription.  This places patient into the SUPD (Statin Use In Patients with Diabetes) measure for CMS.    Patient has documented trials of atorvastatin  with reported muscle pain, but no corresponding CPT codes that would exclude patient from SUPD measure.  The 10-year ASCVD risk score (Arnett DK, et al., 2019) is: 64.4%   Values used to calculate the score:     Age: 13 years     Sex: Female     Is Non-Hispanic African American: Yes     Diabetic: Yes     Tobacco smoker: Yes     Systolic Blood Pressure: 287 mmHg     Is BP treated: Yes     HDL Cholesterol: 58 mg/dL     Total Cholesterol: 267 mg/dL 08/01/2021     Component Value Date/Time   CHOL 267 (H) 08/01/2021 1118   TRIG 174 (H) 08/01/2021 1118   HDL 58 08/01/2021 1118   CHOLHDL 6.0 (H) 10/01/2017 1522   LDLCALC 177 (H) 08/01/2021 1118    Please consider ONE of the following recommendations:  Initiate high intensity statin Atorvastatin '40mg'$  once daily, #90, 3 refills   Rosuvastatin '20mg'$  once daily, #90, 3 refills    Initiate moderate intensity          statin with reduced frequency if prior          statin intolerance 1x weekly, #13, 3 refills   2x weekly, #26, 3 refills   3x weekly, #39, 3 refills    Code for past statin intolerance or  other exclusions (required annually)  Provider Requirements: Associate code during an office visit or telehealth encounter  Drug Induced Myopathy G72.0   Myopathy, unspecified G72.9   Myositis, unspecified M60.9   Rhabdomyolysis M62.82   Cirrhosis of liver K74.69   Prediabetes R73.03   PCOS E28.2     Loretha Brasil, PharmD [facility] Clinical Pharmacist Office: [phone]

## 2021-12-02 ENCOUNTER — Ambulatory Visit: Payer: Medicare Other | Admitting: Family Medicine

## 2021-12-03 DIAGNOSIS — E113293 Type 2 diabetes mellitus with mild nonproliferative diabetic retinopathy without macular edema, bilateral: Secondary | ICD-10-CM | POA: Diagnosis not present

## 2021-12-03 DIAGNOSIS — H2513 Age-related nuclear cataract, bilateral: Secondary | ICD-10-CM | POA: Diagnosis not present

## 2021-12-03 DIAGNOSIS — H401132 Primary open-angle glaucoma, bilateral, moderate stage: Secondary | ICD-10-CM | POA: Diagnosis not present

## 2021-12-03 LAB — HM DIABETES EYE EXAM

## 2021-12-05 ENCOUNTER — Encounter: Payer: Self-pay | Admitting: Family Medicine

## 2021-12-05 ENCOUNTER — Ambulatory Visit (INDEPENDENT_AMBULATORY_CARE_PROVIDER_SITE_OTHER): Payer: Medicare Other | Admitting: Family Medicine

## 2021-12-05 VITALS — BP 116/64 | HR 66 | Ht 61.0 in | Wt 133.0 lb

## 2021-12-05 DIAGNOSIS — Z23 Encounter for immunization: Secondary | ICD-10-CM | POA: Diagnosis not present

## 2021-12-05 DIAGNOSIS — E0500 Thyrotoxicosis with diffuse goiter without thyrotoxic crisis or storm: Secondary | ICD-10-CM

## 2021-12-05 DIAGNOSIS — I10 Essential (primary) hypertension: Secondary | ICD-10-CM | POA: Diagnosis not present

## 2021-12-05 DIAGNOSIS — E0865 Diabetes mellitus due to underlying condition with hyperglycemia: Secondary | ICD-10-CM

## 2021-12-05 DIAGNOSIS — G4700 Insomnia, unspecified: Secondary | ICD-10-CM

## 2021-12-05 DIAGNOSIS — E785 Hyperlipidemia, unspecified: Secondary | ICD-10-CM | POA: Diagnosis not present

## 2021-12-05 DIAGNOSIS — K219 Gastro-esophageal reflux disease without esophagitis: Secondary | ICD-10-CM

## 2021-12-05 MED ORDER — METOPROLOL SUCCINATE ER 50 MG PO TB24: 50.0000 mg | ORAL_TABLET | Freq: Every day | ORAL | 1 refills | Status: DC

## 2021-12-05 MED ORDER — EZETIMIBE 10 MG PO TABS: 10.0000 mg | ORAL_TABLET | Freq: Every day | ORAL | 1 refills | Status: DC

## 2021-12-05 MED ORDER — LISINOPRIL 40 MG PO TABS: 40.0000 mg | ORAL_TABLET | Freq: Every day | ORAL | 1 refills | Status: DC

## 2021-12-05 MED ORDER — PIOGLITAZONE HCL 15 MG PO TABS: 15.0000 mg | ORAL_TABLET | Freq: Every day | ORAL | 1 refills | Status: DC

## 2021-12-05 MED ORDER — TRAZODONE HCL 50 MG PO TABS: 25.0000 mg | ORAL_TABLET | Freq: Every day | ORAL | 1 refills | Status: DC

## 2021-12-05 MED ORDER — METFORMIN HCL 1000 MG PO TABS: 1000.0000 mg | ORAL_TABLET | Freq: Two times a day (BID) | ORAL | 1 refills | Status: DC

## 2021-12-05 NOTE — Progress Notes (Signed)
Date:  12/05/2021   Name:  Ann Chaney   DOB:  Jan 29, 1946   MRN:  [id number]   Chief Complaint: Insomnia, Diabetes, Hypertension, and Hyperlipidemia  Insomnia Primary symptoms: difficulty falling asleep, no malaise/fatigue.   The onset quality is gradual. The problem occurs intermittently. The symptoms are aggravated by anxiety. Past treatments include medication. The treatment provided moderate relief.  Diabetes She presents for her follow-up diabetic visit. She has type 2 diabetes mellitus. There are no hypoglycemic associated symptoms. Pertinent negatives for hypoglycemia include no dizziness, headaches or nervousness/anxiousness. There are no diabetic associated symptoms. Pertinent negatives for diabetes include no blurred vision, no chest pain, no fatigue, no polydipsia, no polyuria, no visual change, no weakness and no weight loss. There are no hypoglycemic complications. Symptoms are stable. There are no diabetic complications. Pertinent negatives for diabetic complications include no CVA, PVD or retinopathy. Her weight is fluctuating minimally. She is following a generally healthy diet. Meal planning includes avoidance of concentrated sweets and carbohydrate counting. She participates in exercise intermittently. Her breakfast blood glucose is taken between 8-9 am. Her breakfast blood glucose range is generally 140-180 mg/dl. An ACE inhibitor/angiotensin II receptor blocker is being taken.  Hypertension This is a chronic problem. The current episode started more than 1 year ago. The problem has been gradually improving since onset. The problem is controlled. Pertinent negatives include no blurred vision, chest pain, headaches, malaise/fatigue, neck pain, orthopnea, palpitations, PND or shortness of breath. Past treatments include ACE inhibitors and beta blockers. The current treatment provides moderate improvement. There are no compliance problems.  There is no history of angina, kidney  disease, CAD/MI, CVA, heart failure, left ventricular hypertrophy, PVD or retinopathy. There is no history of chronic renal disease, a hypertension causing med or renovascular disease.  Hyperlipidemia This is a chronic problem. The current episode started more than 1 year ago. The problem is controlled. Recent lipid tests were reviewed and are normal. She has no history of chronic renal disease. Associated symptoms include myalgias. Pertinent negatives include no chest pain or shortness of breath. Current antihyperlipidemic treatment includes ezetimibe. The current treatment provides moderate improvement of lipids. There are no compliance problems.     Lab Results  Component Value Date   NA 138 04/12/2021   K 5.1 08/21/2021   CO2 25 04/12/2021   GLUCOSE 190 (H) 04/12/2021   BUN 11 04/12/2021   CREATININE 0.81 04/12/2021   CALCIUM 9.5 04/12/2021   EGFR 73 11/28/2020   GFRNONAA >60 04/12/2021   Lab Results  Component Value Date   CHOL 267 (H) 08/01/2021   HDL 58 08/01/2021   LDLCALC 177 (H) 08/01/2021   TRIG 174 (H) 08/01/2021   CHOLHDL 6.0 (H) 10/01/2017   Lab Results  Component Value Date   TSH 0.763 07/01/2017   Lab Results  Component Value Date   HGBA1C 6.7 (H) 08/01/2021   Lab Results  Component Value Date   WBC 9.0 04/12/2021   HGB 12.9 08/21/2021   HCT 38.6 04/12/2021   MCV 75.4 (L) 04/12/2021   PLT 322 04/12/2021   Lab Results  Component Value Date   ALT 13 04/12/2021   AST 22 04/12/2021   ALKPHOS 74 04/12/2021   BILITOT 0.4 04/12/2021   Lab Results  Component Value Date   VD25OH 31.1 06/24/2016     Review of Systems  Constitutional: Negative.  Negative for chills, fatigue, fever, malaise/fatigue, unexpected weight change and weight loss.  HENT:  Negative for congestion,  ear discharge, ear pain, rhinorrhea, sinus pressure, sneezing and sore throat.   Eyes:  Negative for blurred vision.  Respiratory:  Negative for cough, shortness of breath, wheezing and  stridor.   Cardiovascular:  Negative for chest pain, palpitations, orthopnea and PND.  Gastrointestinal:  Negative for abdominal pain, blood in stool, constipation, diarrhea and nausea.  Endocrine: Negative for polydipsia and polyuria.  Genitourinary:  Negative for dysuria, flank pain, frequency, hematuria, urgency and vaginal discharge.  Musculoskeletal:  Positive for myalgias. Negative for arthralgias, back pain and neck pain.  Skin:  Negative for rash.  Neurological:  Negative for dizziness, weakness and headaches.  Hematological:  Negative for adenopathy. Does not bruise/bleed easily.  Psychiatric/Behavioral:  Negative for dysphoric mood. The patient has insomnia. The patient is not nervous/anxious.     Patient Active Problem List   Diagnosis Date Noted   Gastroesophageal reflux disease 04/03/2021   Diabetes mellitus due to underlying condition, uncontrolled, with hyperglycemia ([place]) 04/03/2021   Constipation 02/13/2021   S/P resection of meningioma 01/23/2021   Meningioma ([place]) 01/14/2021   Statin myopathy 03/09/2020   Drug-induced myopathy 09/23/2019   Multinodular goiter 04/16/2018   Mixed diabetic hyperlipidemia associated with type 2 diabetes mellitus ([place]) 11/10/2017   Tobacco dependence 11/10/2017   Microcytosis 11/17/2016   Depression, major, single episode, complete remission ([place]) 11/17/2016   Insomnia 07/22/2016   Hyperlipidemia 07/22/2016   Type 2 diabetes mellitus with hyperglycemia, without long-term current use of insulin ([place]) 06/24/2016   Dental abscess 08/27/2015   Smoker 11/04/2013   Goiter 01/26/2012   Essential hypertension 12/17/2009    Allergies  Allergen Reactions   Atorvastatin Other (See Comments)    Muscle pain   Pravastatin Sodium Nausea Only    Past Surgical History:  Procedure Laterality Date   ABDOMINAL HYSTERECTOMY     CRANIOTOMY Left 01/14/2021   Procedure: Craniotomy - left - Frontal - Tumor;  Surgeon: Earnie Larsson, MD;  Location: [facility];  Service: Neurosurgery;  Laterality: Left;   HYSTEROTOMY      Social History   Tobacco Use   Smoking status: Former    Packs/day: 0.50    Types: Cigarettes    Quit date: 07/31/2020    Years since quitting: 1.3   Smokeless tobacco: Never  Vaping Use   Vaping Use: Never used  Substance Use Topics   Alcohol use: No   Drug use: No     Medication list has been reviewed and updated.  Current Meds  Medication Sig   ACCU-CHEK AVIVA PLUS test strip 1 each daily.   Accu-Chek Softclix Lancets lancets    acetaminophen (TYLENOL) 500 MG tablet Take 500 mg by mouth every 6 (six) hours as needed (pain).   ezetimibe (ZETIA) 10 MG tablet Take 1 tablet (10 mg total) by mouth daily.   ferrous sulfate 325 (65 FE) MG tablet Take 1 tablet (325 mg total) by mouth daily with breakfast.   latanoprost (XALATAN) 0.005 % ophthalmic solution Place 1 drop into both eyes at bedtime.   lisinopril (ZESTRIL) 40 MG tablet TAKE 1 TABLET BY MOUTH DAILY   melatonin 3 MG TABS tablet Take 1 tablet (3 mg total) by mouth at bedtime.   metFORMIN (GLUCOPHAGE) 1000 MG tablet Take 1 tablet (1,000 mg total) by mouth 2 (two) times daily with a meal.   methimazole (TAPAZOLE) 5 MG tablet Take 0.5 tablets (2.5 mg total) by mouth in the morning.   metoprolol succinate (TOPROL-XL) 50 MG 24 hr tablet Take 1 tablet (  50 mg total) by mouth daily. Take with or immediately following a meal.   Multiple Vitamin (MULTIVITAMIN WITH MINERALS) TABS tablet Take 1 tablet by mouth daily.   pioglitazone (ACTOS) 15 MG tablet Take 1 tablet (15 mg total) by mouth daily.   traZODone (DESYREL) 50 MG tablet Take 0.5 tablets (25 mg total) by mouth at bedtime.   [DISCONTINUED] glipiZIDE (GLUCOTROL) 5 MG tablet Take 5 mg by mouth daily.   [DISCONTINUED] pantoprazole (PROTONIX) 40 MG tablet Take 1 tablet (40 mg total) by mouth at bedtime.       12/05/2021   11:06 AM 08/21/2021   10:54 AM 08/01/2021   10:46 AM 04/03/2021   10:40 AM  GAD 7 :  Generalized Anxiety Score  Nervous, Anxious, on Edge 0 0 0 0  Control/stop worrying 0 0 0 0  Worry too much - different things 0 0 0 0  Trouble relaxing 0 0 0 0  Restless 0 0 0 0  Easily annoyed or irritable 0 0 0 0  Afraid - awful might happen 0 0 0 0  Total GAD 7 Score 0 0 0 0  Anxiety Difficulty Not difficult at all Not difficult at all Not difficult at all Not difficult at all       12/05/2021   11:06 AM 08/21/2021   10:53 AM 08/12/2021   10:38 AM  Depression screen PHQ 2/9  Decreased Interest 0 0 0  Down, Depressed, Hopeless 0 0 0  PHQ - 2 Score 0 0 0  Altered sleeping 0 0   Tired, decreased energy 0 0   Change in appetite 0 0   Feeling bad or failure about yourself  0 0   Trouble concentrating 0 0   Moving slowly or fidgety/restless 0 0   Suicidal thoughts 0 0   PHQ-9 Score 0 0   Difficult doing work/chores Not difficult at all Not difficult at all     BP Readings from Last 3 Encounters:  12/05/21 116/64  08/21/21 130/78  08/01/21 120/80    Physical Exam Vitals and nursing note reviewed. Exam conducted with a chaperone present.  Constitutional:      General: She is not in acute distress.    Appearance: She is not diaphoretic.  HENT:     Head: Normocephalic and atraumatic.     Right Ear: Tympanic membrane and external ear normal.     Left Ear: Tympanic membrane and external ear normal.     Nose: Nose normal. No congestion or rhinorrhea.  Eyes:     General:        Right eye: No discharge.        Left eye: No discharge.     Conjunctiva/sclera: Conjunctivae normal.     Pupils: Pupils are equal, round, and reactive to light.  Neck:     Thyroid: No thyromegaly.     Vascular: No JVD.  Cardiovascular:     Rate and Rhythm: Normal rate and regular rhythm.     Heart sounds: Normal heart sounds. No murmur heard.    No friction rub. No gallop.  Pulmonary:     Effort: Pulmonary effort is normal.     Breath sounds: Normal breath sounds.  Abdominal:     General:  Bowel sounds are normal.     Palpations: Abdomen is soft. There is no mass.     Tenderness: There is no abdominal tenderness. There is no guarding.  Musculoskeletal:        General: Normal  range of motion.     Cervical back: Normal range of motion and neck supple.  Lymphadenopathy:     Cervical: No cervical adenopathy.  Skin:    General: Skin is warm and dry.  Neurological:     Mental Status: She is alert.     Wt Readings from Last 3 Encounters:  12/05/21 133 lb (60.3 kg)  08/21/21 127 lb (57.6 kg)  08/01/21 126 lb (57.2 kg)    BP 116/64   Pulse 66   Ht $R'5\' 1"'Jy$  (1.549 m)   Wt 133 lb (60.3 kg)   BMI 25.13 kg/m   Assessment and Plan:  1. Essential hypertension Chronic.  Controlled.  Stable.  Blood pressure 116/64.  Continue lisinopril 40 mg once a day and metoprolol XL 50 mg once a day.  Will check CMP for electrolytes and GFR. - lisinopril (ZESTRIL) 40 MG tablet; Take 1 tablet (40 mg total) by mouth daily.  Dispense: 90 tablet; Refill: 1 - metoprolol succinate (TOPROL-XL) 50 MG 24 hr tablet; Take 1 tablet (50 mg total) by mouth daily. Take with or immediately following a meal.  Dispense: 90 tablet; Refill: 1 - Comprehensive Metabolic Panel (CMET)  2. Hyperlipidemia, unspecified hyperlipidemia type Chronic.  Controlled.  Stable.  Continue Zetia 10 mg once a day.  We will check lipid panel for current level of control. - ezetimibe (ZETIA) 10 MG tablet; Take 1 tablet (10 mg total) by mouth daily.  Dispense: 90 tablet; Refill: 1 - Lipid Panel With LDL/HDL Ratio  3. Gastroesophageal reflux disease, unspecified whether esophagitis present Chronic.  Controlled.  Stable.  Patient is no longer needing pantoprazole and we will continue to observe and use on an as-needed basis of PPI or H2 blocker.  4. Diabetes mellitus due to underlying condition, uncontrolled, with hyperglycemia (HCC) Chronic.  Controlled.  Stable.  Fasting blood sugars have been in the 160 range but that is taken  after having cream in her coffee we will wait and determine further involvement in the meantime we will continue her Actos 15 mg once a day and check A1c and microalbuminuria. - pioglitazone (ACTOS) 15 MG tablet; Take 1 tablet (15 mg total) by mouth daily.  Dispense: 90 tablet; Refill: 1 - HgB A1c - Microalbumin / creatinine urine ratio - Comprehensive Metabolic Panel (CMET)  5. Insomnia, unspecified type Chronic.  Uncontrolled.  We will continue trazodone 25 mg nightly. - traZODone (DESYREL) 50 MG tablet; Take 0.5 tablets (25 mg total) by mouth at bedtime.  Dispense: 45 tablet; Refill: 1  6. 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  Patient was unaware that she needed to make a reappointment since she missed with [facility] while she was in the hospital with a craniotomy.  We will call to see if we can get her back on schedule with endocrinology for continued follow-up for this issue.  7. Need for immunization against influenza Discussed and administered - Flu Vaccine QUAD High Dose(Fluad)    Otilio Miu, MD

## 2021-12-06 ENCOUNTER — Other Ambulatory Visit: Payer: Self-pay

## 2021-12-06 DIAGNOSIS — E0865 Diabetes mellitus due to underlying condition with hyperglycemia: Secondary | ICD-10-CM | POA: Diagnosis not present

## 2021-12-06 LAB — COMPREHENSIVE METABOLIC PANEL
ALT: 13 IU/L (ref 0–32)
AST: 17 IU/L (ref 0–40)
Albumin/Globulin Ratio: 1.9 (ref 1.2–2.2)
Albumin: 4.6 g/dL (ref 3.8–4.8)
Alkaline Phosphatase: 101 IU/L (ref 44–121)
BUN/Creatinine Ratio: 16 (ref 12–28)
BUN: 13 mg/dL (ref 8–27)
Bilirubin Total: 0.4 mg/dL (ref 0.0–1.2)
CO2: 24 mmol/L (ref 20–29)
Calcium: 10 mg/dL (ref 8.7–10.3)
Chloride: 103 mmol/L (ref 96–106)
Creatinine, Ser: 0.8 mg/dL (ref 0.57–1.00)
Globulin, Total: 2.4 g/dL (ref 1.5–4.5)
Glucose: 103 mg/dL — ABNORMAL HIGH (ref 70–99)
Potassium: 4.4 mmol/L (ref 3.5–5.2)
Sodium: 140 mmol/L (ref 134–144)
Total Protein: 7 g/dL (ref 6.0–8.5)
eGFR: 77 mL/min/{1.73_m2} (ref >59)

## 2021-12-06 LAB — LIPID PANEL WITH LDL/HDL RATIO
Cholesterol, Total: 193 mg/dL (ref 100–199)
HDL: 51 mg/dL (ref >39)
LDL Chol Calc (NIH): 118 mg/dL — ABNORMAL HIGH (ref 0–99)
LDL/HDL Ratio: 2.3 ratio (ref 0.0–3.2)
Triglycerides: 137 mg/dL (ref 0–149)
VLDL Cholesterol Cal: 24 mg/dL (ref 5–40)

## 2021-12-06 LAB — HEMOGLOBIN A1C
Est. average glucose Bld gHb Est-mCnc: 146 mg/dL
Hgb A1c MFr Bld: 6.7 % — ABNORMAL HIGH (ref 4.8–5.6)

## 2021-12-08 LAB — MICROALBUMIN / CREATININE URINE RATIO
Creatinine, Urine: 106.8 mg/dL
Microalb/Creat Ratio: 10 mg/g creat (ref 0–29)
Microalbumin, Urine: 10.5 ug/mL

## 2021-12-09 ENCOUNTER — Other Ambulatory Visit: Payer: Self-pay

## 2021-12-09 DIAGNOSIS — E0500 Thyrotoxicosis with diffuse goiter without thyrotoxic crisis or storm: Secondary | ICD-10-CM

## 2021-12-09 MED ORDER — METHIMAZOLE 5 MG PO TABS: 2.5000 mg | ORAL_TABLET | Freq: Every morning | ORAL | 0 refills | Status: DC

## 2021-12-18 ENCOUNTER — Other Ambulatory Visit: Payer: Self-pay | Admitting: Family Medicine

## 2021-12-18 DIAGNOSIS — E785 Hyperlipidemia, unspecified: Secondary | ICD-10-CM

## 2021-12-18 NOTE — Telephone Encounter (Signed)
Unable to refill per protocol, last refill by provider 12/05/21 for 90 and 1 rf.E-Prescribing Status: Receipt confirmed by pharmacy (12/05/2021 11:29 AM). Will refuse duplicate request.  Requested Prescriptions  Pending Prescriptions Disp Refills  . ezetimibe (ZETIA) 10 MG tablet [Pharmacy Med Name: Ezetimibe 10 MG Oral Tablet] 90 tablet 0    Sig: Take 1 tablet by mouth once daily     Cardiovascular:  Antilipid - Sterol Transport Inhibitors Failed - 12/18/2021  3:54 PM      Failed - Lipid Panel in normal range within the last 12 months    Cholesterol, Total  Date Value Ref Range Status  12/05/2021 193 100 - 199 mg/dL Final   LDL Chol Calc (NIH)  Date Value Ref Range Status  12/05/2021 118 (H) 0 - 99 mg/dL Final   HDL  Date Value Ref Range Status  12/05/2021 51 >39 mg/dL Final   Triglycerides  Date Value Ref Range Status  12/05/2021 137 0 - 149 mg/dL Final         Passed - AST in normal range and within 360 days    AST  Date Value Ref Range Status  12/05/2021 17 0 - 40 IU/L Final         Passed - ALT in normal range and within 360 days    ALT  Date Value Ref Range Status  12/05/2021 13 0 - 32 IU/L Final         Passed - Patient is not pregnant      Passed - Valid encounter within last 12 months    Recent Outpatient Visits          1 week ago Essential hypertension   [place] [facility] [place], Deanna C, MD   3 months ago Annual physical exam   [place] [facility] at [facility], MD   4 months ago Diabetes mellitus due to underlying condition, uncontrolled, with hyperglycemia ([place])   [place] [facility] at [place], Deanna C, MD   8 months ago Essential hypertension   [place] [facility] at [place], Deanna C, MD   1 year ago Essential hypertension   [place] [facility] at  [place], [place], MD      Future Appointments            In 5 months Juline Patch, MD [facility] at [facility], [facility]

## 2021-12-27 ENCOUNTER — Telehealth: Payer: Self-pay | Admitting: Family Medicine

## 2021-12-27 DIAGNOSIS — E785 Hyperlipidemia, unspecified: Secondary | ICD-10-CM

## 2021-12-27 NOTE — Telephone Encounter (Signed)
Refilled 12/05/2021 #90 1 rf. Requested Prescriptions  Pending Prescriptions Disp Refills  . ezetimibe (ZETIA) 10 MG tablet [Pharmacy Med Name: Ezetimibe 10 MG Oral Tablet] 90 tablet 0    Sig: Take 1 tablet by mouth once daily     Cardiovascular:  Antilipid - Sterol Transport Inhibitors Failed - 12/27/2021  1:38 PM      Failed - Lipid Panel in normal range within the last 12 months    Cholesterol, Total  Date Value Ref Range Status  12/05/2021 193 100 - 199 mg/dL Final   LDL Chol Calc (NIH)  Date Value Ref Range Status  12/05/2021 118 (H) 0 - 99 mg/dL Final   HDL  Date Value Ref Range Status  12/05/2021 51 >39 mg/dL Final   Triglycerides  Date Value Ref Range Status  12/05/2021 137 0 - 149 mg/dL Final         Passed - AST in normal range and within 360 days    AST  Date Value Ref Range Status  12/05/2021 17 0 - 40 IU/L Final         Passed - ALT in normal range and within 360 days    ALT  Date Value Ref Range Status  12/05/2021 13 0 - 32 IU/L Final         Passed - Patient is not pregnant      Passed - Valid encounter within last 12 months    Recent Outpatient Visits          3 weeks ago Essential hypertension   [place] [facility] at [place], Deanna C, MD   4 months ago Annual physical exam   [place] [facility] at [facility], MD   4 months ago Diabetes mellitus due to underlying condition, uncontrolled, with hyperglycemia ([place])   [place] [facility] at [place], Deanna C, MD   8 months ago Essential hypertension   [place] [facility] at [place], Deanna C, MD   1 year ago Essential hypertension    Primary Care and Sports Medicine at [place], [place], MD      Future Appointments            In 5 months Juline Patch, MD [facility] at  [facility], [facility]

## 2021-12-31 ENCOUNTER — Other Ambulatory Visit: Payer: Self-pay

## 2021-12-31 DIAGNOSIS — E785 Hyperlipidemia, unspecified: Secondary | ICD-10-CM

## 2021-12-31 MED ORDER — EZETIMIBE 10 MG PO TABS: 10.0000 mg | ORAL_TABLET | Freq: Every day | ORAL | 0 refills | Status: DC

## 2021-12-31 NOTE — Telephone Encounter (Addendum)
Patient spoke with [facility] and they decline having a ezetimibe (ZETIA) 10 MG tablet on file and requesting PCP to send in a script. Patient states please call daughter if there is any concerns  [place], [place] Phone:  [phone]  Fax:  [phone]

## 2022-01-23 DIAGNOSIS — E1165 Type 2 diabetes mellitus with hyperglycemia: Secondary | ICD-10-CM | POA: Diagnosis not present

## 2022-01-23 DIAGNOSIS — E782 Mixed hyperlipidemia: Secondary | ICD-10-CM | POA: Diagnosis not present

## 2022-01-23 DIAGNOSIS — E0590 Thyrotoxicosis, unspecified without thyrotoxic crisis or storm: Secondary | ICD-10-CM | POA: Diagnosis not present

## 2022-01-23 DIAGNOSIS — E042 Nontoxic multinodular goiter: Secondary | ICD-10-CM | POA: Diagnosis not present

## 2022-01-23 DIAGNOSIS — I152 Hypertension secondary to endocrine disorders: Secondary | ICD-10-CM | POA: Diagnosis not present

## 2022-01-23 DIAGNOSIS — E1159 Type 2 diabetes mellitus with other circulatory complications: Secondary | ICD-10-CM | POA: Diagnosis not present

## 2022-01-23 DIAGNOSIS — E1169 Type 2 diabetes mellitus with other specified complication: Secondary | ICD-10-CM | POA: Diagnosis not present

## 2022-01-31 ENCOUNTER — Telehealth: Payer: Self-pay | Admitting: Family Medicine

## 2022-01-31 NOTE — Telephone Encounter (Signed)
Relay message to daughter

## 2022-01-31 NOTE — Telephone Encounter (Signed)
Pt called to get refill for methimazole (TAPAZOLE) 5 MG tablet / last note on RX says that Endo will pick up the RX / please advise

## 2022-02-17 DIAGNOSIS — E042 Nontoxic multinodular goiter: Secondary | ICD-10-CM | POA: Diagnosis not present

## 2022-03-04 ENCOUNTER — Other Ambulatory Visit: Payer: Self-pay | Admitting: Family Medicine

## 2022-03-04 DIAGNOSIS — I10 Essential (primary) hypertension: Secondary | ICD-10-CM

## 2022-03-08 ENCOUNTER — Other Ambulatory Visit: Payer: Self-pay | Admitting: Family Medicine

## 2022-03-08 DIAGNOSIS — I10 Essential (primary) hypertension: Secondary | ICD-10-CM

## 2022-03-10 NOTE — Telephone Encounter (Signed)
Rx was sent to [facility] on 12/05/21 #90/1. Pt has refills until OV in March 2024.   Requested Prescriptions  Refused Prescriptions Disp Refills   metoprolol succinate (TOPROL-XL) 50 MG 24 hr tablet [Pharmacy Med Name: Metoprolol Succinate ER 50 MG Oral Tablet Extended Release 24 Hour] 100 tablet 2    Sig: TAKE 1 TABLET BY MOUTH DAILY  WITH OR IMMEDIATELY FOLLOWING A  MEAL     Cardiovascular:  Beta Blockers Passed - 03/08/2022  6:24 AM      Passed - Last BP in normal range    BP Readings from Last 1 Encounters:  12/05/21 116/64         Passed - Last Heart Rate in normal range    Pulse Readings from Last 1 Encounters:  12/05/21 66         Passed - Valid encounter within last 6 months    Recent Outpatient Visits           3 months ago Essential hypertension   [place] [facility] at [place], Deanna C, MD   6 months ago Annual physical exam   [place] [facility] at [facility], MD   7 months ago Diabetes mellitus due to underlying condition, uncontrolled, with hyperglycemia ([place])   [place] [facility] at [place], [place], MD   11 months ago Essential hypertension   [place] [facility] at [place], Deanna C, MD   1 year ago Essential hypertension   [place] [facility] at [place], [place], MD       Future Appointments             Tomorrow Juline Patch, MD [facility] at [facility], [place]   In 2 months Juline Patch, MD [place] [facility] at [facility], [facility]

## 2022-03-11 ENCOUNTER — Ambulatory Visit (INDEPENDENT_AMBULATORY_CARE_PROVIDER_SITE_OTHER): Payer: Medicare Other | Admitting: Family Medicine

## 2022-03-11 ENCOUNTER — Encounter: Payer: Self-pay | Admitting: Family Medicine

## 2022-03-11 VITALS — BP 120/78 | HR 83 | Ht 61.0 in | Wt 135.0 lb

## 2022-03-11 DIAGNOSIS — G720 Drug-induced myopathy: Secondary | ICD-10-CM

## 2022-03-11 DIAGNOSIS — T466X5A Adverse effect of antihyperlipidemic and antiarteriosclerotic drugs, initial encounter: Secondary | ICD-10-CM | POA: Diagnosis not present

## 2022-03-11 DIAGNOSIS — E785 Hyperlipidemia, unspecified: Secondary | ICD-10-CM | POA: Diagnosis not present

## 2022-03-11 MED ORDER — EZETIMIBE 10 MG PO TABS: 10.0000 mg | ORAL_TABLET | Freq: Every day | ORAL | 1 refills | Status: DC

## 2022-03-11 MED ORDER — ROSUVASTATIN CALCIUM 5 MG PO TABS: 5.0000 mg | ORAL_TABLET | ORAL | 0 refills | Status: DC

## 2022-03-11 NOTE — Progress Notes (Signed)
Date:  03/11/2022   Name:  Ann Chaney   DOB:  1946-01-22   MRN:  [id number]   Chief Complaint: Hyperlipidemia (Needed Dx code stating statin myopathy and is good control on Zetia)  Hyperlipidemia This is a chronic problem. The current episode started more than 1 year ago. The problem is uncontrolled. Associated symptoms include myalgias. Pertinent negatives include no chest pain, focal sensory loss, focal weakness, leg pain or shortness of breath. Current antihyperlipidemic treatment includes diet change. The current treatment provides mild (110) improvement of lipids. There are no compliance problems.  Risk factors for coronary artery disease include dyslipidemia, diabetes mellitus and hypertension.    Lab Results  Component Value Date   NA 140 12/05/2021   K 4.4 12/05/2021   CO2 24 12/05/2021   GLUCOSE 103 (H) 12/05/2021   BUN 13 12/05/2021   CREATININE 0.80 12/05/2021   CALCIUM 10.0 12/05/2021   EGFR 77 12/05/2021   GFRNONAA >60 04/12/2021   Lab Results  Component Value Date   CHOL 193 12/05/2021   HDL 51 12/05/2021   LDLCALC 118 (H) 12/05/2021   TRIG 137 12/05/2021   CHOLHDL 6.0 (H) 10/01/2017   Lab Results  Component Value Date   TSH 0.763 07/01/2017   Lab Results  Component Value Date   HGBA1C 6.7 (H) 12/05/2021   Lab Results  Component Value Date   WBC 9.0 04/12/2021   HGB 12.9 08/21/2021   HCT 38.6 04/12/2021   MCV 75.4 (L) 04/12/2021   PLT 322 04/12/2021   Lab Results  Component Value Date   ALT 13 12/05/2021   AST 17 12/05/2021   ALKPHOS 101 12/05/2021   BILITOT 0.4 12/05/2021   Lab Results  Component Value Date   VD25OH 31.1 06/24/2016     Review of Systems  Respiratory:  Negative for chest tightness, shortness of breath and wheezing.   Cardiovascular:  Negative for chest pain, palpitations and leg swelling.  Gastrointestinal:  Negative for abdominal pain.  Musculoskeletal:  Positive for myalgias.  Neurological:  Negative for focal  weakness.    Patient Active Problem List   Diagnosis Date Noted   Gastroesophageal reflux disease 04/03/2021   Diabetes mellitus due to underlying condition, uncontrolled, with hyperglycemia ([place]) 04/03/2021   Constipation 02/13/2021   S/P resection of meningioma 01/23/2021   Meningioma ([place]) 01/14/2021   Drug-induced myopathy 09/23/2019   Multinodular goiter 04/16/2018   Mixed diabetic hyperlipidemia associated with type 2 diabetes mellitus ([place]) 11/10/2017   Tobacco dependence 11/10/2017   Microcytosis 11/17/2016   Depression, major, single episode, complete remission ([place]) 11/17/2016   Insomnia 07/22/2016   Hyperlipidemia 07/22/2016   Type 2 diabetes mellitus with hyperglycemia, without long-term current use of insulin ([place]) 06/24/2016   Dental abscess 08/27/2015   Smoker 11/04/2013   Goiter 01/26/2012   Essential hypertension 12/17/2009    Allergies  Allergen Reactions   Atorvastatin Other (See Comments)    Muscle pain   Pravastatin Sodium Nausea Only    Past Surgical History:  Procedure Laterality Date   ABDOMINAL HYSTERECTOMY     CRANIOTOMY Left 01/14/2021   Procedure: Craniotomy - left - Frontal - Tumor;  Surgeon: Earnie Larsson, MD;  Location: [facility];  Service: Neurosurgery;  Laterality: Left;   HYSTEROTOMY      Social History   Tobacco Use   Smoking status: Former    Packs/day: 0.50    Types: Cigarettes    Quit date: 07/31/2020    Years since quitting: 1.6  Smokeless tobacco: Never  Vaping Use   Vaping Use: Never used  Substance Use Topics   Alcohol use: No   Drug use: No     Medication list has been reviewed and updated.  Current Meds  Medication Sig   ACCU-CHEK AVIVA PLUS test strip 1 each daily.   Accu-Chek Softclix Lancets lancets    acetaminophen (TYLENOL) 500 MG tablet Take 500 mg by mouth every 6 (six) hours as needed (pain).   ezetimibe (ZETIA) 10 MG tablet Take 1 tablet (10 mg total) by mouth daily.   ferrous sulfate 325 (65 FE) MG tablet  Take 1 tablet (325 mg total) by mouth daily with breakfast.   latanoprost (XALATAN) 0.005 % ophthalmic solution Place 1 drop into both eyes at bedtime.   lisinopril (ZESTRIL) 40 MG tablet TAKE 1 TABLET BY MOUTH DAILY   melatonin 3 MG TABS tablet Take 1 tablet (3 mg total) by mouth at bedtime.   metFORMIN (GLUCOPHAGE) 1000 MG tablet Take 1 tablet (1,000 mg total) by mouth 2 (two) times daily with a meal.   methimazole (TAPAZOLE) 5 MG tablet Take 0.5 tablets (2.5 mg total) by mouth in the morning.   metoprolol succinate (TOPROL-XL) 50 MG 24 hr tablet Take 1 tablet (50 mg total) by mouth daily. Take with or immediately following a meal.   Multiple Vitamin (MULTIVITAMIN WITH MINERALS) TABS tablet Take 1 tablet by mouth daily.   pioglitazone (ACTOS) 15 MG tablet Take 1 tablet (15 mg total) by mouth daily.   traZODone (DESYREL) 50 MG tablet Take 0.5 tablets (25 mg total) by mouth at bedtime.       03/11/2022   10:02 AM 12/05/2021   11:06 AM 08/21/2021   10:54 AM 08/01/2021   10:46 AM  GAD 7 : Generalized Anxiety Score  Nervous, Anxious, on Edge 0 0 0 0  Control/stop worrying 0 0 0 0  Worry too much - different things 0 0 0 0  Trouble relaxing 0 0 0 0  Restless 0 0 0 0  Easily annoyed or irritable 0 0 0 0  Afraid - awful might happen 0 0 0 0  Total GAD 7 Score 0 0 0 0  Anxiety Difficulty Not difficult at all Not difficult at all Not difficult at all Not difficult at all       03/11/2022   10:01 AM 12/05/2021   11:06 AM 08/21/2021   10:53 AM  Depression screen PHQ 2/9  Decreased Interest 0 0 0  Down, Depressed, Hopeless 0 0 0  PHQ - 2 Score 0 0 0  Altered sleeping 0 0 0  Tired, decreased energy 0 0 0  Change in appetite 0 0 0  Feeling bad or failure about yourself  0 0 0  Trouble concentrating 0 0 0  Moving slowly or fidgety/restless 0 0 0  Suicidal thoughts 0 0 0  PHQ-9 Score 0 0 0  Difficult doing work/chores Not difficult at all Not difficult at all Not difficult at all    BP  Readings from Last 3 Encounters:  03/11/22 120/78  12/05/21 116/64  08/21/21 130/78    Physical Exam Vitals and nursing note reviewed.  Cardiovascular:     Heart sounds: S1 normal and S2 normal. No murmur heard.    No systolic murmur is present.     No diastolic murmur is present.     No gallop. No S3 or S4 sounds.  Pulmonary:     Breath sounds: No decreased breath  sounds, wheezing, rhonchi or rales.  Abdominal:     Palpations: There is no hepatomegaly or splenomegaly.     Wt Readings from Last 3 Encounters:  03/11/22 135 lb (61.2 kg)  12/05/21 133 lb (60.3 kg)  08/21/21 127 lb (57.6 kg)    BP 120/78   Pulse 83   Ht _0  (1.549 m)   Wt 135 lb (61.2 kg)   SpO2 97%   BMI 25.51 kg/m   Assessment and Plan:  1. Statin myopathy Chronic.  Episodic.  Patient has not been able to take atorvastatin in the past because of nausea and myalgias.  She is currently tolerating Zetia 10 mg once a day that has reduced her LDL from 1 70-1 10 however the powers to be will not except this and we are seeing patient today to verify this once again - ezetimibe (ZETIA) 10 MG tablet; Take 1 tablet (10 mg total) by mouth daily.  Dispense: 90 tablet; Refill: 1  2. Hyperlipidemia, unspecified hyperlipidemia type After discussion patient has agreed to try 1 more time another statin which is going to be rosuvastatin 5 mg once a week.  If she is to have any issues particularly with myalgias she is to call us and inform us immediately and stop the medication so that we can document this and hopefully they will leave [place] alone at least for another year - ezetimibe (ZETIA) 10 MG tablet; Take 1 tablet (10 mg total) by mouth daily.  Dispense: 90 tablet; Refill: 1 - rosuvastatin (CRESTOR) 5 MG tablet; Take 1 tablet (5 mg total) by mouth once a week for 12 days.  Dispense: 12 tablet; Refill: 0    Otilio Miu, MD

## 2022-04-23 ENCOUNTER — Other Ambulatory Visit: Payer: Self-pay | Admitting: Family Medicine

## 2022-04-23 DIAGNOSIS — E785 Hyperlipidemia, unspecified: Secondary | ICD-10-CM

## 2022-04-23 DIAGNOSIS — G720 Drug-induced myopathy: Secondary | ICD-10-CM

## 2022-04-23 NOTE — Telephone Encounter (Signed)
Requested Prescriptions  Pending Prescriptions Disp Refills   ezetimibe (ZETIA) 10 MG tablet [Pharmacy Med Name: Ezetimibe 10 MG Oral Tablet] 90 tablet 3    Sig: TAKE 1 TABLET BY MOUTH DAILY     Cardiovascular:  Antilipid - Sterol Transport Inhibitors Failed - 04/23/2022  7:50 AM      Failed - Lipid Panel in normal range within the last 12 months    Cholesterol, Total  Date Value Ref Range Status  12/05/2021 193 100 - 199 mg/dL Final   LDL Chol Calc (NIH)  Date Value Ref Range Status  12/05/2021 118 (H) 0 - 99 mg/dL Final   HDL  Date Value Ref Range Status  12/05/2021 51 >39 mg/dL Final   Triglycerides  Date Value Ref Range Status  12/05/2021 137 0 - 149 mg/dL Final         Passed - AST in normal range and within 360 days    AST  Date Value Ref Range Status  12/05/2021 17 0 - 40 IU/L Final         Passed - ALT in normal range and within 360 days    ALT  Date Value Ref Range Status  12/05/2021 13 0 - 32 IU/L Final         Passed - Patient is not pregnant      Passed - Valid encounter within last 12 months    Recent Outpatient Visits           1 month ago Statin myopathy   [facility] [facility] [place], Deanna C, MD   4 months ago Essential hypertension   [place] [facility] [place], Deanna C, MD   8 months ago Annual physical exam   [facility] [place], Deanna C, MD   8 months ago Diabetes mellitus due to underlying condition, uncontrolled, with hyperglycemia [facility])   [facility] [place], Deanna C, MD   1 year ago Essential hypertension   [place] [facility] [place], [place], MD       Future Appointments             In 1 month Juline Patch, MD [facility] at [facility], [place]   In 1 month Juline Patch,  MD [place] at [facility], [facility]

## 2022-05-03 ENCOUNTER — Other Ambulatory Visit: Payer: Self-pay | Admitting: Family Medicine

## 2022-05-03 DIAGNOSIS — I10 Essential (primary) hypertension: Secondary | ICD-10-CM

## 2022-05-26 ENCOUNTER — Encounter: Payer: Self-pay | Admitting: Pharmacist

## 2022-05-26 NOTE — Progress Notes (Signed)
[facility] [facility]) [place] [facility] Statin Quality Measure Assessment  05/26/2022  Ann Chaney 1946/01/25 [id number]  Per review of chart and payor information, patient has a diagnosis of diabetes but is not currently filling a statin prescription.  This places patient into the Statin Use In Patients with Diabetes (SUPD) measure for CMS.    Patient  has documented intolerance to atorvastatin and pravastatin. Trial of rosuvastatin in March 26, 2022. Patient was last coded for statin intolerance in 2022-03-26.   The 10-year ASCVD risk score (Arnett DK, et al., 2019) is: 28.7%   Values used to calculate the score:     Age: 3 years     Sex: Female     Is Non-Hispanic African American: Yes     Diabetic: Yes     Tobacco smoker: No     Systolic Blood Pressure: 123456 mmHg     Is BP treated: Yes     HDL Cholesterol: 51 mg/dL     Total Cholesterol: 193 mg/dL 12/05/2021     Component Value Date/Time   CHOL 193 12/05/2021 1144   TRIG 137 12/05/2021 1144   HDL 51 12/05/2021 1144   CHOLHDL 6.0 (H) 10/01/2017 1522   LDLCALC 118 (H) 12/05/2021 1144    Please consider ONE of the following recommendations:  Initiate high intensity statin Atorvastatin 40 mg once daily, #90, 3 refills   Rosuvastatin 20 mg once daily, #90, 3 refills    Initiate moderate intensity          statin with reduced frequency if prior          statin intolerance 1x weekly, #13, 3 refills   2x weekly, #26, 3 refills   3x weekly, #39, 3 refills    Code for past statin intolerance or  other exclusions (required annually)  Provider Requirements: Associate code during an office visit or telehealth encounter  Drug Induced Myopathy G72.0   Myopathy, unspecified G72.9   Myositis, unspecified M60.9   Rhabdomyolysis M62.82   Cirrhosis of liver K74.69   Prediabetes R73.03   PCOS E28.2   Thank you for allowing [facility] pharmacy to be a part of this patient's care.   Loretha Brasil, PharmD [place] Clinical Pharmacist Direct Dial: [phone]

## 2022-05-27 ENCOUNTER — Encounter: Payer: Self-pay | Admitting: Family Medicine

## 2022-05-27 ENCOUNTER — Ambulatory Visit (INDEPENDENT_AMBULATORY_CARE_PROVIDER_SITE_OTHER): Payer: Medicare Other | Admitting: Family Medicine

## 2022-05-27 VITALS — BP 120/78 | HR 80 | Ht 61.0 in | Wt 137.0 lb

## 2022-05-27 DIAGNOSIS — I10 Essential (primary) hypertension: Secondary | ICD-10-CM

## 2022-05-27 DIAGNOSIS — G720 Drug-induced myopathy: Secondary | ICD-10-CM

## 2022-05-27 DIAGNOSIS — E0865 Diabetes mellitus due to underlying condition with hyperglycemia: Secondary | ICD-10-CM | POA: Diagnosis not present

## 2022-05-27 DIAGNOSIS — E785 Hyperlipidemia, unspecified: Secondary | ICD-10-CM

## 2022-05-27 DIAGNOSIS — G4700 Insomnia, unspecified: Secondary | ICD-10-CM | POA: Diagnosis not present

## 2022-05-27 DIAGNOSIS — T466X5A Adverse effect of antihyperlipidemic and antiarteriosclerotic drugs, initial encounter: Secondary | ICD-10-CM

## 2022-05-27 DIAGNOSIS — D649 Anemia, unspecified: Secondary | ICD-10-CM

## 2022-05-27 MED ORDER — ROSUVASTATIN CALCIUM 5 MG PO TABS: 5.0000 mg | ORAL_TABLET | ORAL | 1 refills | Status: DC

## 2022-05-27 MED ORDER — TRAZODONE HCL 50 MG PO TABS: 25.0000 mg | ORAL_TABLET | Freq: Every day | ORAL | 1 refills | Status: DC

## 2022-05-27 MED ORDER — FERROUS SULFATE 325 (65 FE) MG PO TABS: 325.0000 mg | ORAL_TABLET | Freq: Every day | ORAL | 5 refills | Status: DC

## 2022-05-27 MED ORDER — PIOGLITAZONE HCL 15 MG PO TABS: 15.0000 mg | ORAL_TABLET | Freq: Every day | ORAL | 1 refills | Status: DC

## 2022-05-27 MED ORDER — METOPROLOL SUCCINATE ER 50 MG PO TB24: 50.0000 mg | ORAL_TABLET | Freq: Every day | ORAL | 1 refills | Status: DC

## 2022-05-27 MED ORDER — METFORMIN HCL 1000 MG PO TABS: 1000.0000 mg | ORAL_TABLET | Freq: Two times a day (BID) | ORAL | 1 refills | Status: DC

## 2022-05-27 MED ORDER — LISINOPRIL 40 MG PO TABS: 40.0000 mg | ORAL_TABLET | Freq: Every day | ORAL | 1 refills | Status: DC

## 2022-05-27 MED ORDER — EZETIMIBE 10 MG PO TABS: 10.0000 mg | ORAL_TABLET | Freq: Every day | ORAL | 1 refills | Status: DC

## 2022-05-27 NOTE — Progress Notes (Signed)
Date:  05/27/2022   Name:  Ann Chaney   DOB:  September 27, 1945   MRN:  [id number]   Chief Complaint: Hypertension, Hyperlipidemia, Diabetes, and Insomnia  Hypertension This is a chronic problem. The current episode started more than 1 year ago. The problem has been gradually improving since onset. Pertinent negatives include no anxiety, blurred vision, chest pain, headaches, malaise/fatigue, neck pain, orthopnea, palpitations, peripheral edema, PND, shortness of breath or sweats. There are no associated agents to hypertension. There are no known risk factors for coronary artery disease. Past treatments include ACE inhibitors and beta blockers. The current treatment provides moderate improvement. There are no compliance problems.  There is no history of angina or CVA.  Hyperlipidemia This is a chronic problem. The current episode started more than 1 year ago. The problem is controlled. She has no history of diabetes, hypothyroidism or obesity. Pertinent negatives include no chest pain or shortness of breath. Current antihyperlipidemic treatment includes statins. The current treatment provides moderate improvement of lipids. There are no compliance problems.   Diabetes She presents for her follow-up diabetic visit. She has type 2 diabetes mellitus. Her disease course has been stable. Pertinent negatives for hypoglycemia include no headaches, nervousness/anxiousness or sweats. Pertinent negatives for diabetes include no blurred vision, no chest pain, no polydipsia and no polyuria. There are no hypoglycemic complications. Symptoms are stable. Pertinent negatives for diabetic complications include no CVA. Current diabetic treatment includes oral agent (dual therapy). She is following a generally healthy diet. Meal planning includes avoidance of concentrated sweets. Her home blood glucose trend is fluctuating minimally. An ACE inhibitor/angiotensin II receptor blocker is being taken.  Insomnia Primary  symptoms: no difficulty falling asleep, no malaise/fatigue.   The onset quality is gradual. The problem has been gradually improving since onset. Past treatments include medication (trazadone). The treatment provided moderate relief.    Lab Results  Component Value Date   NA 140 12/05/2021   K 4.4 12/05/2021   CO2 24 12/05/2021   GLUCOSE 103 (H) 12/05/2021   BUN 13 12/05/2021   CREATININE 0.80 12/05/2021   CALCIUM 10.0 12/05/2021   EGFR 77 12/05/2021   GFRNONAA >60 04/12/2021   Lab Results  Component Value Date   CHOL 193 12/05/2021   HDL 51 12/05/2021   LDLCALC 118 (H) 12/05/2021   TRIG 137 12/05/2021   CHOLHDL 6.0 (H) 10/01/2017   Lab Results  Component Value Date   TSH 0.763 07/01/2017   Lab Results  Component Value Date   HGBA1C 6.7 (H) 12/05/2021   Lab Results  Component Value Date   WBC 9.0 04/12/2021   HGB 12.9 08/21/2021   HCT 38.6 04/12/2021   MCV 75.4 (L) 04/12/2021   PLT 322 04/12/2021   Lab Results  Component Value Date   ALT 13 12/05/2021   AST 17 12/05/2021   ALKPHOS 101 12/05/2021   BILITOT 0.4 12/05/2021   Lab Results  Component Value Date   VD25OH 31.1 06/24/2016     Review of Systems  Constitutional:  Negative for fever and malaise/fatigue.  HENT:  Negative for trouble swallowing.   Eyes:  Negative for blurred vision.  Respiratory:  Negative for chest tightness, shortness of breath and wheezing.   Cardiovascular:  Negative for chest pain, palpitations, orthopnea and PND.  Gastrointestinal:  Negative for abdominal distention, abdominal pain and blood in stool.  Endocrine: Negative for polydipsia and polyuria.  Musculoskeletal:  Negative for neck pain.  Neurological:  Negative for headaches.  Psychiatric/Behavioral:  The patient has insomnia. The patient is not nervous/anxious.     Patient Active Problem List   Diagnosis Date Noted   Gastroesophageal reflux disease 04/03/2021   Diabetes mellitus due to underlying condition,  uncontrolled, with hyperglycemia ([place]) 04/03/2021   Constipation 02/13/2021   S/P resection of meningioma 01/23/2021   Meningioma ([place]) 01/14/2021   Drug-induced myopathy 09/23/2019   Multinodular goiter 04/16/2018   Mixed diabetic hyperlipidemia associated with type 2 diabetes mellitus ([place]) 11/10/2017   Tobacco dependence 11/10/2017   Microcytosis 11/17/2016   Depression, major, single episode, complete remission ([place]) 11/17/2016   Insomnia 07/22/2016   Hyperlipidemia 07/22/2016   Type 2 diabetes mellitus with hyperglycemia, without long-term current use of insulin ([place]) 06/24/2016   Dental abscess 08/27/2015   Smoker 11/04/2013   Goiter 01/26/2012   Essential hypertension 12/17/2009    Allergies  Allergen Reactions   Atorvastatin Other (See Comments)    Muscle pain   Pravastatin Sodium Nausea Only    Past Surgical History:  Procedure Laterality Date   ABDOMINAL HYSTERECTOMY     CRANIOTOMY Left 01/14/2021   Procedure: Craniotomy - left - Frontal - Tumor;  Surgeon: Earnie Larsson, MD;  Location: [facility];  Service: Neurosurgery;  Laterality: Left;   HYSTEROTOMY      Social History   Tobacco Use   Smoking status: Former    Packs/day: 0.50    Types: Cigarettes    Quit date: 07/31/2020    Years since quitting: 1.8   Smokeless tobacco: Never  Vaping Use   Vaping Use: Never used  Substance Use Topics   Alcohol use: No   Drug use: No     Medication list has been reviewed and updated.  Current Meds  Medication Sig   ACCU-CHEK AVIVA PLUS test strip 1 each daily.   Accu-Chek Softclix Lancets lancets    acetaminophen (TYLENOL) 500 MG tablet Take 500 mg by mouth every 6 (six) hours as needed (pain).   ezetimibe (ZETIA) 10 MG tablet Take 1 tablet (10 mg total) by mouth daily.   latanoprost (XALATAN) 0.005 % ophthalmic solution Place 1 drop into both eyes at bedtime.   lisinopril (ZESTRIL) 40 MG tablet TAKE 1 TABLET BY MOUTH DAILY   melatonin 3 MG TABS tablet Take 1 tablet  (3 mg total) by mouth at bedtime.   metFORMIN (GLUCOPHAGE) 1000 MG tablet Take 1 tablet (1,000 mg total) by mouth 2 (two) times daily with a meal.   methimazole (TAPAZOLE) 5 MG tablet Take 0.5 tablets (2.5 mg total) by mouth in the morning.   metoprolol succinate (TOPROL-XL) 50 MG 24 hr tablet Take 1 tablet (50 mg total) by mouth daily. Take with or immediately following a meal.   Multiple Vitamin (MULTIVITAMIN WITH MINERALS) TABS tablet Take 1 tablet by mouth daily.   pioglitazone (ACTOS) 15 MG tablet Take 1 tablet (15 mg total) by mouth daily.   rosuvastatin (CRESTOR) 5 MG tablet Take 1 tablet (5 mg total) by mouth once a week for 12 days.   traZODone (DESYREL) 50 MG tablet Take 0.5 tablets (25 mg total) by mouth at bedtime.   [DISCONTINUED] ferrous sulfate 325 (65 FE) MG tablet Take 1 tablet (325 mg total) by mouth daily with breakfast.       05/27/2022    9:17 AM 03/11/2022   10:02 AM 12/05/2021   11:06 AM 08/21/2021   10:54 AM  GAD 7 : Generalized Anxiety Score  Nervous, Anxious, on Edge 0 0 0 0  Control/stop worrying  0 0 0 0  Worry too much - different things 0 0 0 0  Trouble relaxing 0 0 0 0  Restless 0 0 0 0  Easily annoyed or irritable 0 0 0 0  Afraid - awful might happen 0 0 0 0  Total GAD 7 Score 0 0 0 0  Anxiety Difficulty Not difficult at all Not difficult at all Not difficult at all Not difficult at all       05/27/2022    9:17 AM 03/11/2022   10:01 AM 12/05/2021   11:06 AM  Depression screen PHQ 2/9  Decreased Interest 0 0 0  Down, Depressed, Hopeless 0 0 0  PHQ - 2 Score 0 0 0  Altered sleeping 0 0 0  Tired, decreased energy 0 0 0  Change in appetite 0 0 0  Feeling bad or failure about yourself  0 0 0  Trouble concentrating 0 0 0  Moving slowly or fidgety/restless 0 0 0  Suicidal thoughts 0 0 0  PHQ-9 Score 0 0 0  Difficult doing work/chores Not difficult at all Not difficult at all Not difficult at all    BP Readings from Last 3 Encounters:  05/27/22 120/78   03/11/22 120/78  12/05/21 116/64    Physical Exam Vitals and nursing note reviewed. Exam conducted with a chaperone present.  Constitutional:      General: She is not in acute distress.    Appearance: She is not diaphoretic.  HENT:     Head: Normocephalic and atraumatic.     Right Ear: External ear normal.     Left Ear: External ear normal.     Nose: Nose normal. No congestion or rhinorrhea.     Mouth/Throat:     Mouth: Mucous membranes are moist.  Eyes:     General:        Right eye: No discharge.        Left eye: No discharge.     Conjunctiva/sclera: Conjunctivae normal.     Pupils: Pupils are equal, round, and reactive to light.  Neck:     Thyroid: No thyromegaly.     Vascular: No JVD.  Cardiovascular:     Rate and Rhythm: Normal rate and regular rhythm.     Heart sounds: Normal heart sounds. No murmur heard.    No friction rub. No gallop.  Pulmonary:     Effort: Pulmonary effort is normal.     Breath sounds: Normal breath sounds. No wheezing or rhonchi.  Abdominal:     General: Bowel sounds are normal.     Palpations: Abdomen is soft. There is no mass.     Tenderness: There is no abdominal tenderness. There is no guarding.  Musculoskeletal:        General: Normal range of motion.     Cervical back: Normal range of motion and neck supple.  Lymphadenopathy:     Cervical: No cervical adenopathy.  Skin:    General: Skin is warm and dry.  Neurological:     Mental Status: She is alert.     Deep Tendon Reflexes: Reflexes are normal and symmetric.     Wt Readings from Last 3 Encounters:  05/27/22 137 lb (62.1 kg)  03/11/22 135 lb (61.2 kg)  12/05/21 133 lb (60.3 kg)    BP 120/78   Pulse 80   Ht '5\' 1"'$  (1.549 m)   Wt 137 lb (62.1 kg)   SpO2 98%   BMI 25.89 kg/m   Assessment  and Plan:  1. Essential hypertension Chronic.  Controlled.  Stable.  Blood pressure 120/78.  Asymptomatic.  Tolerating medications well.  Continue lisinopril 40 mg once a day and  metoprolol XL 50 mg once a day.  Will check renal function panel. - lisinopril (ZESTRIL) 40 MG tablet; Take 1 tablet (40 mg total) by mouth daily.  Dispense: 90 tablet; Refill: 1 - metoprolol succinate (TOPROL-XL) 50 MG 24 hr tablet; Take 1 tablet (50 mg total) by mouth daily. Take with or immediately following a meal.  Dispense: 90 tablet; Refill: 1 - Renal Function Panel  2. Diabetes mellitus due to underlying condition, uncontrolled, with hyperglycemia ([place]) Chronic.  Controlled.  Stable.  Continue pioglitazone 15 mg once a day and metformin 1 g twice a day.  Will check A1c and microalbuminuria for current status of control. - pioglitazone (ACTOS) 15 MG tablet; Take 1 tablet (15 mg total) by mouth daily.  Dispense: 90 tablet; Refill: 1 - Microalbumin / creatinine urine ratio - HgB A1c  3. Hyperlipidemia, unspecified hyperlipidemia type Chronic.  Controlled.  Stable.  Continue Zetia 10 mg once a day and Crestor 5 mg once a week.  Will check lipid panel for current level of control. - ezetimibe (ZETIA) 10 MG tablet; Take 1 tablet (10 mg total) by mouth daily.  Dispense: 90 tablet; Refill: 1 - rosuvastatin (CRESTOR) 5 MG tablet; Take 1 tablet (5 mg total) by mouth once a week.  Dispense: 12 tablet; Refill: 1 - Lipid Panel With LDL/HDL Ratio  4. Anemia, unspecified type Chronic.  Controlled.  Stable.  Continue ferrous sulfate 325 mg daily. - ferrous sulfate 325 (65 FE) MG tablet; Take 1 tablet (325 mg total) by mouth daily with breakfast.  Dispense: 30 tablet; Refill: 5  5. Insomnia, unspecified type Chronic.  Controlled.  Stable.  Continue trazodone 50 mg 1/2 tablet nightly. - traZODone (DESYREL) 50 MG tablet; Take 0.5 tablets (25 mg total) by mouth at bedtime.  Dispense: 45 tablet; Refill: 1  6. Statin myopathy Patient has an intolerance to statins but is able to take Zetia along with rosuvastatin 5 mg once a week. - ezetimibe (ZETIA) 10 MG tablet; Take 1 tablet (10 mg total) by mouth  daily.  Dispense: 90 tablet; Refill: 1    Otilio Miu, MD

## 2022-05-28 LAB — MICROALBUMIN / CREATININE URINE RATIO
Creatinine, Urine: 89.7 mg/dL
Microalb/Creat Ratio: 6 mg/g creat (ref 0–29)
Microalbumin, Urine: 5.2 ug/mL

## 2022-05-28 LAB — LIPID PANEL WITH LDL/HDL RATIO
Cholesterol, Total: 152 mg/dL (ref 100–199)
HDL: 44 mg/dL (ref >39)
LDL Chol Calc (NIH): 89 mg/dL (ref 0–99)
LDL/HDL Ratio: 2 ratio (ref 0.0–3.2)
Triglycerides: 105 mg/dL (ref 0–149)
VLDL Cholesterol Cal: 19 mg/dL (ref 5–40)

## 2022-05-28 LAB — RENAL FUNCTION PANEL
Albumin: 4.3 g/dL (ref 3.8–4.8)
BUN/Creatinine Ratio: 15 (ref 12–28)
BUN: 12 mg/dL (ref 8–27)
CO2: 22 mmol/L (ref 20–29)
Calcium: 9.7 mg/dL (ref 8.7–10.3)
Chloride: 108 mmol/L — ABNORMAL HIGH (ref 96–106)
Creatinine, Ser: 0.8 mg/dL (ref 0.57–1.00)
Glucose: 152 mg/dL — ABNORMAL HIGH (ref 70–99)
Phosphorus: 3.4 mg/dL (ref 3.0–4.3)
Potassium: 4.4 mmol/L (ref 3.5–5.2)
Sodium: 143 mmol/L (ref 134–144)
eGFR: 77 mL/min/{1.73_m2} (ref >59)

## 2022-05-28 LAB — HEMOGLOBIN A1C
Est. average glucose Bld gHb Est-mCnc: 151 mg/dL
Hgb A1c MFr Bld: 6.9 % — ABNORMAL HIGH (ref 4.8–5.6)

## 2022-06-02 ENCOUNTER — Ambulatory Visit: Payer: Medicare Other | Admitting: Family Medicine

## 2022-06-03 DIAGNOSIS — E113293 Type 2 diabetes mellitus with mild nonproliferative diabetic retinopathy without macular edema, bilateral: Secondary | ICD-10-CM | POA: Diagnosis not present

## 2022-06-03 DIAGNOSIS — H2513 Age-related nuclear cataract, bilateral: Secondary | ICD-10-CM | POA: Diagnosis not present

## 2022-06-03 DIAGNOSIS — H401132 Primary open-angle glaucoma, bilateral, moderate stage: Secondary | ICD-10-CM | POA: Diagnosis not present

## 2022-06-17 DIAGNOSIS — E0590 Thyrotoxicosis, unspecified without thyrotoxic crisis or storm: Secondary | ICD-10-CM | POA: Diagnosis not present

## 2022-06-17 DIAGNOSIS — E119 Type 2 diabetes mellitus without complications: Secondary | ICD-10-CM | POA: Diagnosis not present

## 2022-06-17 LAB — HEMOGLOBIN A1C: Hemoglobin A1C: 6.9

## 2022-06-17 LAB — LIPID PANEL
Cholesterol: 166 (ref 0–200)
HDL: 48 (ref 35–70)
LDL Cholesterol: 99
Triglycerides: 92 (ref 40–160)

## 2022-06-17 LAB — TSH: TSH: 1.1 (ref 0.41–5.90)

## 2022-06-17 LAB — BASIC METABOLIC PANEL
BUN: 13 (ref 4–21)
Creatinine: 0.7 (ref 0.5–1.1)
Glucose: 135
Potassium: 4.4 meq/L (ref 3.5–5.1)
Sodium: 140 (ref 137–147)

## 2022-06-17 LAB — MICROALBUMIN / CREATININE URINE RATIO: Microalb Creat Ratio: <7.9

## 2022-06-17 LAB — COMPREHENSIVE METABOLIC PANEL: eGFR: 90

## 2022-06-17 LAB — HM DIABETES FOOT EXAM: HM Diabetic Foot Exam: NORMAL

## 2022-06-24 DIAGNOSIS — E1159 Type 2 diabetes mellitus with other circulatory complications: Secondary | ICD-10-CM | POA: Diagnosis not present

## 2022-06-24 DIAGNOSIS — E0590 Thyrotoxicosis, unspecified without thyrotoxic crisis or storm: Secondary | ICD-10-CM | POA: Diagnosis not present

## 2022-06-24 DIAGNOSIS — I152 Hypertension secondary to endocrine disorders: Secondary | ICD-10-CM | POA: Diagnosis not present

## 2022-06-24 DIAGNOSIS — E119 Type 2 diabetes mellitus without complications: Secondary | ICD-10-CM | POA: Diagnosis not present

## 2022-06-24 DIAGNOSIS — E1169 Type 2 diabetes mellitus with other specified complication: Secondary | ICD-10-CM | POA: Diagnosis not present

## 2022-06-24 DIAGNOSIS — E782 Mixed hyperlipidemia: Secondary | ICD-10-CM | POA: Diagnosis not present

## 2022-06-24 DIAGNOSIS — E042 Nontoxic multinodular goiter: Secondary | ICD-10-CM | POA: Diagnosis not present

## 2022-07-23 ENCOUNTER — Telehealth: Payer: Self-pay | Admitting: Family Medicine

## 2022-07-23 NOTE — Telephone Encounter (Signed)
Contacted Ann Chaney to schedule their annual wellness visit. Appointment made for 08/20/2022.  Verlee Rossetti; Care Guide Ambulatory Clinical Support [place] [facility] Direct Dial: [phone]

## 2022-07-31 ENCOUNTER — Other Ambulatory Visit: Payer: Self-pay | Admitting: Family Medicine

## 2022-07-31 DIAGNOSIS — E0865 Diabetes mellitus due to underlying condition with hyperglycemia: Secondary | ICD-10-CM

## 2022-08-01 NOTE — Telephone Encounter (Signed)
Refill for mail order   Requested Prescriptions  Pending Prescriptions Disp Refills   pioglitazone (ACTOS) 15 MG tablet [Pharmacy Med Name: Pioglitazone HCl 15 MG Oral Tablet] 100 tablet 0    Sig: TAKE 1 TABLET BY MOUTH DAILY     Endocrinology:  Diabetes - Glitazones - pioglitazone Passed - 07/31/2022 10:25 PM      Passed - HBA1C is between 0 and 7.9 and within 180 days    Hemoglobin A1C  Date Value Ref Range Status  05/08/2020 5.7  Final   Hgb A1c MFr Bld  Date Value Ref Range Status  05/27/2022 6.9 (H) 4.8 - 5.6 % Final    Comment:             Prediabetes: 5.7 - 6.4          Diabetes: >6.4          Glycemic control for adults with diabetes: <7.0          Passed - Valid encounter within last 6 months    Recent Outpatient Visits           2 months ago Essential hypertension   [place] [facility]   4 months ago Statin myopathy   [place] [facility]   7 months ago Essential hypertension   [place] [facility]   11 months ago Annual physical exam   [facility]   1 year ago Diabetes mellitus due to underlying condition, uncontrolled, with hyperglycemia [facility])   [place] [facility]       Future Appointments             In 3 months Duanne Limerick, MD [facility] at [facility], [facility]

## 2022-08-04 ENCOUNTER — Other Ambulatory Visit: Payer: Self-pay | Admitting: Family Medicine

## 2022-08-04 DIAGNOSIS — T466X5A Adverse effect of antihyperlipidemic and antiarteriosclerotic drugs, initial encounter: Secondary | ICD-10-CM

## 2022-08-04 DIAGNOSIS — E785 Hyperlipidemia, unspecified: Secondary | ICD-10-CM

## 2022-08-04 NOTE — Telephone Encounter (Signed)
Request #[phone] for better insurance coverage Requested Prescriptions  Pending Prescriptions Disp Refills   ezetimibe (ZETIA) 10 MG tablet [Pharmacy Med Name: Ezetimibe 10 MG Oral Tablet] 100 tablet 2    Sig: TAKE 1 TABLET BY MOUTH DAILY     Cardiovascular:  Antilipid - Sterol Transport Inhibitors Failed - 08/04/2022  6:57 AM      Failed - Lipid Panel in normal range within the last 12 months    Cholesterol, Total  Date Value Ref Range Status  05/27/2022 152 100 - 199 mg/dL Final   LDL Chol Calc (NIH)  Date Value Ref Range Status  05/27/2022 89 0 - 99 mg/dL Final   HDL  Date Value Ref Range Status  05/27/2022 44 >39 mg/dL Final   Triglycerides  Date Value Ref Range Status  05/27/2022 105 0 - 149 mg/dL Final         Passed - AST in normal range and within 360 days    AST  Date Value Ref Range Status  12/05/2021 17 0 - 40 IU/L Final         Passed - ALT in normal range and within 360 days    ALT  Date Value Ref Range Status  12/05/2021 13 0 - 32 IU/L Final         Passed - Patient is not pregnant      Passed - Valid encounter within last 12 months    Recent Outpatient Visits           2 months ago Essential hypertension   [place] [facility]   4 months ago Statin myopathy   [place] [facility]   8 months ago Essential hypertension   [place] [facility]   11 months ago Annual physical exam   [place] [facility]   1 year ago Diabetes mellitus due to underlying condition, uncontrolled, with hyperglycemia (HCC)    [facility] at [facility], MD       Future Appointments             In 3 months Duanne Limerick, MD [facility]             rosuvastatin (CRESTOR) 5 MG tablet [Pharmacy Med Name: Rosuvastatin Calcium 5 MG Oral Tablet] 15 tablet 2    Sig: TAKE 1 TABLET BY MOUTH ONCE A  WEEK     Cardiovascular:  Antilipid - Statins 2 Failed - 08/04/2022  6:57 AM      Failed - Lipid Panel in normal range within the last 12 months    Cholesterol, Total  Date Value Ref Range Status  05/27/2022 152 100 - 199 mg/dL Final   LDL Chol Calc (NIH)  Date Value Ref Range Status  05/27/2022 89 0 - 99 mg/dL Final   HDL  Date Value Ref Range Status  05/27/2022 44 >39 mg/dL Final   Triglycerides  Date Value Ref Range Status  05/27/2022 105 0 - 149 mg/dL Final         Passed - Cr in normal range and within 360 days    Creatinine, Ser  Date Value Ref Range Status  05/27/2022 0.80  0.57 - 1.00 mg/dL Final         Passed - Patient is not pregnant      Passed - Valid encounter within last 12 months    Recent Outpatient Visits           2 months ago Essential hypertension   [place] [facility]   4 months ago Statin myopathy   [place] [facility]   8 months ago Essential hypertension   [place] [facility]   11 months ago Annual physical exam   [facility]   1 year ago Diabetes mellitus due to underlying condition, uncontrolled, with hyperglycemia [facility])   [place] [facility]       Future Appointments             In 3 months Duanne Limerick, MD [facility] at [facility], [facility]

## 2022-08-07 ENCOUNTER — Telehealth: Payer: Self-pay

## 2022-08-07 ENCOUNTER — Telehealth: Payer: Self-pay | Admitting: Family Medicine

## 2022-08-07 NOTE — Telephone Encounter (Signed)
Copied from CRM [id number]. Topic: General - Other >> Aug 07, 2022  9:38 AM Macon Large wrote: Reason for CRM: Pt requests a letter to excuse her from jury duty. Pt requests call back to advise when the letter is ready. Cb# [phone]

## 2022-08-07 NOTE — Telephone Encounter (Signed)
Called pt- explained to her to check the box that she is older than the maximum age. She will call me if any other problems

## 2022-08-20 ENCOUNTER — Ambulatory Visit (INDEPENDENT_AMBULATORY_CARE_PROVIDER_SITE_OTHER): Payer: Medicare Other

## 2022-08-20 VITALS — Ht 61.0 in | Wt 137.0 lb

## 2022-08-20 DIAGNOSIS — Z0000 Encounter for general adult medical examination without abnormal findings: Secondary | ICD-10-CM

## 2022-08-20 NOTE — Patient Instructions (Signed)
[name] , Thank you for taking time to come for your Medicare Wellness Visit. I appreciate your ongoing commitment to your health goals. Please review the following plan we discussed and let me know if I can assist you in the future.   These are the goals we discussed:  Goals      DIET - EAT MORE FRUITS AND VEGETABLES     Increase physical activity     Recommend increasing physical activity to 150 minutes per week.         This is a list of the screening recommended for you and due dates:  Health Maintenance  Topic Date Due   Zoster (Shingles) Vaccine (1 of 2) Never done   COVID-19 Vaccine (3 - Pfizer risk series) 07/05/2019   Stool Blood Test  06/15/2022   Complete foot exam   08/02/2022   Screening for Lung Cancer  03/12/2023*   Flu Shot  10/23/2022   Hemoglobin A1C  11/27/2022   Eye exam for diabetics  12/04/2022   Yearly kidney function blood test for diabetes  05/27/2023   Yearly kidney health urinalysis for diabetes  05/27/2023   Medicare Annual Wellness Visit  08/20/2023   Colon Cancer Screening  03/24/2026   Pneumonia Vaccine  Completed   DEXA scan (bone density measurement)  Completed   Hepatitis C Screening  Completed   HPV Vaccine  Aged Out   DTaP/Tdap/Td vaccine  Discontinued  *Topic was postponed. The date shown is not the original due date.    Advanced directives: no  Conditions/risks identified: none  Next appointment: Follow up in one year for your annual wellness visit 08/26/23 @ 11:15 am by phone   Preventive Care 65 Years and Older, Female Preventive care refers to lifestyle choices and visits with your health care provider that can promote health and wellness. What does preventive care include? A yearly physical exam. This is also called an annual well check. Dental exams once or twice a year. Routine eye exams. Ask your health care provider how often you should have your eyes checked. Personal lifestyle choices, including: Daily care of your  teeth and gums. Regular physical activity. Eating a healthy diet. Avoiding tobacco and drug use. Limiting alcohol use. Practicing safe sex. Taking low-dose aspirin every day. Taking vitamin and mineral supplements as recommended by your health care provider. What happens during an annual well check? The services and screenings done by your health care provider during your annual well check will depend on your age, overall health, lifestyle risk factors, and family history of disease. Counseling  Your health care provider may ask you questions about your: Alcohol use. Tobacco use. Drug use. Emotional well-being. Home and relationship well-being. Sexual activity. Eating habits. History of falls. Memory and ability to understand (cognition). Work and work Astronomer. Reproductive health. Screening  You may have the following tests or measurements: Height, weight, and BMI. Blood pressure. Lipid and cholesterol levels. These may be checked every 5 years, or more frequently if you are over 31 years old. Skin check. Lung cancer screening. You may have this screening every year starting at age 68 if you have a 30-pack-year history of smoking and currently smoke or have quit within the past 15 years. Fecal occult blood test (FOBT) of the stool. You may have this test every year starting at age 34. Flexible sigmoidoscopy or colonoscopy. You may have a sigmoidoscopy every 5 years or a colonoscopy every 10 years starting at age 46. Hepatitis C blood  test. Hepatitis B blood test. Sexually transmitted disease (STD) testing. Diabetes screening. This is done by checking your blood sugar (glucose) after you have not eaten for a while (fasting). You may have this done every 1-3 years. Bone density scan. This is done to screen for osteoporosis. You may have this done starting at age 64. Mammogram. This may be done every 1-2 years. Talk to your health care provider about how often you should have  regular mammograms. Talk with your health care provider about your test results, treatment options, and if necessary, the need for more tests. Vaccines  Your health care provider may recommend certain vaccines, such as: Influenza vaccine. This is recommended every year. Tetanus, diphtheria, and acellular pertussis (Tdap, Td) vaccine. You may need a Td booster every 10 years. Zoster vaccine. You may need this after age 51. Pneumococcal 13-valent conjugate (PCV13) vaccine. One dose is recommended after age 62. Pneumococcal polysaccharide (PPSV23) vaccine. One dose is recommended after age 63. Talk to your health care provider about which screenings and vaccines you need and how often you need them. This information is not intended to replace advice given to you by your health care provider. Make sure you discuss any questions you have with your health care provider. Document Released: 04/06/2015 Document Revised: 11/28/2015 Document Reviewed: 01/09/2015 Elsevier Interactive Patient Education  2017 [employer].  Fall Prevention in the Home Falls can cause injuries. They can happen to people of all ages. There are many things you can do to make your home safe and to help prevent falls. What can I do on the outside of my home? Regularly fix the edges of walkways and driveways and fix any cracks. Remove anything that might make you trip as you walk through a door, such as a raised step or threshold. Trim any bushes or trees on the path to your home. Use bright outdoor lighting. Clear any walking paths of anything that might make someone trip, such as rocks or tools. Regularly check to see if handrails are loose or broken. Make sure that both sides of any steps have handrails. Any raised decks and porches should have guardrails on the edges. Have any leaves, snow, or ice cleared regularly. Use sand or salt on walking paths during winter. Clean up any spills in your garage right away. This  includes oil or grease spills. What can I do in the bathroom? Use night lights. Install grab bars by the toilet and in the tub and shower. Do not use towel bars as grab bars. Use non-skid mats or decals in the tub or shower. If you need to sit down in the shower, use a plastic, non-slip stool. Keep the floor dry. Clean up any water that spills on the floor as soon as it happens. Remove soap buildup in the tub or shower regularly. Attach bath mats securely with double-sided non-slip rug tape. Do not have throw rugs and other things on the floor that can make you trip. What can I do in the bedroom? Use night lights. Make sure that you have a light by your bed that is easy to reach. Do not use any sheets or blankets that are too big for your bed. They should not hang down onto the floor. Have a firm chair that has side arms. You can use this for support while you get dressed. Do not have throw rugs and other things on the floor that can make you trip. What can I do in the kitchen? Clean  up any spills right away. Avoid walking on wet floors. Keep items that you use a lot in easy-to-reach places. If you need to reach something above you, use a strong step stool that has a grab bar. Keep electrical cords out of the way. Do not use floor polish or wax that makes floors slippery. If you must use wax, use non-skid floor wax. Do not have throw rugs and other things on the floor that can make you trip. What can I do with my stairs? Do not leave any items on the stairs. Make sure that there are handrails on both sides of the stairs and use them. Fix handrails that are broken or loose. Make sure that handrails are as long as the stairways. Check any carpeting to make sure that it is firmly attached to the stairs. Fix any carpet that is loose or worn. Avoid having throw rugs at the top or bottom of the stairs. If you do have throw rugs, attach them to the floor with carpet tape. Make sure that you  have a light switch at the top of the stairs and the bottom of the stairs. If you do not have them, ask someone to add them for you. What else can I do to help prevent falls? Wear shoes that: Do not have high heels. Have rubber bottoms. Are comfortable and fit you well. Are closed at the toe. Do not wear sandals. If you use a stepladder: Make sure that it is fully opened. Do not climb a closed stepladder. Make sure that both sides of the stepladder are locked into place. Ask someone to hold it for you, if possible. Clearly mark and make sure that you can see: Any grab bars or handrails. First and last steps. Where the edge of each step is. Use tools that help you move around (mobility aids) if they are needed. These include: Canes. Walkers. Scooters. Crutches. Turn on the lights when you go into a dark area. Replace any light bulbs as soon as they burn out. Set up your furniture so you have a clear path. Avoid moving your furniture around. If any of your floors are uneven, fix them. If there are any pets around you, be aware of where they are. Review your medicines with your doctor. Some medicines can make you feel dizzy. This can increase your chance of falling. Ask your doctor what other things that you can do to help prevent falls. This information is not intended to replace advice given to you by your health care provider. Make sure you discuss any questions you have with your health care provider. Document Released: 01/04/2009 Document Revised: 08/16/2015 Document Reviewed: 04/14/2014 Elsevier Interactive Patient Education  2017 [employer].

## 2022-08-20 NOTE — Progress Notes (Signed)
I connected with  Ann Chaney on 08/20/22 by a audio enabled telemedicine application and verified that I am speaking with the correct person using two identifiers.  Patient Location: Home  Provider Location: Office/Clinic  I discussed the limitations of evaluation and management by telemedicine. The patient expressed understanding and agreed to proceed.  Subjective:   Ann Chaney is a 77 y.o. female who presents for Medicare Annual (Subsequent) preventive examination.  Review of Systems     Cardiac Risk Factors include: advanced age (>59men, >14 women);diabetes mellitus;dyslipidemia;hypertension;sedentary lifestyle     Objective:    There were no vitals filed for this visit. There is no height or weight on file to calculate BMI.     08/20/2022   11:33 AM 08/12/2021   10:39 AM 04/12/2021    8:01 PM 01/23/2021    1:52 PM 01/14/2021    2:15 PM 01/11/2021    8:32 AM 08/08/2020   11:03 AM  Advanced Directives  Does Patient Have a Medical Advance Directive? No Yes No Yes Yes Yes No  Type of Special educational needs teacher of [place];Living will  Healthcare Power of [employer] Power of [employer] Power of [place];Living will   Does patient want to make changes to medical advance directive?    No - Patient declined No - Patient declined No - Patient declined   Copy of Healthcare Power of Attorney in Chart?  Yes - validated most recent copy scanned in chart (See row information)  No - copy requested No - copy requested No - copy requested   Would patient like information on creating a medical advance directive? No - Patient declined      Yes (MAU/Ambulatory/Procedural Areas - Information given)    Current Medications (verified) Outpatient Encounter Medications as of 08/20/2022  Medication Sig   ACCU-CHEK AVIVA PLUS test strip 1 each daily.   Accu-Chek Softclix Lancets lancets    acetaminophen (TYLENOL) 500 MG tablet Take 500 mg by mouth every 6 (six)  hours as needed (pain).   ezetimibe (ZETIA) 10 MG tablet TAKE 1 TABLET BY MOUTH DAILY   ferrous sulfate 325 (65 FE) MG tablet Take 1 tablet (325 mg total) by mouth daily with breakfast.   latanoprost (XALATAN) 0.005 % ophthalmic solution Place 1 drop into both eyes at bedtime.   lisinopril (ZESTRIL) 40 MG tablet Take 1 tablet (40 mg total) by mouth daily.   metFORMIN (GLUCOPHAGE) 1000 MG tablet Take 1 tablet (1,000 mg total) by mouth 2 (two) times daily with a meal.   methimazole (TAPAZOLE) 5 MG tablet Take 0.5 tablets (2.5 mg total) by mouth in the morning.   metoprolol succinate (TOPROL-XL) 50 MG 24 hr tablet Take 1 tablet (50 mg total) by mouth daily. Take with or immediately following a meal.   Multiple Vitamin (MULTIVITAMIN WITH MINERALS) TABS tablet Take 1 tablet by mouth daily.   pioglitazone (ACTOS) 15 MG tablet TAKE 1 TABLET BY MOUTH DAILY   rosuvastatin (CRESTOR) 5 MG tablet TAKE 1 TABLET BY MOUTH ONCE A  WEEK   traZODone (DESYREL) 50 MG tablet Take 0.5 tablets (25 mg total) by mouth at bedtime.   melatonin 3 MG TABS tablet Take 1 tablet (3 mg total) by mouth at bedtime. (Patient not taking: Reported on 08/20/2022)   No facility-administered encounter medications on file as of 08/20/2022.    Allergies (verified) Atorvastatin and Pravastatin sodium   History: Past Medical History:  Diagnosis Date   Diabetes mellitus without complication (HCC)  Glaucoma    Glaucoma    Hyperlipidemia    Hypertension    Past Surgical History:  Procedure Laterality Date   ABDOMINAL HYSTERECTOMY     CRANIOTOMY Left 01/14/2021   Procedure: Craniotomy - left - Frontal - Tumor;  Surgeon: Julio Sicks, MD;  [facility];  Service: Neurosurgery;  Laterality: Left;   HYSTEROTOMY     Family History  Problem Relation Age of Onset   Cancer Mother        sternum   Emphysema Mother    Diabetes Mother    Glaucoma Father    Dementia Father    Breast cancer Maternal Aunt    Social History    Socioeconomic History   Marital status: Divorced    Spouse name: Not on file   Number of children: 1   Years of education: Not on file   Highest education level: Associate degree: academic program  Occupational History   Occupation: retired  Tobacco Use   Smoking status: Former    Packs/day: .5    Types: Cigarettes    Quit date: 07/31/2020    Years since quitting: 2.0   Smokeless tobacco: Never  Vaping Use   Vaping Use: Never used  Substance and Sexual Activity   Alcohol use: No   Drug use: No   Sexual activity: Not Currently  Other Topics Concern   Not on file  Social History Narrative   Not on file   Social Determinants of Health   Financial Resource Strain: Low Risk  (08/20/2022)   Overall Financial Resource Strain (CARDIA)    Difficulty of Paying Living Expenses: Not hard at all  Food Insecurity: No Food Insecurity (08/20/2022)   Hunger Vital Sign    Worried About Running Out of Food in the Last Year: Never true    Ran Out of Food in the Last Year: Never true  Transportation Needs: No Transportation Needs (08/20/2022)   PRAPARE - Administrator, Civil Service (Medical): No    Lack of Transportation (Non-Medical): No  Physical Activity: Insufficiently Active (08/20/2022)   Exercise Vital Sign    Days of Exercise per Week: 2 days    Minutes of Exercise per Session: 20 min  Stress: No Stress Concern Present (08/20/2022)   [employer] of Occupational Health - Occupational Stress Questionnaire    Feeling of Stress : Not at all  Social Connections: Unknown (08/20/2022)   Social Connection and Isolation Panel [NHANES]    Frequency of Communication with Friends and Family: Twice a week    Frequency of Social Gatherings with Friends and Family: Not on file    Attends Religious Services: Never    Database administrator or Organizations: No    Attends Engineer, structural: Never    Marital Status: Divorced    Tobacco Counseling Counseling  given: Not Answered   Clinical Intake:  Pre-visit preparation completed: Yes  Pain : No/denies pain     Nutritional Risks: None Diabetes: Yes CBG done?: No Did pt. bring in CBG monitor from home?: No  How often do you need to have someone help you when you read instructions, pamphlets, or other written materials from your doctor or pharmacy?: 1 - Never  Diabetic?yes Nutrition Risk Assessment:  Has the patient had any N/V/D within the last 2 months?  No  Does the patient have any non-healing wounds?  No  Has the patient had any unintentional weight loss or weight gain?  No  Diabetes:  Is the patient diabetic?  Yes  If diabetic, was a CBG obtained today?  No  Did the patient bring in their glucometer from home?  No  How often do you monitor your CBG's? Once per day.   Financial Strains and Diabetes Management:  Are you having any financial strains with the device, your supplies or your medication? No .  Does the patient want to be seen by Chronic Care Management for management of their diabetes?  No  Would the patient like to be referred to a Nutritionist or for Diabetic Management?  No   Diabetic Exams:  Diabetic Eye Exam: Completed 12/03/21.  Pt has been advised about the importance in completing this exam.  Diabetic Foot Exam: Completed 08/01/21. Pt has been advised about the importance in completing this exam.   Interpreter Needed?: No  Information entered by :: Kennedy Bucker, LPN   Activities of Daily Living    08/20/2022   11:34 AM  In your present state of health, do you have any difficulty performing the following activities:  Hearing? 0  Vision? 0  Difficulty concentrating or making decisions? 0  Walking or climbing stairs? 0  Dressing or bathing? 0  Doing errands, shopping? 0  Preparing Food and eating ? N  Using the Toilet? N  In the past six months, have you accidently leaked urine? N  Do you have problems with loss of bowel control? N  Managing  your Medications? N  Managing your Finances? N  Housekeeping or managing your Housekeeping? N    Patient Care Team: Duanne Limerick, MD as PCP - General (Family Medicine)  Indicate any recent Medical Services you may have received from other than [facility] providers in the past year (date may be approximate).     Assessment:   This is a routine wellness examination for Evelean.  Hearing/Vision screen Hearing Screening - Comments:: No aids Vision Screening - Comments:: Readers - Dr.Woodard  Dietary issues and exercise activities discussed: Current Exercise Habits: Home exercise routine, Type of exercise: walking, Time (Minutes): 20, Frequency (Times/Week): 2, Weekly Exercise (Minutes/Week): 40, Intensity: Mild   Goals Addressed             This Visit's Progress    DIET - EAT MORE FRUITS AND VEGETABLES         Depression Screen    08/20/2022   11:32 AM 05/27/2022    9:17 AM 03/11/2022   10:01 AM 12/05/2021   11:06 AM 08/21/2021   10:53 AM 08/12/2021   10:38 AM 08/01/2021   10:46 AM  PHQ 2/9 Scores  PHQ - 2 Score 0 0 0 0 0 0 0  PHQ- 9 Score 0 0 0 0 0  0    Fall Risk    08/20/2022   11:34 AM 05/27/2022    9:17 AM 03/11/2022   10:01 AM 12/05/2021   11:06 AM 08/12/2021   10:39 AM  Fall Risk   Falls in the past year? 0 0 0 0 0  Number falls in past yr: 0 0 0 0 0  Injury with Fall? 0 0 0 0 0  Risk for fall due to : No Fall Risks No Fall Risks No Fall Risks No Fall Risks No Fall Risks  Follow up Falls prevention discussed;Falls evaluation completed Falls evaluation completed Falls evaluation completed Falls evaluation completed Falls prevention discussed    FALL RISK PREVENTION PERTAINING TO THE HOME:  Any stairs in or around the home? No  If so, are there any without handrails? No  Home free of loose throw rugs in walkways, pet beds, electrical cords, etc? Yes  Adequate lighting in your home to reduce risk of falls? Yes   ASSISTIVE DEVICES UTILIZED TO PREVENT FALLS:  Life  alert? No  Use of a cane, walker or w/c? No  Grab bars in the bathroom? No  Shower chair or bench in shower? No  Elevated toilet seat or a handicapped toilet? No    Cognitive Function:        08/20/2022   11:39 AM 07/16/2020    4:15 PM 08/01/2019    2:47 PM 03/29/2018    3:12 PM  6CIT Screen  What Year? 0 points 0 points 0 points 0 points  What month? 0 points 0 points 0 points 0 points  What time? 0 points 0 points 0 points 0 points  Count back from 20 4 points 4 points 0 points 0 points  Months in reverse 4 points 4 points 0 points 2 points  Repeat phrase 2 points 0 points 0 points 0 points  Total Score 10 points 8 points 0 points 2 points    Immunizations Immunization History  Administered Date(s) Administered   Fluad Quad(high Dose 65+) 02/24/2019, 02/09/2020, 11/28/2020, 12/05/2021   Influenza, High Dose Seasonal PF 12/30/2016, 03/29/2018   Influenza, Seasonal, Injecte, Preservative Fre 12/11/2011   Influenza,inj,Quad PF,6+ Mos 12/13/2013, 02/01/2015, 01/24/2016   PFIZER(Purple Top)SARS-COV-2 Vaccination 05/17/2019, 06/07/2019   Pneumococcal Conjugate-13 10/07/2013   Pneumococcal Polysaccharide-23 10/24/2014   Tdap 09/11/2009    TDAP status: Due, Education has been provided regarding the importance of this vaccine. Advised may receive this vaccine at local pharmacy or Health Dept. Aware to provide a copy of the vaccination record if obtained from local pharmacy or Health Dept. Verbalized acceptance and understanding.  Flu Vaccine status: Up to date  Pneumococcal vaccine status: Up to date  Covid-19 vaccine status: Completed vaccines  Qualifies for Shingles Vaccine? Yes   Zostavax completed No   Shingrix Completed?: No.    Education has been provided regarding the importance of this vaccine. Patient has been advised to call insurance company to determine out of pocket expense if they have not yet received this vaccine. Advised may also receive vaccine at local pharmacy  or Health Dept. Verbalized acceptance and understanding.  Screening Tests Health Maintenance  Topic Date Due   Zoster Vaccines- Shingrix (1 of 2) Never done   COVID-19 Vaccine (3 - Pfizer risk series) 07/05/2019   COLON CANCER SCREENING ANNUAL FOBT  06/15/2022   FOOT EXAM  08/02/2022   Lung Cancer Screening  03/12/2023 (Originally 12/09/1996)   INFLUENZA VACCINE  10/23/2022   HEMOGLOBIN A1C  11/27/2022   OPHTHALMOLOGY EXAM  12/04/2022   Diabetic kidney evaluation - eGFR measurement  05/27/2023   Diabetic kidney evaluation - Urine ACR  05/27/2023   Medicare Annual Wellness (AWV)  08/20/2023   Colonoscopy  03/24/2026   Pneumonia Vaccine 63+ Years old  Completed   DEXA SCAN  Completed   Hepatitis C Screening  Completed   HPV VACCINES  Aged Out   DTaP/Tdap/Td  Discontinued    Health Maintenance  Health Maintenance Due  Topic Date Due   Zoster Vaccines- Shingrix (1 of 2) Never done   COVID-19 Vaccine (3 - Pfizer risk series) 07/05/2019   COLON CANCER SCREENING ANNUAL FOBT  06/15/2022   FOOT EXAM  08/02/2022    Colorectal cancer screening: Type of screening: FOBT/FIT. Completed 06/14/21. Repeat every  1 years  Mammogram status: No longer required due to age.  Bone Density status: Completed 09/20/20. Results reflect: Bone density results: OSTEOPOROSIS. Repeat every 2 years.- declined referral  Lung Cancer Screening: (Low Dose CT Chest recommended if Age 28-80 years, 30 pack-year currently smoking OR have quit w/in 15years.) does not qualify.   Additional Screening:  Hepatitis C Screening: does qualify; Completed 12/19/20  Vision Screening: Recommended annual ophthalmology exams for early detection of glaucoma and other disorders of the eye. Is the patient up to date with their annual eye exam?  Yes  Who is the provider or what is the name of the office in which the patient attends annual eye exams? Dr.Woodard If pt is not established with a provider, would they like to be  referred to a provider to establish care? No .   Dental Screening: Recommended annual dental exams for proper oral hygiene  Community Resource Referral / Chronic Care Management: CRR required this visit?  No   CCM required this visit?  No      Plan:     I have personally reviewed and noted the following in the patient's chart:   Medical and social history Use of alcohol, tobacco or illicit drugs  Current medications and supplements including opioid prescriptions. Patient is not currently taking opioid prescriptions. Functional ability and status Nutritional status Physical activity Advanced directives List of other physicians Hospitalizations, surgeries, and ER visits in previous 12 months Vitals Screenings to include cognitive, depression, and falls Referrals and appointments  In addition, I have reviewed and discussed with patient certain preventive protocols, quality metrics, and best practice recommendations. A written personalized care plan for preventive services as well as general preventive health recommendations were provided to patient.     Hal Hope, LPN   1/61/0960   Nurse Notes: none

## 2022-08-21 ENCOUNTER — Other Ambulatory Visit: Payer: Self-pay | Admitting: Family Medicine

## 2022-08-21 DIAGNOSIS — I10 Essential (primary) hypertension: Secondary | ICD-10-CM

## 2022-09-09 IMAGING — MG MM DIGITAL SCREENING BILAT W/ TOMO AND CAD
8 series · 8 of 24 positions shown · non-contrast
Comparison: Previous exam(s).

CLINICAL DATA: Screening.

EXAM:
DIGITAL SCREENING BILATERAL MAMMOGRAM WITH TOMOSYNTHESIS AND CAD
TECHNIQUE: Bilateral screening digital craniocaudal and mediolateral oblique
mammograms were obtained. Bilateral screening digital breast
tomosynthesis was performed. The images were evaluated with
computer-aided detection.

[L CC synth-2D]
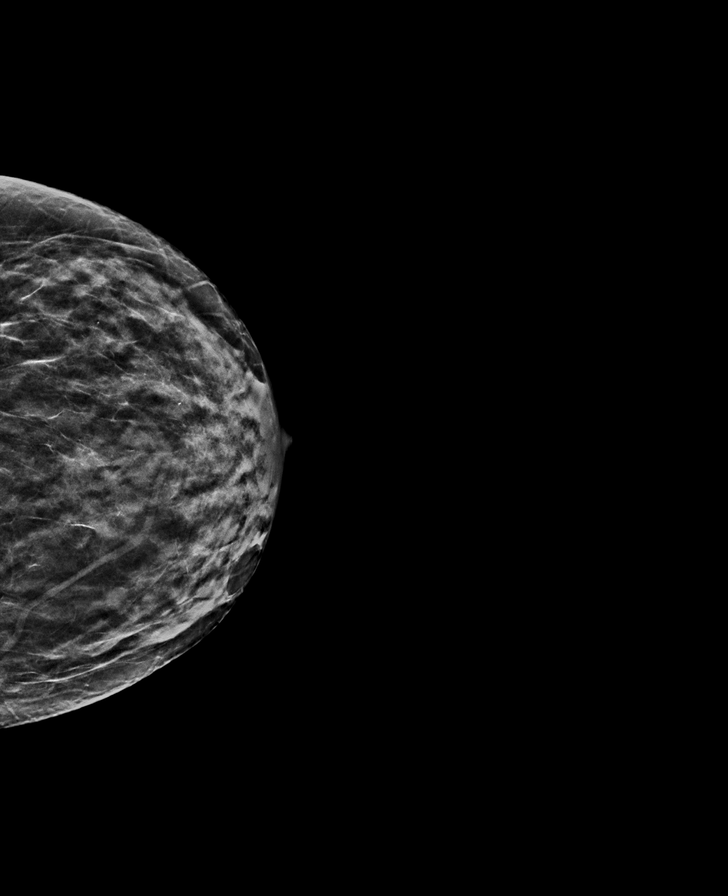

[L MLO synth-2D]
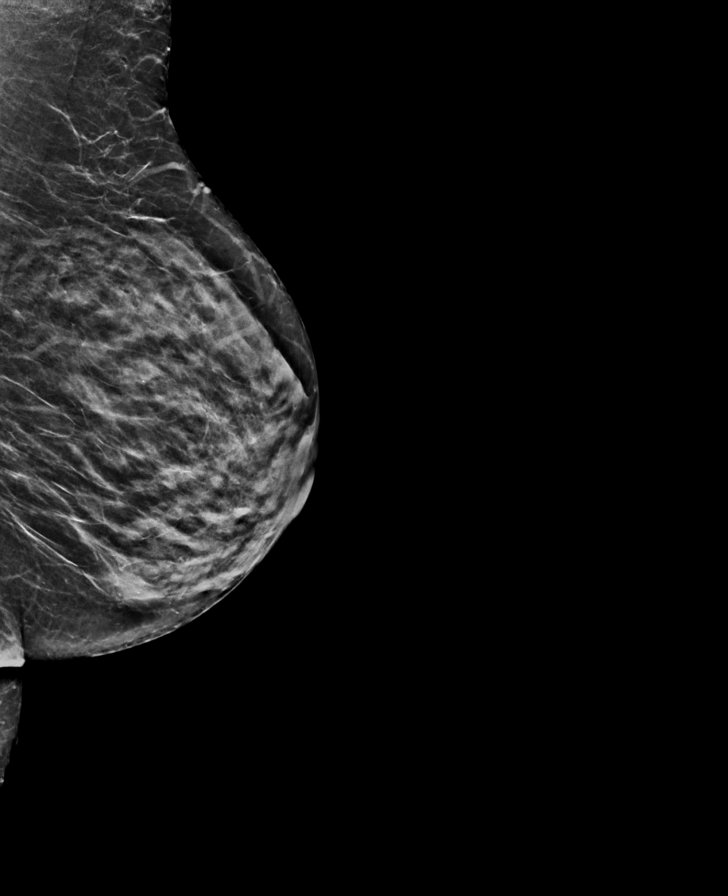

[R MLO synth-2D]
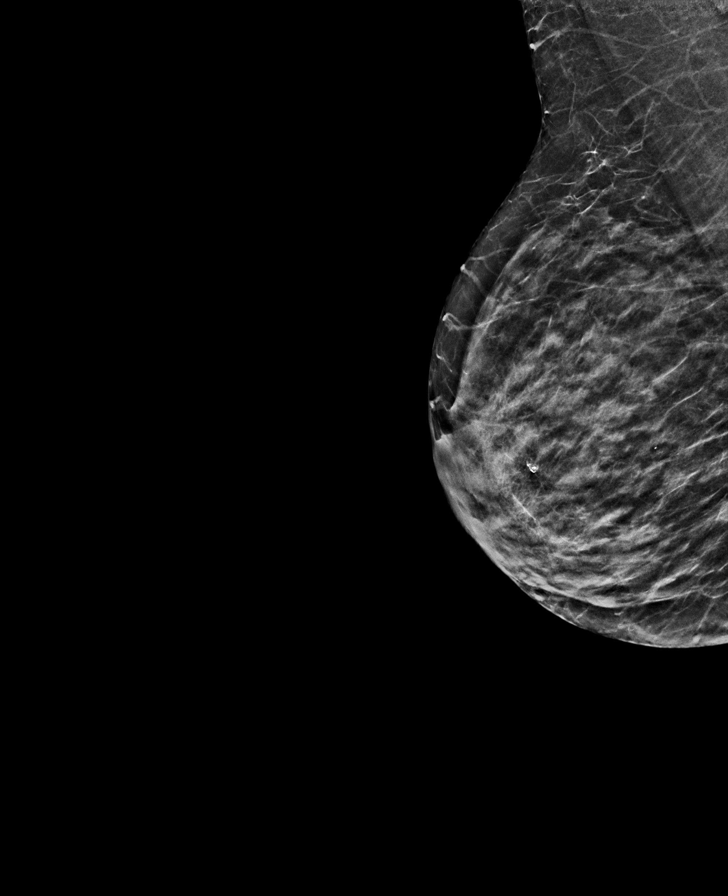

[R CC synth-2D]
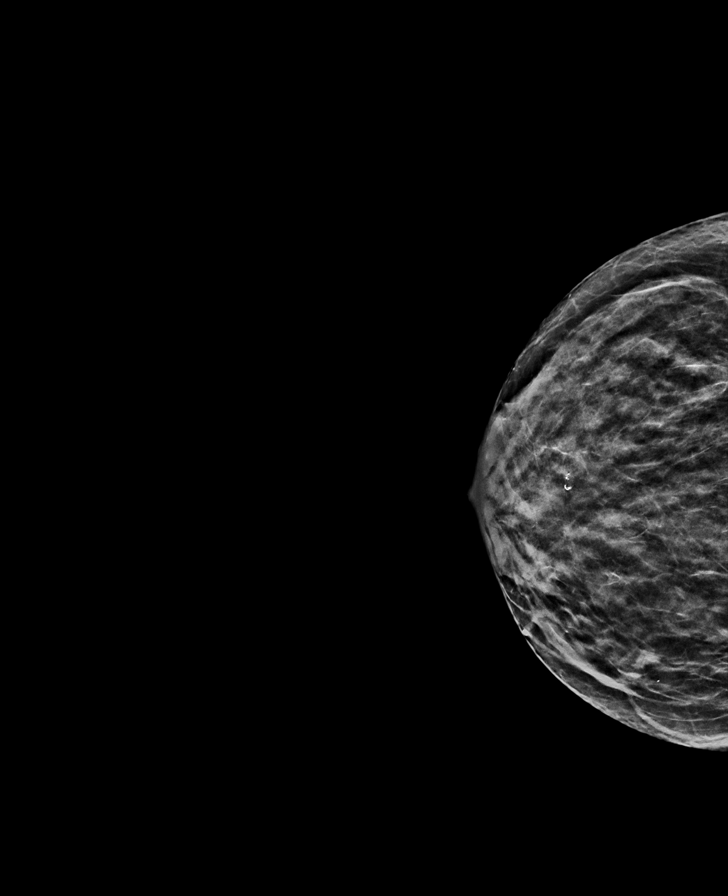

[L CC tomo · tomo slice 24/47.0]
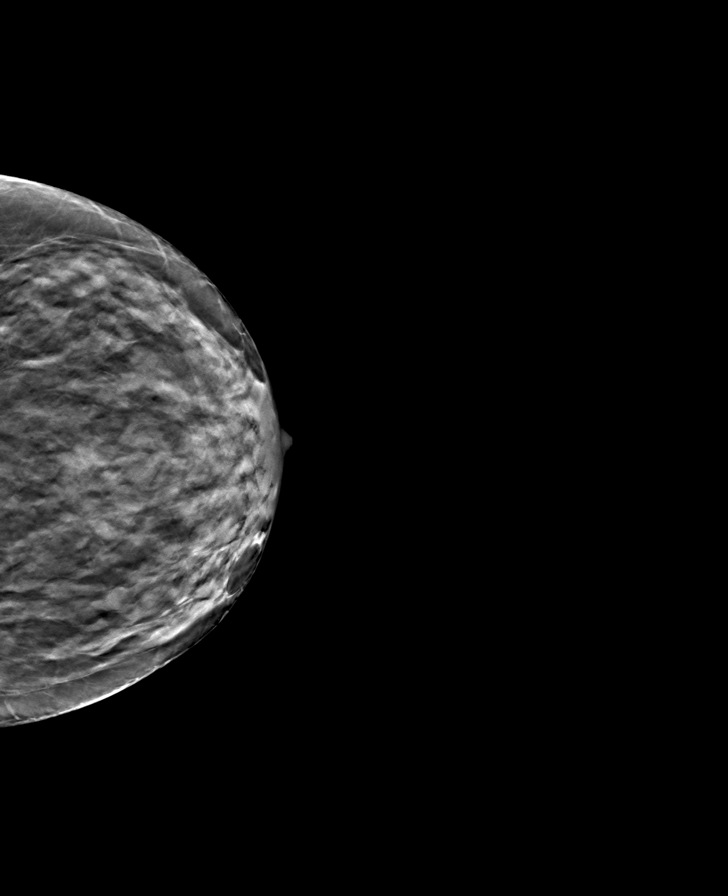

[L MLO tomo · tomo slice 26/51.0]
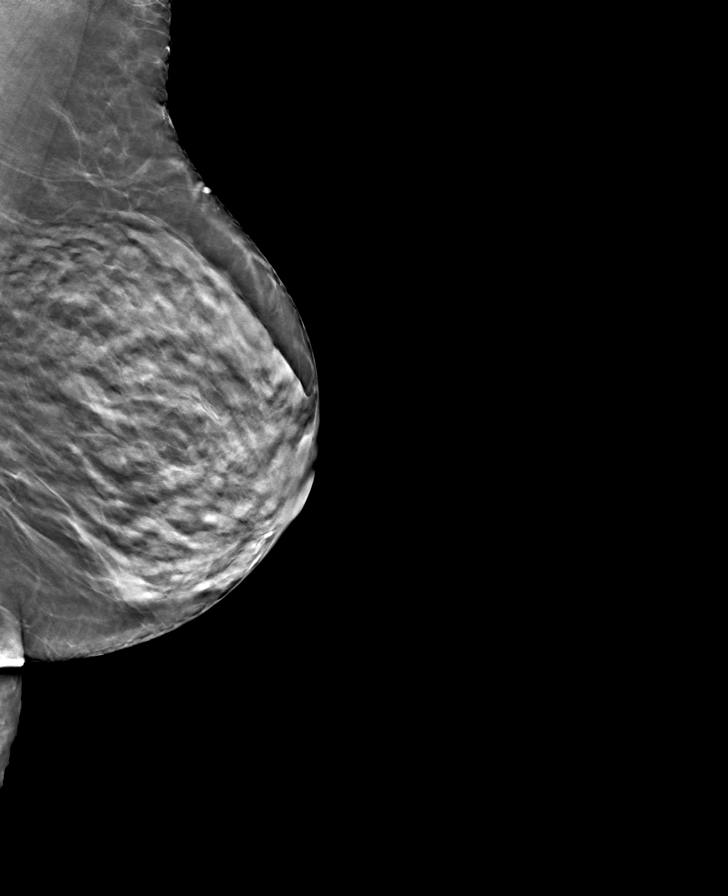

[R CC tomo · tomo slice 23/46.0]
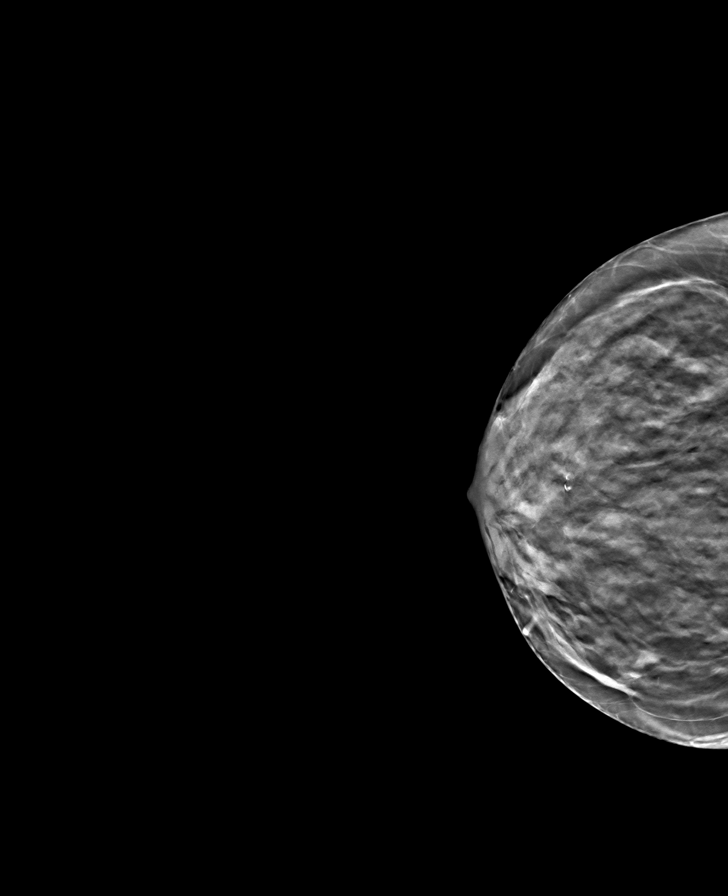

[R MLO tomo · tomo slice 25/49.0]
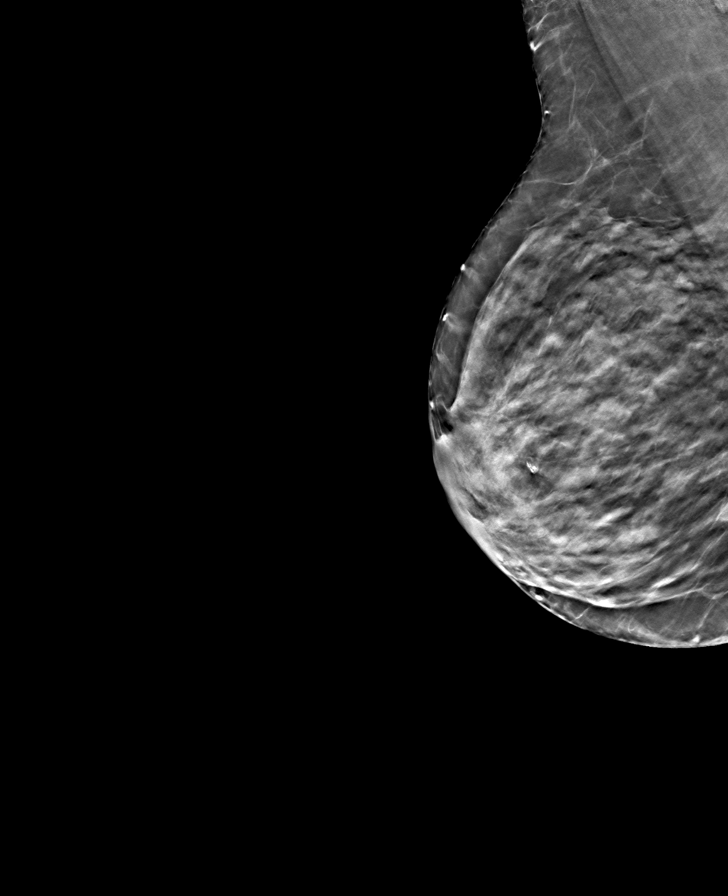

[8 of 24 positions shown; findings below may reference images not displayed]

ACR Breast Density Category c: The breast tissue is heterogeneously
dense, which may obscure small masses.
FINDINGS: There are no findings suspicious for malignancy.
IMPRESSION: No mammographic evidence of malignancy. A result letter of this
screening mammogram will be mailed directly to the patient.

RECOMMENDATION:
Screening mammogram in one year. (Code:[id number])

BI-RADS CATEGORY  1: Negative.

## 2022-09-10 ENCOUNTER — Other Ambulatory Visit: Payer: Self-pay | Admitting: Family Medicine

## 2022-09-10 DIAGNOSIS — Z1231 Encounter for screening mammogram for malignant neoplasm of breast: Secondary | ICD-10-CM

## 2022-09-27 ENCOUNTER — Other Ambulatory Visit: Payer: Self-pay | Admitting: Family Medicine

## 2022-10-01 ENCOUNTER — Ambulatory Visit
Admission: RE | Admit: 2022-10-01 | Discharge: 2022-10-01 | Disposition: A | Discharge status: Home / Self Care | Payer: Medicare Other | Source: Ambulatory Visit | Attending: Family Medicine | Admitting: Family Medicine

## 2022-10-01 DIAGNOSIS — Z1231 Encounter for screening mammogram for malignant neoplasm of breast: Secondary | ICD-10-CM | POA: Insufficient documentation

## 2022-10-09 ENCOUNTER — Other Ambulatory Visit: Payer: Self-pay | Admitting: Family Medicine

## 2022-10-09 DIAGNOSIS — E0865 Diabetes mellitus due to underlying condition with hyperglycemia: Secondary | ICD-10-CM

## 2022-10-10 NOTE — Telephone Encounter (Signed)
Requested Prescriptions  Pending Prescriptions Disp Refills   pioglitazone (ACTOS) 15 MG tablet [Pharmacy Med Name: Pioglitazone HCl 15 MG Oral Tablet] 100 tablet 0    Sig: TAKE 1 TABLET BY MOUTH DAILY     Endocrinology:  Diabetes - Glitazones - pioglitazone Passed - 10/09/2022 10:26 PM      Passed - HBA1C is between 0 and 7.9 and within 180 days    Hemoglobin A1C  Date Value Ref Range Status  05/08/2020 5.7  Final   Hgb A1c MFr Bld  Date Value Ref Range Status  05/27/2022 6.9 (H) 4.8 - 5.6 % Final    Comment:             Prediabetes: 5.7 - 6.4          Diabetes: >6.4          Glycemic control for adults with diabetes: <7.0          Passed - Valid encounter within last 6 months    Recent Outpatient Visits           4 months ago Essential hypertension   [place] [facility]   7 months ago Statin myopathy   [place] [facility]   10 months ago Essential hypertension   [place] [facility]   1 year ago Annual physical exam   [facility]   1 year ago Diabetes mellitus due to underlying condition, uncontrolled, with hyperglycemia [facility])   [place] [facility]       Future Appointments             In 1 month Duanne Limerick, MD [facility] & [facility] at [facility], [facility]

## 2022-10-18 ENCOUNTER — Other Ambulatory Visit: Payer: Self-pay | Admitting: Family Medicine

## 2022-10-18 DIAGNOSIS — I10 Essential (primary) hypertension: Secondary | ICD-10-CM

## 2022-10-24 ENCOUNTER — Other Ambulatory Visit: Payer: Self-pay | Admitting: Family Medicine

## 2022-10-24 DIAGNOSIS — I10 Essential (primary) hypertension: Secondary | ICD-10-CM

## 2022-10-24 NOTE — Telephone Encounter (Signed)
Requested Prescriptions  Pending Prescriptions Disp Refills   metoprolol succinate (TOPROL-XL) 50 MG 24 hr tablet [Pharmacy Med Name: Metoprolol Succinate ER 50 MG Oral Tablet Extended Release 24 Hour] 90 tablet 0    Sig: TAKE 1 TABLET BY MOUTH DAILY  WITH OR IMMEDIATELY FOLLOWING A  MEAL     Cardiovascular:  Beta Blockers Passed - 10/24/2022  4:34 AM      Passed - Last BP in normal range    BP Readings from Last 1 Encounters:  05/27/22 120/78         Passed - Last Heart Rate in normal range    Pulse Readings from Last 1 Encounters:  05/27/22 80         Passed - Valid encounter within last 6 months    Recent Outpatient Visits           5 months ago Essential hypertension   [place] [facility]   7 months ago Statin myopathy   [place] [facility]   10 months ago Essential hypertension   [place] [facility]   1 year ago Annual physical exam   [facility]   1 year ago Diabetes mellitus due to underlying condition, uncontrolled, with hyperglycemia [facility])   [place] [facility]       Future Appointments             In 1 month Duanne Limerick, MD [facility] at [facility], [facility]

## 2022-11-07 ENCOUNTER — Other Ambulatory Visit: Payer: Self-pay | Admitting: Family Medicine

## 2022-11-07 DIAGNOSIS — E785 Hyperlipidemia, unspecified: Secondary | ICD-10-CM

## 2022-11-07 DIAGNOSIS — G720 Drug-induced myopathy: Secondary | ICD-10-CM

## 2022-11-07 NOTE — Telephone Encounter (Signed)
Medication Refill - Medication: ezetimibe (ZETIA) 10 MG tablet [[id number]]   Has the patient contacted their pharmacy? Yes.   (Agent: If no, request that the patient contact the pharmacy for the refill. If patient does not wish to contact the pharmacy document the reason why and proceed with request.) (Agent: If yes, when and what did the pharmacy advise?)  Preferred Pharmacy (with phone number or street name): [facility] - [place], [place] - [address] Phone: [phone]  Fax: [phone]   Has the patient been seen for an appointment in the last year OR does the patient have an upcoming appointment? Yes.    Agent: Please be advised that RX refills may take up to 3 business days. We ask that you follow-up with your pharmacy.

## 2022-11-10 MED ORDER — EZETIMIBE 10 MG PO TABS
10.0000 mg | ORAL_TABLET | Freq: Every day | ORAL | 1 refills | Status: DC
Start: 2022-11-10 — End: 2022-11-27

## 2022-11-10 NOTE — Telephone Encounter (Signed)
Requested Prescriptions  Pending Prescriptions Disp Refills   ezetimibe (ZETIA) 10 MG tablet 100 tablet 1    Sig: Take 1 tablet (10 mg total) by mouth daily.     Cardiovascular:  Antilipid - Sterol Transport Inhibitors Failed - 11/07/2022  2:19 PM      Failed - Lipid Panel in normal range within the last 12 months    Cholesterol, Total  Date Value Ref Range Status  05/27/2022 152 100 - 199 mg/dL Final   LDL Chol Calc (NIH)  Date Value Ref Range Status  05/27/2022 89 0 - 99 mg/dL Final   HDL  Date Value Ref Range Status  05/27/2022 44 >39 mg/dL Final   Triglycerides  Date Value Ref Range Status  05/27/2022 105 0 - 149 mg/dL Final         Passed - AST in normal range and within 360 days    AST  Date Value Ref Range Status  12/05/2021 17 0 - 40 IU/L Final         Passed - ALT in normal range and within 360 days    ALT  Date Value Ref Range Status  12/05/2021 13 0 - 32 IU/L Final         Passed - Patient is not pregnant      Passed - Valid encounter within last 12 months    Recent Outpatient Visits           5 months ago Essential hypertension   [place] [facility]   8 months ago Statin myopathy   [place] [facility]   11 months ago Essential hypertension   [place] [facility]   1 year ago Annual physical exam   [facility]   1 year ago Diabetes mellitus due to underlying condition, uncontrolled, with hyperglycemia [facility])   [place] [facility]       Future Appointments             In 2 weeks Duanne Limerick, MD [facility] Primary Care & Sports Medicine at [facility], [facility]

## 2022-11-13 ENCOUNTER — Encounter: Payer: Self-pay | Admitting: Family Medicine

## 2022-11-13 ENCOUNTER — Ambulatory Visit (INDEPENDENT_AMBULATORY_CARE_PROVIDER_SITE_OTHER): Payer: Medicare Other | Admitting: Family Medicine

## 2022-11-13 VITALS — BP 124/68 | HR 88 | Temp 98.2°F | Ht 61.0 in | Wt 123.0 lb

## 2022-11-13 DIAGNOSIS — J0100 Acute maxillary sinusitis, unspecified: Secondary | ICD-10-CM

## 2022-11-13 MED ORDER — FLUCONAZOLE 150 MG PO TABS: 150.0000 mg | ORAL_TABLET | Freq: Once | ORAL | 0 refills | Status: AC

## 2022-11-13 MED ORDER — DOXYCYCLINE HYCLATE 100 MG PO TABS
100.0000 mg | ORAL_TABLET | Freq: Two times a day (BID) | ORAL | 0 refills | Status: DC
Start: 2022-11-13 — End: 2022-11-27

## 2022-11-13 NOTE — Progress Notes (Signed)
Date:  11/13/2022   Name:  Ann Chaney   DOB:  [birth year]   MRN:  [id number]   Chief Complaint: Cough (Cough and cong- clear/ thick production x 2 days- got wet last Tuesday a week ago)  Cough This is a new problem. The current episode started in the past 7 days. The problem has been waxing and waning. The problem occurs every few minutes. The cough is Non-productive. Associated symptoms include nasal congestion. Pertinent negatives include no chills, ear pain, fever, headaches, hemoptysis, myalgias, postnasal drip, rhinorrhea, sore throat, shortness of breath, sweats or wheezing. Nothing aggravates the symptoms. Treatments tried: corcedin. The treatment provided no relief.    Lab Results  Component Value Date   NA 143 05/27/2022   K 4.4 05/27/2022   CO2 22 05/27/2022   GLUCOSE 152 (H) 05/27/2022   BUN 12 05/27/2022   CREATININE 0.80 05/27/2022   CALCIUM 9.7 05/27/2022   EGFR 77 05/27/2022   GFRNONAA >60 04/12/2021   Lab Results  Component Value Date   CHOL 152 05/27/2022   HDL 44 05/27/2022   LDLCALC 89 05/27/2022   TRIG 105 05/27/2022   CHOLHDL 6.0 (H) 10/01/2017   Lab Results  Component Value Date   TSH 0.763 07/01/2017   Lab Results  Component Value Date   HGBA1C 6.9 (H) 05/27/2022   Lab Results  Component Value Date   WBC 9.0 04/12/2021   HGB 12.9 08/21/2021   HCT 38.6 04/12/2021   MCV 75.4 (L) 04/12/2021   PLT 322 04/12/2021   Lab Results  Component Value Date   ALT 13 12/05/2021   AST 17 12/05/2021   ALKPHOS 101 12/05/2021   BILITOT 0.4 12/05/2021   Lab Results  Component Value Date   VD25OH 31.1 06/24/2016     Review of Systems  Constitutional:  Negative for chills and fever.  HENT:  Negative for ear pain, postnasal drip, rhinorrhea and sore throat.   Respiratory:  Positive for cough. Negative for hemoptysis, shortness of breath and wheezing.   Musculoskeletal:  Negative for myalgias.  Neurological:  Negative for headaches.    Patient  Active Problem List   Diagnosis Date Noted   Gastroesophageal reflux disease 04/03/2021   Diabetes mellitus due to underlying condition, uncontrolled, with hyperglycemia (HCC) 04/03/2021   Constipation 02/13/2021   S/P resection of meningioma 01/23/2021   Meningioma (HCC) 01/14/2021   Drug-induced myopathy 09/23/2019   Multinodular goiter 04/16/2018   Mixed diabetic hyperlipidemia associated with type 2 diabetes mellitus (HCC) 11/10/2017   Tobacco dependence 11/10/2017   Microcytosis 11/17/2016   Depression, major, single episode, complete remission (HCC) 11/17/2016   Insomnia 07/22/2016   Hyperlipidemia 07/22/2016   Type 2 diabetes mellitus with hyperglycemia, without long-term current use of insulin (HCC) 06/24/2016   Dental abscess 08/27/2015   Smoker 11/04/2013   Goiter 01/26/2012   Essential hypertension 12/17/2009    Allergies  Allergen Reactions   Atorvastatin Other (See Comments)    Muscle pain   Pravastatin Sodium Nausea Only    Past Surgical History:  Procedure Laterality Date   ABDOMINAL HYSTERECTOMY     CRANIOTOMY Left 01/14/2021   Procedure: Craniotomy - left - Frontal - Tumor;  Surgeon: Julio Sicks, MD;  Location: [facility];  Service: Neurosurgery;  Laterality: Left;   HYSTEROTOMY      Social History   Tobacco Use   Smoking status: Former    Current packs/day: 0.00    Types: Cigarettes    Quit date: 07/31/2020  Years since quitting: 2.2   Smokeless tobacco: Never  Vaping Use   Vaping status: Never Used  Substance Use Topics   Alcohol use: No   Drug use: No     Medication list has been reviewed and updated.  Current Meds  Medication Sig   ACCU-CHEK AVIVA PLUS test strip 1 each daily.   Accu-Chek Softclix Lancets lancets    acetaminophen (TYLENOL) 500 MG tablet Take 500 mg by mouth every 6 (six) hours as needed (pain).   ezetimibe (ZETIA) 10 MG tablet Take 1 tablet (10 mg total) by mouth daily.   ferrous sulfate 325 (65 FE) MG tablet Take 1  tablet (325 mg total) by mouth daily with breakfast.   latanoprost (XALATAN) 0.005 % ophthalmic solution Place 1 drop into both eyes at bedtime.   lisinopril (ZESTRIL) 40 MG tablet TAKE 1 TABLET BY MOUTH DAILY   melatonin 3 MG TABS tablet Take 1 tablet (3 mg total) by mouth at bedtime.   metFORMIN (GLUCOPHAGE) 1000 MG tablet Take 1 tablet (1,000 mg total) by mouth 2 (two) times daily with a meal.   methimazole (TAPAZOLE) 5 MG tablet Take 0.5 tablets (2.5 mg total) by mouth in the morning.   metoprolol succinate (TOPROL-XL) 50 MG 24 hr tablet TAKE 1 TABLET BY MOUTH DAILY  WITH OR IMMEDIATELY FOLLOWING A  MEAL   Multiple Vitamin (MULTIVITAMIN WITH MINERALS) TABS tablet Take 1 tablet by mouth daily.   pioglitazone (ACTOS) 15 MG tablet TAKE 1 TABLET BY MOUTH DAILY   rosuvastatin (CRESTOR) 5 MG tablet TAKE 1 TABLET BY MOUTH ONCE A  WEEK   traZODone (DESYREL) 50 MG tablet Take 0.5 tablets (25 mg total) by mouth at bedtime.       11/13/2022    3:06 PM 05/27/2022    9:17 AM 03/11/2022   10:02 AM 12/05/2021   11:06 AM  GAD 7 : Generalized Anxiety Score  Nervous, Anxious, on Edge 0 0 0 0  Control/stop worrying 0 0 0 0  Worry too much - different things 0 0 0 0  Trouble relaxing 0 0 0 0  Restless 0 0 0 0  Easily annoyed or irritable 0 0 0 0  Afraid - awful might happen 0 0 0 0  Total GAD 7 Score 0 0 0 0  Anxiety Difficulty Not difficult at all Not difficult at all Not difficult at all Not difficult at all       11/13/2022    3:06 PM 08/20/2022   11:32 AM 05/27/2022    9:17 AM  Depression screen PHQ 2/9  Decreased Interest 0 0 0  Down, Depressed, Hopeless 0 0 0  PHQ - 2 Score 0 0 0  Altered sleeping 0 0 0  Tired, decreased energy 0 0 0  Change in appetite 0 0 0  Feeling bad or failure about yourself  0 0 0  Trouble concentrating 0 0 0  Moving slowly or fidgety/restless 0 0 0  Suicidal thoughts 0 0 0  PHQ-9 Score 0 0 0  Difficult doing work/chores Not difficult at all Not difficult at all  Not difficult at all    BP Readings from Last 3 Encounters:  11/13/22 124/68  05/27/22 120/78  03/11/22 120/78    Physical Exam Vitals and nursing note reviewed. Exam conducted with a chaperone present.  Constitutional:      General: She is not in acute distress.    Appearance: She is not diaphoretic.  HENT:  Head: Normocephalic and atraumatic.     Right Ear: Tympanic membrane and external ear normal.     Left Ear: Tympanic membrane and external ear normal.     Nose:     Right Sinus: Maxillary sinus tenderness present. No frontal sinus tenderness.     Left Sinus: Maxillary sinus tenderness present. No frontal sinus tenderness.     Mouth/Throat:     Pharynx: Oropharynx is clear. No oropharyngeal exudate or posterior oropharyngeal erythema.  Eyes:     General:        Right eye: No discharge.        Left eye: No discharge.     Conjunctiva/sclera: Conjunctivae normal.     Pupils: Pupils are equal, round, and reactive to light.  Neck:     Thyroid: No thyromegaly.     Vascular: No JVD.  Cardiovascular:     Rate and Rhythm: Normal rate and regular rhythm.     Heart sounds: Normal heart sounds. No murmur heard.    No friction rub. No gallop.  Pulmonary:     Effort: Pulmonary effort is normal.     Breath sounds: Normal breath sounds. No decreased air movement. No decreased breath sounds, wheezing, rhonchi or rales.  Abdominal:     General: Bowel sounds are normal.     Palpations: Abdomen is soft. There is no mass.     Tenderness: There is no abdominal tenderness. There is no guarding.  Musculoskeletal:        General: Normal range of motion.     Cervical back: Normal range of motion and neck supple.  Lymphadenopathy:     Cervical: No cervical adenopathy.  Skin:    General: Skin is warm and dry.  Neurological:     Mental Status: She is alert.     Deep Tendon Reflexes: Reflexes are normal and symmetric.     Wt Readings from Last 3 Encounters:  11/13/22 123 lb (55.8  kg)  08/20/22 137 lb (62.1 kg)  05/27/22 137 lb (62.1 kg)    BP 124/68   Pulse 88   Temp 98.2 F (36.8 C) (Oral)   Ht 5\' 1"  (1.549 m)   Wt 123 lb (55.8 kg)   SpO2 96%   BMI 23.24 kg/m   Assessment and Plan:  1. Acute maxillary sinusitis, recurrence not specified New onset.  Persistent.  Symptomatic right upper maxillary tenderness.  Patient had onset of symptoms after being in the weather and getting rain down.  Patient has upper respiratory congestion that is nonproductive with tenderness is noted over the maxillary sinus.  We will treat with doxycycline 100 mg twice a day and fluticasone for steroid nasal spray.  I have also recommended the patient pick up Mucinex DM for expectorant and cough suppression.  Patient has been instructed to return on a as needed basis. - doxycycline (VIBRA-TABS) 100 MG tablet; Take 1 tablet (100 mg total) by mouth 2 (two) times daily.  Dispense: 20 tablet; Refill: 0   Elizabeth Sauer, MD

## 2022-11-27 ENCOUNTER — Encounter: Payer: Self-pay | Admitting: Family Medicine

## 2022-11-27 ENCOUNTER — Ambulatory Visit (INDEPENDENT_AMBULATORY_CARE_PROVIDER_SITE_OTHER): Payer: Medicare Other | Admitting: Family Medicine

## 2022-11-27 DIAGNOSIS — I10 Essential (primary) hypertension: Secondary | ICD-10-CM | POA: Diagnosis not present

## 2022-11-27 DIAGNOSIS — T466X5A Adverse effect of antihyperlipidemic and antiarteriosclerotic drugs, initial encounter: Secondary | ICD-10-CM | POA: Diagnosis not present

## 2022-11-27 DIAGNOSIS — E785 Hyperlipidemia, unspecified: Secondary | ICD-10-CM

## 2022-11-27 DIAGNOSIS — G4700 Insomnia, unspecified: Secondary | ICD-10-CM

## 2022-11-27 DIAGNOSIS — G720 Drug-induced myopathy: Secondary | ICD-10-CM | POA: Diagnosis not present

## 2022-11-27 MED ORDER — TRAZODONE HCL 50 MG PO TABS: 25.0000 mg | ORAL_TABLET | Freq: Every day | ORAL | 1 refills | Status: DC

## 2022-11-27 MED ORDER — EZETIMIBE 10 MG PO TABS: 10.0000 mg | ORAL_TABLET | Freq: Every day | ORAL | 1 refills | Status: DC

## 2022-11-27 MED ORDER — METOPROLOL SUCCINATE ER 50 MG PO TB24: 50.0000 mg | ORAL_TABLET | Freq: Every day | ORAL | 1 refills | Status: DC

## 2022-11-27 MED ORDER — LISINOPRIL 40 MG PO TABS: 40.0000 mg | ORAL_TABLET | Freq: Every day | ORAL | 1 refills | Status: DC

## 2022-11-27 MED ORDER — ROSUVASTATIN CALCIUM 5 MG PO TABS: 5.0000 mg | ORAL_TABLET | ORAL | 1 refills | Status: DC

## 2022-11-27 NOTE — Progress Notes (Signed)
Date:  11/27/2022   Name:  Ann Chaney   DOB:  [birth year]   MRN:  [id number]   Chief Complaint: Hyperlipidemia, Hypertension, and Insomnia  Hyperlipidemia This is a chronic problem. The current episode started more than 1 year ago. The problem is controlled. She has no history of chronic renal disease. There are no known factors aggravating her hyperlipidemia. Pertinent negatives include no chest pain, focal sensory loss, focal weakness, leg pain, myalgias or shortness of breath. Current antihyperlipidemic treatment includes statins and ezetimibe. The current treatment provides moderate improvement of lipids. There are no compliance problems.  Risk factors for coronary artery disease include dyslipidemia and hypertension.  Hypertension This is a chronic problem. The current episode started more than 1 year ago. The problem is controlled. Pertinent negatives include no anxiety, blurred vision, chest pain, headaches, malaise/fatigue, neck pain, orthopnea, palpitations, peripheral edema, PND, shortness of breath or sweats. Past treatments include beta blockers and ACE inhibitors. The current treatment provides moderate improvement. There are no compliance problems.  There is no history of angina, kidney disease, CAD/MI, CVA, heart failure, left ventricular hypertrophy, PVD or retinopathy. There is no history of chronic renal disease, a hypertension causing med or renovascular disease.  Insomnia Primary symptoms: no fragmented sleep, no sleep disturbance, no difficulty falling asleep, no malaise/fatigue.   The problem has been gradually improving since onset. Past treatments include medication (trazadone). The treatment provided moderate relief. PMH includes: no hypertension.     Lab Results  Component Value Date   NA 140 06/17/2022   K 4.4 06/17/2022   CO2 22 05/27/2022   GLUCOSE 152 (H) 05/27/2022   BUN 13 06/17/2022   CREATININE 0.7 06/17/2022   CALCIUM 9.7 05/27/2022   EGFR 90  06/17/2022   GFRNONAA >60 04/12/2021   Lab Results  Component Value Date   CHOL 166 06/17/2022   HDL 48 06/17/2022   LDLCALC 99 06/17/2022   TRIG 92 06/17/2022   CHOLHDL 6.0 (H) 10/01/2017   Lab Results  Component Value Date   TSH 1.10 06/17/2022   Lab Results  Component Value Date   HGBA1C 6.9 06/17/2022   Lab Results  Component Value Date   WBC 9.0 04/12/2021   HGB 12.9 08/21/2021   HCT 38.6 04/12/2021   MCV 75.4 (L) 04/12/2021   PLT 322 04/12/2021   Lab Results  Component Value Date   ALT 13 12/05/2021   AST 17 12/05/2021   ALKPHOS 101 12/05/2021   BILITOT 0.4 12/05/2021   Lab Results  Component Value Date   VD25OH 31.1 06/24/2016     Review of Systems  Constitutional:  Negative for chills, diaphoresis, fatigue, fever and malaise/fatigue.  HENT:  Negative for drooling, ear pain, facial swelling and sore throat.   Eyes:  Negative for blurred vision and visual disturbance.  Respiratory:  Positive for cough. Negative for chest tightness, shortness of breath, wheezing and stridor.   Cardiovascular:  Negative for chest pain, palpitations, orthopnea and PND.  Gastrointestinal:  Negative for abdominal pain and blood in stool.  Endocrine: Negative for polydipsia and polyuria.  Genitourinary:  Negative for hematuria.  Musculoskeletal:  Negative for myalgias and neck pain.  Neurological:  Negative for focal weakness and headaches.  Psychiatric/Behavioral:  Negative for sleep disturbance. The patient has insomnia.     Patient Active Problem List   Diagnosis Date Noted   Gastroesophageal reflux disease 04/03/2021   Diabetes mellitus due to underlying condition, uncontrolled, with hyperglycemia (HCC) 04/03/2021  Constipation 02/13/2021   S/P resection of meningioma 01/23/2021   Meningioma (HCC) 01/14/2021   Drug-induced myopathy 09/23/2019   Multinodular goiter 04/16/2018   Mixed diabetic hyperlipidemia associated with type 2 diabetes mellitus (HCC) 11/10/2017    Tobacco dependence 11/10/2017   Microcytosis 11/17/2016   Depression, major, single episode, complete remission (HCC) 11/17/2016   Insomnia 07/22/2016   Hyperlipidemia 07/22/2016   Type 2 diabetes mellitus with hyperglycemia, without long-term current use of insulin (HCC) 06/24/2016   Dental abscess 08/27/2015   Smoker 11/04/2013   Goiter 01/26/2012   Essential hypertension 12/17/2009    Allergies  Allergen Reactions   Atorvastatin Other (See Comments)    Muscle pain   Pravastatin Sodium Nausea Only    Past Surgical History:  Procedure Laterality Date   ABDOMINAL HYSTERECTOMY     CRANIOTOMY Left 01/14/2021   Procedure: Craniotomy - left - Frontal - Tumor;  Surgeon: Julio Sicks, MD;  Location: [facility];  Service: Neurosurgery;  Laterality: Left;   HYSTEROTOMY      Social History   Tobacco Use   Smoking status: Former    Current packs/day: 0.00    Types: Cigarettes    Quit date: 07/31/2020    Years since quitting: 2.3   Smokeless tobacco: Never  Vaping Use   Vaping status: Never Used  Substance Use Topics   Alcohol use: No   Drug use: No     Medication list has been reviewed and updated.  Current Meds  Medication Sig   ACCU-CHEK AVIVA PLUS test strip 1 each daily.   Accu-Chek Softclix Lancets lancets    acetaminophen (TYLENOL) 500 MG tablet Take 500 mg by mouth every 6 (six) hours as needed (pain).   ezetimibe (ZETIA) 10 MG tablet Take 1 tablet (10 mg total) by mouth daily.   ferrous sulfate 325 (65 FE) MG tablet Take 1 tablet (325 mg total) by mouth daily with breakfast.   latanoprost (XALATAN) 0.005 % ophthalmic solution Place 1 drop into both eyes at bedtime.   lisinopril (ZESTRIL) 40 MG tablet TAKE 1 TABLET BY MOUTH DAILY   melatonin 3 MG TABS tablet Take 1 tablet (3 mg total) by mouth at bedtime.   metFORMIN (GLUCOPHAGE) 1000 MG tablet Take 1 tablet (1,000 mg total) by mouth 2 (two) times daily with a meal.   methimazole (TAPAZOLE) 5 MG tablet Take 0.5 tablets  (2.5 mg total) by mouth in the morning.   metoprolol succinate (TOPROL-XL) 50 MG 24 hr tablet TAKE 1 TABLET BY MOUTH DAILY  WITH OR IMMEDIATELY FOLLOWING A  MEAL   Multiple Vitamin (MULTIVITAMIN WITH MINERALS) TABS tablet Take 1 tablet by mouth daily.   pioglitazone (ACTOS) 15 MG tablet TAKE 1 TABLET BY MOUTH DAILY   rosuvastatin (CRESTOR) 5 MG tablet TAKE 1 TABLET BY MOUTH ONCE A  WEEK   traZODone (DESYREL) 50 MG tablet Take 0.5 tablets (25 mg total) by mouth at bedtime.       11/27/2022   10:25 AM 11/13/2022    3:06 PM 05/27/2022    9:17 AM 03/11/2022   10:02 AM  GAD 7 : Generalized Anxiety Score  Nervous, Anxious, on Edge 0 0 0 0  Control/stop worrying 0 0 0 0  Worry too much - different things 0 0 0 0  Trouble relaxing 0 0 0 0  Restless 0 0 0 0  Easily annoyed or irritable 0 0 0 0  Afraid - awful might happen 0 0 0 0  Total GAD 7 Score  0 0 0 0  Anxiety Difficulty Not difficult at all Not difficult at all Not difficult at all Not difficult at all       11/27/2022   10:25 AM 11/13/2022    3:06 PM 08/20/2022   11:32 AM  Depression screen PHQ 2/9  Decreased Interest 0 0 0  Down, Depressed, Hopeless 0 0 0  PHQ - 2 Score 0 0 0  Altered sleeping 0 0 0  Tired, decreased energy 0 0 0  Change in appetite 0 0 0  Feeling bad or failure about yourself  0 0 0  Trouble concentrating 0 0 0  Moving slowly or fidgety/restless 0 0 0  Suicidal thoughts 0 0 0  PHQ-9 Score 0 0 0  Difficult doing work/chores Not difficult at all Not difficult at all Not difficult at all    BP Readings from Last 3 Encounters:  11/27/22 128/70  11/13/22 124/68  05/27/22 120/78    Physical Exam Vitals and nursing note reviewed. Exam conducted with a chaperone present.  Constitutional:      General: She is not in acute distress.    Appearance: She is not diaphoretic.  HENT:     Head: Normocephalic and atraumatic.     Right Ear: External ear normal.     Left Ear: External ear normal.     Nose: Nose  normal.  Eyes:     General:        Right eye: No discharge.        Left eye: No discharge.     Conjunctiva/sclera: Conjunctivae normal.     Pupils: Pupils are equal, round, and reactive to light.  Neck:     Thyroid: No thyromegaly.     Vascular: No JVD.  Cardiovascular:     Rate and Rhythm: Normal rate and regular rhythm.     Heart sounds: Normal heart sounds. No murmur heard.    No friction rub. No gallop.  Pulmonary:     Effort: Pulmonary effort is normal.     Breath sounds: Normal breath sounds.  Abdominal:     General: Bowel sounds are normal.     Palpations: Abdomen is soft. There is no mass.     Tenderness: There is no abdominal tenderness. There is no guarding.  Musculoskeletal:        General: Normal range of motion.     Cervical back: Normal range of motion and neck supple.  Lymphadenopathy:     Cervical: No cervical adenopathy.  Skin:    General: Skin is warm and dry.  Neurological:     Mental Status: She is alert.     Deep Tendon Reflexes: Reflexes are normal and symmetric.     Wt Readings from Last 3 Encounters:  11/27/22 123 lb (55.8 kg)  11/13/22 123 lb (55.8 kg)  08/20/22 137 lb (62.1 kg)    BP 128/70   Pulse 80   Ht 5\' 1"  (1.549 m)   Wt 123 lb (55.8 kg)   SpO2 99%   BMI 23.24 kg/m   Assessment and Plan:  1. Statin myopathy Chronic.  Controlled.  Stable.  Patient is currently on Zetia 10 mg once a day.  However she is tolerating Crestor 5 mg once a week. - ezetimibe (ZETIA) 10 MG tablet; Take 1 tablet (10 mg total) by mouth daily.  Dispense: 100 tablet; Refill: 1  2. Hyperlipidemia, unspecified hyperlipidemia type .  Controlled.  Stable.  Continue Zetia 10 mg once a day and  rosuvastatin 5 mg once a week. - ezetimibe (ZETIA) 10 MG tablet; Take 1 tablet (10 mg total) by mouth daily.  Dispense: 100 tablet; Refill: 1 - rosuvastatin (CRESTOR) 5 MG tablet; Take 1 tablet (5 mg total) by mouth once a week.  Dispense: 15 tablet; Refill: 1  3. Essential  hypertension Chronic.  Controlled.  Stable.  Blood pressure 128/70.  Asymptomatic.  Tolerating medication well.  Continue lisinopril 40 mg once a day and metoprolol XL 50 mg daily.  Will recheck in 6 months. - lisinopril (ZESTRIL) 40 MG tablet; Take 1 tablet (40 mg total) by mouth daily.  Dispense: 90 tablet; Refill: 1 - metoprolol succinate (TOPROL-XL) 50 MG 24 hr tablet; Take 1 tablet (50 mg total) by mouth daily. Take with or immediately following a meal.  Dispense: 90 tablet; Refill: 1  4. Insomnia, unspecified type Chronic.  Controlled.  Stable.  Patient is able to achieve sleep without fragmentation on current dosing of trazodone 50 mg 1/2 tablet nightly as needed.  May repeat the other half if awakens. - traZODone (DESYREL) 50 MG tablet; Take 0.5 tablets (25 mg total) by mouth at bedtime.  Dispense: 45 tablet; Refill: 1    Elizabeth Sauer, MD

## 2022-11-28 ENCOUNTER — Other Ambulatory Visit: Payer: Self-pay | Admitting: Family Medicine

## 2022-11-28 NOTE — Telephone Encounter (Signed)
Medication Refill - Medication: Accu-Chek Softclix Lancets lancets   Has the patient contacted their pharmacy? Yes.   (Agent: If no, request that the patient contact the pharmacy for the refill. If patient does not wish to contact the pharmacy document the reason why and proceed with request.)   Preferred Pharmacy (with phone number or street name):  [facility] - [place], [place] - [address]  [address] [place] [address]  Phone: [phone] Fax: [phone]  Hours: Not open 24 hours   Has the patient been seen for an appointment in the last year OR does the patient have an upcoming appointment? Yes.    Agent: Please be advised that RX refills may take up to 3 business days. We ask that you follow-up with your pharmacy.

## 2022-12-01 NOTE — Telephone Encounter (Signed)
Requested medication (s) are due for refill today: yes  Requested medication (s) are on the active medication list: yes  Last refill:  08/01/19  Future visit scheduled: yes  Notes to clinic:  Unable to refill per protocol, last refill by historical provider.      Requested Prescriptions  Pending Prescriptions Disp Refills   Accu-Chek Softclix Lancets lancets 100 each      Endocrinology: Diabetes - Testing Supplies Passed - 11/28/2022  3:17 PM      Passed - Valid encounter within last 12 months    Recent Outpatient Visits           4 days ago Statin myopathy   [place] [facility]   2 weeks ago Acute maxillary sinusitis, recurrence not specified   [place] [facility]   6 months ago Essential hypertension   [place] [facility]   8 months ago Statin myopathy   [place] [facility]   12 months ago Essential hypertension   [place] [facility]       Future Appointments             In 5 months Duanne Limerick, MD [facility] at [facility]

## 2022-12-03 DIAGNOSIS — E042 Nontoxic multinodular goiter: Secondary | ICD-10-CM | POA: Diagnosis not present

## 2022-12-03 DIAGNOSIS — R499 Unspecified voice and resonance disorder: Secondary | ICD-10-CM | POA: Diagnosis not present

## 2022-12-03 DIAGNOSIS — E1165 Type 2 diabetes mellitus with hyperglycemia: Secondary | ICD-10-CM | POA: Diagnosis not present

## 2022-12-03 DIAGNOSIS — E0590 Thyrotoxicosis, unspecified without thyrotoxic crisis or storm: Secondary | ICD-10-CM | POA: Diagnosis not present

## 2022-12-04 DIAGNOSIS — E113293 Type 2 diabetes mellitus with mild nonproliferative diabetic retinopathy without macular edema, bilateral: Secondary | ICD-10-CM | POA: Diagnosis not present

## 2022-12-04 DIAGNOSIS — H2513 Age-related nuclear cataract, bilateral: Secondary | ICD-10-CM | POA: Diagnosis not present

## 2022-12-04 DIAGNOSIS — H401132 Primary open-angle glaucoma, bilateral, moderate stage: Secondary | ICD-10-CM | POA: Diagnosis not present

## 2022-12-17 ENCOUNTER — Other Ambulatory Visit: Payer: Self-pay | Admitting: Family Medicine

## 2022-12-17 DIAGNOSIS — I10 Essential (primary) hypertension: Secondary | ICD-10-CM

## 2022-12-18 NOTE — Telephone Encounter (Signed)
Requested Prescriptions  Pending Prescriptions Disp Refills   lisinopril (ZESTRIL) 40 MG tablet [Pharmacy Med Name: Lisinopril 40 MG Oral Tablet] 90 tablet 3    Sig: TAKE 1 TABLET BY MOUTH DAILY     Cardiovascular:  ACE Inhibitors Failed - 12/17/2022 10:01 PM      Failed - Cr in normal range and within 180 days    Creatinine  Date Value Ref Range Status  06/17/2022 0.7 0.5 - 1.1 Final   Creatinine, Ser  Date Value Ref Range Status  05/27/2022 0.80 0.57 - 1.00 mg/dL Final         Failed - K in normal range and within 180 days    Potassium  Date Value Ref Range Status  06/17/2022 4.4 3.5 - 5.1 mEq/L Final         Passed - Patient is not pregnant      Passed - Last BP in normal range    BP Readings from Last 1 Encounters:  11/27/22 128/70         Passed - Valid encounter within last 6 months    Recent Outpatient Visits           3 weeks ago Statin myopathy   [place] [facility]   1 month ago Acute maxillary sinusitis, recurrence not specified   [place] [facility]   6 months ago Essential hypertension   [place] [facility]   9 months ago Statin myopathy   [place] [facility]   1 year ago Essential hypertension   [place] [facility]       Future Appointments             In 5 months Duanne Limerick, MD [facility] at [facility], [facility]

## 2022-12-26 ENCOUNTER — Other Ambulatory Visit: Payer: Self-pay | Admitting: Family Medicine

## 2022-12-26 DIAGNOSIS — I10 Essential (primary) hypertension: Secondary | ICD-10-CM

## 2022-12-31 DIAGNOSIS — E042 Nontoxic multinodular goiter: Secondary | ICD-10-CM | POA: Diagnosis not present

## 2023-01-02 ENCOUNTER — Other Ambulatory Visit: Payer: Self-pay | Admitting: Family Medicine

## 2023-01-02 DIAGNOSIS — I10 Essential (primary) hypertension: Secondary | ICD-10-CM

## 2023-01-05 ENCOUNTER — Other Ambulatory Visit: Payer: Self-pay | Admitting: Family Medicine

## 2023-01-05 DIAGNOSIS — E785 Hyperlipidemia, unspecified: Secondary | ICD-10-CM

## 2023-01-05 NOTE — Telephone Encounter (Signed)
Medication Refill - Medication: rosuvastatin (CRESTOR) 5 MG tablet   Pt took last pill today   Has the patient contacted their pharmacy? Yes.   (Agent: If no, request that the patient contact the pharmacy for the refill. If patient does not wish to contact the pharmacy document the reason why and proceed with request.) (Agent: If yes, when and what did the pharmacy advise?)  Preferred Pharmacy (with phone number or street name):  [facility] - [place], [place] - [address]  [address] [place] [address]  Phone: [phone] Fax: [phone]   Has the patient been seen for an appointment in the last year OR does the patient have an upcoming appointment? Yes.    Agent: Please be advised that RX refills may take up to 3 business days. We ask that you follow-up with your pharmacy.

## 2023-01-06 ENCOUNTER — Ambulatory Visit (INDEPENDENT_AMBULATORY_CARE_PROVIDER_SITE_OTHER): Payer: Medicare Other

## 2023-01-06 DIAGNOSIS — Z23 Encounter for immunization: Secondary | ICD-10-CM

## 2023-01-06 MED ORDER — ROSUVASTATIN CALCIUM 5 MG PO TABS
5.0000 mg | ORAL_TABLET | ORAL | 0 refills | Status: DC
Start: 2023-01-06 — End: 2023-03-16

## 2023-01-06 NOTE — Telephone Encounter (Signed)
Requested Prescriptions  Pending Prescriptions Disp Refills   rosuvastatin (CRESTOR) 5 MG tablet 15 tablet 0    Sig: Take 1 tablet (5 mg total) by mouth once a week.     Cardiovascular:  Antilipid - Statins 2 Failed - 01/05/2023 12:14 PM      Failed - Lipid Panel in normal range within the last 12 months    Cholesterol, Total  Date Value Ref Range Status  05/27/2022 152 100 - 199 mg/dL Final   Cholesterol  Date Value Ref Range Status  06/17/2022 166 0 - 200 Final   LDL Chol Calc (NIH)  Date Value Ref Range Status  05/27/2022 89 0 - 99 mg/dL Final   LDL Cholesterol  Date Value Ref Range Status  06/17/2022 99  Final   HDL  Date Value Ref Range Status  06/17/2022 48 35 - 70 Final  05/27/2022 44 >39 mg/dL Final   Triglycerides  Date Value Ref Range Status  06/17/2022 92 40 - 160 Final         Passed - Cr in normal range and within 360 days    Creatinine  Date Value Ref Range Status  06/17/2022 0.7 0.5 - 1.1 Final   Creatinine, Ser  Date Value Ref Range Status  05/27/2022 0.80 0.57 - 1.00 mg/dL Final         Passed - Patient is not pregnant      Passed - Valid encounter within last 12 months    Recent Outpatient Visits           1 month ago Statin myopathy   [facility]   1 month ago Acute maxillary sinusitis, recurrence not specified   [place] [facility]   7 months ago Essential hypertension   [facility]   10 months ago Statin myopathy   [facility]   1 year ago Essential hypertension   [place] [facility]       Future Appointments             In 4 months Duanne Limerick, MD [facility] at  [facility], [facility]

## 2023-01-26 DIAGNOSIS — E042 Nontoxic multinodular goiter: Secondary | ICD-10-CM | POA: Diagnosis not present

## 2023-01-29 DIAGNOSIS — E042 Nontoxic multinodular goiter: Secondary | ICD-10-CM | POA: Diagnosis not present

## 2023-02-27 ENCOUNTER — Other Ambulatory Visit: Payer: Self-pay | Admitting: Family Medicine

## 2023-02-27 DIAGNOSIS — I10 Essential (primary) hypertension: Secondary | ICD-10-CM

## 2023-03-16 ENCOUNTER — Other Ambulatory Visit: Payer: Self-pay | Admitting: Family Medicine

## 2023-03-16 DIAGNOSIS — E785 Hyperlipidemia, unspecified: Secondary | ICD-10-CM

## 2023-03-30 DIAGNOSIS — H2513 Age-related nuclear cataract, bilateral: Secondary | ICD-10-CM | POA: Diagnosis not present

## 2023-03-30 DIAGNOSIS — H401132 Primary open-angle glaucoma, bilateral, moderate stage: Secondary | ICD-10-CM | POA: Diagnosis not present

## 2023-03-30 DIAGNOSIS — E113293 Type 2 diabetes mellitus with mild nonproliferative diabetic retinopathy without macular edema, bilateral: Secondary | ICD-10-CM | POA: Diagnosis not present

## 2023-03-31 ENCOUNTER — Inpatient Hospital Stay (HOSPITAL_COMMUNITY): Payer: Medicare Other

## 2023-03-31 ENCOUNTER — Encounter (HOSPITAL_COMMUNITY): Payer: Self-pay

## 2023-03-31 ENCOUNTER — Emergency Department: Payer: Medicare Other

## 2023-03-31 ENCOUNTER — Ambulatory Visit: Payer: Medicare Other

## 2023-03-31 ENCOUNTER — Emergency Department
Admission: EM | Admit: 2023-03-31 | Discharge: 2023-03-31 | Disposition: A | Discharge status: Home / Self Care | Payer: Medicare Other | Attending: Emergency Medicine | Admitting: Emergency Medicine

## 2023-03-31 ENCOUNTER — Other Ambulatory Visit: Payer: Self-pay

## 2023-03-31 ENCOUNTER — Inpatient Hospital Stay (HOSPITAL_COMMUNITY)
Admission: AD | Admit: 2023-03-31 | Discharge: 2023-04-25 | DRG: 166 | Disposition: E | Payer: Medicare Other | Source: Other Acute Inpatient Hospital | Attending: Critical Care Medicine | Admitting: Critical Care Medicine

## 2023-03-31 DIAGNOSIS — G935 Compression of brain: Secondary | ICD-10-CM

## 2023-03-31 DIAGNOSIS — C3412 Malignant neoplasm of upper lobe, left bronchus or lung: Secondary | ICD-10-CM | POA: Diagnosis not present

## 2023-03-31 DIAGNOSIS — R001 Bradycardia, unspecified: Secondary | ICD-10-CM | POA: Insufficient documentation

## 2023-03-31 DIAGNOSIS — G9349 Other encephalopathy: Secondary | ICD-10-CM | POA: Diagnosis not present

## 2023-03-31 DIAGNOSIS — H409 Unspecified glaucoma: Secondary | ICD-10-CM | POA: Diagnosis present

## 2023-03-31 DIAGNOSIS — Z7189 Other specified counseling: Secondary | ICD-10-CM

## 2023-03-31 DIAGNOSIS — G9340 Encephalopathy, unspecified: Secondary | ICD-10-CM | POA: Insufficient documentation

## 2023-03-31 DIAGNOSIS — R627 Adult failure to thrive: Secondary | ICD-10-CM | POA: Diagnosis present

## 2023-03-31 DIAGNOSIS — E440 Moderate protein-calorie malnutrition: Secondary | ICD-10-CM | POA: Diagnosis not present

## 2023-03-31 DIAGNOSIS — Z8673 Personal history of transient ischemic attack (TIA), and cerebral infarction without residual deficits: Secondary | ICD-10-CM

## 2023-03-31 DIAGNOSIS — Z79899 Other long term (current) drug therapy: Secondary | ICD-10-CM | POA: Diagnosis not present

## 2023-03-31 DIAGNOSIS — G40901 Epilepsy, unspecified, not intractable, with status epilepticus: Secondary | ICD-10-CM | POA: Diagnosis not present

## 2023-03-31 DIAGNOSIS — E049 Nontoxic goiter, unspecified: Secondary | ICD-10-CM | POA: Diagnosis not present

## 2023-03-31 DIAGNOSIS — Z681 Body mass index (BMI) 19 or less, adult: Secondary | ICD-10-CM

## 2023-03-31 DIAGNOSIS — Z83511 Family history of glaucoma: Secondary | ICD-10-CM

## 2023-03-31 DIAGNOSIS — E11649 Type 2 diabetes mellitus with hypoglycemia without coma: Secondary | ICD-10-CM | POA: Diagnosis not present

## 2023-03-31 DIAGNOSIS — C719 Malignant neoplasm of brain, unspecified: Secondary | ICD-10-CM | POA: Diagnosis not present

## 2023-03-31 DIAGNOSIS — G319 Degenerative disease of nervous system, unspecified: Secondary | ICD-10-CM | POA: Diagnosis not present

## 2023-03-31 DIAGNOSIS — I639 Cerebral infarction, unspecified: Secondary | ICD-10-CM | POA: Diagnosis not present

## 2023-03-31 DIAGNOSIS — E119 Type 2 diabetes mellitus without complications: Secondary | ICD-10-CM | POA: Insufficient documentation

## 2023-03-31 DIAGNOSIS — C7931 Secondary malignant neoplasm of brain: Secondary | ICD-10-CM | POA: Diagnosis present

## 2023-03-31 DIAGNOSIS — Z4682 Encounter for fitting and adjustment of non-vascular catheter: Secondary | ICD-10-CM | POA: Diagnosis not present

## 2023-03-31 DIAGNOSIS — C3492 Malignant neoplasm of unspecified part of left bronchus or lung: Secondary | ICD-10-CM | POA: Diagnosis not present

## 2023-03-31 DIAGNOSIS — Z825 Family history of asthma and other chronic lower respiratory diseases: Secondary | ICD-10-CM

## 2023-03-31 DIAGNOSIS — E0500 Thyrotoxicosis with diffuse goiter without thyrotoxic crisis or storm: Secondary | ICD-10-CM | POA: Diagnosis not present

## 2023-03-31 DIAGNOSIS — C3432 Malignant neoplasm of lower lobe, left bronchus or lung: Secondary | ICD-10-CM | POA: Diagnosis not present

## 2023-03-31 DIAGNOSIS — J9691 Respiratory failure, unspecified with hypoxia: Secondary | ICD-10-CM | POA: Diagnosis not present

## 2023-03-31 DIAGNOSIS — G40101 Localization-related (focal) (partial) symptomatic epilepsy and epileptic syndromes with simple partial seizures, not intractable, with status epilepticus: Principal | ICD-10-CM | POA: Diagnosis present

## 2023-03-31 DIAGNOSIS — R636 Underweight: Secondary | ICD-10-CM | POA: Diagnosis not present

## 2023-03-31 DIAGNOSIS — J9859 Other diseases of mediastinum, not elsewhere classified: Secondary | ICD-10-CM | POA: Diagnosis not present

## 2023-03-31 DIAGNOSIS — G40911 Epilepsy, unspecified, intractable, with status epilepticus: Secondary | ICD-10-CM | POA: Insufficient documentation

## 2023-03-31 DIAGNOSIS — I6782 Cerebral ischemia: Secondary | ICD-10-CM | POA: Insufficient documentation

## 2023-03-31 DIAGNOSIS — J9601 Acute respiratory failure with hypoxia: Secondary | ICD-10-CM

## 2023-03-31 DIAGNOSIS — F17210 Nicotine dependence, cigarettes, uncomplicated: Secondary | ICD-10-CM | POA: Diagnosis present

## 2023-03-31 DIAGNOSIS — G9389 Other specified disorders of brain: Secondary | ICD-10-CM | POA: Diagnosis not present

## 2023-03-31 DIAGNOSIS — C729 Malignant neoplasm of central nervous system, unspecified: Secondary | ICD-10-CM | POA: Diagnosis not present

## 2023-03-31 DIAGNOSIS — R64 Cachexia: Secondary | ICD-10-CM | POA: Diagnosis present

## 2023-03-31 DIAGNOSIS — Z781 Physical restraint status: Secondary | ICD-10-CM

## 2023-03-31 DIAGNOSIS — Z66 Do not resuscitate: Secondary | ICD-10-CM | POA: Diagnosis not present

## 2023-03-31 DIAGNOSIS — I10 Essential (primary) hypertension: Secondary | ICD-10-CM | POA: Insufficient documentation

## 2023-03-31 DIAGNOSIS — J440 Chronic obstructive pulmonary disease with acute lower respiratory infection: Secondary | ICD-10-CM | POA: Diagnosis present

## 2023-03-31 DIAGNOSIS — G936 Cerebral edema: Secondary | ICD-10-CM | POA: Diagnosis not present

## 2023-03-31 DIAGNOSIS — T3995XA Adverse effect of unspecified nonopioid analgesic, antipyretic and antirheumatic, initial encounter: Secondary | ICD-10-CM | POA: Diagnosis not present

## 2023-03-31 DIAGNOSIS — J189 Pneumonia, unspecified organism: Secondary | ICD-10-CM | POA: Diagnosis present

## 2023-03-31 DIAGNOSIS — T380X5A Adverse effect of glucocorticoids and synthetic analogues, initial encounter: Secondary | ICD-10-CM | POA: Diagnosis present

## 2023-03-31 DIAGNOSIS — N179 Acute kidney failure, unspecified: Secondary | ICD-10-CM | POA: Diagnosis not present

## 2023-03-31 DIAGNOSIS — C7951 Secondary malignant neoplasm of bone: Secondary | ICD-10-CM | POA: Diagnosis present

## 2023-03-31 DIAGNOSIS — Z7984 Long term (current) use of oral hypoglycemic drugs: Secondary | ICD-10-CM

## 2023-03-31 DIAGNOSIS — R531 Weakness: Secondary | ICD-10-CM | POA: Diagnosis present

## 2023-03-31 DIAGNOSIS — N281 Cyst of kidney, acquired: Secondary | ICD-10-CM | POA: Diagnosis not present

## 2023-03-31 DIAGNOSIS — C799 Secondary malignant neoplasm of unspecified site: Secondary | ICD-10-CM

## 2023-03-31 DIAGNOSIS — R918 Other nonspecific abnormal finding of lung field: Secondary | ICD-10-CM | POA: Diagnosis not present

## 2023-03-31 DIAGNOSIS — D638 Anemia in other chronic diseases classified elsewhere: Secondary | ICD-10-CM | POA: Diagnosis present

## 2023-03-31 DIAGNOSIS — E039 Hypothyroidism, unspecified: Secondary | ICD-10-CM | POA: Diagnosis present

## 2023-03-31 DIAGNOSIS — E785 Hyperlipidemia, unspecified: Secondary | ICD-10-CM | POA: Diagnosis present

## 2023-03-31 DIAGNOSIS — Z515 Encounter for palliative care: Secondary | ICD-10-CM

## 2023-03-31 DIAGNOSIS — Z90710 Acquired absence of both cervix and uterus: Secondary | ICD-10-CM

## 2023-03-31 DIAGNOSIS — E1165 Type 2 diabetes mellitus with hyperglycemia: Secondary | ICD-10-CM | POA: Diagnosis not present

## 2023-03-31 DIAGNOSIS — E876 Hypokalemia: Secondary | ICD-10-CM | POA: Diagnosis present

## 2023-03-31 DIAGNOSIS — Z888 Allergy status to other drugs, medicaments and biological substances status: Secondary | ICD-10-CM

## 2023-03-31 DIAGNOSIS — I959 Hypotension, unspecified: Secondary | ICD-10-CM | POA: Diagnosis not present

## 2023-03-31 DIAGNOSIS — Z87891 Personal history of nicotine dependence: Secondary | ICD-10-CM | POA: Diagnosis not present

## 2023-03-31 DIAGNOSIS — Z833 Family history of diabetes mellitus: Secondary | ICD-10-CM

## 2023-03-31 DIAGNOSIS — Z86011 Personal history of benign neoplasm of the brain: Secondary | ICD-10-CM

## 2023-03-31 LAB — PHENYTOIN LEVEL, TOTAL: Phenytoin Lvl: 22.3 ug/mL — ABNORMAL HIGH (ref 10.0–20.0)

## 2023-03-31 LAB — CBC WITH DIFFERENTIAL/PLATELET
Abs Immature Granulocytes: 0.06 10*3/uL (ref 0.00–0.07)
Basophils Absolute: 0 10*3/uL (ref 0.0–0.1)
Basophils Relative: 0 %
Eosinophils Absolute: 0 10*3/uL (ref 0.0–0.5)
Eosinophils Relative: 0 %
HCT: 39.5 % (ref 36.0–46.0)
Hemoglobin: 12.4 g/dL (ref 12.0–15.0)
Immature Granulocytes: 1 %
Lymphocytes Relative: 14 %
Lymphs Abs: 1.1 10*3/uL (ref 0.7–4.0)
MCH: 23.2 pg — ABNORMAL LOW (ref 26.0–34.0)
MCHC: 31.4 g/dL (ref 30.0–36.0)
MCV: 73.8 fL — ABNORMAL LOW (ref 80.0–100.0)
Monocytes Absolute: 0.2 10*3/uL (ref 0.1–1.0)
Monocytes Relative: 2 %
Neutro Abs: 6.2 10*3/uL (ref 1.7–7.7)
Neutrophils Relative %: 83 %
Platelets: 324 10*3/uL (ref 150–400)
RBC: 5.35 MIL/uL — ABNORMAL HIGH (ref 3.87–5.11)
RDW: 15.1 % (ref 11.5–15.5)
WBC: 7.5 10*3/uL (ref 4.0–10.5)
nRBC: 0 % (ref 0.0–0.2)

## 2023-03-31 LAB — CBC
HCT: 39.1 % (ref 36.0–46.0)
Hemoglobin: 12.6 g/dL (ref 12.0–15.0)
MCH: 22.9 pg — ABNORMAL LOW (ref 26.0–34.0)
MCHC: 32.2 g/dL (ref 30.0–36.0)
MCV: 71.1 fL — ABNORMAL LOW (ref 80.0–100.0)
Platelets: 426 10*3/uL — ABNORMAL HIGH (ref 150–400)
RBC: 5.5 MIL/uL — ABNORMAL HIGH (ref 3.87–5.11)
RDW: 14.7 % (ref 11.5–15.5)
WBC: 6.1 10*3/uL (ref 4.0–10.5)
nRBC: 0 % (ref 0.0–0.2)

## 2023-03-31 LAB — COMPREHENSIVE METABOLIC PANEL
ALT: 9 U/L (ref 0–44)
AST: 17 U/L (ref 15–41)
Albumin: 4.4 g/dL (ref 3.5–5.0)
Alkaline Phosphatase: 125 U/L (ref 38–126)
Anion gap: 15 (ref 5–15)
BUN: 12 mg/dL (ref 8–23)
CO2: 22 mmol/L (ref 22–32)
Calcium: 9.7 mg/dL (ref 8.9–10.3)
Chloride: 101 mmol/L (ref 98–111)
Creatinine, Ser: 0.66 mg/dL (ref 0.44–1.00)
GFR, Estimated: >60 mL/min (ref >60)
Glucose, Bld: 149 mg/dL — ABNORMAL HIGH (ref 70–99)
Potassium: 3.2 mmol/L — ABNORMAL LOW (ref 3.5–5.1)
Sodium: 138 mmol/L (ref 135–145)
Total Bilirubin: 0.8 mg/dL (ref 0.0–1.2)
Total Protein: 7.9 g/dL (ref 6.5–8.1)

## 2023-03-31 LAB — CBG MONITORING, ED: Glucose-Capillary: 138 mg/dL — ABNORMAL HIGH (ref 70–99)

## 2023-03-31 LAB — COMPREHENSIVE METABOLIC PANEL WITH GFR
ALT: 9 U/L (ref 0–44)
AST: 17 U/L (ref 15–41)
Albumin: 3.6 g/dL (ref 3.5–5.0)
Alkaline Phosphatase: 104 U/L (ref 38–126)
Anion gap: 16 — ABNORMAL HIGH (ref 5–15)
BUN: 10 mg/dL (ref 8–23)
CO2: 18 mmol/L — ABNORMAL LOW (ref 22–32)
Calcium: 9 mg/dL (ref 8.9–10.3)
Chloride: 105 mmol/L (ref 98–111)
Creatinine, Ser: 0.71 mg/dL (ref 0.44–1.00)
GFR, Estimated: >60 mL/min
Glucose, Bld: 212 mg/dL — ABNORMAL HIGH (ref 70–99)
Potassium: 3.9 mmol/L (ref 3.5–5.1)
Sodium: 139 mmol/L (ref 135–145)
Total Bilirubin: 0.9 mg/dL (ref 0.0–1.2)
Total Protein: 6.7 g/dL (ref 6.5–8.1)

## 2023-03-31 LAB — TSH: TSH: 0.545 u[IU]/mL (ref 0.350–4.500)

## 2023-03-31 LAB — DIFFERENTIAL
Abs Immature Granulocytes: 0.03 10*3/uL (ref 0.00–0.07)
Basophils Absolute: 0.1 10*3/uL (ref 0.0–0.1)
Basophils Relative: 1 %
Eosinophils Absolute: 0.4 10*3/uL (ref 0.0–0.5)
Eosinophils Relative: 6 %
Immature Granulocytes: 1 %
Lymphocytes Relative: 42 %
Lymphs Abs: 2.6 10*3/uL (ref 0.7–4.0)
Monocytes Absolute: 0.7 10*3/uL (ref 0.1–1.0)
Monocytes Relative: 11 %
Neutro Abs: 2.4 10*3/uL (ref 1.7–7.7)
Neutrophils Relative %: 39 %

## 2023-03-31 LAB — POCT I-STAT 7, (LYTES, BLD GAS, ICA,H+H)
Acid-base deficit: 6 mmol/L — ABNORMAL HIGH (ref 0.0–2.0)
Bicarbonate: 18.4 mmol/L — ABNORMAL LOW (ref 20.0–28.0)
Calcium, Ion: 1.21 mmol/L (ref 1.15–1.40)
HCT: 37 % (ref 36.0–46.0)
Hemoglobin: 12.6 g/dL (ref 12.0–15.0)
O2 Saturation: 99 %
Patient temperature: 97
Potassium: 3.6 mmol/L (ref 3.5–5.1)
Sodium: 138 mmol/L (ref 135–145)
TCO2: 19 mmol/L — ABNORMAL LOW (ref 22–32)
pCO2 arterial: 31 mm[Hg] — ABNORMAL LOW (ref 32–48)
pH, Arterial: 7.378 (ref 7.35–7.45)
pO2, Arterial: 134 mm[Hg] — ABNORMAL HIGH (ref 83–108)

## 2023-03-31 LAB — PROTIME-INR
INR: 1.2 (ref 0.8–1.2)
Prothrombin Time: 15.1 s (ref 11.4–15.2)

## 2023-03-31 LAB — ETHANOL: Alcohol, Ethyl (B): <10 mg/dL (ref <10)

## 2023-03-31 LAB — PHOSPHORUS: Phosphorus: 3.8 mg/dL (ref 2.5–4.6)

## 2023-03-31 LAB — T4, FREE: Free T4: 0.98 ng/dL (ref 0.61–1.12)

## 2023-03-31 LAB — GLUCOSE, CAPILLARY
Glucose-Capillary: 187 mg/dL — ABNORMAL HIGH (ref 70–99)
Glucose-Capillary: 209 mg/dL — ABNORMAL HIGH (ref 70–99)

## 2023-03-31 LAB — MRSA NEXT GEN BY PCR, NASAL: MRSA by PCR Next Gen: NOT DETECTED

## 2023-03-31 LAB — APTT: aPTT: 27 s (ref 24–36)

## 2023-03-31 LAB — MAGNESIUM: Magnesium: 1.8 mg/dL (ref 1.7–2.4)

## 2023-03-31 LAB — CK: Total CK: 51 U/L (ref 38–234)

## 2023-03-31 MED ORDER — PROPOFOL 1000 MG/100ML IV EMUL
0.0000 ug/kg/min | INTRAVENOUS | Status: DC
  Filled 2023-03-31: qty 100

## 2023-03-31 MED ORDER — LORAZEPAM 2 MG/ML IJ SOLN
2.0000 mg | Freq: Once | INTRAMUSCULAR | Status: DC
  Filled 2023-03-31: qty 1

## 2023-03-31 MED ORDER — LEVETIRACETAM 100 MG/ML PO SOLN
1000.0000 mg | Freq: Two times a day (BID) | ORAL | Status: DC
  Administered 2023-03-31: 1000 mg
  Filled 2023-03-31: qty 10

## 2023-03-31 MED ORDER — LORAZEPAM 2 MG/ML IJ SOLN: 2.0000 mg | INTRAMUSCULAR | Status: AC | PRN

## 2023-03-31 MED ORDER — SODIUM CHLORIDE 0.9 % IV SOLN
1.5000 g | Freq: Four times a day (QID) | INTRAVENOUS | Status: DC
  Administered 2023-03-31 – 2023-04-02 (×6): 1.5 g via INTRAVENOUS
  Filled 2023-03-31 (×9): qty 4

## 2023-03-31 MED ORDER — LORAZEPAM 2 MG/ML IJ SOLN
2.0000 mg | Freq: Once | INTRAMUSCULAR | Status: AC
  Administered 2023-03-31: 2 mg via INTRAVENOUS

## 2023-03-31 MED ORDER — ETOMIDATE 2 MG/ML IV SOLN
INTRAVENOUS | Status: AC
  Administered 2023-03-31: 10 mg via INTRAVENOUS
  Filled 2023-03-31: qty 10

## 2023-03-31 MED ORDER — POLYETHYLENE GLYCOL 3350 17 G PO PACK
17.0000 g | PACK | Freq: Every day | ORAL | Status: DC
  Administered 2023-03-31 – 2023-04-05 (×6): 17 g
  Filled 2023-03-31 (×6): qty 1

## 2023-03-31 MED ORDER — POTASSIUM CHLORIDE 10 MEQ/100ML IV SOLN
10.0000 meq | INTRAVENOUS | Status: AC
  Administered 2023-03-31 (×4): 10 meq via INTRAVENOUS
  Filled 2023-03-31 (×4): qty 100

## 2023-03-31 MED ORDER — INSULIN ASPART 100 UNIT/ML IJ SOLN
0.0000 [IU] | INTRAMUSCULAR | Status: DC
  Administered 2023-03-31: 5 [IU] via SUBCUTANEOUS
  Administered 2023-03-31: 3 [IU] via SUBCUTANEOUS
  Administered 2023-04-01 (×2): 2 [IU] via SUBCUTANEOUS
  Administered 2023-04-01: 3 [IU] via SUBCUTANEOUS
  Administered 2023-04-01: 2 [IU] via SUBCUTANEOUS
  Administered 2023-04-02: 3 [IU] via SUBCUTANEOUS
  Administered 2023-04-02: 5 [IU] via SUBCUTANEOUS
  Administered 2023-04-02: 3 [IU] via SUBCUTANEOUS
  Administered 2023-04-02: 2 [IU] via SUBCUTANEOUS
  Administered 2023-04-02: 3 [IU] via SUBCUTANEOUS
  Administered 2023-04-02: 2 [IU] via SUBCUTANEOUS
  Administered 2023-04-03: 3 [IU] via SUBCUTANEOUS
  Administered 2023-04-03: 2 [IU] via SUBCUTANEOUS
  Administered 2023-04-03: 5 [IU] via SUBCUTANEOUS
  Administered 2023-04-03: 2 [IU] via SUBCUTANEOUS
  Administered 2023-04-03: 8 [IU] via SUBCUTANEOUS
  Administered 2023-04-04: 5 [IU] via SUBCUTANEOUS

## 2023-03-31 MED ORDER — ROCURONIUM BROMIDE 10 MG/ML (PF) SYRINGE: 60.0000 mg | PREFILLED_SYRINGE | Freq: Once | INTRAVENOUS | Status: AC

## 2023-03-31 MED ORDER — MAGNESIUM SULFATE 2 GM/50ML IV SOLN
2.0000 g | Freq: Once | INTRAVENOUS | Status: AC
  Administered 2023-03-31: 2 g via INTRAVENOUS
  Filled 2023-03-31: qty 50

## 2023-03-31 MED ORDER — IPRATROPIUM-ALBUTEROL 0.5-2.5 (3) MG/3ML IN SOLN
3.0000 mL | Freq: Four times a day (QID) | RESPIRATORY_TRACT | Status: DC
  Administered 2023-03-31 – 2023-04-03 (×11): 3 mL via RESPIRATORY_TRACT
  Filled 2023-03-31 (×10): qty 3

## 2023-03-31 MED ORDER — DEXAMETHASONE 1 MG/ML PO CONC
4.0000 mg | Freq: Four times a day (QID) | ORAL | Status: DC
  Filled 2023-03-31 (×7): qty 4

## 2023-03-31 MED ORDER — SODIUM CHLORIDE 0.9 % IV SOLN: INTRAVENOUS | Status: DC

## 2023-03-31 MED ORDER — SODIUM CHLORIDE 0.9% FLUSH
3.0000 mL | Freq: Once | INTRAVENOUS | Status: AC
  Administered 2023-03-31: 3 mL via INTRAVENOUS

## 2023-03-31 MED ORDER — CHLORHEXIDINE GLUCONATE CLOTH 2 % EX PADS
6.0000 | MEDICATED_PAD | Freq: Every day | CUTANEOUS | Status: DC
  Administered 2023-03-31 – 2023-04-03 (×4): 6 via TOPICAL

## 2023-03-31 MED ORDER — FENTANYL CITRATE PF 50 MCG/ML IJ SOSY
25.0000 ug | PREFILLED_SYRINGE | INTRAMUSCULAR | Status: DC | PRN
  Administered 2023-04-03 – 2023-04-04 (×2): 25 ug via INTRAVENOUS
  Filled 2023-03-31 (×2): qty 1

## 2023-03-31 MED ORDER — DEXAMETHASONE SODIUM PHOSPHATE 4 MG/ML IJ SOLN
4.0000 mg | Freq: Four times a day (QID) | INTRAMUSCULAR | Status: DC
  Administered 2023-03-31 – 2023-04-01 (×5): 4 mg via INTRAVENOUS
  Filled 2023-03-31 (×5): qty 1

## 2023-03-31 MED ORDER — LEVETIRACETAM IN NACL 500 MG/100ML IV SOLN: 500.0000 mg | Freq: Two times a day (BID) | INTRAVENOUS | Status: DC

## 2023-03-31 MED ORDER — SODIUM CHLORIDE 0.9 % IV SOLN
1100.0000 mg | Freq: Once | INTRAVENOUS | Status: AC
  Administered 2023-03-31: 1100 mg via INTRAVENOUS
  Filled 2023-03-31: qty 20

## 2023-03-31 MED ORDER — FENTANYL CITRATE PF 50 MCG/ML IJ SOSY: 25.0000 ug | PREFILLED_SYRINGE | INTRAMUSCULAR | Status: DC | PRN

## 2023-03-31 MED ORDER — CLEVIDIPINE BUTYRATE 0.5 MG/ML IV EMUL
0.0000 mg/h | INTRAVENOUS | Status: DC
  Administered 2023-03-31: 10.5 mg/h via INTRAVENOUS
  Administered 2023-03-31: 2 mg/h via INTRAVENOUS
  Filled 2023-03-31 (×2): qty 50

## 2023-03-31 MED ORDER — LEVETIRACETAM IN NACL 1000 MG/100ML IV SOLN: 1000.0000 mg | Freq: Two times a day (BID) | INTRAVENOUS | Status: DC

## 2023-03-31 MED ORDER — NOREPINEPHRINE 4 MG/250ML-% IV SOLN
0.0000 ug/min | INTRAVENOUS | Status: DC
  Administered 2023-03-31: 2 ug/min via INTRAVENOUS
  Filled 2023-03-31: qty 250

## 2023-03-31 MED ORDER — DOCUSATE SODIUM 50 MG/5ML PO LIQD
100.0000 mg | Freq: Two times a day (BID) | ORAL | Status: DC
  Administered 2023-03-31 – 2023-04-05 (×10): 100 mg
  Filled 2023-03-31 (×10): qty 10

## 2023-03-31 MED ORDER — FAMOTIDINE 20 MG PO TABS
20.0000 mg | ORAL_TABLET | Freq: Two times a day (BID) | ORAL | Status: DC
  Administered 2023-03-31 – 2023-04-02 (×4): 20 mg
  Filled 2023-03-31 (×4): qty 1

## 2023-03-31 MED ORDER — LEVETIRACETAM IN NACL 1000 MG/100ML IV SOLN
1000.0000 mg | Freq: Once | INTRAVENOUS | Status: AC
  Administered 2023-03-31: 1000 mg via INTRAVENOUS

## 2023-03-31 MED ORDER — SODIUM CHLORIDE 0.9 % IV BOLUS
1000.0000 mL | Freq: Once | INTRAVENOUS | Status: AC
  Administered 2023-03-31: 1000 mL via INTRAVENOUS

## 2023-03-31 MED ORDER — ETOMIDATE 2 MG/ML IV SOLN
10.0000 mg | Freq: Once | INTRAVENOUS | Status: AC
Start: 2023-03-31 — End: 2023-03-31

## 2023-03-31 MED ORDER — MIDAZOLAM HCL 2 MG/2ML IJ SOLN: 1.0000 mg | INTRAMUSCULAR | Status: DC | PRN

## 2023-03-31 MED ORDER — PHENYTOIN SODIUM 50 MG/ML IJ SOLN
100.0000 mg | Freq: Three times a day (TID) | INTRAMUSCULAR | Status: DC
  Filled 2023-03-31: qty 2

## 2023-03-31 MED ORDER — FENTANYL CITRATE PF 50 MCG/ML IJ SOSY
25.0000 ug | PREFILLED_SYRINGE | INTRAMUSCULAR | Status: DC | PRN
  Administered 2023-04-01: 50 ug via INTRAVENOUS
  Administered 2023-04-02: 25 ug via INTRAVENOUS
  Administered 2023-04-02: 100 ug via INTRAVENOUS
  Administered 2023-04-03 – 2023-04-04 (×2): 50 ug via INTRAVENOUS
  Administered 2023-04-04: 100 ug via INTRAVENOUS
  Administered 2023-04-04: 50 ug via INTRAVENOUS
  Filled 2023-03-31 (×2): qty 1
  Filled 2023-03-31 (×2): qty 2
  Filled 2023-03-31: qty 1
  Filled 2023-03-31: qty 2
  Filled 2023-03-31 (×3): qty 1

## 2023-03-31 MED ORDER — LEVETIRACETAM IN NACL 1000 MG/100ML IV SOLN
INTRAVENOUS | Status: AC
  Administered 2023-03-31: 1000 mg via INTRAVENOUS
  Filled 2023-03-31: qty 200

## 2023-03-31 MED ORDER — DEXAMETHASONE SODIUM PHOSPHATE 10 MG/ML IJ SOLN
10.0000 mg | Freq: Once | INTRAMUSCULAR | Status: AC
  Administered 2023-03-31: 10 mg via INTRAVENOUS
  Filled 2023-03-31: qty 1

## 2023-03-31 MED ORDER — FENTANYL CITRATE PF 50 MCG/ML IJ SOSY
25.0000 ug | PREFILLED_SYRINGE | INTRAMUSCULAR | Status: DC | PRN
  Administered 2023-03-31: 50 ug via INTRAVENOUS
  Filled 2023-03-31: qty 1

## 2023-03-31 MED ORDER — DEXMEDETOMIDINE HCL IN NACL 400 MCG/100ML IV SOLN
0.0000 ug/kg/h | INTRAVENOUS | Status: DC
  Administered 2023-04-01: 0.4 ug/kg/h via INTRAVENOUS
  Filled 2023-03-31: qty 100

## 2023-03-31 MED ORDER — ROCURONIUM BROMIDE 10 MG/ML (PF) SYRINGE
PREFILLED_SYRINGE | INTRAVENOUS | Status: AC
  Administered 2023-03-31: 60 mg via INTRAVENOUS
  Filled 2023-03-31: qty 10

## 2023-03-31 MED ORDER — SODIUM CHLORIDE 0.9 % IV SOLN: 2000.0000 mg | Freq: Once | INTRAVENOUS | Status: DC

## 2023-03-31 MED ORDER — LORAZEPAM 2 MG/ML IJ SOLN
INTRAMUSCULAR | Status: AC
  Filled 2023-03-31: qty 1

## 2023-03-31 MED ORDER — GADOBUTROL 1 MMOL/ML IV SOLN
5.5000 mL | Freq: Once | INTRAVENOUS | Status: AC | PRN
  Administered 2023-03-31: 5.5 mL via INTRAVENOUS

## 2023-03-31 MED ORDER — LEVETIRACETAM 500 MG PO TABS: 1000.0000 mg | ORAL_TABLET | Freq: Two times a day (BID) | ORAL | Status: DC

## 2023-03-31 MED ORDER — PROPOFOL 1000 MG/100ML IV EMUL: 0.0000 ug/kg/min | INTRAVENOUS | Status: DC

## 2023-03-31 MED ORDER — POLYETHYLENE GLYCOL 3350 17 G PO PACK: 17.0000 g | PACK | Freq: Every day | ORAL | Status: DC | PRN

## 2023-03-31 MED ORDER — LEVETIRACETAM IN NACL 1000 MG/100ML IV SOLN
1000.0000 mg | Freq: Once | INTRAVENOUS | Status: AC
  Administered 2023-03-31: 1000 mg via INTRAVENOUS
  Filled 2023-03-31: qty 100

## 2023-03-31 MED ORDER — HEPARIN SODIUM (PORCINE) 5000 UNIT/ML IJ SOLN
5000.0000 [IU] | Freq: Three times a day (TID) | INTRAMUSCULAR | Status: DC
  Administered 2023-03-31 – 2023-04-01 (×2): 5000 [IU] via SUBCUTANEOUS
  Filled 2023-03-31 (×2): qty 1

## 2023-03-31 MED ORDER — PROPOFOL 1000 MG/100ML IV EMUL
INTRAVENOUS | Status: AC
  Administered 2023-03-31: 5 ug/kg/min via INTRAVENOUS
  Filled 2023-03-31: qty 100

## 2023-03-31 MED ORDER — LORAZEPAM 2 MG/ML IJ SOLN
2.0000 mg | Freq: Once | INTRAMUSCULAR | Status: AC
  Administered 2023-03-31: 2 mg via INTRAVENOUS
  Filled 2023-03-31: qty 1

## 2023-03-31 MED ORDER — IPRATROPIUM-ALBUTEROL 0.5-2.5 (3) MG/3ML IN SOLN
3.0000 mL | Freq: Four times a day (QID) | RESPIRATORY_TRACT | Status: DC | PRN
  Administered 2023-04-02 (×2): 3 mL via RESPIRATORY_TRACT
  Filled 2023-03-31: qty 3

## 2023-03-31 MED ORDER — DOCUSATE SODIUM 50 MG/5ML PO LIQD: 100.0000 mg | Freq: Two times a day (BID) | ORAL | Status: DC | PRN

## 2023-03-31 NOTE — Consult Note (Signed)
 NEUROLOGY CONSULT NOTE   Date of service: March 31, 2023 Patient Name: Ann Chaney MRN:  [id number] DOB:  December 12, 1945 Chief Complaint: Speech difficulty and shaking Requesting Provider: Harold Scholz, MD  History of Present Illness  Ann Chaney is a 78 y.o. female  has a past medical history of Left meningioma resection Oct 2022, Diabetes mellitus without complication (HCC), Glaucoma, Glaucoma, Hyperlipidemia, and Hypertension, ongoing 1/2 ppd smoking, who presented to [facility] in complex partial status, transferred to [facility] for continuous EEG.  Family at bedside confirming history collected by Dr. Merrianne earlier today.  She was last seen normal at 8 AM and subsequently found to be not speaking properly at 9 AM.  Approximately 30 minutes later began to phone at the mouth with shaking and left facial droop with left upper and lower extremity weakness, seen as a code stroke by Dr. Merrianne.  She had further witnessed seizure activity on the left side and the CT scanner with maintained awareness and ability to follow some simple right sided motor commands.  Head CT revealed a new 2.5 cm right superior temporal lobe lesion concerning for mass with regional edema  When not actively having seizure activity the patient was noted to have left upper greater than lower extremity weakness (left upper extremity not antigravity, left lower extremity 4/5, right upper extremity at least 4/5, right lower extremity 5/5, with reduced responsiveness on the left side and a left facial droop but no other clear cranial nerve abnormalities  Family notes she did fall and scrape her left knee getting out of bed sometime in the last week but otherwise no recent complaints or issues. No prior seizure history, no prior shaking of the left side or speech difficulty   Medications received for mass/seizure management Keppra  2 g at 1222, 1 g at 1456 Ativan  2 mg x 3 doses at 1222, 1246, 1301 Dexamethasone  10 mg at  1331 Fosphenytoin  20 mg/kg loading dose (assuming weight of 55 kg) at 1429  Intubated and transported to [facility] for further management, no further seizures per family and verbal report to nurse since fosphenytoin  load  At baseline: Ambulates with a cane sometimes, mostly independently, still cooks and no ongoing memory concerns although she did have an episode of leaving a hot dog in a microwave too long recently.  Manages all her ADLs.  Lives with one of her family members   ROS  Unable to assess secondary to patient's mental status   Past History   Past Medical History:  Diagnosis Date   Diabetes mellitus without complication (HCC)    Glaucoma    Glaucoma    Hyperlipidemia    Hypertension     Past Surgical History:  Procedure Laterality Date   ABDOMINAL HYSTERECTOMY     CRANIOTOMY Left 01/14/2021   Procedure: Craniotomy - left - Frontal - Tumor;  Surgeon: Louis Shove, MD;  Location: [facility] OR;  Service: Neurosurgery;  Laterality: Left;   HYSTEROTOMY      Family History: Family History  Problem Relation Age of Onset   Cancer Mother        sternum   Emphysema Mother    Diabetes Mother    Glaucoma Father    Dementia Father    Breast cancer Maternal Aunt     Social History  reports that she quit smoking about 2 years ago. Her smoking use included cigarettes. She has never used smokeless tobacco. She reports that she does not drink alcohol  and does  not use drugs.  Allergies  Allergen Reactions   Atorvastatin  Other (See Comments)    Muscle pain   Pravastatin  Sodium Nausea Only    Medications   Current Facility-Administered Medications:    Chlorhexidine  Gluconate Cloth 2 % PADS 6 each, 6 each, Topical, Daily, Harold Scholz, MD, 6 each at 03/31/23 1916  Vitals   Vitals:   03/31/23 1913 03/31/23 1931  SpO2:  100%  Weight: 34.6 kg   Height: 5' 4 (1.626 m)     Body mass index is 13.09 kg/m.  Physical Exam   Constitutional: Appears well-developed  and well-nourished.  Psych: Minimally interactive Eyes: Scleral edema is absent HENT: ET tube in place MSK: no major joint deformities.  Cardiovascular: Normal rate and regular rhythm.  Respiratory: Breathing comfortably on the ventilator, breathing over the ventilator set rate GI: Soft.  No distension.  Skin: Slight abrasion of the left knee.  There is no significant edema  Neurologic Examination   Neuro: Mental Status: Does not open eyes spontaneously, to voice or noxious stimulation Does not follow any commands Cranial Nerves: II: Pupils are equal, round, and reactive to light.   III,IV, VI/VIII: Gaze midline VOR suppressed V/VII: Facial sensation is symmetric to eyelash brush VIII: No response to voice X/XI: Intact cough/gag XII: Unable to assess tongue protrusion secondary to patient's mental status  Motor/Sensory: Slight movement to noxious stim in all four extremities Deep Tendon Reflexes: 3+ symmetric patellar, brachioradialis Plantars: Toes are mute bilaterally.  Cerebellar: Unable to assess secondary to patient's mental status    Labs/Imaging/Neurodiagnostic studies    Basic Metabolic Panel: Recent Labs  Lab 03/31/23 1227  NA 138  K 3.2*  CL 101  CO2 22  GLUCOSE 149*  BUN 12  CREATININE 0.66  CALCIUM  9.7  MG 1.8    CBC: Recent Labs  Lab 03/31/23 1227  WBC 6.1  NEUTROABS 2.4  HGB 12.6  HCT 39.1  MCV 71.1*  PLT 426*    Coagulation Studies: Recent Labs    03/31/23 1227  LABPROT 15.1  INR 1.2     CK 51  Lipid Panel:  Lab Results  Component Value Date   LDLCALC 99 06/17/2022   HgbA1c:  Lab Results  Component Value Date   HGBA1C 6.9 06/17/2022   Urine Drug Screen: No results found for: LABOPIA, COCAINSCRNUR, LABBENZ, AMPHETMU, THCU, LABBARB   Alcohol  Level     Component Value Date/Time   ETH <10 03/31/2023 1227   INR  Lab Results  Component Value Date   INR 1.2 03/31/2023   APTT  Lab Results  Component  Value Date   APTT 27 03/31/2023   AED levels: No results found for: PHENYTOIN , ZONISAMIDE, LAMOTRIGINE, LEVETIRACETA   CT Head without contrast(Personally reviewed): 1. Newly seen 2.5 cm abnormality in the right superior temporal lobe with associated regional vasogenic edema. This looks like a mass lesion. MRI without and with contrast recommended. 2. 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 Chronic small-vessel ischemic changes otherwise within the brain appear essentially stable. 3. Slight further ex vacuo enlargement of the frontal horns of the lateral ventricles relate to progressive volume loss in the frontal lobes. Obstructive hydrocephalus not suspected. 4. Aspects is 10, allowing for the chronic findings.    MRI Brain w and w/o contrast: Pending   Neurodiagnostics rEEG: 14:24 on 1/7 at [facility] This study is suggestive of cortical dysfunction in right hemisphere likely secondary to underlying structural  abnormality, postictal state.  Additionally there is severe diffuse encephalopathy, likely related to sedation.  No seizures or epileptiform discharges were seen throughout the recording.  cEEG  pending  ASSESSMENT   Ann Chaney is a 78 y.o. female  has a past medical history of Left meningioma resection Oct 2022, Diabetes mellitus without complication (HCC), Glaucoma, Glaucoma, Hyperlipidemia, and Hypertension, ongoing 1/2 ppd smoking, who presented to [facility] in complex partial status, found to have a new right temporal lobe lesion, transferred to [facility] for continuous EEG and further care.   Exam on arrival not concerning for ongoing seizures. Will start with MRI brain, then EEG hookup; plan as below  RECOMMENDATIONS  - MRI without and with contrast STAT - cEEG after MRI brain - Continue Keppra  at 1000 mg (max dose for current renal function) - Fosphenytoin  STAT loading dose of 20 mg PE/kg  has been given at 1429 at [facility].  - May add Dilantin  100 mg IV TID pending post-load level and clinical course; alternatively could consider vimpat if no heart block on EKG  - s/p 10 mg dexamethasone  at 13:31, 4 mg q6hrs for now, taper to be determined based on clinical course, A1c, and MRI findings - consider neurosurgical consultation after MRI brain results  - Frequent neuro checks - Inpatient seizure precautions - Additional 2 mg IV Ativan  if seizure activity recurs and call Neurology - Consider transition from propofol  to precedex  once on cEEG, reduced by 10 mg / hr if no further clinical concern for seizure - Discussed with CCM team at bedside (and via secure chat) and nursing and family  - Appreciate management of vent and comorbidities per CCM ______________________________________________________________________    Signed, Lola LITTIE Jernigan, MD [facility]  CRITICAL CARE Performed by: Lola LITTIE Jernigan   Total critical care time: 40 minutes  Critical care time was exclusive of separately billable procedures and treating other patients.  Critical care was necessary to treat or prevent imminent or life-threatening deterioration.  Critical care was time spent personally by me on the following activities: development of treatment plan with patient and/or surrogate as well as nursing, discussions with consultants, evaluation of patient's response to treatment, examination of patient, obtaining history from patient or surrogate, ordering and performing treatments and interventions, ordering and review of laboratory studies, ordering and review of radiographic studies, pulse oximetry and re-evaluation of patient's condition.

## 2023-03-31 NOTE — Consult Note (Addendum)
 NEUROLOGY CONSULT NOTE   Date of service: March 31, 2023 Patient Name: Ann Chaney MRN:  [id number] DOB:  [birth year] Chief Complaint: Left sided weakness Requesting Provider: No att. providers found  History of Present Illness  Ann Chaney is a 78 y.o. female who has a past medical history of Diabetes mellitus without complication (HCC), Glaucoma, Glaucoma, Hyperlipidemia, and Hypertension.as well as prior left-sided craniotomy for tumor resection in October of 2022, who presents to the ED after she was noted by her daughter to have become incoherent at 0900 today. LKN 0800. About 30 minutes after AMS was noted, the patient also began foaming at the mouth, with shaking activity, then developed left facial droop in conjunction with LUE and LLE weakness. Family was able to load her into the car and brought her to the ED. Staff subsequently called to the entrance of the ED to pull patient from the car. At that time, she did not have any shaking activity but noted to be weak on the left side. Code Stroke was called. CBG was 138. She was brought emergently to CT and continued to be altered with left sided weakness at that time, but no longer seemed to be jerking right after being brought to CT.   CT head was then completed. While still in the CT scanner, the patient started to exhibit spasmodic, repetitive and rhythmic left facial twitching together with foaming at the mouth and speech arrest, while maintaining an eyes-open awake state during which she could respond to some simple right sided motor commands. The seizure activity lasted for about 2 minutes before spontaneously resolving. Ativan  2 mg and Keppra  2000 mg IV were ordered while in CT.   CT head reveals a new 2.5 cm abnormality in the right superior temporal lobe with associated regional vasogenic edema; this looks like a mass lesion. 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 Chronic small-vessel ischemic changes otherwise within the brain appear essentially stable. Slight further ex vacuo enlargement of the frontal horns of the lateral ventricles relative to the prior study dated 04/12/2021, most likely related to progressive volume loss in the frontal lobes; obstructive hydrocephalus not suspected. Aspects is 10, allowing for the chronic findings.     ROS  Unable to ascertain due to AMS.   Past History   Past Medical History:  Diagnosis Date   Diabetes mellitus without complication (HCC)    Glaucoma    Glaucoma    Hyperlipidemia    Hypertension     Past Surgical History:  Procedure Laterality Date   ABDOMINAL HYSTERECTOMY     CRANIOTOMY Left 01/14/2021   Procedure: Craniotomy - left - Frontal - Tumor;  Surgeon: Louis Shove, MD;  Location: [facility] OR;  Service: Neurosurgery;  Laterality: Left;   HYSTEROTOMY      Family History: Family History  Problem Relation Age of Onset   Cancer Mother        sternum   Emphysema Mother    Diabetes Mother    Glaucoma Father    Dementia Father    Breast cancer Maternal Aunt     Social History  reports that she quit smoking about 2 years ago. Her smoking use included cigarettes. She has never used smokeless tobacco. She reports that she does not drink alcohol  and does not use drugs.  Allergies  Allergen Reactions   Atorvastatin  Other (See Comments)    Muscle pain   Pravastatin  Sodium Nausea  Only    Medications   Current Facility-Administered Medications:    LORazepam  (ATIVAN ) injection 2 mg, 2 mg, Intravenous, Once, Analysa Nutting, MD   sodium chloride  flush (NS) 0.9 % injection 3 mL, 3 mL, Intravenous, Once, Willo Dunnings, MD  Current Outpatient Medications:    ACCU-CHEK AVIVA PLUS test strip, 1 each daily., Disp: , Rfl:    Accu-Chek Softclix Lancets lancets, , Disp: , Rfl:    acetaminophen  (TYLENOL ) 500 MG tablet, Take 500 mg by mouth every 6 (six) hours as needed (pain)., Disp: , Rfl:     ezetimibe  (ZETIA ) 10 MG tablet, Take 1 tablet (10 mg total) by mouth daily., Disp: 100 tablet, Rfl: 1   ferrous sulfate  325 (65 FE) MG tablet, Take 1 tablet (325 mg total) by mouth daily with breakfast., Disp: 30 tablet, Rfl: 5   latanoprost  (XALATAN ) 0.005 % ophthalmic solution, Place 1 drop into both eyes at bedtime., Disp: , Rfl:    lisinopril  (ZESTRIL ) 40 MG tablet, TAKE 1 TABLET BY MOUTH DAILY, Disp: 90 tablet, Rfl: 0   melatonin 3 MG TABS tablet, Take 1 tablet (3 mg total) by mouth at bedtime., Disp: 30 tablet, Rfl: 0   metFORMIN  (GLUCOPHAGE ) 1000 MG tablet, Take 1 tablet (1,000 mg total) by mouth 2 (two) times daily with a meal., Disp: 180 tablet, Rfl: 1   methimazole  (TAPAZOLE ) 5 MG tablet, Take 0.5 tablets (2.5 mg total) by mouth in the morning., Disp: 30 tablet, Rfl: 0   metoprolol  succinate (TOPROL -XL) 50 MG 24 hr tablet, TAKE 1 TABLET BY MOUTH DAILY  WITH OR IMMEDIATELY FOLLOWING A  MEAL, Disp: 90 tablet, Rfl: 3   Multiple Vitamin (MULTIVITAMIN WITH MINERALS) TABS tablet, Take 1 tablet by mouth daily., Disp: , Rfl:    pioglitazone  (ACTOS ) 15 MG tablet, TAKE 1 TABLET BY MOUTH DAILY, Disp: 100 tablet, Rfl: 0   rosuvastatin  (CRESTOR ) 5 MG tablet, TAKE 1 TABLET BY MOUTH ONCE A  WEEK, Disp: 30 tablet, Rfl: 0   traZODone  (DESYREL ) 50 MG tablet, Take 0.5 tablets (25 mg total) by mouth at bedtime., Disp: 45 tablet, Rfl: 1  Vitals  There were no vitals filed for this visit.  There is no height or weight on file to calculate BMI.  Physical Exam   Physical Exam HEENT- [place]/AT. No nuchal rigidity.  Lungs- Respirations unlabored Extremities- Warm and well-perfused  Neurological Examination Mental Status: On Neurology arrival to CT, the patient is noted to be exhibiting spasmodic, repetitive and rhythmic left facial twitching in CT together with foaming at the mouth and speech arrest, while maintaining an eyes-open awake state during which she can respond to some simple right sided motor  commands. The seizure activity lasts for about 2 minutes before spontaneously resolving. Postictal confusion, dysarthria and aphasia is then noted. Cranial Nerves: (remainder of neurological exam performed after seizure activity stopped) II: Blinks to threat in bilateral temporal visual fields. PERRL. III,IV, VI: No ptosis. Will gaze to left and right. No nystagmus. V: Reacts to touch bilaterally VII: Left facial droop VIII: Hearing intact to voice IX,X: Hypophonic speech with some gurgling sounds emanating from her pharynx/larynx XI: Head is midline XII: Does not follow command for tongue extension Motor: RUE: Moves antigravity, grips and pushes/pulls against resistance, 4-5/5.  LUE: Decreased tone. Unable to hold antigravity. Slight spontaneous low amplitude movements. Does not give resistance to examiner.  RLE: 5/5, elevates antigravity without drift LLE: 4/5, elevates antigravity with drift Sensory: Reacts to touch and pinch on the right with  normal latencies. Decreased responsiveness to LUE tactile stimulation. Reacts normally to LLE tactile stimulation. Deep Tendon Reflexes: 2+ and symmetric bilateral brachioradialis and patellae Plantars: Right: downgoingLeft: downgoing Cerebellar: Unable to follow commands for assessment Gait: Deferred   Labs/Imaging/Neurodiagnostic studies   CBC: No results for input(s): WBC, NEUTROABS, HGB, HCT, MCV, PLT in the last 168 hours. Basic Metabolic Panel:  Lab Results  Component Value Date   NA 140 06/17/2022   K 4.4 06/17/2022   CO2 22 05/27/2022   GLUCOSE 152 (H) 05/27/2022   BUN 13 06/17/2022   CREATININE 0.7 06/17/2022   CALCIUM  9.7 05/27/2022   GFRNONAA >60 04/12/2021   GFRAA 89 04/26/2019   Lipid Panel:  Lab Results  Component Value Date   LDLCALC 99 06/17/2022   HgbA1c:  Lab Results  Component Value Date   HGBA1C 6.9 06/17/2022       ASSESSMENT  78 y.o. female who has a past medical history of DM, glaucoma,  HLD, HTN and prior left-sided craniotomy for tumor resection in October of 2022, who presents to the ED after she was noted by her daughter to have become incoherent at 0900 today. LKN 0800. About 30 minutes after AMS was noted, the patient also began foaming at the mouth, with shaking activity, then developed left facial droop in conjunction with LUE and LLE weakness. Family was able to load her into the car and brought her to the ED. Staff subsequently called to the entrance of the ED to pull patient from the car. At that time, she did not have any shaking activity but noted to be weak on the left side. Code Stroke was called. CBG was 138. She was brought emergently to CT and continued to be altered with left sided weakness at that time, but no longer seemed to be jerking right after being brought to CT. Seizure activity then recurred after CT head was completed, which was resolved spontaneously.  - Semiology of seizure witnessed in CT: Spasmodic, repetitive and rhythmic left facial twitching together with foaming at the mouth and speech arrest, while maintaining an eyes-open awake state during which she can respond to some simple right sided motor commands. The seizure activity lasted for about 2 minutes before spontaneously resolving.  - Exam after her seizure in CT reveals left facial droop, left upper and lower extremity weakness and aphasia.  - CT head: New 2.5 cm abnormality in the right superior temporal lobe with associated regional vasogenic edema; this looks like a mass lesion. 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 Chronic small-vessel ischemic changes otherwise within the brain appear essentially stable. Slight further ex vacuo enlargement of the frontal horns of the lateral ventricles relative to the prior study dated 04/12/2021, most likely related to progressive volume loss in the frontal lobes; obstructive hydrocephalus not  suspected. Aspects is 10, allowing for the chronic findings. - Has been loaded with Keppra  2000 mg IV + Ativan  2 mg IV. - After being brought back to the ED and completing Keppra  load. Seizure activity is noted to have recurred, but is now less prominent than before the above AED load. Now classifiable as being in partial complex status epilepticus, most likely secondary to epileptogenic focus near or adjacent to the cavitary right temporal lobe lesion seen on CT.   -Discussed with EDP at 12:43 PM. Loading an additional 1000 mg IV Keppra  and another dose of IV Ativan  2 mg will be administered.  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   RECOMMENDATIONS  - MRI without and with contrast recommended if eGFR is > 30 - STAT EEG (ordered) - Continue Keppra  at 1000 mg IV BID - Frequent neuro checks - Inpatient seizure precautions - Additional 2 mg IV Ativan  if seizure activity recurs and call Neurology  Addendum:  - Seizure activity has recurred despite additional Keppra  load with Ativan .  - Fosphenytoin  STAT loading dose of 20 mg PE/kg has been ordered.  - Continue with Dilantin  100 mg IV TID - Transferring to [facility] for LTM EEG - Discussed with Dr. Willo - STAT EEG to be performed here prior to transfer if there is time - Neurology at [facility] to reassess after arrival. I have notified Dr. Voncile.  Addendum: EEG: Continuous slow, generalized and lateralized right hemisphere. This study is suggestive of cortical dysfunction in right hemisphere likely secondary to underlying structural abnormality, postictal state.  Additionally there is severe diffuse encephalopathy, likely related to sedation.  No seizures or epileptiform discharges were seen throughout the recording.  70 minutes spent in the emergent neurological evaluation and management of this critically ill  patient ______________________________________________________________________   Bonney SHARK, Maxime Beckner, MD [facility]

## 2023-03-31 NOTE — Progress Notes (Signed)
 RT transported patient to MRI with RN x 2 without event.  Patient suctioned per VAP protocol.  RT will continue to monitor.

## 2023-03-31 NOTE — H&P (Signed)
 NAME:  Ann Chaney, MRN:  [id number], DOB:  [birth year], LOS: 0 ADMISSION DATE:  03/31/2023, CONSULTATION DATE:  1/7 REFERRING MD:  Dr. Willo EDP, CHIEF COMPLAINT:  Status epilepticus    History of Present Illness:  79 year old female with past medical history as below, which is significant for hypertension, diabetes, glaucoma, hyperthyroidism with goiter, meningioma causing seizure status post resection in 2022. She was in her usual state of health living with her daughter but quite independent on 1/7 when she developed alered mental status and facial droop with left sided weakness. She also had shaking activity and foaming at the mouth. Family brought her to [facility] ED by personal vehicle. Upon arrival to the ED a code stroke was initiated.  She was sent urgently for CT of the head and immediately following the scan the patient began to experience spasmodic, repetitive, and rhythmic left facial twitching and foaming at the mouth.  This resolved spontaneously after about 2 minutes.  She was given Ativan  and Keppra  load.  CT showed a new 2.5 cm abnormality in the right superior temporal lobe with surrounding vasogenic edema.  She was intubated ultimately for airway protection.  Spot EEG was negative for active seizure.  She was transferred to [facility] for ICU admission and continuous EEG monitoring.  Pertinent  Medical History   has a past medical history of Diabetes mellitus without complication (HCC), Glaucoma, Glaucoma, Hyperlipidemia, and Hypertension.   Significant Hospital Events: Including procedures, antibiotic start and stop dates in addition to other pertinent events     Interim History / Subjective:    Objective   Temperature (!) 97 F (36.1 C), temperature source Axillary, height 5' 4 (1.626 m), SpO2 100%.    Vent Mode: PRVC FiO2 (%):  [40 %] 40 % Set Rate:  [16 bmp] 16 bmp Vt Set:  [440 mL-450 mL] 440 mL PEEP:  [5 cmH20] 5 cmH20 Plateau Pressure:  [18 cmH20] 18 cmH20    Intake/Output Summary (Last 24 hours) at 03/31/2023 2029 Last data filed at 03/31/2023 2000 Gross per 24 hour  Intake --  Output 325 ml  Net -325 ml   Filed Weights    Examination: General: Elderly appearing female on mechanical ventilator HENT: Normocephalic, PERRL, very small laceration to the tip of the tongue with hemostasis Lungs: Clear bilateral breath sounds Cardiovascular: Regular rate and rhythm, no murmurs rubs or gallops Abdomen: Soft, nontender, nondistended Extremities: No acute deformity or range of motion mentation Neuro: Sedated, positive cough/gag, not following commands, examined on propofol  GU: Foley draining clear dilute urine  Resolved Hospital Problem list     Assessment & Plan:   Focal status epilepticus: Secondary to new right sided lesion -Appreciate neurology -AEDs per neurology -Keppra  (fosphenytoin  loaded in ED) -MRI and continuous EEG pending -Frequent neurochecks  Likely mass in the right temporal lobe: History of meningioma resection in 2022 on the left -Steroids for edema -Will consult neurosurgery once she is more stable -MRI pending  Acute respiratory failure with hypoxia Left upper lobe pneumonia -Full vent support -ABG pending -VAP bundle -SAT/SBT in the morning -Unasyn  -Sputum cultures  Diabetes mellitus -CBG monitoring and sliding scale insulin  every 4 -Hold metformin   Hypertension -Keep MAP greater than 65 and systolic blood pressure less than 160 -Holding home lisinopril , metoprolol  with borderline blood pressures from sedation  Hyperthyroidism with goiter -Pending surgical evaluation -Home medication list notes methimazole , family tells me this has been stopped.  Will hold -Check TSH, T4  Hypokalemia -From ED labs  around noon -Repeat chemistry now   Best Practice (right click and Reselect all SmartList Selections daily)   Diet/type: NPO DVT prophylaxis prophylactic heparin   Pressure ulcer(s): N/A GI  prophylaxis: H2B Lines: N/A Foley:  Yes, and it is still needed Code Status:  full code Last date of multidisciplinary goals of care discussion [ Disucssed GOC with both daughters in the ICU room. One of whom has POA. Full code for now. Does have an advanced directive at home, but not sure what it says. ]  Labs   CBC: Recent Labs  Lab 03/31/23 1227  WBC 6.1  NEUTROABS 2.4  HGB 12.6  HCT 39.1  MCV 71.1*  PLT 426*    Basic Metabolic Panel: Recent Labs  Lab 03/31/23 1227  NA 138  K 3.2*  CL 101  CO2 22  GLUCOSE 149*  BUN 12  CREATININE 0.66  CALCIUM  9.7  MG 1.8   GFR: Estimated Creatinine Clearance: 51.7 mL/min (by C-G formula based on SCr of 0.66 mg/dL). Recent Labs  Lab 03/31/23 1227  WBC 6.1    Liver Function Tests: Recent Labs  Lab 03/31/23 1227  AST 17  ALT 9  ALKPHOS 125  BILITOT 0.8  PROT 7.9  ALBUMIN 4.4   No results for input(s): LIPASE, AMYLASE in the last 168 hours. No results for input(s): AMMONIA in the last 168 hours.  ABG No results found for: PHART, PCO2ART, PO2ART, HCO3, TCO2, ACIDBASEDEF, O2SAT   Coagulation Profile: Recent Labs  Lab 03/31/23 1227  INR 1.2    Cardiac Enzymes: Recent Labs  Lab 03/31/23 1227  CKTOTAL 51    HbA1C: Hemoglobin A1C  Date/Time Value Ref Range Status  06/17/2022 12:00 AM 6.9  Final  05/08/2020 12:00 AM 5.7  Final   Hgb A1c MFr Bld  Date/Time Value Ref Range Status  05/27/2022 09:53 AM 6.9 (H) 4.8 - 5.6 % Final    Comment:             Prediabetes: 5.7 - 6.4          Diabetes: >6.4          Glycemic control for adults with diabetes: <7.0   12/05/2021 11:44 AM 6.7 (H) 4.8 - 5.6 % Final    Comment:             Prediabetes: 5.7 - 6.4          Diabetes: >6.4          Glycemic control for adults with diabetes: <7.0     CBG: Recent Labs  Lab 03/31/23 1204  GLUCAP 138*    Review of Systems:   Patient is encephalopathic and/or intubated; therefore, history has been  obtained from chart review.   Past Medical History:  She,  has a past medical history of Diabetes mellitus without complication (HCC), Glaucoma, Glaucoma, Hyperlipidemia, and Hypertension.   Surgical History:   Past Surgical History:  Procedure Laterality Date   ABDOMINAL HYSTERECTOMY     CRANIOTOMY Left 01/14/2021   Procedure: Craniotomy - left - Frontal - Tumor;  Surgeon: Louis Shove, MD;  Location: [facility] OR;  Service: Neurosurgery;  Laterality: Left;   HYSTEROTOMY       Social History:   reports that she quit smoking about 2 years ago. Her smoking use included cigarettes. She has never used smokeless tobacco. She reports that she does not drink alcohol  and does not use drugs.   Family History:  Her family history includes Breast cancer in her maternal  aunt; Cancer in her mother; Dementia in her father; Diabetes in her mother; Emphysema in her mother; Glaucoma in her father.   Allergies Allergies  Allergen Reactions   Lipitor [Atorvastatin ] Other (See Comments)    Myalgias   Pravachol  [Pravastatin  Sodium] Nausea Only     Home Medications  Prior to Admission medications   Medication Sig Start Date End Date Taking? Authorizing Provider  ACCU-CHEK AVIVA PLUS test strip 1 each daily. 06/24/19   [provider]  Accu-Chek Softclix Lancets lancets  06/24/19   [provider]  acetaminophen  (TYLENOL ) 500 MG tablet Take 500 mg by mouth every 6 (six) hours as needed (pain).    [provider]  dorzolamide-timolol (COSOPT) 2-0.5 % ophthalmic solution Place 1 drop into both eyes 2 (two) times daily. 02/18/23   [provider]  ezetimibe  (ZETIA ) 10 MG tablet Take 1 tablet (10 mg total) by mouth daily. 11/27/22   Joshua Cathryne BROCKS, MD  ferrous sulfate  325 (65 FE) MG tablet Take 1 tablet (325 mg total) by mouth daily with breakfast. 05/27/22   Joshua Cathryne BROCKS, MD  latanoprost  (XALATAN ) 0.005 % ophthalmic solution Place 1 drop into both eyes at bedtime.    [provider]  lisinopril  (ZESTRIL ) 40 MG tablet TAKE 1 TABLET BY MOUTH DAILY 02/28/23   Jones, Deanna C, MD  melatonin 3 MG TABS tablet Take 1 tablet (3 mg total) by mouth at bedtime. 02/07/21   Love, Sharlet RAMAN, PA-C  metFORMIN  (GLUCOPHAGE ) 1000 MG tablet Take 1 tablet (1,000 mg total) by mouth 2 (two) times daily with a meal. 05/27/22   Joshua Cathryne BROCKS, MD  methimazole  (TAPAZOLE ) 5 MG tablet Take 0.5 tablets (2.5 mg total) by mouth in the morning. 12/09/21   Joshua Cathryne BROCKS, MD  metoprolol  succinate (TOPROL -XL) 50 MG 24 hr tablet TAKE 1 TABLET BY MOUTH DAILY  WITH OR IMMEDIATELY FOLLOWING A  MEAL 12/26/22   Jones, Deanna C, MD  Multiple Vitamin (MULTIVITAMIN WITH MINERALS) TABS tablet Take 1 tablet by mouth daily.    [provider]  pioglitazone  (ACTOS ) 15 MG tablet TAKE 1 TABLET BY MOUTH DAILY 10/10/22   Joshua Cathryne BROCKS, MD  rosuvastatin  (CRESTOR ) 5 MG tablet TAKE 1 TABLET BY MOUTH ONCE A  WEEK 03/16/23   Joshua Cathryne BROCKS, MD  traZODone  (DESYREL ) 50 MG tablet Take 0.5 tablets (25 mg total) by mouth at bedtime. 11/27/22   Joshua Cathryne BROCKS, MD     Critical care time: 49 min     Deward Eastern, AGACNP-BC [facility]  See Amion for personal pager PCCM on call pager [phone] until 7pm. Please call Elink 7p-7a. [phone]  03/31/2023 9:31 PM

## 2023-03-31 NOTE — ED Notes (Signed)
 Patient noted hypotension on monitor. Sedation stopped. Verbal order received for 1L NS bolus MD Bradler.

## 2023-03-31 NOTE — ED Notes (Signed)
 Activated code stroke to [facility] george [id number]

## 2023-03-31 NOTE — ED Notes (Addendum)
 Code Stroke called to ED secretary and CT at this time. Charge RN aware.

## 2023-03-31 NOTE — ED Notes (Signed)
 Pt transported to CT via wheelchair by [facility].

## 2023-03-31 NOTE — ED Notes (Signed)
 Noted movement in L arm moving up abdomen. Sedation titrated for the same.

## 2023-03-31 NOTE — ED Notes (Signed)
[facility] arrival. 

## 2023-03-31 NOTE — ED Notes (Signed)
 EMTALA reviewed by this RN, pt ready for transport, pt intubated and sedated, family  gave consent prior to leaving [facility], Dr. Larinda Buttery obtained consent from family.

## 2023-03-31 NOTE — ED Notes (Signed)
 Report to Joni Reining, RN for transfer to [facility] RM 27

## 2023-03-31 NOTE — ED Triage Notes (Signed)
 Patient was pulled from car; daughter reports LSN [id number] but at 0900 patient became incoherent but could not give much information regarding that. At 0930 patient started foaming at mouth and then developed left facial droop and left arm and leg weakness. Code stroke called at triage, patient to CT scan with RN.

## 2023-03-31 NOTE — ED Notes (Signed)
[facility] departing with patient.

## 2023-03-31 NOTE — ED Notes (Signed)
 EEG tech at bedside.

## 2023-03-31 NOTE — Progress Notes (Signed)
 LTM EEG running - no initial skin breakdown - push button tested - neuro notified.  ATRIUM NOTIFIED HU charge captured

## 2023-03-31 NOTE — ED Provider Notes (Signed)
 [facility] Provider Note    Event Date/Time   First MD Initiated Contact with Patient 03/31/23 1230     (approximate)   History   Chief Complaint Code Stroke   HPI  Ann Chaney is a 78 y.o. female with a past medical history of hypertension, hyperlipidemia, diabetes, goiter, and meningioma status post resection who presents to the ED for code stroke.  Per family, patient was last seen well around 8:00 this morning, but seemed more disoriented and confused around 9 AM.  Family states that she then began having shaking activity with foaming at the mouth and left-sided weakness around 9:30 AM.  Family was able to load her into their car and bring her to the ED, staff subsequently called to the entrance of the ED to pull patient from the car.  At that time, she did not have any shaking activity but noted to be weak on the left side, code stroke was activated and she was brought immediately to the CT scanner.  While in CT scanner, patient developed left-sided seizure activity which was ongoing at the time of my initial assessment.      Physical Exam   Triage Vital Signs: ED Triage Vitals  Encounter Vitals Group     BP      Systolic BP Percentile      Diastolic BP Percentile      Pulse      Resp      Temp      Temp src      SpO2      Weight      Height      Head Circumference      Peak Flow      Pain Score      Pain Loc      Pain Education      Exclude from Growth Chart     Most recent vital signs: Vitals:   03/31/23 1325 03/31/23 1330  BP: (!) 250/138 (!) 225/143  Pulse: (!) 128 (!) 112  Resp: 18 16  Temp:    SpO2: 100% 100%    Constitutional: Somnolent, arousable to voice. Eyes: Conjunctivae are normal.  Pupils equal, round, reactive to light bilaterally. Head: Atraumatic. Neck: Large goiter noted. Nose: No congestion/rhinnorhea. Mouth/Throat: Mucous membranes are moist.  Cardiovascular: Tachycardic, regular rhythm. Grossly normal  heart sounds.  2+ radial pulses bilaterally. Respiratory: Normal respiratory effort.  No retractions. Lungs CTAB. Gastrointestinal: Soft and nontender. No distention. Musculoskeletal: No lower extremity tenderness nor edema.  Neurologic: Slurred speech with left-sided weakness.    ED Results / Procedures / Treatments   Labs (all labs ordered are listed, but only abnormal results are displayed) Labs Reviewed  CBC - Abnormal; Notable for the following components:      Result Value   RBC 5.50 (*)    MCV 71.1 (*)    MCH 22.9 (*)    Platelets 426 (*)    All other components within normal limits  COMPREHENSIVE METABOLIC PANEL - Abnormal; Notable for the following components:   Potassium 3.2 (*)    Glucose, Bld 149 (*)    All other components within normal limits  CBG MONITORING, ED - Abnormal; Notable for the following components:   Glucose-Capillary 138 (*)    All other components within normal limits  PROTIME-INR  APTT  DIFFERENTIAL  ETHANOL  MAGNESIUM   I-STAT CREATININE, ED  CBG MONITORING, ED     EKG  ED ECG REPORT I, Carlin  Teancum Brule, the attending physician, personally viewed and interpreted this ECG.   Date: 03/31/2023  EKG Time: 12:28  Rate: 98  Rhythm: normal sinus rhythm  Axis: Normal  Intervals:none  ST&T Change: None  RADIOLOGY CT head reviewed and interpreted by me with mass on the right, no significant midline shift.  PROCEDURES:  Critical Care performed: Yes, see critical care procedure note(s)  .Critical Care  Performed by: Willo Dunnings, MD Authorized by: Willo Dunnings, MD   Critical care provider statement:    Critical care time (minutes):  75   Critical care time was exclusive of:  Separately billable procedures and treating other patients and teaching time   Critical care was necessary to treat or prevent imminent or life-threatening deterioration of the following conditions:  CNS failure or compromise   Critical care was time spent  personally by me on the following activities:  Development of treatment plan with patient or surrogate, discussions with consultants, evaluation of patient's response to treatment, examination of patient, ordering and review of laboratory studies, ordering and review of radiographic studies, ordering and performing treatments and interventions, pulse oximetry, re-evaluation of patient's condition and review of old charts   I assumed direction of critical care for this patient from another provider in my specialty: no     Care discussed with: accepting provider at another facility   Procedure Name: Intubation Date/Time: 03/31/2023 1:45 PM  Performed by: Willo Dunnings, MDPre-anesthesia Checklist: Patient identified, Patient being monitored, Emergency Drugs available, Timeout performed and Suction available Oxygen Delivery Method: Non-rebreather mask Preoxygenation: Pre-oxygenation with 100% oxygen Induction Type: Rapid sequence Ventilation: Mask ventilation without difficulty Laryngoscope Size: Glidescope and 4 Grade View: Grade I Tube size: 7.5 mm Number of attempts: 1 Airway Equipment and Method: Video-laryngoscopy Placement Confirmation: ETT inserted through vocal cords under direct vision, CO2 detector and Breath sounds checked- equal and bilateral Secured at: 21 cm Tube secured with: ETT holder Dental Injury: Teeth and Oropharynx as per pre-operative assessment        MEDICATIONS ORDERED IN ED: Medications  propofol  (DIPRIVAN ) 1000 MG/100ML infusion (5 mcg/kg/min  55.6 kg Intravenous New Bag/Given 03/31/23 1328)  midazolam  (VERSED ) injection 1-2 mg (has no administration in time range)  fentaNYL  (SUBLIMAZE ) injection 25 mcg (has no administration in time range)  fentaNYL  (SUBLIMAZE ) injection 25-100 mcg (has no administration in time range)  fosPHENYtoin  (CEREBYX ) 1,100 mg PE in sodium chloride  0.9 % 50 mL IVPB (has no administration in time range)  clevidipine  (CLEVIPREX ) infusion  0.5 mg/mL (has no administration in time range)  phenytoin  (DILANTIN ) injection 100 mg (has no administration in time range)  levETIRAcetam  (KEPPRA ) IVPB 1000 mg/100 mL premix (has no administration in time range)  sodium chloride  flush (NS) 0.9 % injection 3 mL (3 mLs Intravenous Given 03/31/23 1222)  LORazepam  (ATIVAN ) injection 2 mg (2 mg Intravenous Given 03/31/23 1222)  levETIRAcetam  (KEPPRA ) IVPB 1000 mg/100 mL premix (0 mg Intravenous Stopped 03/31/23 1236)    Followed by  levETIRAcetam  (KEPPRA ) IVPB 1000 mg/100 mL premix (0 mg Intravenous Stopped 03/31/23 1236)  LORazepam  (ATIVAN ) injection 2 mg (2 mg Intravenous Given 03/31/23 1246)  levETIRAcetam  (KEPPRA ) IVPB 1000 mg/100 mL premix (0 mg Intravenous Stopped 03/31/23 1300)  LORazepam  (ATIVAN ) injection 2 mg (2 mg Intravenous Given 03/31/23 1301)  etomidate  (AMIDATE ) injection 10 mg (10 mg Intravenous Given 03/31/23 1307)  rocuronium  (ZEMURON ) injection 60 mg (60 mg Intravenous Given 03/31/23 1307)  dexamethasone  (DECADRON ) injection 10 mg (10 mg Intravenous Given 03/31/23 1331)  IMPRESSION / MDM / ASSESSMENT AND PLAN / ED COURSE  I reviewed the triage vital signs and the nursing notes.                              78 y.o. female with past medical history of hypertension, hyperlipidemia, diabetes, goiter, and meningioma s/p resection who presents to the ED for shaking and left-sided weakness noted at home.  Patient's presentation is most consistent with acute presentation with potential threat to life or bodily function.  Differential diagnosis includes, but is not limited to, stroke, TIA, seizure, SAH, anemia, electrolyte abnormality, AKI, meningitis.  Patient immediately brought back for CT imaging following triage, I assessed patient there along with Dr. Merrianne of neurology.  She was found to have ongoing left-sided focal seizure, seizure activity overall seem to slow and become less prominent with 2 mg of IV Ativan , however patient had  ongoing twitching of her left face concerning for ongoing focal seizure.  She had another more prominent left-sided seizure, was given additional IV Ativan  as well as 2 g of IV Keppra  without resolution.  CT head concerning for right temporal mass with vasogenic edema, will treat with IV Decadron  but low suspicion for stroke at this time.  In further discussion with neurology, patient given additional IV Ativan  as well as additional 1 g of Keppra  for ongoing left-sided facial twitching, unfortunately now having difficulty protecting her airway and given multiple doses of Ativan  with large goiter likely contributing.  Patient was intubated for airway protection, we will start on propofol  drip.  She is significantly hypertensive at this time, we will start patient on IV clevidipine  and give phenytoin  loading dose at the direction of neurology.  Case discussed with intensivist at [place] for transfer due to need for continuous EEG monitoring, patient accepted for transfer by Dr. Harold.  Labs show no significant anemia, leukocytosis, electrolyte abnormality, or AKI.  LFTs are also unremarkable.  Infection considered but seems less likely at this time, no meningismus noted.      FINAL CLINICAL IMPRESSION(S) / ED DIAGNOSES   Final diagnoses:  Status epilepticus (HCC)  Brain mass     Rx / DC Orders   ED Discharge Orders     None        Note:  This document was prepared using Dragon voice recognition software and may include unintentional dictation errors.   Willo Dunnings, MD 03/31/23 1359

## 2023-03-31 NOTE — ED Notes (Signed)
 MD Jessup aware of increase in BP. No new orders given at this time.

## 2023-03-31 NOTE — Procedures (Signed)
 Patient Name: Ann Chaney  MRN: [id number]  Epilepsy Attending: Arlin MALVA Krebs  Referring Physician/Provider: Merrianne Locus, MD  Date: 03/31/2023 Duration: 29.24 mins  Patient history: 78yo F with seizure like activity getting eeg to evaluate for seizure  Level of alertness: comatose  AEDs during EEG study: LEV, PHT,Propofol , Ativan   Technical aspects: This EEG study was done with scalp electrodes positioned according to the 10-20 International system of electrode placement. Electrical activity was reviewed with band pass filter of 1-70Hz , sensitivity of 7 uV/mm, display speed of 21mm/sec with a 60Hz  notched filter applied as appropriate. EEG data were recorded continuously and digitally stored.  Video monitoring was available and reviewed as appropriate.  Description: EEG showed continuous generalized and lateralized right hemisphere 3 to 6 Hz theta -delta slowing with overriding 13 to 15 Hz beta activity. Hyperventilation and photic stimulation were not performed.     ABNORMALITY - Continuous slow, generalized and lateralized right hemisphere  IMPRESSION: This study is suggestive of cortical dysfunction in right hemisphere likely secondary to underlying structural abnormality, postictal state.  Additionally there is severe diffuse encephalopathy, likely related to sedation.  No seizures or epileptiform discharges were seen throughout the recording.  Naamah Boggess O Abbeygail Igoe

## 2023-03-31 NOTE — ED Notes (Signed)
 Pt to CT

## 2023-03-31 NOTE — Progress Notes (Signed)
 [facility]Link Physician-Brief Progress Note Patient Name: BEAUTY PLESS DOB: January 29, 1946 MRN: [id number]   Date of Service  03/31/2023  HPI/Events of Note  Patient is a 78 yo F w/ pertinent PMH large left frontal meningioma s/p crainiotomy w/ resection 01/2021, DMT2, HTN, HLD, glaucoma, hyperthyroidism presents to [facility] on 1/7 w/ status epilepticus.   Patient has normal vitals.  Intubated mechanically ventilated.  On norepinephrine  low-dose, loaded with Keppra , fosphenytoin  and low-dose propofol .  Studies consistent with hypokalemia labs  CT head with  2.5 cm possible mass in right superior temporal lobe w/ associated vasogenic edema; Slight further ex vacuo enlargement of the frontal horns of the lateral ventricles relate to progressive volume loss in the frontal lobes. ET tube and enteric tube in place in the stomach  eICU Interventions  Meningitis antibiotics in place, Steroids in place  Antiepileptic therapy with Keppra  and fosphenytoin   MRI brain with/without pending  Daily spontaneous awakening/breathing trials.  Currently on propofol /Precedex  and as needed fentanyl   DVT prophylaxis with heparin  subcutaneous GI prophylaxis with famotidine      Intervention Category Evaluation Type: New Patient Evaluation  Raven Harmes 03/31/2023, 7:34 PM

## 2023-03-31 NOTE — ED Notes (Signed)
 Two family members at the bedside. Plan of care explained by MD Larinda Buttery and also this nurse. Patient family requested that if no bed availability continues and transfer to another facility is began that they would prefer [facility].

## 2023-03-31 NOTE — Progress Notes (Signed)
 Pharmacy Antibiotic Note  Ann Chaney is a 78 y.o. female admitted on 03/31/2023 with aspiration pneumonia.  Pharmacy has been consulted for Unasyn  dosing.  Plan: Unasyn  1.5g IV q6h Follow up renal function, cultures as available, clinical progress, length of tx  Height: 5' 4 (162.6 cm) Weight: 34.6 kg (76 lb 4.5 oz) IBW/kg (Calculated) : 54.7  Temp (24hrs), Avg:97.1 F (36.2 C), Min:96.7 F (35.9 C), Max:97.8 F (36.6 C)  Recent Labs  Lab 03/31/23 1227  WBC 6.1  CREATININE 0.66    Estimated Creatinine Clearance: 32.7 mL/min (by C-G formula based on SCr of 0.66 mg/dL).    Allergies  Allergen Reactions   Lipitor [Atorvastatin ] Other (See Comments)    Myalgias   Pravachol  [Pravastatin  Sodium] Nausea Only    Antimicrobials this admission: Unasyn  1/7 >>  Dose adjustments this admission:  Microbiology results: 1/7 MRSA PCR:   Thank you for allowing pharmacy to be a part of this patient's care.  Rocky Slade, PharmD, BCPS 03/31/2023 7:58 PM  Please check AMION for all [facility] Pharmacy phone numbers After 10:00 PM, call Main Pharmacy [phone]

## 2023-03-31 NOTE — ED Notes (Signed)
 Report to [facility] with [facility] completed.

## 2023-03-31 NOTE — Progress Notes (Signed)
 MRI brain  Radiology report pending but concerning for mass, likely metastasis on my review  CT Chest/abodomen/pelvis with contrast ordered to evaluate for primary  Neurology will continue to follow along

## 2023-03-31 NOTE — ED Notes (Signed)
 Pt accepted to [facility] bed 27 per Greggory Stallion, coordinator, call report to [phone]. Accepting Dr. Merrily Pew. [facility] on the way now

## 2023-03-31 NOTE — ED Notes (Signed)
 Called [facility] for transfer to [facility] for Neuro ICU

## 2023-03-31 NOTE — ED Notes (Signed)
 Called [facility] for update. Waiting for a room to become available.

## 2023-03-31 NOTE — Progress Notes (Addendum)
 CODE STROKE- PHARMACY COMMUNICATION   Time CODE STROKE called/page received: 1/7@1209   Time response to CODE STROKE was made (in person or via phone): in-person  Time Stroke Kit retrieved from Pyxis (only if needed): No TNK given  Name of Provider/Nurse contacted: NA  Past Medical History:  Diagnosis Date   Diabetes mellitus without complication (HCC)    Glaucoma    Glaucoma    Hyperlipidemia    Hypertension    Prior to Admission medications   Medication Sig Start Date End Date Taking? Authorizing Provider  ACCU-CHEK AVIVA PLUS test strip 1 each daily. 06/24/19   [provider]  Accu-Chek Softclix Lancets lancets  06/24/19   [provider]  acetaminophen  (TYLENOL ) 500 MG tablet Take 500 mg by mouth every 6 (six) hours as needed (pain).    [provider]  ezetimibe  (ZETIA ) 10 MG tablet Take 1 tablet (10 mg total) by mouth daily. 11/27/22   Joshua Cathryne BROCKS, MD  ferrous sulfate  325 (65 FE) MG tablet Take 1 tablet (325 mg total) by mouth daily with breakfast. 05/27/22   Joshua Cathryne BROCKS, MD  latanoprost  (XALATAN ) 0.005 % ophthalmic solution Place 1 drop into both eyes at bedtime.    [provider]  lisinopril  (ZESTRIL ) 40 MG tablet TAKE 1 TABLET BY MOUTH DAILY 02/28/23   Jones, Deanna C, MD  melatonin 3 MG TABS tablet Take 1 tablet (3 mg total) by mouth at bedtime. 02/07/21   Love, Sharlet RAMAN, PA-C  metFORMIN  (GLUCOPHAGE ) 1000 MG tablet Take 1 tablet (1,000 mg total) by mouth 2 (two) times daily with a meal. 05/27/22   Joshua Cathryne BROCKS, MD  methimazole  (TAPAZOLE ) 5 MG tablet Take 0.5 tablets (2.5 mg total) by mouth in the morning. 12/09/21   Joshua Cathryne BROCKS, MD  metoprolol  succinate (TOPROL -XL) 50 MG 24 hr tablet TAKE 1 TABLET BY MOUTH DAILY  WITH OR IMMEDIATELY FOLLOWING A  MEAL 12/26/22   Jones, Deanna C, MD  Multiple Vitamin (MULTIVITAMIN WITH MINERALS) TABS tablet Take 1 tablet by mouth daily.    [provider]  pioglitazone  (ACTOS ) 15 MG tablet TAKE  1 TABLET BY MOUTH DAILY 10/10/22   Joshua Cathryne BROCKS, MD  rosuvastatin  (CRESTOR ) 5 MG tablet TAKE 1 TABLET BY MOUTH ONCE A  WEEK 03/16/23   Joshua Cathryne BROCKS, MD  traZODone  (DESYREL ) 50 MG tablet Take 0.5 tablets (25 mg total) by mouth at bedtime. 11/27/22   Joshua Cathryne BROCKS, MD   Thank you for involving pharmacy in this patient's care.   Damien Napoleon, PharmD Clinical Pharmacist 03/31/2023 12:32 PM

## 2023-03-31 NOTE — ED Notes (Signed)
Cbg 138 °

## 2023-03-31 NOTE — Progress Notes (Addendum)
 1207 Stroke cart activated and elert sent to [facility]. Pt is in CT currently. Per daughter, pt woke up normal this AM and around 0900 she seemed a little off, went to [employer] and started gagging and choking. Upon arrival, pt noted to have L side weakness and a L facial droop. LKW 0800 1210 Dr. Merrianne with [facility] paged. 1212 pt seen to be having seizure-like activity in CT scanner.  1213 Dr. Lindzen at bedside in CT and assessing pt. Pt noted to still be having seizure-like activity and unable to respond or follow commands.  1218 No further needs from [facility] per Dr. Lindzen at this time. [facility] disconnected from cart.

## 2023-03-31 NOTE — ED Notes (Signed)
 First Nurse Note: Patient pulled out of car by Efraim Kaufmann, RN. Family reports stroke like symptoms-  left sided weakness and facial droop. LWK 0800.

## 2023-03-31 NOTE — Progress Notes (Signed)
   03/31/23 1238  Spiritual Encounters  Type of Visit Initial  Care provided to: Family;Pt not available  Referral source Code page  Reason for visit Code  OnCall Visit Yes  Spiritual Framework  Family Stress Factors Major life changes  Interventions  Spiritual Care Interventions Made Established relationship of care and support;Compassionate presence;Prayer  Intervention Outcomes  Outcomes Connection to spiritual care;Awareness of support  Spiritual Care Plan  Spiritual Care Issues Still Outstanding Chaplain will continue to follow  Advance Directives (For Healthcare)  Does Patient Have a Medical Advance Directive? Yes  Does patient want to make changes to medical advance directive? No - Patient declined  Type of Advance Directive Healthcare Power of Attorney  Copy of Healthcare Power of Attorney in Chart? No - copy requested  Mental Health Advance Directives  Does Patient Have a Mental Health Advance Directive? No  Would patient like information on creating a mental health advance directive? No - Patient declined   Spoke with the patient's family patient was receiving CT scan. Prayer with the patient's daughter and sister during my visit. We revisit patient's room later after the medical team has completed their assessment of the patient's.

## 2023-03-31 NOTE — Progress Notes (Signed)
 Eeg done

## 2023-03-31 NOTE — ED Notes (Addendum)
 1207 Arrival to CT 1213 Neurology and EDP in CT 1217 Neuro made decision no TPA 1220 Patient back to room 1 with RN and Dr. Larinda Buttery

## 2023-04-01 ENCOUNTER — Inpatient Hospital Stay (HOSPITAL_COMMUNITY): Payer: Medicare Other

## 2023-04-01 ENCOUNTER — Inpatient Hospital Stay (HOSPITAL_COMMUNITY): Payer: Medicare Other | Admitting: Anesthesiology

## 2023-04-01 ENCOUNTER — Encounter (HOSPITAL_COMMUNITY): Admission: AD | Disposition: E | Payer: Self-pay | Source: Other Acute Inpatient Hospital | Attending: Internal Medicine

## 2023-04-01 DIAGNOSIS — Z87891 Personal history of nicotine dependence: Secondary | ICD-10-CM

## 2023-04-01 DIAGNOSIS — G40901 Epilepsy, unspecified, not intractable, with status epilepticus: Secondary | ICD-10-CM

## 2023-04-01 DIAGNOSIS — E785 Hyperlipidemia, unspecified: Secondary | ICD-10-CM | POA: Diagnosis not present

## 2023-04-01 DIAGNOSIS — I639 Cerebral infarction, unspecified: Secondary | ICD-10-CM

## 2023-04-01 DIAGNOSIS — C3432 Malignant neoplasm of lower lobe, left bronchus or lung: Secondary | ICD-10-CM

## 2023-04-01 DIAGNOSIS — I10 Essential (primary) hypertension: Secondary | ICD-10-CM | POA: Diagnosis not present

## 2023-04-01 DIAGNOSIS — E440 Moderate protein-calorie malnutrition: Secondary | ICD-10-CM | POA: Insufficient documentation

## 2023-04-01 HISTORY — PX: BRONCHIAL NEEDLE ASPIRATION BIOPSY: SHX5106

## 2023-04-01 HISTORY — PX: VIDEO BRONCHOSCOPY WITH ENDOBRONCHIAL ULTRASOUND: SHX6177

## 2023-04-01 LAB — BASIC METABOLIC PANEL
Anion gap: 15 (ref 5–15)
Anion gap: 13 (ref 5–15)
BUN: 14 mg/dL (ref 8–23)
BUN: 15 mg/dL (ref 8–23)
CO2: 17 mmol/L — ABNORMAL LOW (ref 22–32)
CO2: 18 mmol/L — ABNORMAL LOW (ref 22–32)
Calcium: 9.2 mg/dL (ref 8.9–10.3)
Calcium: 8.7 mg/dL — ABNORMAL LOW (ref 8.9–10.3)
Chloride: 106 mmol/L (ref 98–111)
Chloride: 110 mmol/L (ref 98–111)
Creatinine, Ser: 0.92 mg/dL (ref 0.44–1.00)
Creatinine, Ser: 0.77 mg/dL (ref 0.44–1.00)
GFR, Estimated: >60 mL/min (ref >60)
GFR, Estimated: >60 mL/min (ref >60)
Glucose, Bld: 160 mg/dL — ABNORMAL HIGH (ref 70–99)
Glucose, Bld: 137 mg/dL — ABNORMAL HIGH (ref 70–99)
Potassium: 3.8 mmol/L (ref 3.5–5.1)
Potassium: 5 mmol/L (ref 3.5–5.1)
Sodium: 138 mmol/L (ref 135–145)
Sodium: 141 mmol/L (ref 135–145)

## 2023-04-01 LAB — CBC
HCT: 36 % (ref 36.0–46.0)
Hemoglobin: 11.7 g/dL — ABNORMAL LOW (ref 12.0–15.0)
MCH: 23.2 pg — ABNORMAL LOW (ref 26.0–34.0)
MCHC: 32.5 g/dL (ref 30.0–36.0)
MCV: 71.4 fL — ABNORMAL LOW (ref 80.0–100.0)
Platelets: 352 10*3/uL (ref 150–400)
RBC: 5.04 MIL/uL (ref 3.87–5.11)
RDW: 15 % (ref 11.5–15.5)
WBC: 11.6 10*3/uL — ABNORMAL HIGH (ref 4.0–10.5)
nRBC: 0 % (ref 0.0–0.2)

## 2023-04-01 LAB — GLUCOSE, CAPILLARY
Glucose-Capillary: 107 mg/dL — ABNORMAL HIGH (ref 70–99)
Glucose-Capillary: 129 mg/dL — ABNORMAL HIGH (ref 70–99)
Glucose-Capillary: 136 mg/dL — ABNORMAL HIGH (ref 70–99)
Glucose-Capillary: 141 mg/dL — ABNORMAL HIGH (ref 70–99)
Glucose-Capillary: 171 mg/dL — ABNORMAL HIGH (ref 70–99)
Glucose-Capillary: 174 mg/dL — ABNORMAL HIGH (ref 70–99)

## 2023-04-01 LAB — URINALYSIS, ROUTINE W REFLEX MICROSCOPIC
Bacteria, UA: NONE SEEN
Bilirubin Urine: NEGATIVE
Glucose, UA: 50 mg/dL — AB
Ketones, ur: NEGATIVE mg/dL
Leukocytes,Ua: NEGATIVE
Nitrite: NEGATIVE
Protein, ur: NEGATIVE mg/dL
Specific Gravity, Urine: 1.044 — ABNORMAL HIGH (ref 1.005–1.030)
pH: 6 (ref 5.0–8.0)

## 2023-04-01 LAB — RAPID URINE DRUG SCREEN, HOSP PERFORMED
Amphetamines: NOT DETECTED
Barbiturates: NOT DETECTED
Benzodiazepines: NOT DETECTED
Cocaine: NOT DETECTED
Opiates: NOT DETECTED
Tetrahydrocannabinol: NOT DETECTED

## 2023-04-01 LAB — PHOSPHORUS: Phosphorus: 3.2 mg/dL (ref 2.5–4.6)

## 2023-04-01 LAB — PROCALCITONIN
Procalcitonin: <0.1 ng/mL
Procalcitonin: <0.1 ng/mL

## 2023-04-01 LAB — MAGNESIUM: Magnesium: 2.1 mg/dL (ref 1.7–2.4)

## 2023-04-01 LAB — TRIGLYCERIDES: Triglycerides: 83 mg/dL (ref <150)

## 2023-04-01 SURGERY — BRONCHOSCOPY, WITH EBUS
Anesthesia: General | Laterality: Left

## 2023-04-01 MED ORDER — PROPOFOL 10 MG/ML IV BOLUS
INTRAVENOUS | Status: DC | PRN
  Administered 2023-04-01: 50 mg via INTRAVENOUS

## 2023-04-01 MED ORDER — SUGAMMADEX SODIUM 200 MG/2ML IV SOLN
INTRAVENOUS | Status: DC | PRN
  Administered 2023-04-01: 50 mg via INTRAVENOUS

## 2023-04-01 MED ORDER — ORAL CARE MOUTH RINSE: 15.0000 mL | OROMUCOSAL | Status: DC | PRN

## 2023-04-01 MED ORDER — HEPARIN SODIUM (PORCINE) 5000 UNIT/ML IJ SOLN
5000.0000 [IU] | Freq: Two times a day (BID) | INTRAMUSCULAR | Status: DC
  Administered 2023-04-01 – 2023-04-05 (×8): 5000 [IU] via SUBCUTANEOUS
  Filled 2023-04-01 (×8): qty 1

## 2023-04-01 MED ORDER — PHENYLEPHRINE 80 MCG/ML (10ML) SYRINGE FOR IV PUSH (FOR BLOOD PRESSURE SUPPORT)
PREFILLED_SYRINGE | INTRAVENOUS | Status: DC | PRN
  Administered 2023-04-01 (×3): 160 ug via INTRAVENOUS

## 2023-04-01 MED ORDER — THIAMINE MONONITRATE 100 MG PO TABS
100.0000 mg | ORAL_TABLET | Freq: Every day | ORAL | Status: DC
  Administered 2023-04-02 – 2023-04-05 (×4): 100 mg
  Filled 2023-04-01 (×5): qty 1

## 2023-04-01 MED ORDER — SODIUM CHLORIDE 0.9 % IV SOLN: INTRAVENOUS | Status: DC | PRN

## 2023-04-01 MED ORDER — ORAL CARE MOUTH RINSE
15.0000 mL | OROMUCOSAL | Status: DC
  Administered 2023-04-01 – 2023-04-06 (×62): 15 mL via OROMUCOSAL

## 2023-04-01 MED ORDER — LEVETIRACETAM IN NACL 500 MG/100ML IV SOLN
500.0000 mg | Freq: Two times a day (BID) | INTRAVENOUS | Status: DC
  Administered 2023-04-01 – 2023-04-05 (×9): 500 mg via INTRAVENOUS
  Filled 2023-04-01 (×10): qty 100

## 2023-04-01 MED ORDER — DEXMEDETOMIDINE HCL IN NACL 400 MCG/100ML IV SOLN
0.0000 ug/kg/h | INTRAVENOUS | Status: DC
  Administered 2023-04-01: 0.1 ug/kg/h via INTRAVENOUS
  Administered 2023-04-02: 0.5 ug/kg/h via INTRAVENOUS
  Administered 2023-04-02: 0.3 ug/kg/h via INTRAVENOUS
  Administered 2023-04-02: 0.5 ug/kg/h via INTRAVENOUS
  Administered 2023-04-03: 0.8 ug/kg/h via INTRAVENOUS
  Administered 2023-04-04: 1.2 ug/kg/h via INTRAVENOUS
  Administered 2023-04-04: 0.9 ug/kg/h via INTRAVENOUS
  Administered 2023-04-05: 0.8 ug/kg/h via INTRAVENOUS
  Filled 2023-04-01: qty 200
  Filled 2023-04-01: qty 100
  Filled 2023-04-01: qty 200
  Filled 2023-04-01 (×3): qty 100
  Filled 2023-04-01: qty 200

## 2023-04-01 MED ORDER — OSMOLITE 1.2 CAL PO LIQD
1000.0000 mL | ORAL | Status: DC
  Administered 2023-04-01 – 2023-04-05 (×4): 1000 mL

## 2023-04-01 MED ORDER — IOHEXOL 350 MG/ML SOLN
75.0000 mL | Freq: Once | INTRAVENOUS | Status: AC | PRN
  Administered 2023-04-01: 75 mL via INTRAVENOUS

## 2023-04-01 MED ORDER — METHIMAZOLE 2.5 MG HALF TABLET
2.5000 mg | ORAL_TABLET | Freq: Every day | ORAL | Status: DC
Start: 2023-04-01 — End: 2023-04-05
  Administered 2023-04-01 – 2023-04-05 (×5): 2.5 mg
  Filled 2023-04-01 (×5): qty 1

## 2023-04-01 MED ORDER — ROCURONIUM BROMIDE 10 MG/ML (PF) SYRINGE
PREFILLED_SYRINGE | INTRAVENOUS | Status: DC | PRN
  Administered 2023-04-01: 50 mg via INTRAVENOUS

## 2023-04-01 MED ORDER — PHENYLEPHRINE HCL-NACL 20-0.9 MG/250ML-% IV SOLN
INTRAVENOUS | Status: DC | PRN
  Administered 2023-04-01: 50 ug/min via INTRAVENOUS

## 2023-04-01 SURGICAL SUPPLY — 31 items
ADAPTER VALVE BIOPSY EBUS (MISCELLANEOUS) IMPLANT
BRUSH CYTOL CELLEBRITY 1.5X140 (MISCELLANEOUS) IMPLANT
CANISTER SUCT 3000ML PPV (MISCELLANEOUS) ×2 IMPLANT
CONT SPEC 4OZ CLIKSEAL STRL BL (MISCELLANEOUS) ×2 IMPLANT
COVER BACK TABLE 60X90IN (DRAPES) ×2 IMPLANT
FORCEPS BIOP RJ4 1.8 (CUTTING FORCEPS) IMPLANT
GAUZE SPONGE 4X4 12PLY STRL (GAUZE/BANDAGES/DRESSINGS) ×2 IMPLANT
GLOVE BIO SURGEON STRL SZ7.5 (GLOVE) ×2 IMPLANT
GOWN STRL REUS W/ TWL LRG LVL3 (GOWN DISPOSABLE) ×2 IMPLANT
GOWN STRL REUS W/ TWL XL LVL3 (GOWN DISPOSABLE) ×2 IMPLANT
KIT CLEAN ENDO COMPLIANCE (KITS) ×4 IMPLANT
KIT TURNOVER KIT B (KITS) ×2 IMPLANT
MARKER SKIN DUAL TIP RULER LAB (MISCELLANEOUS) ×2 IMPLANT
NDL ASPIRATION VIZISHOT 19G (NEEDLE) IMPLANT
NDL ASPIRATION VIZISHOT 21G (NEEDLE) IMPLANT
NEEDLE ASPIRATION VIZISHOT 19G (NEEDLE) IMPLANT
NEEDLE ASPIRATION VIZISHOT 21G (NEEDLE) IMPLANT
NS IRRIG 1000ML POUR BTL (IV SOLUTION) ×2 IMPLANT
OIL SILICONE PENTAX (PARTS (SERVICE/REPAIRS)) ×2 IMPLANT
PAD ARMBOARD 7.5X6 YLW CONV (MISCELLANEOUS) ×4 IMPLANT
SYR 20ML ECCENTRIC (SYRINGE) ×4 IMPLANT
SYR 20ML LL LF (SYRINGE) ×4 IMPLANT
SYR 50ML SLIP (SYRINGE) IMPLANT
SYR 5ML LUER SLIP (SYRINGE) ×2 IMPLANT
TOWEL GREEN STERILE FF (TOWEL DISPOSABLE) ×2 IMPLANT
TRAP SPECIMEN MUCOUS 40CC (MISCELLANEOUS) IMPLANT
TUBE CONNECTING 20X1/4 (TUBING) ×4 IMPLANT
UNDERPAD 30X30 (UNDERPADS AND DIAPERS) ×2 IMPLANT
VALVE BIOPSY SINGLE USE (MISCELLANEOUS) ×2 IMPLANT
VALVE SUCTION BRONCHIO DISP (MISCELLANEOUS) ×2 IMPLANT
WATER STERILE IRR 1000ML POUR (IV SOLUTION) ×2 IMPLANT

## 2023-04-01 NOTE — Progress Notes (Signed)
 Initial Nutrition Assessment  DOCUMENTATION CODES:  Underweight, Non-severe (moderate) malnutrition in context of chronic illness  INTERVENTION:  Initiate tube feeding via OGT: Osmolite 1.2 at 50 ml/h (1200 ml per day) Start at 20 and advance by 10mL q12h to goal of 50 Provides 1440 kcal, 67 gm protein, 984 ml free water daily Monitor magnesium  and phosphorus every 12 hours x 4 occurrences, MD to replete as needed, as pt is at risk for refeeding syndrome given underweight BMI. Thiamine  100mg  x 5 days  NUTRITION DIAGNOSIS:  Moderate Malnutrition related to chronic illness as evidenced by moderate fat depletion, severe muscle depletion.  GOAL:  Patient will meet greater than or equal to 90% of their needs  MONITOR:  Vent status, Labs, Weight trends, TF tolerance  REASON FOR ASSESSMENT:  Ventilator, Consult (underweight) Enteral/tube feeding initiation and management  ASSESSMENT:  Pt with hx of DM, HLD, HTN, hyperthyroidism, hx meningioma causing seizure s/p resection 2022, and glaucoma presented to ED with AMS and seizure like activity. Imaging in ED showed new 2.5 cm abnormality in the right temporal lobe.   1/7 - presented to [facility] ED, intubated, transferred to [facility]   Patient is currently intubated on ventilator support. No family at the time of assessment to provide a hx. Transport team arrived during assessment to take pt for biopsy.   Once pt returns, provider ok to feed. Discussed with RN and recommendations entered. Pt with muscle and fat deficits present on exam. Unsure if current weight is accurate as visually it does not appear accurate. Current weight significantly different than admission weight.   Will initiate and advance TF slowly as pt is at risk for refeeding syndrome if underweight status is accurate.  MV: 6.8 L/min Temp (24hrs), Avg:97.2 F (36.2 C), Min:96.4 F (35.8 C), Max:98.7 F (37.1 C)  Admit weight: 34.6 kg  Current weight: 39.3 kg 24% weight  loss x 4 months if accurate. ? accuracy   Intake/Output Summary (Last 24 hours) at 04/01/2023 1529 Last data filed at 04/01/2023 1500 Gross per 24 hour  Intake 438.38 ml  Output 950 ml  Net -511.62 ml  Net IO Since Admission: -511.62 mL [04/01/23 1529]  Drains/Lines: OGT 20F UOP 785 mL x 24 hours   Nutritionally Relevant Medications: Scheduled Meds:  dexamethasone   4 mg Per Tube Q6H   docusate  100 mg Per Tube BID   famotidine   20 mg Per Tube BID   insulin  aspart  0-15 Units Subcutaneous Q4H   polyethylene glycol  17 g Per Tube Daily   Continuous Infusions:  ampicillin -sulbactam (UNASYN ) IV 1.5 g (04/01/23 0810)   levETIRAcetam  500 mg (04/01/23 0912)   propofol  (DIPRIVAN ) infusion Stopped (04/01/23 0031)   PRN Meds:.docusate, polyethylene glycol  Labs Reviewed: CBG ranges from 129-209 mg/dL over the last 24 hours HgbA1c 6.9% (3/26)  NUTRITION - FOCUSED PHYSICAL EXAM: Flowsheet Row Most Recent Value  Orbital Region Moderate depletion  Upper Arm Region Mild depletion  Thoracic and Lumbar Region Mild depletion  Buccal Region Moderate depletion  Temple Region Moderate depletion  Clavicle Bone Region Mild depletion  Clavicle and Acromion Bone Region Moderate depletion  Scapular Bone Region Severe depletion  Dorsal Hand Severe depletion  Patellar Region Severe depletion  Anterior Thigh Region Severe depletion  Posterior Calf Region Severe depletion  Edema (RD Assessment) None  Hair Reviewed  Eyes Reviewed  Mouth Reviewed  Skin Reviewed  Nails Reviewed    Diet Order:   Diet Order  Diet NPO time specified  Diet effective now                   EDUCATION NEEDS:  Not appropriate for education at this time  Skin:  Skin Assessment: Reviewed RN Assessment  Last BM:  unsure  Height:  Ht Readings from Last 1 Encounters:  03/31/23 5' 4 (1.626 m)    Weight:  Wt Readings from Last 1 Encounters:  04/01/23 42.4 kg    Ideal Body Weight:  54.5  kg  BMI:  Body mass index is 16.04 kg/m.  Estimated Nutritional Needs:  Kcal:  1300-1500 kcal/d Protein:  60-80 g/d Fluid:  >/=1.5L/d    Vernell Lukes, RD, LDN Registered Dietitian II Please reach out via secure chat Weekend on-call pager # available in [employer]

## 2023-04-01 NOTE — Progress Notes (Signed)
 LTM EEG discontinued - no skin breakdown at [facility].

## 2023-04-01 NOTE — Progress Notes (Signed)
 Ann Chaney able to give verbal consent over the phone at 10:39 am for procedure (Biopsy with EBUS guided biopsy) following the doctor's explanation to pt's daughter. Consent placed in pt's chart.

## 2023-04-01 NOTE — Progress Notes (Signed)
 Pt off to O.R. at this time accompanied by CRNA and two other staff.

## 2023-04-01 NOTE — Anesthesia Postprocedure Evaluation (Signed)
 Anesthesia Post Note  Patient: Ann Chaney  Procedure(s) Performed: VIDEO BRONCHOSCOPY WITH ENDOBRONCHIAL ULTRASOUND (Left) BRONCHIAL NEEDLE ASPIRATION BIOPSIES     Patient location during evaluation: SICU Anesthesia Type: General Level of consciousness: sedated Pain management: pain level controlled Vital Signs Assessment: post-procedure vital signs reviewed and stable Respiratory status: patient remains intubated per anesthesia plan Cardiovascular status: stable Postop Assessment: no apparent nausea or vomiting Anesthetic complications: no   There were no known notable events for this encounter.  Last Vitals:  Vitals:   04/01/23 1900 04/01/23 2000  BP: (!) 90/54   Pulse: 88   Resp: 16   Temp:  36.8 C  SpO2: 100%     Last Pain:  Vitals:   04/01/23 2000  TempSrc: Axillary                 Ann Chaney

## 2023-04-01 NOTE — Progress Notes (Signed)
 Pt's TF not started during 0700-1900 shift, d/t tube placement results not resulted before 1900.

## 2023-04-01 NOTE — Procedures (Signed)
 Flexible and EBUS Bronchoscopy Procedure Note  [facility]  [id number]  05/28/1945  Date:04/01/23  Time:5:31 PM   Provider Performing:Arora Coakley C Claudene   Procedure: Flexible bronchoscopy and EBUS Bronchoscopy  Indication(s) Lung mass  Consent Risks of the procedure as well as the alternatives and risks of each were explained to the patient and/or caregiver.  Consent for the procedure was obtained.  Anesthesia General Anesthesia   Time Out Verified patient identification, verified procedure, site/side was marked, verified correct patient position, special equipment/implants available, medications/allergies/relevant history reviewed, required imaging and test results available.   Sterile Technique Usual hand hygiene, masks, gowns, and gloves were used   Procedure Description The EBUS bronchoscope was advanced into airway with left hilar mass biopsied and sent for slide, cell block, and/or culture.  The EBUS bronchoscope was removed after assuring no active bleeding from biopsy site.  Findings:  complex appearing (necrotic) left hilar mass   Complications/Tolerance None; patient tolerated the procedure well. Chest X-ray is not needed post procedure.   EBL Minimal   Specimen(s) L hilar mass slides and cell block

## 2023-04-01 NOTE — Progress Notes (Addendum)
 Spoke w/ P. Simpson, NP pertaining to pt's weight to medication dosing discrepancy solely based off weight collected at [facility] and that collected here [facility] upon admission. Lodema Hong stated that she would reach out to pharmacy.

## 2023-04-01 NOTE — Progress Notes (Signed)
 NEUROLOGY CONSULT FOLLOW UP NOTE   Date of service: April 01, 2023 Patient Name: Ann Chaney MRN:  [id number] DOB:  October 05, 1945  Brief HPI  Ann Chaney is a 78 y.o. female with past medical history significant for DM, hyperlipidemia, hypertension, current smoker, left meningioma resection October 2022 who presented to [facility] and complex partial status and was transferred to [facility] 1/7 for continuous EEG.   Family stated patient was found to be not speaking properly at 9 AM.  Patient then had acute onset of shaking with left facial droop and left upper and left lower extremity weakness and she was seen as a code stroke by Dr. Parthenia telemetry consult at [facility].   CT head revealed a new 2.5 cm right superior temporal lobe lesion, concerning for mass with edema.  Patient has no prior seizure history.  She was given Keppra  load of 3 g total, given Decadron  10 mg, fosphenytoin  loading dose and was given Ativan  x 3 doses.  She was intubated and transported to [facility].     Interval Hx/subjective   -MRI brain with and without contrast shows intracranial metastatic disease with largest lesion measuring up to 3.2 cm at the right temporal lobe.  Evidence of prior left frontal craniotomy for tumor resection seen. -LTM EEG read this morning is negative -CT chest abdomen pelvis with contrast shows a large necrotic mass in the left lung.  Patient is on 0.1 dose of Precedex  and is weaning on the ventilator.  RN at bedside.  On exam, she localizes in her upper extremities as symmetrical mild trauma to lower extremities she does not open eyes or follow commands.  Vitals   Vitals:   04/01/23 0730 04/01/23 0745 04/01/23 0800 04/01/23 0821  BP: (!) 158/65 123/60 116/66   Pulse: 75 73 69   Resp: 16 16 16    Temp: (!) 96.4 F (35.8 C)   (!) 97.3 F (36.3 C)  TempSrc: Axillary   Axillary  SpO2: 100% 100% 100%   Weight:      Height:         Body mass index is 14.87 kg/m.  Physical Exam    Constitutional: Critically ill female Eyes: No scleral injection.  HENT: No OP obstrucion. OG/ ET tube present Head: Normocephalic.  Cardiovascular: Normal rate and regular rhythm.  Respiratory: Mechanically ventilated, currently weaning. GI: Soft.  No distension. There is no tenderness.  Skin: WDI.   Neurologic Examination   Patient is intubated and sedated on 0.1 of Precedex .  She does not open eyes to voice or pain.  She does not follow commands.  She does localize to pain in her bilateral upper extremities, left greater than right.  She does have mild withdrawal to pain symmetrical in her lower extremities.  Medications  Current Facility-Administered Medications:    ampicillin -sulbactam (UNASYN ) 1.5 g in sodium chloride  0.9 % 100 mL IVPB, 1.5 g, Intravenous, Q6H, Billy Rocky SAUNDERS, RPH, Last Rate: 200 mL/hr at 04/01/23 0810, 1.5 g at 04/01/23 0810   Chlorhexidine  Gluconate Cloth 2 % PADS 6 each, 6 each, Topical, Daily, Chand, Sudham, MD, 6 each at 03/31/23 1916   dexamethasone  (DECADRON ) 1 MG/ML solution 4 mg, 4 mg, Per Tube, Q6H **OR** dexamethasone  (DECADRON ) 1 MG/ML solution 4 mg, 4 mg, Oral, Q6H **OR** dexamethasone  (DECADRON ) injection 4 mg, 4 mg, Intravenous, Q6H, Bhagat, Srishti L, MD, 4 mg at 04/01/23 0615   dexmedetomidine  (PRECEDEX ) 400 MCG/100ML (4 mcg/mL) infusion, 0-1.2 mcg/kg/hr, Intravenous, Titrated, Tanda Powell ORN,  RPH   docusate (COLACE) 50 MG/5ML liquid 100 mg, 100 mg, Per Tube, BID PRN, Rosan Deward ORN, NP   docusate (COLACE) 50 MG/5ML liquid 100 mg, 100 mg, Per Tube, BID, Rosan Deward ORN, NP, 100 mg at 03/31/23 2122   famotidine  (PEPCID ) tablet 20 mg, 20 mg, Per Tube, BID, Rosan Deward ORN, NP, 20 mg at 03/31/23 2122   fentaNYL  (SUBLIMAZE ) injection 25 mcg, 25 mcg, Intravenous, Q15 min PRN, Rosan Deward ORN, NP   fentaNYL  (SUBLIMAZE ) injection 25-100 mcg, 25-100 mcg, Intravenous, Q30 min PRN, Rosan Deward ORN, NP   heparin  injection 5,000 Units, 5,000 Units,  Subcutaneous, Q8H, Rosan Deward ORN, NP, 5,000 Units at 04/01/23 0615   insulin  aspart (novoLOG ) injection 0-15 Units, 0-15 Units, Subcutaneous, Q4H, Rosan Deward ORN, NP, 2 Units at 04/01/23 0754   ipratropium-albuterol  (DUONEB) 0.5-2.5 (3) MG/3ML nebulizer solution 3 mL, 3 mL, Nebulization, Q6H, Rosan Deward ORN, NP, 3 mL at 04/01/23 0842   ipratropium-albuterol  (DUONEB) 0.5-2.5 (3) MG/3ML nebulizer solution 3 mL, 3 mL, Nebulization, Q6H PRN, Rosan Deward ORN, NP   levETIRAcetam  (KEPPRA ) IVPB 500 mg/100 mL premix, 500 mg, Intravenous, Q12H, Kirsten Spearing, MD   LORazepam  (ATIVAN ) injection 2 mg, 2 mg, Intravenous, Q4H PRN, Bhagat, Srishti L, MD   Oral care mouth rinse, 15 mL, Mouth Rinse, Q2H, Chand, Sudham, MD, 15 mL at 04/01/23 9278   Oral care mouth rinse, 15 mL, Mouth Rinse, PRN, Harold Scholz, MD   polyethylene glycol (MIRALAX  / GLYCOLAX ) packet 17 g, 17 g, Per Tube, Daily PRN, Rosan Deward ORN, NP   polyethylene glycol (MIRALAX  / GLYCOLAX ) packet 17 g, 17 g, Per Tube, Daily, Rosan Deward ORN, NP, 17 g at 03/31/23 2122   propofol  (DIPRIVAN ) 1000 MG/100ML infusion, 0-50 mcg/kg/min, Intravenous, Continuous, Rosan Deward ORN, NP, Stopped at 04/01/23 0031 Labs and Diagnostic Imaging   CBC:  Recent Labs  Lab 03/31/23 1227 03/31/23 2212 03/31/23 2309  WBC 6.1 7.5  --   NEUTROABS 2.4 6.2  --   HGB 12.6 12.4 12.6  HCT 39.1 39.5 37.0  MCV 71.1* 73.8*  --   PLT 426* 324  --     Basic Metabolic Panel:  Lab Results  Component Value Date   NA 138 04/01/2023   K 3.8 04/01/2023   CO2 17 (L) 04/01/2023   GLUCOSE 160 (H) 04/01/2023   BUN 14 04/01/2023   CREATININE 0.92 04/01/2023   CALCIUM  9.2 04/01/2023   GFRNONAA >60 04/01/2023   GFRAA 89 04/26/2019   Lipid Panel:  Lab Results  Component Value Date   LDLCALC 99 06/17/2022   HgbA1c:  Lab Results  Component Value Date   HGBA1C 6.9 06/17/2022   Urine Drug Screen:     Component Value Date/Time   LABOPIA NONE DETECTED 04/01/2023 0232    COCAINSCRNUR NONE DETECTED 04/01/2023 0232   LABBENZ NONE DETECTED 04/01/2023 0232   AMPHETMU NONE DETECTED 04/01/2023 0232   THCU NONE DETECTED 04/01/2023 0232   LABBARB NONE DETECTED 04/01/2023 0232    Alcohol  Level     Component Value Date/Time   ETH <10 03/31/2023 1227   INR  Lab Results  Component Value Date   INR 1.2 03/31/2023   APTT  Lab Results  Component Value Date   APTT 27 03/31/2023   AED levels:  Lab Results  Component Value Date   PHENYTOIN  22.3 (H) 03/31/2023    CT Head without contrast(Personally reviewed): -New 2.5 cm abnormality in the right superior temporal lobe with associated regional vasogenic  edema 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 - Prior left frontal craniotomy for tumor resection. - Underlying age-related cerebral atrophy advanced chronic small-vessel ischemic changes  cEEG 1/7: -Continuous slow, generalized moderate right hemisphere.  Study suggests subcortical dysfunction of right hemisphere likely secondary to underlying structural abnormality, postictal state.  Additionally severe diffuse encephalopathy, likely related to sedation.  No seizures or epileptiform discharges.  cEEG 1/8:  -Continuous slow, generalized and maximal right region.  Study suggestive of cortical dysfunction in the right temporal region likely secondary to underlying structural  abnormality/mass.  Additionally there is moderate to severe diffuse encephalopathy.  No seizures or left form discharges were seen throughout the recording  Assessment   Ann Chaney is a 78 y.o. female with past medical history significant for DM, hyperlipidemia, hypertension, current smoker, left meningioma resection October 2022 who presented to [facility] and complex partial status and was transferred to [facility] 1/7 for continuous EEG.  CT chest shows necrotic lung mass concerning for primary lung disease, MRI shows brain mass with edema concerning for metastatic intracranial disease.  Patient placed on prophylactic AEDs and on Decadron  for edema.  LTM EEG has been negative so far and no seizure-like activity has been seen.    Recommendations   - OK from neurology to wean and extubate - Wean Precedex  - D/C LTM - Continue 500mg  Keppra  BID prophylactic d/y high risk of seizures secondary to brain mass - Continue Decadron  4mg  Q6H due to edema seen on MRI  - Consult with neuro-oncology for further treatment and outpatient plan  Plan discussed with CCM ______________________________________________________________________   Pt seen by Neuro NP/APP and later by MD. Note/plan to be edited by MD as needed.    Rocky JAYSON Likes, DNP, AGACNP-BC [facility] Please use AMION for contact information & EPIC for messaging.   Attending Neurohospitalist Addendum Patient seen and examined with APP/Resident. Agree with the history and physical as documented above. Agree with the plan as documented, which I helped formulate. I have independently reviewed the chart, obtained history, review of systems and examined the patient.I have personally reviewed pertinent head/neck/spine imaging (CT/MRI). Please feel free to call with any questions.  -- Eligio Lav, MD Neurologist [facility] Pager: [phone]

## 2023-04-01 NOTE — Progress Notes (Signed)
 Spoke w/ the pt's daughter at this time w/ updates, but informed her that the doctors would be able to explain any results from CT and MRI. I asked if she had any other questions or concerns concluding this call and she replied that she had none.

## 2023-04-01 NOTE — Transfer of Care (Signed)
 Immediate Anesthesia Transfer of Care Note  Patient: Ann Chaney  Procedure(s) Performed: VIDEO BRONCHOSCOPY WITH ENDOBRONCHIAL ULTRASOUND (Left) BRONCHIAL NEEDLE ASPIRATION BIOPSIES  Patient Location: ICU  Anesthesia Type:General  Level of Consciousness: Patient remains intubated per anesthesia plan  Airway & Oxygen Therapy: Patient remains intubated per anesthesia plan  Post-op Assessment: Report given to RN and Post -op Vital signs reviewed and stable  Post vital signs: Reviewed and stable  Last Vitals:  Vitals Value Taken Time  BP 162/77 04/01/23 1640  Temp    Pulse 105 04/01/23 1654  Resp 26 04/01/23 1654  SpO2 100 % 04/01/23 1654  Vitals shown include unfiled device data.  Last Pain:  Vitals:   04/01/23 1509  TempSrc: Temporal         Complications: There were no known notable events for this encounter.

## 2023-04-01 NOTE — Progress Notes (Addendum)
 Pt noted to be in and out off ST elevation on the monitor following me notifying B. Antonetta, NP of such HR and s/p EKG collected (V.O., per Antonetta, NP). I informed the same and inquired if there should be another EKG collected if HR comes back. Waiting on response at this time.  Spoke w/ Dr. Harold during progression, per MD, ok not to get another EKG if pt's monitor reads ST elevation, will conti to monitor pt and collaborate w/ MD/ care team as warranted.

## 2023-04-01 NOTE — Progress Notes (Signed)
 vLTM maintenance  All impedances below 10k.  No skin breakdown noted under FP1 F8 FP2

## 2023-04-01 NOTE — Anesthesia Procedure Notes (Addendum)
 Procedure Name: Intubation Date/Time: 04/01/2023 3:13 PM  Performed by: Christopher Comings, CRNAPre-anesthesia Checklist: Patient identified, Emergency Drugs available, Suction available and Patient being monitored Patient Re-evaluated:Patient Re-evaluated prior to induction Oxygen Delivery Method: Circle system utilized Preoxygenation: Pre-oxygenation with 100% oxygen Induction Type: IV induction Ventilation: Mask ventilation without difficulty Laryngoscope Size: Mac and 4 Grade View: Grade I Tube type: Oral Tube size: 8.0 mm Number of attempts: 1 Airway Equipment and Method: Stylet and Oral airway Placement Confirmation: ETT inserted through vocal cords under direct vision, positive ETCO2 and breath sounds checked- equal and bilateral Secured at: 22 cm Tube secured with: Tape Dental Injury: Teeth and Oropharynx as per pre-operative assessment

## 2023-04-01 NOTE — Progress Notes (Signed)
 RT and RN x 2 transported patient to CT and back to 4N27 without event. Patient suctioned per VAP protocol.  RT will continue to monitor.

## 2023-04-01 NOTE — Plan of Care (Signed)
  Problem: Nutrition: Goal: Adequate nutrition will be maintained Outcome: Progressing   Problem: Education: Goal: Expressions of having a comfortable level of knowledge regarding the disease process will increase Outcome: Progressing   Problem: Clinical Measurements: Goal: Complications related to the disease process, condition or treatment will be avoided or minimized Outcome: Progressing   Problem: Safety: Goal: Non-violent Restraint(s) Outcome: Progressing

## 2023-04-01 NOTE — Progress Notes (Signed)
 Confirmed w/ P. Simpson, NP (CCM), "BP <160 and MAP >65", per verbal.

## 2023-04-01 NOTE — Progress Notes (Signed)
 Luekens trap placed on inline suction for sputum sample. Currently when suctioned patient does not have secretions.  Will leave on until sample obtained.

## 2023-04-01 NOTE — Procedures (Addendum)
 Patient Name: Ann Chaney  MRN: [id number]  Epilepsy Attending: Arlin MALVA Krebs  Referring Physician/Provider: Jerrie Lola CROME, MD  Duration: 1/7/20525 2350 to 04/01/2023 1135  Patient history: 77yo F. She was last seen normal at 8 AM and subsequently found to be not speaking properly at 9 AM. Approximately 30 minutes later began to phone at the mouth with shaking and left facial droop with left upper and lower extremity weakness, seen as a code stroke by Dr. Merrianne. She had further witnessed seizure activity on the left side and the CT scanner with maintained awareness and ability to follow some simple right sided motor commands. EEG to evaluate for seizure  Level of alertness: comatose/ lethargic   AEDs during EEG study: LEV, Propofol   Technical aspects: This EEG study was done with scalp electrodes positioned according to the 10-20 International system of electrode placement. Electrical activity was reviewed with band pass filter of 1-70Hz , sensitivity of 7 uV/mm, display speed of 28mm/sec with a 60Hz  notched filter applied as appropriate. EEG data were recorded continuously and digitally stored.  Video monitoring was available and reviewed as appropriate.  Description: EEG showed continuous generalized and maximal right temporal region polymorphic sharply contoured 3 to 7 Hz theta-delta slowing.  Hyperventilation and photic stimulation were not performed.     ABNORMALITY - Continuous slow, generalized and maximal right temporal region  IMPRESSION: This study is suggestive of cortical dysfunction in right temporal region likely secondary to underlying structural abnormality/mass.  Additionally there is moderate to severe diffuse encephalopathy.  No seizures or epileptiform discharges were seen throughout the recording.  Ysabela Keisler O Noelani Harbach

## 2023-04-01 NOTE — Progress Notes (Signed)
 NAME:  Ann Chaney, MRN:  [id number], DOB:  [birth year], LOS: 1 ADMISSION DATE:  03/31/2023, CONSULTATION DATE:  1/7 REFERRING MD:  Dr. Willo EDP, CHIEF COMPLAINT:  Status epilepticus    History of Present Illness:  78 year old female with past medical history as below, which is significant for hypertension, diabetes, glaucoma, hyperthyroidism with goiter, meningioma causing seizure status post resection in 2022. She was in her usual state of health living with her daughter but quite independent on 1/7 when she developed alered mental status and facial droop with left sided weakness. She also had shaking activity and foaming at the mouth. Family brought her to [facility] ED by personal vehicle. Upon arrival to the ED a code stroke was initiated.  She was sent urgently for CT of the head and immediately following the scan the patient began to experience spasmodic, repetitive, and rhythmic left facial twitching and foaming at the mouth.  This resolved spontaneously after about 2 minutes.  She was given Ativan  and Keppra  load.  CT showed a new 2.5 cm abnormality in the right superior temporal lobe with surrounding vasogenic edema.  She was intubated ultimately for airway protection.  Spot EEG was negative for active seizure.  She was transferred to [facility] for ICU admission and continuous EEG monitoring.  Pertinent  Medical History   has a past medical history of Diabetes mellitus without complication (HCC), Glaucoma, Glaucoma, Hyperlipidemia, and Hypertension.   Significant Hospital Events: Including procedures, antibiotic start and stop dates in addition to other pertinent events   1/7 tx from [facility] ER for AMS, seizures, intubated    Interim History / Subjective:  No clinical seizures, remains on LTM Remains on minimal precedex , withdrawing only  Objective   Blood pressure 116/66, pulse 69, temperature (!) 96.4 F (35.8 C), temperature source Axillary, resp. rate 16, height 5' 4 (1.626 m),  weight 39.3 kg, SpO2 100%.    Vent Mode: PRVC FiO2 (%):  [40 %] 40 % Set Rate:  [16 bmp] 16 bmp Vt Set:  [440 mL-450 mL] 440 mL PEEP:  [5 cmH20] 5 cmH20 Plateau Pressure:  [18 cmH20-20 cmH20] 19 cmH20   Intake/Output Summary (Last 24 hours) at 04/01/2023 0815 Last data filed at 04/01/2023 0600 Gross per 24 hour  Intake 225.58 ml  Output 785 ml  Net -559.42 ml   Filed Weights   03/31/23 1913 04/01/23 0600  Weight: 34.6 kg 39.3 kg    Examination: General:  AoC ill and cachetic female lying in bed in NAD on bair hugger HEENT: MM pink/moist, ETT/ OGT, pupils 3/r Neuro: does not open eyes, moves BUE spont, w/d in LE, not follow commands CV: rr, NSR, no murmur PULM:  PSV 10/5 tolerating well, coarse, rhonchi on left, diminished in bases, no secretions, good cough GI: soft, bs+, NT, foley- cyu Extremities: warm/dry, no LE edema  Skin: no rashes   Temp 96.4 overnight requiring bairhugger Labs> CBG range 187> 136, K 3.8, bicarb 17, sCr 0.92, triglycerides 83, WBC 11.6  1/8 MRI brain> 1. Findings consistent with intracranial metastatic disease, with the largest lesion measuring up to 3.2 cm at the right temporal lobe. 2. Prior left frontal craniotomy for tumor resection. No evidence for locally recurrent tumor at this location. 3. Irregular flow void within the V4 segments and proximal basilar artery, which could be related to slow flow and/or occlusion. 4. Underlying age-related cerebral atrophy with advanced chronic microvascular ischemic disease.  1/8 CT c/a/p w/contrast> Large necrotic appearing mass seen in  the left AP window/hilum and possibly adjacent central left upper lobe concerning for malignancy.  Obstructive process in the left upper lobe, likely obstructive left upper lobe pneumonia.  Multiple bilateral pulmonary nodules most compatible with metastases. Wedge-shaped low-density within the spleen may reflect splenic infarct. Mottled appearance of the left side of the  sacrum, left iliac bone with areas of lytic bone destruction compatible with metastases.  Mottled/sclerotic appearance within the right ischium may reflect metastasis as well.  Gallbladder wall wall thickening versus pericholecystic fluid. No visible stones or biliary ductal dilatation. Aortic atherosclerosis.  Resolved Hospital Problem list     Assessment & Plan:   Right temporal lobe brain mass with vasogenic edema - prior meningoma s/p L frontal resection in 2022 in  Focal status epilepticus  Acute encephalopathy, likely postictal state +/- infectious with LUL PNA - Appreciate Neurology input - AEDS/ LTM per neurology.  EEG negative overnight for seizures/ epileptiform discharges.  Cortical dysfunction from right temporal region likely 2/2 underlying mass  - seizure precautions - ativan  prn seizures - cont decadron  taper - serial neuro exams - hold on NSGY consult for now  LUL vs mediastinum/ hilum necrotic mass Multiple bilateral lung nodules - no prior chest imaging over 62yrs - arranging EBUS today w/Dr. Claudene for tissue biopsy while intubated  Metastatic disease process- new brain mass, lung vs mediastinal/ hilar necrotic mass with bilateral and multiple pulmonary nodules and evidence of osseous destruction in left sacrum and left iliac, possible right ischium.   - workup as above - Dr. Roderic with oncology consulted   - possible lung primary given smoking hx, hx of thyroid  goiter, FNA from 11/4 cytology pending in care everywhere  Acute respiratory failure with hypoxia secondary to above Left upper lobe pneumonia, obstructive 2/2 lung vs mediastinum/ hilum mass - tolerating PSV but mental status remains barrier to extubation. Full MV support prn - leave intubated today for EBUS - VAP/ PPI - PAD protocol> precedex  prn, prn fentanyl .  Minimize as able - trach asp when able> no sputum thus far - empiric unasyn , PCT neg - prn BD  Diabetes mellitus - CBG q 4, SSI prn w/  steroids - holding pta metformin   Hypertension - hold pta meds, has been on lower end of SBP, sedation likely contributing  Hyperthyroidism with goiter - being followed by [facility] by Dr. Luke.  S/p FNA 11/4, cytology not available in care everywhere - TSH 0.545, FT4 0.98.  pharmacy verified with daughter is actively taking methimazole , will restart  Hypokalemia, resolved - trend on BMET.  Mag 2.1.  replete prn  Protein calorie malnutrition BMI 14.87 kg/m2 - EN per RD consult, not going to be extubated today but remains npo for EBUS later today  GOC > Pt lives w/daughter Luke.  Updated by phone 1/8 on imaging findings.  Reports functional decline, voice changes, change in cough (spots of blood pt attributed to strong cough), and significant unintentional wt loss all progressive over the last year, all in which pt attributed to her thyroid  goiter.  Pt has put off seeking further medical evaluation despite family concerns.  Will consult PMT for ongoing GOC.  Code status was addressed with concerns with her age, malnutrition, and new metastatic disease, that pt would poorly tolerate/ recover from CPR.  Pt to remains full code for now, pending further workup/ prognosis.  Daughter to bring in her living will but verbalizes she knows pt would not want life prolonging measures.    Best Practice (right  click and Reselect all SmartList Selections daily)   Diet/type: NPO DVT prophylaxis prophylactic heparin   Pressure ulcer(s): N/A GI prophylaxis: H2B Lines: N/A Foley:  Yes, and it is still needed Code Status:  full code Last date of multidisciplinary goals of care discussion [1/8}  Labs   CBC: Recent Labs  Lab 03/31/23 1227 03/31/23 2212 03/31/23 2309  WBC 6.1 7.5  --   NEUTROABS 2.4 6.2  --   HGB 12.6 12.4 12.6  HCT 39.1 39.5 37.0  MCV 71.1* 73.8*  --   PLT 426* 324  --     Basic Metabolic Panel: Recent Labs  Lab 03/31/23 1227 03/31/23 2212 03/31/23 2309 04/01/23 0552  NA 138  139 138 138  K 3.2* 3.9 3.6 3.8  CL 101 105  --  106  CO2 22 18*  --  17*  GLUCOSE 149* 212*  --  160*  BUN 12 10  --  14  CREATININE 0.66 0.71  --  0.92  CALCIUM  9.7 9.0  --  9.2  MG 1.8  --   --  2.1  PHOS  --  3.8  --  3.2   GFR: Estimated Creatinine Clearance: 32.3 mL/min (by C-G formula based on SCr of 0.92 mg/dL). Recent Labs  Lab 03/31/23 1227 03/31/23 2212  PROCALCITON  --  <0.10  WBC 6.1 7.5    Liver Function Tests: Recent Labs  Lab 03/31/23 1227 03/31/23 2212  AST 17 17  ALT 9 9  ALKPHOS 125 104  BILITOT 0.8 0.9  PROT 7.9 6.7  ALBUMIN 4.4 3.6   No results for input(s): LIPASE, AMYLASE in the last 168 hours. No results for input(s): AMMONIA in the last 168 hours.  ABG    Component Value Date/Time   PHART 7.378 03/31/2023 2309   PCO2ART 31.0 (L) 03/31/2023 2309   PO2ART 134 (H) 03/31/2023 2309   HCO3 18.4 (L) 03/31/2023 2309   TCO2 19 (L) 03/31/2023 2309   ACIDBASEDEF 6.0 (H) 03/31/2023 2309   O2SAT 99 03/31/2023 2309     Coagulation Profile: Recent Labs  Lab 03/31/23 1227  INR 1.2    Cardiac Enzymes: Recent Labs  Lab 03/31/23 1227  CKTOTAL 51    HbA1C: Hemoglobin A1C  Date/Time Value Ref Range Status  06/17/2022 12:00 AM 6.9  Final  05/08/2020 12:00 AM 5.7  Final   Hgb A1c MFr Bld  Date/Time Value Ref Range Status  05/27/2022 09:53 AM 6.9 (H) 4.8 - 5.6 % Final    Comment:             Prediabetes: 5.7 - 6.4          Diabetes: >6.4          Glycemic control for adults with diabetes: <7.0   12/05/2021 11:44 AM 6.7 (H) 4.8 - 5.6 % Final    Comment:             Prediabetes: 5.7 - 6.4          Diabetes: >6.4          Glycemic control for adults with diabetes: <7.0     CBG: Recent Labs  Lab 03/31/23 1204 03/31/23 2135 03/31/23 2348 04/01/23 0341 04/01/23 0744  GLUCAP 138* 209* 187* 171* 136*   Allergies Allergies  Allergen Reactions   Lipitor [Atorvastatin ] Other (See Comments)    Myalgias   Pravachol   [Pravastatin  Sodium] Nausea Only     Home Medications  Prior to Admission medications   Medication Sig  Start Date End Date Taking? Authorizing Provider  ACCU-CHEK AVIVA PLUS test strip 1 each daily. 06/24/19   [provider]  Accu-Chek Softclix Lancets lancets  06/24/19   [provider]  acetaminophen  (TYLENOL ) 500 MG tablet Take 500 mg by mouth every 6 (six) hours as needed (pain).    [provider]  dorzolamide-timolol (COSOPT) 2-0.5 % ophthalmic solution Place 1 drop into both eyes 2 (two) times daily. 02/18/23   [provider]  ezetimibe  (ZETIA ) 10 MG tablet Take 1 tablet (10 mg total) by mouth daily. 11/27/22   Joshua Cathryne BROCKS, MD  ferrous sulfate  325 (65 FE) MG tablet Take 1 tablet (325 mg total) by mouth daily with breakfast. 05/27/22   Joshua Cathryne BROCKS, MD  latanoprost  (XALATAN ) 0.005 % ophthalmic solution Place 1 drop into both eyes at bedtime.    [provider]  lisinopril  (ZESTRIL ) 40 MG tablet TAKE 1 TABLET BY MOUTH DAILY 02/28/23   Jones, Deanna C, MD  melatonin 3 MG TABS tablet Take 1 tablet (3 mg total) by mouth at bedtime. 02/07/21   Love, Sharlet RAMAN, PA-C  metFORMIN  (GLUCOPHAGE ) 1000 MG tablet Take 1 tablet (1,000 mg total) by mouth 2 (two) times daily with a meal. 05/27/22   Joshua Cathryne BROCKS, MD  methimazole  (TAPAZOLE ) 5 MG tablet Take 0.5 tablets (2.5 mg total) by mouth in the morning. 12/09/21   Joshua Cathryne BROCKS, MD  metoprolol  succinate (TOPROL -XL) 50 MG 24 hr tablet TAKE 1 TABLET BY MOUTH DAILY  WITH OR IMMEDIATELY FOLLOWING A  MEAL 12/26/22   Joshua Cathryne BROCKS, MD  Multiple Vitamin (MULTIVITAMIN WITH MINERALS) TABS tablet Take 1 tablet by mouth daily.    [provider]  pioglitazone  (ACTOS ) 15 MG tablet TAKE 1 TABLET BY MOUTH DAILY 10/10/22   Joshua Cathryne BROCKS, MD  rosuvastatin  (CRESTOR ) 5 MG tablet TAKE 1 TABLET BY MOUTH ONCE A  WEEK 03/16/23   Joshua Cathryne BROCKS, MD  traZODone  (DESYREL ) 50 MG tablet Take 0.5 tablets (25 mg total) by  mouth at bedtime. 11/27/22   Joshua Cathryne BROCKS, MD     Critical care time: 50 min     Lyle Pesa, MSN, AG-ACNP-BC [facility] 04/01/2023, 8:15 AM  See Amion for pager If no response to pager , please call [phone] until 7pm After 7:00 pm call Elink  [phone]

## 2023-04-01 NOTE — Anesthesia Preprocedure Evaluation (Addendum)
 Anesthesia Evaluation  Patient identified by MRN, date of birth, ID band Patient unresponsive    Reviewed: Allergy & Precautions, H&P , NPO status , Patient's Chart, lab work & pertinent test results  Airway Mallampati: Intubated       Dental no notable dental hx. (+) Teeth Intact, Dental Advisory Given   Pulmonary former smoker Intubated and sedated   Pulmonary exam normal breath sounds clear to auscultation       Cardiovascular Exercise Tolerance: Good hypertension, Pt. on medications and Pt. on home beta blockers  Rhythm:Regular Rate:Normal     Neuro/Psych Seizures -, Poorly Controlled,    Depression       GI/Hepatic Neg liver ROS,GERD  ,,  Endo/Other  diabetes, Type 2, Oral Hypoglycemic Agents    Renal/GU negative Renal ROS  negative genitourinary   Musculoskeletal   Abdominal   Peds  Hematology negative hematology ROS (+)   Anesthesia Other Findings   Reproductive/Obstetrics negative OB ROS                             Anesthesia Physical Anesthesia Plan  ASA: 4  Anesthesia Plan: General   Post-op Pain Management: Minimal or no pain anticipated   Induction: Intravenous  PONV Risk Score and Plan: 3 and Ondansetron  and Treatment may vary due to age or medical condition  Airway Management Planned: Oral ETT  Additional Equipment: None  Intra-op Plan:   Post-operative Plan: Post-operative intubation/ventilation  Informed Consent: I have reviewed the patients History and Physical, chart, labs and discussed the procedure including the risks, benefits and alternatives for the proposed anesthesia with the patient or authorized representative who has indicated his/her understanding and acceptance.     Dental advisory given  Plan Discussed with: Anesthesiologist and CRNA  Anesthesia Plan Comments: (  Pt admitted with acute seizure and stroke like symptoms. Pt was  subsequently intubated for airway protection and admitted to [facility] for monitoring and ltm after MRI.  Acute hypoxic resp failure 2/2 acute encephalopathy 2/2 sz activity LUL infiltrate, pneumonia with suspected aspiration with seizure New intracranial lesion noted on cth R temporal lobe, with history of meningioma.  )        Anesthesia Quick Evaluation

## 2023-04-01 NOTE — Interval H&P Note (Signed)
 No changes to plan, proceed with EBUS guided biopsy of lung mass; consent obtained from daughter over phone.

## 2023-04-01 NOTE — Progress Notes (Signed)
 Pt daughter Ann Chaney here today presenting official POA documentation.

## 2023-04-02 ENCOUNTER — Inpatient Hospital Stay (HOSPITAL_COMMUNITY): Payer: Medicare Other

## 2023-04-02 DIAGNOSIS — C7931 Secondary malignant neoplasm of brain: Secondary | ICD-10-CM | POA: Diagnosis not present

## 2023-04-02 DIAGNOSIS — J9601 Acute respiratory failure with hypoxia: Secondary | ICD-10-CM

## 2023-04-02 DIAGNOSIS — G40901 Epilepsy, unspecified, not intractable, with status epilepticus: Secondary | ICD-10-CM | POA: Diagnosis not present

## 2023-04-02 DIAGNOSIS — C799 Secondary malignant neoplasm of unspecified site: Secondary | ICD-10-CM

## 2023-04-02 LAB — BASIC METABOLIC PANEL
Anion gap: 13 (ref 5–15)
BUN: 18 mg/dL (ref 8–23)
CO2: 21 mmol/L — ABNORMAL LOW (ref 22–32)
Calcium: 9.2 mg/dL (ref 8.9–10.3)
Chloride: 108 mmol/L (ref 98–111)
Creatinine, Ser: 1.01 mg/dL — ABNORMAL HIGH (ref 0.44–1.00)
GFR, Estimated: 58 mL/min — ABNORMAL LOW (ref >60)
Glucose, Bld: 158 mg/dL — ABNORMAL HIGH (ref 70–99)
Potassium: 3.5 mmol/L (ref 3.5–5.1)
Sodium: 142 mmol/L (ref 135–145)

## 2023-04-02 LAB — PHOSPHORUS
Phosphorus: 3.4 mg/dL (ref 2.5–4.6)
Phosphorus: 1.8 mg/dL — ABNORMAL LOW (ref 2.5–4.6)

## 2023-04-02 LAB — GLUCOSE, CAPILLARY
Glucose-Capillary: 132 mg/dL — ABNORMAL HIGH (ref 70–99)
Glucose-Capillary: 143 mg/dL — ABNORMAL HIGH (ref 70–99)
Glucose-Capillary: 155 mg/dL — ABNORMAL HIGH (ref 70–99)
Glucose-Capillary: 200 mg/dL — ABNORMAL HIGH (ref 70–99)
Glucose-Capillary: 205 mg/dL — ABNORMAL HIGH (ref 70–99)
Glucose-Capillary: 237 mg/dL — ABNORMAL HIGH (ref 70–99)

## 2023-04-02 LAB — CBC
HCT: 34.7 % — ABNORMAL LOW (ref 36.0–46.0)
Hemoglobin: 11 g/dL — ABNORMAL LOW (ref 12.0–15.0)
MCH: 23.1 pg — ABNORMAL LOW (ref 26.0–34.0)
MCHC: 31.7 g/dL (ref 30.0–36.0)
MCV: 72.7 fL — ABNORMAL LOW (ref 80.0–100.0)
Platelets: 305 10*3/uL (ref 150–400)
RBC: 4.77 MIL/uL (ref 3.87–5.11)
RDW: 15.4 % (ref 11.5–15.5)
WBC: 15.6 10*3/uL — ABNORMAL HIGH (ref 4.0–10.5)
nRBC: 0 % (ref 0.0–0.2)

## 2023-04-02 LAB — HEMOGLOBIN A1C
Hgb A1c MFr Bld: 5.6 % (ref 4.8–5.6)
Mean Plasma Glucose: 114 mg/dL

## 2023-04-02 LAB — MAGNESIUM
Magnesium: 2 mg/dL (ref 1.7–2.4)
Magnesium: 2.4 mg/dL (ref 1.7–2.4)

## 2023-04-02 MED ORDER — OXYCODONE HCL 5 MG PO TABS
5.0000 mg | ORAL_TABLET | Freq: Four times a day (QID) | ORAL | Status: DC | PRN
  Administered 2023-04-02 – 2023-04-03 (×2): 5 mg
  Filled 2023-04-02 (×2): qty 1

## 2023-04-02 MED ORDER — LACTATED RINGERS IV BOLUS
500.0000 mL | Freq: Once | INTRAVENOUS | Status: AC
  Administered 2023-04-02: 500 mL via INTRAVENOUS

## 2023-04-02 MED ORDER — DEXAMETHASONE SODIUM PHOSPHATE 4 MG/ML IJ SOLN
4.0000 mg | Freq: Four times a day (QID) | INTRAMUSCULAR | Status: DC
  Administered 2023-04-03 – 2023-04-06 (×4): 4 mg via INTRAVENOUS
  Filled 2023-04-02 (×5): qty 1

## 2023-04-02 MED ORDER — POTASSIUM CHLORIDE 20 MEQ PO PACK
40.0000 meq | PACK | Freq: Once | ORAL | Status: AC
  Administered 2023-04-02: 40 meq
  Filled 2023-04-02: qty 2

## 2023-04-02 MED ORDER — METOPROLOL TARTRATE 5 MG/5ML IV SOLN
2.5000 mg | INTRAVENOUS | Status: DC | PRN
  Administered 2023-04-02 – 2023-04-03 (×4): 5 mg via INTRAVENOUS
  Filled 2023-04-02 (×4): qty 5

## 2023-04-02 MED ORDER — FENTANYL CITRATE PF 50 MCG/ML IJ SOSY
50.0000 ug | PREFILLED_SYRINGE | Freq: Once | INTRAMUSCULAR | Status: AC
  Administered 2023-04-02: 50 ug via INTRAVENOUS

## 2023-04-02 MED ORDER — SODIUM CHLORIDE 0.9 % IV SOLN
1.5000 g | Freq: Two times a day (BID) | INTRAVENOUS | Status: DC
  Filled 2023-04-02: qty 4

## 2023-04-02 MED ORDER — FAMOTIDINE 20 MG PO TABS
20.0000 mg | ORAL_TABLET | Freq: Every day | ORAL | Status: DC
  Administered 2023-04-03 – 2023-04-05 (×3): 20 mg
  Filled 2023-04-02 (×3): qty 1

## 2023-04-02 MED ORDER — SODIUM CHLORIDE 0.9 % IV SOLN
1.5000 g | Freq: Two times a day (BID) | INTRAVENOUS | Status: DC
  Administered 2023-04-02 – 2023-04-03 (×2): 1.5 g via INTRAVENOUS
  Filled 2023-04-02 (×3): qty 4

## 2023-04-02 MED ORDER — MAGNESIUM SULFATE 2 GM/50ML IV SOLN
2.0000 g | Freq: Once | INTRAVENOUS | Status: AC
  Administered 2023-04-02: 2 g via INTRAVENOUS
  Filled 2023-04-02: qty 50

## 2023-04-02 MED ORDER — DEXAMETHASONE 4 MG PO TABS
4.0000 mg | ORAL_TABLET | Freq: Four times a day (QID) | ORAL | Status: DC
  Administered 2023-04-02 – 2023-04-05 (×11): 4 mg
  Filled 2023-04-02 (×9): qty 1

## 2023-04-02 MED ORDER — METOPROLOL TARTRATE 5 MG/5ML IV SOLN
5.0000 mg | Freq: Once | INTRAVENOUS | Status: DC
  Filled 2023-04-02: qty 5

## 2023-04-02 MED ORDER — DEXAMETHASONE 4 MG PO TABS
4.0000 mg | ORAL_TABLET | Freq: Four times a day (QID) | ORAL | Status: DC
  Filled 2023-04-02 (×2): qty 1

## 2023-04-02 NOTE — Progress Notes (Signed)
 eLink Physician-Brief Progress Note Patient Name: Ann Chaney DOB: 02/28/1946 MRN: [id number]   Date of Service  04/02/2023  HPI/Events of Note  Current analgesics seem to cause profound hypotension  eICU Interventions  Renew restraints Add oxycodone  as needed for longer acting effect     Intervention Category Intermediate Interventions: Pain - evaluation and management  Allayah Raineri 04/02/2023, 7:52 PM

## 2023-04-02 NOTE — Progress Notes (Signed)
 Pharmacy Antibiotic Note  Ann Chaney is a 78 y.o. female admitted on 03/31/2023 with aspiration pneumonia.  Pharmacy has been consulted for Unasyn  dosing.  WBC 15.6, afebrile, Scr bumb to 1.01 today.   Plan: Unasyn  adjusted to 1.5g IV every 12 hours for 5 days  Monitor renal function, cultures, and overall clinical picture  Height: 5' 4 (162.6 cm) Weight: 39.4 kg (86 lb 13.8 oz) IBW/kg (Calculated) : 54.7  Temp (24hrs), Avg:97.8 F (36.6 C), Min:96.9 F (36.1 C), Max:98.4 F (36.9 C)  Recent Labs  Lab 03/31/23 1227 03/31/23 2212 04/01/23 0552 04/01/23 0901 04/01/23 1723 04/02/23 0459  WBC 6.1 7.5  --  11.6*  --  15.6*  CREATININE 0.66 0.71 0.92  --  0.77 1.01*    Estimated Creatinine Clearance: 29.5 mL/min (A) (by C-G formula based on SCr of 1.01 mg/dL (H)).    Allergies  Allergen Reactions   Lipitor [Atorvastatin ] Other (See Comments)    Myalgias   Pravachol  [Pravastatin  Sodium] Nausea Only    Antimicrobials this admission: Unasyn  1/7 >> (1/12)   Dose adjustments this admission:  Microbiology results: 1/7 MRSA PCR: neg 1/7 Bcx: ngtd  Thank you for involving pharmacy in the patient's care.   Hubert Ruths, PharmD PGY1 Acute Care Pharmacy Resident  04/02/2023 11:58 AM

## 2023-04-02 NOTE — Consult Note (Signed)
 [facility] CONSULT NOTE  Patient Care Team: Joshua Cathryne BROCKS, MD as PCP - General (Family Medicine)  CHIEF COMPLAINTS/PURPOSE OF CONSULTATION:  Lung mets, brain mass  REFERRING PHYSICIAN: Dr. Harold  HISTORY OF PRESENTING ILLNESS:  Ann Chaney 78 y.o. female who was brought to the ED on 03/31/2023 with suspected stroke.  Patient's daughter stated that patient was in her usual state of health, however woke up that morning and seemed a little off.  Shaking-like activity and foaming of the mouth was noticed by family and she was taken to [facility] ED with their own car. Upon arrival in the ED patient was noted to have left-sided weakness and left facial droop.  Indeed code stroke was activated.  Workup was done in the Ed including a CT of head.  During the CT process, patient began to have seizure-like activity.  CT scan demonstrated a newly seen 2.5 cm abnormality in the right superior temporal lobe which looks like a mass lesion.  Patient was intubated and transferred to the ICU at [facility] for further treatment and management. Therefore oncology consult has been requested.  Patient is now seen and assessed in the ICU.  She is currently intubated and sedated.  Medical history obtained from patient's chart.  Past medical history significant for diabetes, glaucoma, hyperlipidemia, and hypertension. Surgical history significant for left meningioma resection in October 2022. Social history significant for being a current smoker.  Apparently patient lives with her daughter Luke who stated that she did notice a functional decline in the patient with voice changes, spots of blood that patient attributed to coughing, significant unintentional weight loss over the last year which again patient attributed to her thyroid .  No medical care was sought after for those concerns per the patient's daughter.    I have reviewed her chart and materials related to her cancer extensively and collaborated  history with the patient. Summary of oncologic history is as follows: Oncology History   No history exists.    ASSESSMENT & PLAN:  1.  Right temporal brain mass, likely lung mets with bone mets - CT scan of head done 03/31/2023 demonstrated a newly seen 2.5 cm and abnormality in the right superior temporal lobe which looks like a mass lesion.   MRI of head done 04/01/2023 shows findings consistent with intracranial metastatic disease with largest lesion measuring 3.2 cm at the right temporal lobe. - CT of chest abdomen pelvis was done on 04/01/2023 shows large necrotic appearing mass in the left AP window/hilum adjacent to central LUL concerning for malignancy.  There are multiple bilateral pulmonary nodules also compatible for mets. - CT also showed mottled appearance of left sacrum, left iliac bone with areas of lytic bone destruction compatible with mets. - Patient is currently intubated due to acute respiratory failure. - Bronchoscopy with biopsy done on 04/01/2023.  The path is pending at this time. - Treatment recommendations and management will be forthcoming when pathology resulted. - Consider palliative consult for goals of care discussion with family. - Medical oncology/Dr. Tina will be following closely.    2.  Acute respiratory failure with hypoxia LUL Pneumonia - Currently intubated, noted plan to extubate today - On IV antibiotics, continue as ordered - Management per critical care team  3. Diabetes/hypertension/hyperlipidemia/hypothyroidism - Management per medicine    Orders Placed This Encounter  Procedures   Procedural/ Surgical Case Request: VIDEO BRONCHOSCOPY WITH ENDOBRONCHIAL ULTRASOUND    Standing Status:   Standing    Number of  Occurrences:   1    Pre-op diagnosis:   lung mass   MRSA Next Gen by PCR, Nasal    Standing Status:   Standing    Number of Occurrences:   1   Culture, Respiratory w Gram Stain    Standing Status:   Standing    Number of Occurrences:   1    Culture, blood (Routine X 2) w Reflex to ID Panel    Standing Status:   Standing    Number of Occurrences:   2   MR BRAIN W WO CONTRAST    Standing Status:   Standing    Number of Occurrences:   1    If indicated for the ordered procedure, I authorize the administration of contrast media per Radiology protocol:   Yes    What is the patient's sedation requirement?:   No Sedation    Does the patient have a pacemaker or implanted devices?:   No   CT CHEST ABDOMEN PELVIS W CONTRAST    Standing Status:   Standing    Number of Occurrences:   1    Does the patient have a contrast media/X-ray dye allergy?:   No    If indicated for the ordered procedure, I authorize the administration of oral contrast media per Radiology protocol:   Yes   DG Abd Portable 1V    Standing Status:   Standing    Number of Occurrences:   1    Symptom/Reason for Exam:   Encounter for imaging study to confirm orogastric (OG) tube placement [[id number]]    Symptom/Reason for Exam:   Endotracheal tube present [[id number]]   DG Chest Port 1 View    Standing Status:   Standing    Number of Occurrences:   1    Symptom/Reason for Exam:   Encounter for imaging study to confirm orogastric (OG) tube placement [[id number]]    Symptom/Reason for Exam:   Endotracheal tube present [[id number]]   Hemoglobin A1c    To assess prior glycemic control    Standing Status:   Standing    Number of Occurrences:   1   Comprehensive metabolic panel    Standing Status:   Standing    Number of Occurrences:   1   Phosphorus    Standing Status:   Standing    Number of Occurrences:   1   Procalcitonin    Standing Status:   Standing    Number of Occurrences:   1   CBC with Differential/Platelet    Standing Status:   Standing    Number of Occurrences:   1   Basic metabolic panel    Standing Status:   Standing    Number of Occurrences:   1   Magnesium     Standing Status:   Standing    Number of Occurrences:   1   Phosphorus    Standing Status:    Standing    Number of Occurrences:   1   Triglycerides    While on propofol  (DIPRIVAN )    Standing Status:   Standing    Number of Occurrences:   6   TSH    Standing Status:   Standing    Number of Occurrences:   1   T4, free    Standing Status:   Standing    Number of Occurrences:   1   Phenytoin  level, total    Standing Status:   Standing    Number of  Occurrences:   1   Urinalysis, Routine w reflex microscopic -Urine, Clean Catch    Standing Status:   Standing    Number of Occurrences:   1    Specimen Source:   Urine, Clean Catch [76]   Rapid urine drug screen (hospital performed)    Standing Status:   Standing    Number of Occurrences:   1   Glucose, capillary    Standing Status:   Standing    Number of Occurrences:   1   Glucose, capillary    Standing Status:   Standing    Number of Occurrences:   1   Glucose, capillary    Standing Status:   Standing    Number of Occurrences:   1   Triglycerides    Standing Status:   Standing    Number of Occurrences:   1   CBC    Standing Status:   Standing    Number of Occurrences:   1   Glucose, capillary    Standing Status:   Standing    Number of Occurrences:   1   Procalcitonin    Standing Status:   Standing    Number of Occurrences:   1    Specimen collection method:   Lab=Lab collect   CBC    Standing Status:   Standing    Number of Occurrences:   3    Specimen collection method:   Lab=Lab collect   Phosphorus    Standing Status:   Standing    Number of Occurrences:   4    Specimen collection method:   Lab=Lab collect   Magnesium     Standing Status:   Standing    Number of Occurrences:   4    Specimen collection method:   Lab=Lab collect   Glucose, capillary    Standing Status:   Standing    Number of Occurrences:   1   Basic metabolic panel    Standing Status:   Standing    Number of Occurrences:   1   Glucose, capillary    Standing Status:   Standing    Number of Occurrences:   1   Glucose, capillary     Standing Status:   Standing    Number of Occurrences:   1   Glucose, capillary    Standing Status:   Standing    Number of Occurrences:   1   Glucose, capillary    Standing Status:   Standing    Number of Occurrences:   1   Basic metabolic panel    Standing Status:   Standing    Number of Occurrences:   1    Specimen collection method:   Lab=Lab collect   Glucose, capillary    Standing Status:   Standing    Number of Occurrences:   1   Diet NPO time specified    Standing Status:   Standing    Number of Occurrences:   1   Refer to Sidebar Report - CHG cloths Sidebar    - CHG cloths Sidebar    Standing Status:   Standing    Number of Occurrences:   1   Patient Education: - [facility] Daily CHG Bathing    Standing Status:   Standing    Number of Occurrences:   1    Specify:   - [facility] Daily CHG Bathing   Complete oral care assessment tool on admission, transfer, and q shift  Standing Status:   Standing    Number of Occurrences:   1   Refer to Sidebar Report Adult Oral Care Protocol    Adult Oral Care Protocol    Standing Status:   Standing    Number of Occurrences:   1   Apply Diabetes Mellitus Care Plan    Standing Status:   Standing    Number of Occurrences:   1   STAT CBG when hypoglycemia is suspected. If treated, recheck every 15 minutes after each treatment until CBG >/= 70 mg/dl    Standing Status:   Standing    Number of Occurrences:   1   Refer to Hypoglycemia Protocol Sidebar Report for treatment of CBG < 70 mg/dl    Standing Status:   Standing    Number of Occurrences:   1   Vital signs    Standing Status:   Standing    Number of Occurrences:   1   Cardiac monitoring    Standing Status:   Standing    Number of Occurrences:   1   Initiate eLink PCCM Adult ICU Electrolyte Replacement Protocol    Standing Status:   Standing    Number of Occurrences:   1   Weigh on admission and daily    Standing Status:   Standing    Number of Occurrences:   1   Strict intake and  output    Standing Status:   Standing    Number of Occurrences:   1   Initiate Oral Care Protocol    Standing Status:   Standing    Number of Occurrences:   1   Initiate Carrier Fluid Protocol    Standing Status:   Standing    Number of Occurrences:   1   Refer to Sidebar Report: Pain and Agitation Protocol for Mechanically Ventilated Patients    Standing Status:   Standing    Number of Occurrences:   1   RASS Assessment    Standing Status:   Standing    Number of Occurrences:   1   Pain Assessment    Standing Status:   Standing    Number of Occurrences:   1   Monitor [place] Agitation Sedation (RAS) Score    Standing Status:   Standing    Number of Occurrences:   1    Desired RASS score::   0, -1   Baseline QTc    Standing Status:   Standing    Number of Occurrences:   1   QTc monitoring    Standing Status:   Standing    Number of Occurrences:   [phone]   Desired CPOT goal < / = 2    Standing Status:   Standing    Number of Occurrences:   1   Perform daily wake-up assessment protocol (WUA) unless contraindicated or otherwise ordered by physician    Standing Status:   Standing    Number of Occurrences:   1   Intensive Care Delirium Screening Checklist (ICDSC)    Standing Status:   Standing    Number of Occurrences:   1   Care order/instruction: If no CBG ordered, please ask the provider to order ICU Glycemic Control order set    If no CBG ordered, please ask the provider to order ICU Glycemic Control order set    Standing Status:   Standing    Number of Occurrences:   1   Elevate head of bed greater  than 30 degrees unless contraindicated    Standing Status:   Standing    Number of Occurrences:   1   Suction - laryngeal    Intermittent suctioning.  No greater than -120 mm/Hg.    Standing Status:   Standing    Number of Occurrences:   1   Suction Oropharanx    Standing Status:   Standing    Number of Occurrences:   1   Suction Oropharynx    Standing Status:   Standing     Number of Occurrences:   99999   Endotracheal tube suction    Standing Status:   Standing    Number of Occurrences:   20   Gastric tube    Standing Status:   Standing    Number of Occurrences:   1    Gastric tube insertion route:   Oral    Gastric tube to:   Low intermittent suction   Use Y-connector to maintain separate oral and ETT/Trach suctioning    Standing Status:   Standing    Number of Occurrences:   1   Change oral suction equipment (yankauer, tubing, canister lining and y-connector)    Standing Status:   Standing    Number of Occurrences:   1   Continue foley catheter    Standing Status:   Standing    Number of Occurrences:   1    Reason to continue foley:   patient critically ill   Care order/instruction: Once EEG is started, start precedex  and wean prop by 10mcg/kg every hour until off.    Once EEG is started, start precedex  and wean prop by 10mcg/kg every hour until off.    Standing Status:   Standing    Number of Occurrences:   1   Complete oral care assessment tool on admission, transfer, and q shift    Standing Status:   Standing    Number of Occurrences:   1   Refer to Sidebar Report Adult Oral Care Protocol    Adult Oral Care Protocol    Standing Status:   Standing    Number of Occurrences:   1   Brush teeth with suction toothbrush 4 times daily    Standing Status:   Standing    Number of Occurrences:   1   Informed Consent Details: Physician/Practitioner Attestation; Transcribe to consent form and obtain patient signature    Standing Status:   Standing    Number of Occurrences:   1    Physician/Practitioner attestation of informed consent for procedure/surgical case:   I, the physician/practitioner, attest that I have discussed with the patient the benefits, risks, side effects, alternatives, likelihood of achieving goals and potential problems during recovery for the procedure that I have provided informed consent.    Procedure:   Bronchoscopy with EBUS  guided biopsy    Physician/Practitioner performing the procedure:   Dr. Claudene    Indication/Reason:   lung mass   Notify physician (specify)    Standing Status:   Standing    Number of Occurrences:   1    Notify Physician:   abdominal pain    Notify Physician:   abdominal distention    Notify Physician:   vomiting    Notify Physician:   suspected aspiration    Notify Physician:   if unable to maintain [facility] greater than 30 degrees   Refer to Sidebar Report for Adult Tube Feeding Protocol    for Adult Tube Feeding  Protocol    Standing Status:   Standing    Number of Occurrences:   1   Daily weights    Standing Status:   Standing    Number of Occurrences:   1   Tube maintenance    Flush tube with 30 ml (20 ml if fluid restricted) sterile water every 4 hours, before and after medication administration, and when continuous feeding is interrupted.    Standing Status:   Standing    Number of Occurrences:   1   Initiate Unclogging Feeding Tube Protocol    Standing Status:   Standing    Number of Occurrences:   1   feeding tube Use existing    Use existing    Standing Status:   Standing    Number of Occurrences:   1   Full code    Standing Status:   Standing    Number of Occurrences:   1    By::   Consent: discussion documented in EHR   CCM pharmacy monitoring    Standing Status:   Standing    Number of Occurrences:   1    Comment:   Pharmacy may adjust antibiotics for renal function and replace electrolytes per Elink protocol.   ampicillin -sulbactam (UNASYN ) per pharmacy consult    Standing Status:   Standing    Number of Occurrences:   1    Antibiotic Indication::   Aspiration Pneumonia   Consult to palliative care    Standing Status:   Standing    Number of Occurrences:   1    Palliative Care Consult Services:   Palliative Medicine Consult    Reason for Consult?:   metastatic cancer, new, workup underway.  GOC   Consult to Registered Dietitian    For EBUS today, can start TF  afterwards.    Standing Status:   Standing    Number of Occurrences:   1    Reason for consult?:   Assessment of nutrition requirements/status    Reason for consult?:   Enteral/tube feeding initiation and management per Adult Tube Feeding and Unclogging Feeding Tube Protocols   Pulse oximetry, continuous    Standing Status:   Standing    Number of Occurrences:   1   Adult mechanical ventilation    Standing Status:   Standing    Number of Occurrences:   1    Mode:   PRVC    Rate::   16    Tidal volume:   8 ml/kg IBW    Adjust to keep O2 sats:   > 95%    Weaning approach or protocol:   Daily Respiratory Wean Assessment Protocol    Initiate oral care protocol:   Yes    Perform daily Wake Up Assessment protocol (WUA):   Yes   I-STAT 7, (LYTES, BLD GAS, ICA, H+H)    Standing Status:   Standing    Number of Occurrences:   1   EKG 12-Lead    Standing Status:   Standing    Number of Occurrences:   1   Overnight EEG with video    Going to MRI right now, so regular leads are fine. Should come back in ~60 minutes and be ready for hookup then    Standing Status:   Standing    Number of Occurrences:   1    Reason for exam:   Altered mental status   Discontinue Long Term Monitor EEG    Standing Status:  Standing    Number of Occurrences:   1   Admit to Inpatient (patient's expected length of stay will be greater than 2 midnights or inpatient only procedure)    Standing Status:   Standing    Number of Occurrences:   1    Hospital Area:   [facility] [[id number]]    Level of Care:   ICU [6]    Covid Evaluation:   Asymptomatic - no recent exposure (last 10 days) testing not required    Diagnosis:   Status epilepticus [facility]) [822669]    Admitting Physician:   HAROLD SCHOLZ [[id number]]    Attending Physician:   HAROLD SCHOLZ [[id number]]    Certification::   I certify this patient will need inpatient services for at least 2 midnights    Expected Medical Readiness:   04/14/2023    Restraints non-violent    Standing Status:   Standing    Number of Occurrences:   1    Restraint type:   Soft restraint    Soft restraint type:   bilateral wrist    Reason for restraints:   Interference with medical treatment     MEDICAL HISTORY:  Past Medical History:  Diagnosis Date   Diabetes mellitus without complication (HCC)    Glaucoma    Glaucoma    Hyperlipidemia    Hypertension     SURGICAL HISTORY: Past Surgical History:  Procedure Laterality Date   ABDOMINAL HYSTERECTOMY     CRANIOTOMY Left 01/14/2021   Procedure: Craniotomy - left - Frontal - Tumor;  Surgeon: Louis Shove, MD;  Location: [facility] OR;  Service: Neurosurgery;  Laterality: Left;   HYSTEROTOMY      SOCIAL HISTORY: Social History   Socioeconomic History   Marital status: Divorced    Spouse name: Not on file   Number of children: 1   Years of education: Not on file   Highest education level: Associate degree: academic program  Occupational History   Occupation: retired  Tobacco Use   Smoking status: Former    Current packs/day: 0.00    Types: Cigarettes    Quit date: 07/31/2020    Years since quitting: 2.6   Smokeless tobacco: Never  Vaping Use   Vaping status: Never Used  Substance and Sexual Activity   Alcohol  use: No   Drug use: No   Sexual activity: Not Currently  Other Topics Concern   Not on file  Social History Narrative   Not on file   Social Drivers of Health   Financial Resource Strain: Low Risk  (08/20/2022)   Overall Financial Resource Strain (CARDIA)    Difficulty of Paying Living Expenses: Not hard at all  Food Insecurity: Patient Unable To Answer (04/01/2023)   Hunger Vital Sign    Worried About Running Out of Food in the Last Year: Patient unable to answer    Ran Out of Food in the Last Year: Patient unable to answer  Transportation Needs: Patient Unable To Answer (04/01/2023)   PRAPARE - Transportation    Lack of Transportation (Medical): Patient unable to answer    Lack  of Transportation (Non-Medical): Patient unable to answer  Physical Activity: Insufficiently Active (08/20/2022)   Exercise Vital Sign    Days of Exercise per Week: 2 days    Minutes of Exercise per Session: 20 min  Stress: No Stress Concern Present (08/20/2022)   [employer] of Occupational Health - Occupational Stress Questionnaire    Feeling of Stress : Not at  all  Social Connections: Patient Unable To Answer (04/01/2023)   Social Connection and Isolation Panel [NHANES]    Frequency of Communication with Friends and Family: Patient unable to answer    Frequency of Social Gatherings with Friends and Family: Patient unable to answer    Attends Religious Services: Patient unable to answer    Active Member of Clubs or Organizations: Patient unable to answer    Attends Banker Meetings: Patient unable to answer    Marital Status: Patient unable to answer  Intimate Partner Violence: Patient Unable To Answer (04/01/2023)   Humiliation, Afraid, Rape, and Kick questionnaire    Fear of Current or Ex-Partner: Patient unable to answer    Emotionally Abused: Patient unable to answer    Physically Abused: Patient unable to answer    Sexually Abused: Patient unable to answer    FAMILY HISTORY: Family History  Problem Relation Age of Onset   Cancer Mother        sternum   Emphysema Mother    Diabetes Mother    Glaucoma Father    Dementia Father    Breast cancer Maternal Aunt     REVIEW OF SYSTEMS:  Unable. Pt is intubated and sedated.  PHYSICAL EXAMINATION: ECOG PERFORMANCE STATUS: 4 - Bedbound  Vitals:   04/02/23 0600 04/02/23 0800  BP: (!) 109/58   Pulse: 85   Resp: 16   Temp:  98 F (36.7 C)  SpO2: 100%    Filed Weights   04/01/23 0600 04/01/23 1453 04/02/23 0417  Weight: 86 lb 10.3 oz (39.3 kg) 93 lb 7.6 oz (42.4 kg) 86 lb 13.8 oz (39.4 kg)    GENERAL: +intubated +sedated +ill appearing SKIN: +pale skin color EYES: normal, conjunctiva are pink and  non-injected, sclera clear OROPHARYNX: +intubated   NECK: supple, thyroid  normal size   LYMPH: no palpable lymphadenopathy in the cervical, axillary or inguinal LUNGS: +intubated  HEART: regular rate & rhythm and no murmurs and no lower extremity edema ABDOMEN: abdomen soft, and normal bowel sounds MUSCULOSKELETAL: no cyanosis of digits and no clubbing  PSYCH: +intubated    ALLERGIES:  is allergic to lipitor [atorvastatin ] and pravachol  [pravastatin  sodium].  MEDICATIONS:  Current Facility-Administered Medications  Medication Dose Route Frequency Provider Last Rate Last Admin   ampicillin -sulbactam (UNASYN ) 1.5 g in sodium chloride  0.9 % 100 mL IVPB  1.5 g Intravenous Q6H Billy Rocky SAUNDERS, RPH   Stopped at 04/02/23 0604   Chlorhexidine  Gluconate Cloth 2 % PADS 6 each  6 each Topical Daily Chand, Sudham, MD   6 each at 04/01/23 2118   dexamethasone  (DECADRON ) tablet 4 mg  4 mg Per Tube Q6H Chand, Sudham, MD   4 mg at 04/02/23 0532   Or   dexamethasone  (DECADRON ) tablet 4 mg  4 mg Oral Q6H Chand, Sudham, MD       Or   dexamethasone  (DECADRON ) injection 4 mg  4 mg Intravenous Q6H Harold Scholz, MD       dexmedetomidine  (PRECEDEX ) 400 MCG/100ML (4 mcg/mL) infusion  0-1.2 mcg/kg/hr Intravenous Titrated Tanda Powell ORN, RPH 3.93 mL/hr at 04/02/23 0627 0.4 mcg/kg/hr at 04/02/23 9372   docusate (COLACE) 50 MG/5ML liquid 100 mg  100 mg Per Tube BID PRN Rosan Deward ORN, NP       docusate (COLACE) 50 MG/5ML liquid 100 mg  100 mg Per Tube BID Rosan Deward ORN, NP   100 mg at 04/01/23 2118   famotidine  (PEPCID ) tablet 20 mg  20 mg  Per Tube BID Rosan Deward ORN, NP   20 mg at 04/01/23 2118   feeding supplement (OSMOLITE 1.2 CAL) liquid 1,000 mL  1,000 mL Per Tube Continuous Antonetta Moccasin B, NP 30 mL/hr at 04/02/23 0741 Rate Change at 04/02/23 0741   fentaNYL  (SUBLIMAZE ) injection 25 mcg  25 mcg Intravenous Q15 min PRN Rosan Deward ORN, NP       fentaNYL  (SUBLIMAZE ) injection 25-100 mcg  25-100 mcg  Intravenous Q30 min PRN Rosan Deward ORN, NP   50 mcg at 04/01/23 2258   heparin  injection 5,000 Units  5,000 Units Subcutaneous Q12H Antonetta Moccasin B, NP   5,000 Units at 04/02/23 0532   insulin  aspart (novoLOG ) injection 0-15 Units  0-15 Units Subcutaneous Q4H Rosan Deward ORN, NP   3 Units at 04/02/23 0736   ipratropium-albuterol  (DUONEB) 0.5-2.5 (3) MG/3ML nebulizer solution 3 mL  3 mL Nebulization Q6H Rosan Deward ORN, NP   3 mL at 04/02/23 9162   ipratropium-albuterol  (DUONEB) 0.5-2.5 (3) MG/3ML nebulizer solution 3 mL  3 mL Nebulization Q6H PRN Rosan Deward ORN, NP       levETIRAcetam  (KEPPRA ) IVPB 500 mg/100 mL premix  500 mg Intravenous Q12H Voncile Isles, MD   Stopped at 04/01/23 2022   LORazepam  (ATIVAN ) injection 2 mg  2 mg Intravenous Q4H PRN Bhagat, Srishti L, MD       methIMAzole  (TAPAZOLE ) tablet 2.5 mg  2.5 mg Per Tube Daily Antonetta Moccasin B, NP   2.5 mg at 04/01/23 1130   Oral care mouth rinse  15 mL Mouth Rinse Q2H Harold Scholz, MD   15 mL at 04/02/23 0736   Oral care mouth rinse  15 mL Mouth Rinse PRN Harold Scholz, MD       polyethylene glycol (MIRALAX  / GLYCOLAX ) packet 17 g  17 g Per Tube Daily PRN Rosan Deward ORN, NP       polyethylene glycol (MIRALAX  / GLYCOLAX ) packet 17 g  17 g Per Tube Daily Rosan Deward ORN, NP   17 g at 04/01/23 9043   propofol  (DIPRIVAN ) 1000 MG/100ML infusion  0-50 mcg/kg/min Intravenous Continuous Rosan Deward ORN, NP   Stopped at 04/01/23 0031   thiamine  (VITAMIN B1) tablet 100 mg  100 mg Per Tube Daily Antonetta Moccasin B, NP         LABORATORY DATA:  I have reviewed the data as listed Lab Results  Component Value Date   WBC 15.6 (H) 04/02/2023   HGB 11.0 (L) 04/02/2023   HCT 34.7 (L) 04/02/2023   MCV 72.7 (L) 04/02/2023   PLT 305 04/02/2023   Recent Labs    05/27/22 0953 06/17/22 0000 03/31/23 1227 03/31/23 2212 03/31/23 2309 04/01/23 0552 04/01/23 1723  NA 143   < > 138 139 138 138 141  K 4.4   < > 3.2* 3.9 3.6 3.8 5.0  CL 108*  --   101 105  --  106 110  CO2 22  --  22 18*  --  17* 18*  GLUCOSE 152*   < > 149* 212*  --  160* 137*  BUN 12   < > 12 10  --  14 15  CREATININE 0.80   < > 0.66 0.71  --  0.92 0.77  CALCIUM  9.7  --  9.7 9.0  --  9.2 8.7*  GFRNONAA  --    < > >60 >60  --  >60 >60  PROT  --   --  7.9 6.7  --   --   --  ALBUMIN 4.3  --  4.4 3.6  --   --   --   AST  --   --  17 17  --   --   --   ALT  --   --  9 9  --   --   --   ALKPHOS  --   --  125 104  --   --   --   BILITOT  --   --  0.8 0.9  --   --   --    < > = values in this interval not displayed.    RADIOGRAPHIC STUDIES: I have personally reviewed the radiological images as listed and agreed with the findings in the report. DG Chest Port 1 View Result Date: 04/01/2023 CLINICAL DATA:  Check gastric catheter placement EXAM: PORTABLE CHEST 1 VIEW COMPARISON:  03/31/2023 FINDINGS: Endotracheal tube and gastric catheter are noted in satisfactory position. Stable airspace opacity in the left apex is noted. Lungs are otherwise clear. No bony abnormality is noted. IMPRESSION: Tubes and lines as described above. Stable opacity in the left upper lobe. Electronically Signed   By: Oneil Devonshire M.D.   On: 04/01/2023 18:57   DG Abd Portable 1V Result Date: 04/01/2023 CLINICAL DATA:  Orogastric tube EXAM: PORTABLE ABDOMEN - 1 VIEW COMPARISON:  Abdominal x-ray 03/31/2023 FINDINGS: Enteric tube tip projects over the mid body of the stomach, unchanged. IMPRESSION: Enteric tube tip projects over the mid body of the stomach, unchanged. Electronically Signed   By: Greig Pique M.D.   On: 04/01/2023 18:57   Overnight EEG with video Result Date: 04/01/2023 Shel[place] Arlin KIDD, MD     04/01/2023  3:32 PM Patient Name: CHEROKEE BOCCIO MRN: [id number] Epilepsy Attending: Arlin KIDD Shelton Referring Physician/Provider: Jerrie Lola CROME, MD Duration: 1/7/20525 2350 to 04/01/2023 1135 Patient history: 77yo F. She was last seen normal at 8 AM and subsequently found to be not speaking  properly at 9 AM. Approximately 30 minutes later began to phone at the mouth with shaking and left facial droop with left upper and lower extremity weakness, seen as a code stroke by Dr. Merrianne. She had further witnessed seizure activity on the left side and the CT scanner with maintained awareness and ability to follow some simple right sided motor commands. EEG to evaluate for seizure Level of alertness: comatose/ lethargic AEDs during EEG study: LEV, Propofol  Technical aspects: This EEG study was done with scalp electrodes positioned according to the 10-20 International system of electrode placement. Electrical activity was reviewed with band pass filter of 1-70Hz , sensitivity of 7 uV/mm, display speed of 73mm/sec with a 60Hz  notched filter applied as appropriate. EEG data were recorded continuously and digitally stored.  Video monitoring was available and reviewed as appropriate. Description: EEG showed continuous generalized and maximal right temporal region polymorphic sharply contoured 3 to 7 Hz theta-delta slowing.  Hyperventilation and photic stimulation were not performed.   ABNORMALITY - Continuous slow, generalized and maximal right temporal region IMPRESSION: This study is suggestive of cortical dysfunction in right temporal region likely secondary to underlying structural abnormality/mass.  Additionally there is moderate to severe diffuse encephalopathy.  No seizures or epileptiform discharges were seen throughout the recording. Arlin KIDD Shel[place]   MR BRAIN W WO CONTRAST Addendum Date: 04/01/2023 ADDENDUM REPORT: 04/01/2023 05:59 ADDENDUM: In addition to the initially described findings. Abnormal soft tissue density is seen within the partially visualized anterior neck (series 9, images 16, 7), likely reflecting diffuse thyroidal enlargement.  Correlation with physical exam recommended. Additionally, dedicated CT of the neck with contrast and/or thyroid  ultrasound could be performed for further  evaluation as warranted. Electronically Signed   By: Morene Hoard M.D.   On: 04/01/2023 05:59   Result Date: 04/01/2023 CLINICAL DATA:  Initial evaluation for brain/CNS neoplasm. EXAM: MRI HEAD WITHOUT AND WITH CONTRAST TECHNIQUE: Multiplanar, multiecho pulse sequences of the brain and surrounding structures were obtained without and with intravenous contrast. CONTRAST:  5.5mL GADAVIST  GADOBUTROL  1 MMOL/ML IV SOLN COMPARISON:  CT from earlier the same day as well as previous MRI from 01/15/2021. FINDINGS: Brain: Generalized age-related cerebral atrophy. Patchy and confluent T2/FLAIR hyperintensity involving the periventricular and deep white matter both cerebral hemispheres, consistent with advanced chronic microvascular ischemic disease. Prior left frontal craniotomy for tumor resection. Postoperative encephalomalacia and gliosis within the underlying left frontal lobe. Chronic blood products present within this region. No evidence for locally recurrent tumor. No evidence for acute or subacute ischemia. No acute intracranial hemorrhage. Multiple scattered rim enhancing lesions are seen involving the bilateral cerebral hemispheres as well as the cerebellum, consistent with metastatic disease. The largest of these lesions is positioned within the right temporal lobe in measures 3.2 x 2.7 x 2.8 cm (series 16, image 27). Surrounding vasogenic edema seen within the adjacent right temporal lobe. The largest lesion within the contralateral left cerebral hemisphere seen at the left frontal operculum and measures 9 mm (series 16, image 37). Few subcentimeter lesion seen within the cerebellum (series 16, images 14, 11). Overall, there appear to be less than 10 lesions at this time. No other abnormal enhancement. Ex vacuo dilatation of the left lateral ventricle without hydrocephalus. No extra-axial fluid collection. Pituitary gland suprasellar region within normal limits. Vascular: Irregular flow void within the V4  segments and proximal basilar artery, which could be related to slow flow and/or occlusion. Suspected fetal type origin of the PCAs bilaterally. Major intracranial vascular flow voids are otherwise maintained. Skull and upper cervical spine: Craniocervical junction within normal limits. Bone marrow signal intensity normal. No acute scalp soft tissue abnormality. Sinuses/Orbits: Globes orbital soft tissues within normal limits. Paranasal sinuses are largely clear. Small bilateral mastoid effusions. Patient appears to be intubated. Other: None. IMPRESSION: 1. Findings consistent with intracranial metastatic disease, with the largest lesion measuring up to 3.2 cm at the right temporal lobe. 2. Prior left frontal craniotomy for tumor resection. No evidence for locally recurrent tumor at this location. 3. Irregular flow void within the V4 segments and proximal basilar artery, which could be related to slow flow and/or occlusion. 4. Underlying age-related cerebral atrophy with advanced chronic microvascular ischemic disease. Electronically Signed: By: Morene Hoard M.D. On: 04/01/2023 04:40   CT CHEST ABDOMEN PELVIS W CONTRAST Result Date: 04/01/2023 CLINICAL DATA:  Brain lesion.  Evaluate for primary EXAM: CT CHEST, ABDOMEN, AND PELVIS WITH CONTRAST TECHNIQUE: Multidetector CT imaging of the chest, abdomen and pelvis was performed following the standard protocol during bolus administration of intravenous contrast. RADIATION DOSE REDUCTION: This exam was performed according to the departmental dose-optimization program which includes automated exposure control, adjustment of the mA and/or kV according to patient size and/or use of iterative reconstruction technique. CONTRAST:  75mL OMNIPAQUE  IOHEXOL  350 MG/ML SOLN COMPARISON:  None Available. FINDINGS: CT CHEST FINDINGS Cardiovascular: Heart is normal size. Aorta is normal caliber. Scattered coronary artery calcifications. Moderate aortic atherosclerosis.  Mediastinum/Nodes: Necrotic appearing mass in the AP window measuring 4.7 x 4.3 cm. It is difficult to determine if this originates in  the left upper lobe or mediastinum/hilum. No additional enlarged mediastinal, hilar or axillary lymph nodes. Trachea and esophagus unremarkable. Endotracheal tube tip is in the midtrachea. Markedly enlarged, abnormal thyroid  with numerous bilateral large nodules and masses. Findings most compatible with multinodular goiter. This has been evaluated on previous imaging. (ref: J Am Coll Radiol. 2015 Feb;12(2): 143-50). Lungs/Pleura: Airspace disease in the left upper lobe, likely postobstructive pneumonia. Bilateral pulmonary nodules. Left apical nodule measures 8 mm on image 25. Right upper lobe nodule measures 9 mm on image 32. Posterior pleural base nodule in the superior segment of the left lower lobe measures 12 mm on image 55. Right upper lobe nodule measures 7 mm on image 44. Dependent and bibasilar atelectasis. No effusions. Musculoskeletal: Chest wall soft tissues are unremarkable. No acute bony abnormality. CT ABDOMEN PELVIS FINDINGS Hepatobiliary: No suspicious focal hepatic abnormality or biliary ductal dilatation. There appears to be gallbladder wall thickening or pericholecystic fluid. No visible stones. Pancreas: No focal abnormality or ductal dilatation. Spleen: Area of low-density throughout the spleen could reflect splenic hypoperfusion or infarct. Normal size. Adrenals/Urinary Tract: Adrenal glands normal. No suspicious renal abnormality. Small cyst in the midpole of the right kidney measuring 12 mm. No follow-up imaging recommended. No stones or hydronephrosis. Foley catheter within the bladder which is decompressed. Stomach/Bowel: Stomach, large and small bowel grossly unremarkable. NG tube in the stomach which is decompressed. Vascular/Lymphatic: Diffuse aortoiliac atherosclerosis. No aneurysm or adenopathy. Reproductive: Prior hysterectomy.  No adnexal masses.  Other: No free fluid or free air. Musculoskeletal: Lytic destructive process noted in the left side of the sacrum and left iliac bone. Mottled appearance of the left sacrum, left iliac bone and possibly right ischium and inferior pubic ramus. IMPRESSION: Large necrotic appearing mass seen in the left AP window/hilum and possibly adjacent central left upper lobe concerning for malignancy. It is difficult to determine if this is arising from the central left upper lobe or mediastinum/hilum. Obstructive process in the left upper lobe, likely obstructive left upper lobe pneumonia. Multiple bilateral pulmonary nodules most compatible with metastases. Wedge-shaped low-density within the spleen may reflect splenic infarct. Mottled appearance of the left side of the sacrum, left iliac bone with areas of lytic bone destruction compatible with metastases. Mottled/sclerotic appearance within the right ischium may reflect metastasis as well. Gallbladder wall wall thickening versus pericholecystic fluid. No visible stones or biliary ductal dilatation. Aortic atherosclerosis. Electronically Signed   By: Franky Crease M.D.   On: 04/01/2023 00:34   EEG adult Result Date: 03/31/2023 Shelton Arlin KIDD, MD     03/31/2023  2:36 PM Patient Name: MALIN SAMBRANO MRN: [id number] Epilepsy Attending: Arlin KIDD Shelton Referring Physician/Provider: Merrianne Locus, MD Date: 03/31/2023 Duration: 29.24 mins Patient history: 78yo F with seizure like activity getting eeg to evaluate for seizure Level of alertness: comatose AEDs during EEG study: LEV, PHT,Propofol , Ativan  Technical aspects: This EEG study was done with scalp electrodes positioned according to the 10-20 International system of electrode placement. Electrical activity was reviewed with band pass filter of 1-70Hz , sensitivity of 7 uV/mm, display speed of 67mm/sec with a 60Hz  notched filter applied as appropriate. EEG data were recorded continuously and digitally stored.  Video monitoring  was available and reviewed as appropriate. Description: EEG showed continuous generalized and lateralized right hemisphere 3 to 6 Hz theta -delta slowing with overriding 13 to 15 Hz beta activity. Hyperventilation and photic stimulation were not performed.   ABNORMALITY - Continuous slow, generalized and lateralized right hemisphere IMPRESSION: This study  is suggestive of cortical dysfunction in right hemisphere likely secondary to underlying structural abnormality, postictal state.  Additionally there is severe diffuse encephalopathy, likely related to sedation.  No seizures or epileptiform discharges were seen throughout the recording. Arlin MALVA Krebs   DG Chest Portable 1 View Result Date: 03/31/2023 CLINICAL DATA:  Post intubation EXAM: PORTABLE CHEST 1 VIEW.  Portable supine view of the abdomen COMPARISON:  None Available. FINDINGS: ET tube in place with tip seen proximally 3 cm above the carina. Enteric tube with the tip extending beneath the diaphragm. Tip overlying the midbody of the stomach. Overlapping cardiac leads. Normal cardiopericardial silhouette. No pneumothorax, effusion or edema. Focal opacity left upper lobe. Possible infiltrate. Recommend follow-up. There is gas seen in nondilated loops of small and large bowel along the visualized abdomen. The caudal aspect of the pelvis is not included in the imaging field. No obstruction. No obvious free air on this portable supine radiograph. Osteopenia and degenerative changes. IMPRESSION: ET tube and enteric tube in place. Enteric tube has tip overlying the midbody of the stomach. Focal opacity left upper lobe. Possible infiltrate. Please correlate for any known history or follow up is recommended confirm resolution. Nonspecific bowel gas pattern. Electronically Signed   By: Ranell Bring M.D.   On: 03/31/2023 13:59   DG Abdomen 1 View Result Date: 03/31/2023 CLINICAL DATA:  Post intubation EXAM: PORTABLE CHEST 1 VIEW.  Portable supine view of the  abdomen COMPARISON:  None Available. FINDINGS: ET tube in place with tip seen proximally 3 cm above the carina. Enteric tube with the tip extending beneath the diaphragm. Tip overlying the midbody of the stomach. Overlapping cardiac leads. Normal cardiopericardial silhouette. No pneumothorax, effusion or edema. Focal opacity left upper lobe. Possible infiltrate. Recommend follow-up. There is gas seen in nondilated loops of small and large bowel along the visualized abdomen. The caudal aspect of the pelvis is not included in the imaging field. No obstruction. No obvious free air on this portable supine radiograph. Osteopenia and degenerative changes. IMPRESSION: ET tube and enteric tube in place. Enteric tube has tip overlying the midbody of the stomach. Focal opacity left upper lobe. Possible infiltrate. Please correlate for any known history or follow up is recommended confirm resolution. Nonspecific bowel gas pattern. Electronically Signed   By: Ranell Bring M.D.   On: 03/31/2023 13:59   CT HEAD CODE STROKE WO CONTRAST Result Date: 03/31/2023 CLINICAL DATA:  Code stroke. Neuro deficit, acute, stroke suspected. Confusion. Left-sided facial droop. Left-sided weakness. EXAM: CT HEAD WITHOUT CONTRAST TECHNIQUE: Contiguous axial images were obtained from the base of the skull through the vertex without intravenous contrast. RADIATION DOSE REDUCTION: This exam was performed according to the departmental dose-optimization program which includes automated exposure control, adjustment of the mA and/or kV according to patient size and/or use of iterative reconstruction technique. COMPARISON:  04/12/2021 FINDINGS: Brain: No focal abnormality affects the brainstem or cerebellum. Cerebral hemispheres show old infarctions of the left frontal cortical and subcortical brain and of the deep white matter extensively. There is a newly seen around it 2.5 cm abnormality in the right superior temporal lobe the looks like a mass  lesion. There is associated regional vasogenic edema. Chronic small-vessel ischemic changes otherwise within the brain appear essentially stable. Slight further ex vacuo enlargement of the frontal horns of the lateral ventricles relate to progressive volume loss in the frontal lobes. Obstructive hydrocephalus not suspected. No evidence of hemorrhage or extra-axial collection. Vascular: There is atherosclerotic calcification of the  major vessels at the base of the brain. Skull: Old left frontal craniotomy. Sinuses/Orbits: Clear/normal Other: None ASPECTS ([place] Stroke Program Early CT Score) - Ganglionic level infarction (caudate, lentiform nuclei, internal capsule, insula, M1-M3 cortex): 7, allowing for the chronic findings. - Supraganglionic infarction (M4-M6 cortex): 3, allowing for the chronic findings. Total score (0-10 with 10 being normal): 10, allowing for the chronic findings. IMPRESSION: 1. Newly seen 2.5 cm abnormality in the right superior temporal lobe with associated regional vasogenic edema. This looks like a mass lesion. MRI without and with contrast recommended. 2. 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 Chronic small-vessel ischemic changes otherwise within the brain appear essentially stable. 3. Slight further ex vacuo enlargement of the frontal horns of the lateral ventricles relate to progressive volume loss in the frontal lobes. Obstructive hydrocephalus not suspected. 4. Aspects is 10, allowing for the chronic findings. 5. These results were communicated to Dr. Lindzen at 12:21 pm on 03/31/2023 by text page via the [facility] messaging system. Electronically Signed   By: Oneil Officer M.D.   On: 03/31/2023 12:22     The total time spent in the appointment was 55 minutes encounter with patients including review of chart and various tests results, discussions about plan of care and coordination of care plan   All questions were  answered. The patient knows to call the clinic with any problems, questions or concerns. No barriers to learning was detected.  Olam JINNY Brunner, NP 1/9/20258:54 AM

## 2023-04-02 NOTE — Plan of Care (Signed)
  Problem: Clinical Measurements: Goal: Ability to maintain clinical measurements within normal limits will improve Outcome: Progressing Goal: Respiratory complications will improve Outcome: Progressing Goal: Cardiovascular complication will be avoided Outcome: Progressing   Problem: Nutrition: Goal: Adequate nutrition will be maintained Outcome: Progressing   Problem: Coping: Goal: Level of anxiety will decrease Outcome: Progressing   Problem: Pain Management: Goal: General experience of comfort will improve Outcome: Progressing   Problem: Safety: Goal: Ability to remain free from injury will improve Outcome: Progressing   Problem: Education: Goal: Knowledge of General Education information will improve Description: Including pain rating scale, medication(s)/side effects and non-pharmacologic comfort measures Outcome: Not Progressing

## 2023-04-02 NOTE — Progress Notes (Signed)
 NEUROLOGY CONSULT FOLLOW UP NOTE   Date of service: April 02, 2023 Patient Name: Ann Chaney MRN:  [id number] DOB:  [birth year]  Brief HPI  Ann Chaney is a 78 y.o. female with past medical history significant for DM, hyperlipidemia, hypertension, current smoker, left meningioma resection October 2022 who presented to [facility] and complex partial status and was transferred to [facility] 1/7 for continuous EEG.   Family stated patient was found to be not speaking properly at 9 AM.  Patient then had acute onset of shaking with left facial droop and left upper and left lower extremity weakness and she was seen as a code stroke by Dr. Parthenia telemetry consult at [facility].   CT head revealed a new 2.5 cm right superior temporal lobe lesion, concerning for mass with edema.  Patient has no prior seizure history.  She was given Keppra  load of 3 g total, given Decadron  10 mg, fosphenytoin  loading dose and was given Ativan  x 3 doses.  She was intubated and transported to [facility].     Interval Hx/subjective   - MRI brain with and without contrast shows intracranial metastatic disease with largest lesion measuring up to 3.2 cm at the right temporal lobe.  Evidence of prior left frontal craniotomy for tumor resection seen. - LTM EEG negative - CT chest abdomen pelvis with contrast shows a large necrotic mass in the left lung.  Patient is intubated and sedated, 04 dose of Precedex  she is on full support on the ventilator, 40%/5/16/440, failed SBT this morning.    Lung biopsy was completed at bedside yesterday.  On exam, she does progress with slight withdrawal to noxious stimuli.  She does not open eyes or follow commands. Unable to do full exam due to sedation.   Vitals   Vitals:   04/02/23 0417 04/02/23 0500 04/02/23 0600 04/02/23 0800  BP: 131/71 (!) 107/59 (!) 109/58   Pulse: 79 93 85   Resp: 16 14 16    Temp:    98 F (36.7 C)  TempSrc:    Axillary  SpO2: 100% 100% 100%   Weight: 39.4 kg      Height:         Body mass index is 14.91 kg/m.  Physical Exam   Constitutional: Critically ill female Eyes: No scleral injection.  HENT: No OP obstrucion. OG/ ET tube present Head: Normocephalic.  Cardiovascular: Normal rate and regular rhythm.  Respiratory: Mechanically ventilated, 40%/5/16/440 GI: Soft.  No distension. There is no tenderness.  Skin: WDI.   Neurologic Examination   Patient is intubated and sedated on 0.4 of Precedex .  She does not open eyes to voice or pain.  She does not follow commands.  She grimaces and does have mild withdrawal to all extremities.  Medications  Current Facility-Administered Medications:    ampicillin -sulbactam (UNASYN ) 1.5 g in sodium chloride  0.9 % 100 mL IVPB, 1.5 g, Intravenous, Q6H, Billy Rocky SAUNDERS, RPH, Stopped at 04/02/23 0604   Chlorhexidine  Gluconate Cloth 2 % PADS 6 each, 6 each, Topical, Daily, Harold Scholz, MD, 6 each at 04/01/23 2118   dexamethasone  (DECADRON ) tablet 4 mg, 4 mg, Per Tube, Q6H, 4 mg at 04/02/23 0532 **OR** dexamethasone  (DECADRON ) tablet 4 mg, 4 mg, Oral, Q6H **OR** dexamethasone  (DECADRON ) injection 4 mg, 4 mg, Intravenous, Q6H, Chand, Sudham, MD   dexmedetomidine  (PRECEDEX ) 400 MCG/100ML (4 mcg/mL) infusion, 0-1.2 mcg/kg/hr, Intravenous, Titrated, Tanda Powell ORN, [facility], Last Rate: 3.93 mL/hr at 04/02/23 0627, 0.4 mcg/kg/hr at 04/02/23 [phone]  docusate (COLACE) 50 MG/5ML liquid 100 mg, 100 mg, Per Tube, BID PRN, Rosan Deward ORN, NP   docusate (COLACE) 50 MG/5ML liquid 100 mg, 100 mg, Per Tube, BID, Rosan Deward ORN, NP, 100 mg at 04/02/23 0919   famotidine  (PEPCID ) tablet 20 mg, 20 mg, Per Tube, BID, Rosan Deward ORN, NP, 20 mg at 04/02/23 0920   feeding supplement (OSMOLITE 1.2 CAL) liquid 1,000 mL, 1,000 mL, Per Tube, Continuous, Antonetta Moccasin B, NP, Last Rate: 30 mL/hr at 04/02/23 0741, Rate Change at 04/02/23 0741   fentaNYL  (SUBLIMAZE ) injection 25 mcg, 25 mcg, Intravenous, Q15 min PRN, Rosan Deward ORN, NP    fentaNYL  (SUBLIMAZE ) injection 25-100 mcg, 25-100 mcg, Intravenous, Q30 min PRN, Rosan Deward ORN, NP, 50 mcg at 04/01/23 2258   heparin  injection 5,000 Units, 5,000 Units, Subcutaneous, Q12H, Antonetta Moccasin B, NP, 5,000 Units at 04/02/23 0532   insulin  aspart (novoLOG ) injection 0-15 Units, 0-15 Units, Subcutaneous, Q4H, Rosan Deward ORN, NP, 3 Units at 04/02/23 0736   ipratropium-albuterol  (DUONEB) 0.5-2.5 (3) MG/3ML nebulizer solution 3 mL, 3 mL, Nebulization, Q6H, Rosan Deward ORN, NP, 3 mL at 04/02/23 0837   ipratropium-albuterol  (DUONEB) 0.5-2.5 (3) MG/3ML nebulizer solution 3 mL, 3 mL, Nebulization, Q6H PRN, Rosan Deward ORN, NP   levETIRAcetam  (KEPPRA ) IVPB 500 mg/100 mL premix, 500 mg, Intravenous, Q12H, Darelle Kings, MD, Last Rate: 400 mL/hr at 04/02/23 0921, 500 mg at 04/02/23 9078   LORazepam  (ATIVAN ) injection 2 mg, 2 mg, Intravenous, Q4H PRN, Bhagat, Srishti L, MD   methIMAzole  (TAPAZOLE ) tablet 2.5 mg, 2.5 mg, Per Tube, Daily, Antonetta Moccasin B, NP, 2.5 mg at 04/02/23 0920   Oral care mouth rinse, 15 mL, Mouth Rinse, Q2H, Chand, Valinda, MD, 15 mL at 04/02/23 0920   Oral care mouth rinse, 15 mL, Mouth Rinse, PRN, Harold Valinda, MD   polyethylene glycol (MIRALAX  / GLYCOLAX ) packet 17 g, 17 g, Per Tube, Daily PRN, Rosan Deward ORN, NP   polyethylene glycol (MIRALAX  / GLYCOLAX ) packet 17 g, 17 g, Per Tube, Daily, Rosan Deward ORN, NP, 17 g at 04/02/23 0920   propofol  (DIPRIVAN ) 1000 MG/100ML infusion, 0-50 mcg/kg/min, Intravenous, Continuous, Rosan Deward ORN, NP, Stopped at 04/01/23 0031   thiamine  (VITAMIN B1) tablet 100 mg, 100 mg, Per Tube, Daily, Antonetta Moccasin B, NP, 100 mg at 04/02/23 0920 Labs and Diagnostic Imaging   CBC:  Recent Labs  Lab 03/31/23 1227 03/31/23 2212 03/31/23 2309 04/01/23 0901 04/02/23 0459  WBC 6.1 7.5  --  11.6* 15.6*  NEUTROABS 2.4 6.2  --   --   --   HGB 12.6 12.4   < > 11.7* 11.0*  HCT 39.1 39.5   < > 36.0 34.7*  MCV 71.1* 73.8*  --  71.4* 72.7*  PLT  426* 324  --  352 305   < > = values in this interval not displayed.    Basic Metabolic Panel:  Lab Results  Component Value Date   NA 142 04/02/2023   K 3.5 04/02/2023   CO2 21 (L) 04/02/2023   GLUCOSE 158 (H) 04/02/2023   BUN 18 04/02/2023   CREATININE 1.01 (H) 04/02/2023   CALCIUM  9.2 04/02/2023   GFRNONAA 58 (L) 04/02/2023   GFRAA 89 04/26/2019   Lipid Panel:  Lab Results  Component Value Date   LDLCALC 99 06/17/2022   HgbA1c:  Lab Results  Component Value Date   HGBA1C 5.6 03/31/2023   Urine Drug Screen:     Component Value Date/Time  LABOPIA NONE DETECTED 04/01/2023 0232   COCAINSCRNUR NONE DETECTED 04/01/2023 0232   LABBENZ NONE DETECTED 04/01/2023 0232   AMPHETMU NONE DETECTED 04/01/2023 0232   THCU NONE DETECTED 04/01/2023 0232   LABBARB NONE DETECTED 04/01/2023 0232    Alcohol  Level     Component Value Date/Time   ETH <10 03/31/2023 1227   INR  Lab Results  Component Value Date   INR 1.2 03/31/2023   APTT  Lab Results  Component Value Date   APTT 27 03/31/2023   AED levels:  Lab Results  Component Value Date   PHENYTOIN  22.3 (H) 03/31/2023    CT Head without contrast(Personally reviewed): -New 2.5 cm abnormality in the right superior temporal lobe with associated regional vasogenic edema 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 - Prior left frontal craniotomy for  tumor resection. - Underlying age-related cerebral atrophy advanced chronic small-vessel ischemic changes  cEEG 1/7: -Continuous slow, generalized moderate right hemisphere.  Study suggests subcortical dysfunction of right hemisphere likely secondary to underlying structural abnormality, postictal state.  Additionally severe diffuse encephalopathy, likely related to sedation.  No seizures or epileptiform discharges.  cEEG 1/8:  -Continuous slow, generalized and maximal right region.  Study suggestive of cortical dysfunction in the right temporal region likely secondary to underlying structural abnormality/mass.  Additionally there is moderate to severe diffuse encephalopathy.  No seizures or left form discharges were seen throughout the recording  Assessment   Ann Chaney is a 78 y.o. female with past medical history significant for DM, hyperlipidemia, hypertension, current smoker, left meningioma resection October 2022 who presented to [facility] and complex partial status and was transferred to [facility] 1/7 for continuous EEG.  CT chest shows necrotic lung mass concerning for primary lung disease, MRI shows brain mass with edema concerning for metastatic intracranial disease.   Patient placed on prophylactic AEDs and on Decadron  for edema.  LTM EEG has been negative so far and no seizure-like activity has been seen, discontinued 1/8.She is at high-risk for seizures due to her brain mass and subsequent edema.     Recommendations   - OK from neurology to wean and extubate - Wean Precedex  as tolerated.  - Continue 500mg  Keppra  BID prophylactic d/t high risk of seizures secondary to brain mass - Continue Decadron  4mg  Q6H due to edema seen on MRI  - Further treatment and outpatient plan per Oncology/Neuro-Oncology  Plan discussed with CCM attending Dr. Harold and Ronnald Gave, NP  Will follow along. ______________________________________________________________________   Pt seen by Neuro  NP/APP and later by MD. Note/plan to be edited by MD as needed.    Rocky JAYSON Likes, DNP, AGACNP-BC [facility] Please use AMION for contact information & EPIC for messaging.   Attending Neurohospitalist Addendum Patient seen and examined with APP/Resident. Agree with the history and physical as documented above. Agree with the plan as documented, which I helped formulate. I have independently reviewed the chart, obtained history, review of systems and examined the patient.I have personally reviewed pertinent head/neck/spine imaging (  CT/MRI). Please feel free to call with any questions.  -- Eligio Lav, MD Neurologist [facility] Pager: [phone]  CRITICAL CARE ATTESTATION Performed by: Eligio Lav, MD Total critical care time: 30 minutes Critical care time was exclusive of separately billable procedures and treating other patients and/or supervising APPs/Residents/Students Critical care was necessary to treat or prevent imminent or life-threatening deterioration. This patient is critically ill and at significant risk for neurological worsening and/or death and care requires constant monitoring. Critical care was time spent personally by me on the following activities: development of treatment plan with patient and/or surrogate as well as nursing, discussions with consultants, evaluation of patient's response to treatment, examination of patient, obtaining history from patient or surrogate, ordering and performing treatments and interventions, ordering and review of laboratory studies, ordering and review of radiographic studies, pulse oximetry, re-evaluation of patient's condition, participation in multidisciplinary rounds and medical decision making of high complexity in the care of this patient.

## 2023-04-02 NOTE — Progress Notes (Signed)
     Referral received for Erminio JULIANNA Ditch: goals of care discussion. Chart reviewed and updates received from RN. Patient assessed and is unable to engage appropriately in discussions.  I was able to speak with the patient's daughter Luke Pyo. We scheduled a meeting Friday 04/02/22 @ 1045. Family is aware we will meet at patient's bedside.   Detailed note and recommendations to follow once GOC has been completed.   Thank you for your referral and allowing PMT to assist in [facility] care.   Stephane Palin, NP Palliative Medicine Team  Team Phone # [phone]   NO CHARGE

## 2023-04-02 NOTE — Progress Notes (Signed)
 Pt's HR 130s-*140s, off Prec gtt since 1117. Now needing prn Fent 25 mcg. Gerald Leitz, NP informed.

## 2023-04-02 NOTE — Progress Notes (Signed)
   04/02/23 0832  Daily Weaning Assessment  Daily Assessment of Readiness to Wean Wean protocol criteria met (SBT performed)  Reason not met Apnea  Patient response Failed SBT terminated   Pt was placed on PS/CPAP but did not tolerate due to low Mve and low RR. Ventilator placed back on full support.

## 2023-04-02 NOTE — Progress Notes (Signed)
 Attempted WUA/SBT today, has been off cont sedation since approx 1100. Did require some PRNs for HR and analgesia.  In afternoon, had an abrupt change while on PSV with poor air movement, associated desat to 80s high peaks. Resumed PRVC. After troubleshooting/exam, felt best step would be to resume sedation. With fent bolus + precedex  resumed, improvement in volumes and sats. Duoneb also given.   Plan to cont sedation & PRVC tonight. Can revisit wean again in AM. Get CXR now, low suspicion ptx but with high peaks want to r/o    Ronnald Gave MSN, AGACNP-BC [facility] Pulmonary/Critical Care Medicine 04/02/2023, 4:08 PM

## 2023-04-02 NOTE — Progress Notes (Signed)
 Pt noted to have BP 80/58 MAP 64, O2 100 HR 85 at times desynchrony w/ vent. Dr. Merrily Pew informed. Now pt' BP 110/67 MAP 79

## 2023-04-02 NOTE — Progress Notes (Signed)
 eLink Physician-Brief Progress Note Patient Name: MEIYA WISLER DOB: May 10, 1945 MRN: [id number]   Date of Service  04/02/2023  HPI/Events of Note  diminished UOP this shift, around 30-11mL/hr. Also, BP soft. Currently 82/53 (64). +950cc for the day. No tube feeds  eICU Interventions  500 cc lr bolus     Intervention Category Intermediate Interventions: Oliguria - evaluation and management  Rethel Sebek 04/02/2023, 4:28 AM

## 2023-04-02 NOTE — TOC CM/SW Note (Signed)
 Transition of Care [facility]) - Inpatient Brief Assessment   Patient Details  Name: Ann Chaney MRN: [id number] Date of Birth: [birth year]  Transition of Care [facility]) CM/SW Contact:    Inocente GORMAN Kindle, LCSW Phone Number: 04/02/2023, 10:18 AM   Clinical Narrative: Patient admitted from home and is currently intubated. Patient's daughter is documented POA per RN. Please place consult if TOC needs arise.     Transition of Care Asessment: Insurance and Status: Insurance coverage has been reviewed Patient has primary care physician: Yes Home environment has been reviewed: From home Prior level of function:: Independent Prior/Current Home Services: No current home services Social Drivers of Health Review: SDOH reviewed no interventions necessary Readmission risk has been reviewed: Yes Transition of care needs: no transition of care needs at this time

## 2023-04-02 NOTE — Progress Notes (Signed)
 NAME:  Ann Chaney, MRN:  [id number], DOB:  1945-05-06, LOS: 2 ADMISSION DATE:  03/31/2023, CONSULTATION DATE:  1/7 REFERRING MD:  Dr. Willo EDP, CHIEF COMPLAINT:  Status epilepticus    History of Present Illness:  78 year old female with past medical history as below, which is significant for hypertension, diabetes, glaucoma, hyperthyroidism with goiter, meningioma causing seizure status post resection in 2022. She was in her usual state of health living with her daughter but quite independent on 1/7 when she developed alered mental status and facial droop with left sided weakness. She also had shaking activity and foaming at the mouth. Family brought her to [facility] ED by personal vehicle. Upon arrival to the ED a code stroke was initiated.  She was sent urgently for CT of the head and immediately following the scan the patient began to experience spasmodic, repetitive, and rhythmic left facial twitching and foaming at the mouth.  This resolved spontaneously after about 2 minutes.  She was given Ativan  and Keppra  load.  CT showed a new 2.5 cm abnormality in the right superior temporal lobe with surrounding vasogenic edema.  She was intubated ultimately for airway protection.  Spot EEG was negative for active seizure.  She was transferred to [facility] for ICU admission and continuous EEG monitoring.  Pertinent  Medical History   has a past medical history of Diabetes mellitus without complication (HCC), Glaucoma, Glaucoma, Hyperlipidemia, and Hypertension.   Significant Hospital Events: Including procedures, antibiotic start and stop dates in addition to other pertinent events   1/7 tx from [facility] ER for AMS, seizures, intubated  1/8 EBUS for lung mass  1/9 wean sedation. Onc to see    Interim History / Subjective:  EBUS yesterday. Still no further sz  NAEO White count to 15.6 this morning No BMP   Failed SBT, but was still on precedex    Objective   Blood pressure (!) 118/59, pulse 89,  temperature 98 F (36.7 C), temperature source Axillary, resp. rate 16, height 5' 4 (1.626 m), weight 39.4 kg, SpO2 100%.    Vent Mode: PRVC FiO2 (%):  [40 %] 40 % Set Rate:  [16 bmp] 16 bmp Vt Set:  [440 mL] 440 mL PEEP:  [5 cmH20] 5 cmH20 Plateau Pressure:  [13 cmH20-20 cmH20] 17 cmH20   Intake/Output Summary (Last 24 hours) at 04/02/2023 1044 Last data filed at 04/02/2023 1000 Gross per 24 hour  Intake 2165.22 ml  Output 490 ml  Net 1675.22 ml   Filed Weights   04/01/23 0600 04/01/23 1453 04/02/23 0417  Weight: 39.3 kg 42.4 kg 39.4 kg    Examination: General:  Acutely ill appearing older adult F intubated sedate  HEENT: NCAT pink mm ETT secure  Neuro: Sedated does not follow commands  CV: rrr  PULM:  Mechanically ventilated. Some left rhonchi  GI: soft ndnt  Extremities: no acute joint deformity  Skin: c/d/w    Resolved Hospital Problem list   Focal status epilepticus  Hypokalemia Assessment & Plan:   Acute encephalopathy  Focal SE resolved  P -appreciate neuro recs  -sz precautions, PRN BZD   R temporal lobe mass with vasogenic edema L hilar mass, numerous bilateral lung nodules  Suspected metastatic dz process  new brain mass, lung vs mediastinal/ hilar necrotic mass with bilateral and multiple pulmonary nodules and evidence of osseous destruction in left sacrum and left iliac, possible right ischium.   P - decadron  - oncology consulted   -awaiting EBUs results. Also had FNA  11/4, but cyto not available in care everywhere   Acute resp failure w hypoxia  LUL PNA  Hx tobacco use  P -WUA/SBT, hopefully extubate 1/9 -short course unasyn    DM  - CBG q 4, SSI prn w/ steroids  Hx HTN - holding home meds -- on soft side currently which is likely sedation related   Hyperthyroidism with goiter - S/p FNA 11/4, cytology not available in care everywhere, follows w [facility] P -tapazole    Severe protein calorie malnutrition BMI 14.87  - If remains intubated 1/9,  start EN. Otherwise will be RDN consult for assessment of needs   GOC From PCCM note 1/8:  Pt lives w/daughter Luke.  Updated by phone 1/8 on imaging findings.  Reports functional decline, voice changes, change in cough (spots of blood pt attributed to strong cough), and significant unintentional wt loss all progressive over the last year, all in which pt attributed to her thyroid  goiter.  Pt has put off seeking further medical evaluation despite family concerns.  Will consult PMT for ongoing GOC.  Code status was addressed with concerns with her age, malnutrition, and new metastatic disease, that pt would poorly tolerate/ recover from CPR.  Pt to remains full code for now, pending further workup/ prognosis.  Daughter to bring in her living will but verbalizes she knows pt would not want life prolonging measures.    Best Practice (right click and Reselect all SmartList Selections daily)   Diet/type: NPO DVT prophylaxis prophylactic heparin   Pressure ulcer(s): N/A GI prophylaxis: H2B Lines: N/A Foley:  Yes, and it is no longer needed and removal ordered  Code Status:  full code Last date of multidisciplinary goals of care discussion [1/8}  Labs   CBC: Recent Labs  Lab 03/31/23 1227 03/31/23 2212 03/31/23 2309 04/01/23 0901 04/02/23 0459  WBC 6.1 7.5  --  11.6* 15.6*  NEUTROABS 2.4 6.2  --   --   --   HGB 12.6 12.4 12.6 11.7* 11.0*  HCT 39.1 39.5 37.0 36.0 34.7*  MCV 71.1* 73.8*  --  71.4* 72.7*  PLT 426* 324  --  352 305    Basic Metabolic Panel: Recent Labs  Lab 03/31/23 1227 03/31/23 2212 03/31/23 2309 04/01/23 0552 04/01/23 1723 04/02/23 0459  NA 138 139 138 138 141 142  K 3.2* 3.9 3.6 3.8 5.0 3.5  CL 101 105  --  106 110 108  CO2 22 18*  --  17* 18* 21*  GLUCOSE 149* 212*  --  160* 137* 158*  BUN 12 10  --  14 15 18   CREATININE 0.66 0.71  --  0.92 0.77 1.01*  CALCIUM  9.7 9.0  --  9.2 8.7* 9.2  MG 1.8  --   --  2.1  --  2.0  PHOS  --  3.8  --  3.2  --  3.4    GFR: Estimated Creatinine Clearance: 29.5 mL/min (A) (by C-G formula based on SCr of 1.01 mg/dL (H)). Recent Labs  Lab 03/31/23 1227 03/31/23 2212 04/01/23 0901 04/02/23 0459  PROCALCITON  --  <0.10 <0.10  --   WBC 6.1 7.5 11.6* 15.6*    Liver Function Tests: Recent Labs  Lab 03/31/23 1227 03/31/23 2212  AST 17 17  ALT 9 9  ALKPHOS 125 104  BILITOT 0.8 0.9  PROT 7.9 6.7  ALBUMIN 4.4 3.6   No results for input(s): LIPASE, AMYLASE in the last 168 hours. No results for input(s): AMMONIA in the last 168  hours.  ABG    Component Value Date/Time   PHART 7.378 03/31/2023 2309   PCO2ART 31.0 (L) 03/31/2023 2309   PO2ART 134 (H) 03/31/2023 2309   HCO3 18.4 (L) 03/31/2023 2309   TCO2 19 (L) 03/31/2023 2309   ACIDBASEDEF 6.0 (H) 03/31/2023 2309   O2SAT 99 03/31/2023 2309     Coagulation Profile: Recent Labs  Lab 03/31/23 1227  INR 1.2    Cardiac Enzymes: Recent Labs  Lab 03/31/23 1227  CKTOTAL 51    HbA1C: Hemoglobin A1C  Date/Time Value Ref Range Status  06/17/2022 12:00 AM 6.9  Final  05/08/2020 12:00 AM 5.7  Final   Hgb A1c MFr Bld  Date/Time Value Ref Range Status  03/31/2023 10:12 PM 5.6 4.8 - 5.6 % Final    Comment:    (NOTE)         Prediabetes: 5.7 - 6.4         Diabetes: >6.4         Glycemic control for adults with diabetes: <7.0   05/27/2022 09:53 AM 6.9 (H) 4.8 - 5.6 % Final    Comment:             Prediabetes: 5.7 - 6.4          Diabetes: >6.4          Glycemic control for adults with diabetes: <7.0     CBG: Recent Labs  Lab 04/01/23 1643 04/01/23 1935 04/01/23 2357 04/02/23 0349 04/02/23 0725  GLUCAP 141* 107* 174* 132* 155*   Allergies Allergies  Allergen Reactions   Lipitor [place].deans ] Other (See Comments)    Myalgias   Pravachol  [Pravastatin  Sodium] Nausea Only     CRITICAL CARE Performed by: Ronnald FORBES Gave   Total critical care time: 38 minutes  Critical care time was exclusive of separately  billable procedures and treating other patients. Critical care was necessary to treat or prevent imminent or life-threatening deterioration.  Critical care was time spent personally by me on the following activities: development of treatment plan with patient and/or surrogate as well as nursing, discussions with consultants, evaluation of patient's response to treatment, examination of patient, obtaining history from patient or surrogate, ordering and performing treatments and interventions, ordering and review of laboratory studies, ordering and review of radiographic studies, pulse oximetry and re-evaluation of patient's condition.  Ronnald Gave MSN, AGACNP-BC [place] Pulmonary/Critical Care Medicine Amion for pager 04/02/2023, 10:44 AM

## 2023-04-02 NOTE — Plan of Care (Signed)
   Problem: Education: Goal: Knowledge of General Education information will improve Description: Including pain rating scale, medication(s)/side effects and non-pharmacologic comfort measures Outcome: Progressing   Problem: Clinical Measurements: Goal: Will remain free from infection Outcome: Progressing

## 2023-04-03 ENCOUNTER — Encounter (HOSPITAL_COMMUNITY): Payer: Self-pay | Admitting: Internal Medicine

## 2023-04-03 DIAGNOSIS — G40901 Epilepsy, unspecified, not intractable, with status epilepticus: Secondary | ICD-10-CM | POA: Diagnosis not present

## 2023-04-03 DIAGNOSIS — Z7189 Other specified counseling: Secondary | ICD-10-CM

## 2023-04-03 DIAGNOSIS — G935 Compression of brain: Secondary | ICD-10-CM

## 2023-04-03 DIAGNOSIS — Z515 Encounter for palliative care: Secondary | ICD-10-CM | POA: Diagnosis not present

## 2023-04-03 LAB — GLUCOSE, CAPILLARY
Glucose-Capillary: 104 mg/dL — ABNORMAL HIGH (ref 70–99)
Glucose-Capillary: 263 mg/dL — ABNORMAL HIGH (ref 70–99)
Glucose-Capillary: 148 mg/dL — ABNORMAL HIGH (ref 70–99)
Glucose-Capillary: 124 mg/dL — ABNORMAL HIGH (ref 70–99)
Glucose-Capillary: 166 mg/dL — ABNORMAL HIGH (ref 70–99)
Glucose-Capillary: 87 mg/dL (ref 70–99)

## 2023-04-03 LAB — CBC
HCT: 30.6 % — ABNORMAL LOW (ref 36.0–46.0)
Hemoglobin: 9.8 g/dL — ABNORMAL LOW (ref 12.0–15.0)
MCH: 23.2 pg — ABNORMAL LOW (ref 26.0–34.0)
MCHC: 32 g/dL (ref 30.0–36.0)
MCV: 72.5 fL — ABNORMAL LOW (ref 80.0–100.0)
Platelets: 264 10*3/uL (ref 150–400)
RBC: 4.22 MIL/uL (ref 3.87–5.11)
RDW: 15.2 % (ref 11.5–15.5)
WBC: 11.7 10*3/uL — ABNORMAL HIGH (ref 4.0–10.5)
nRBC: 0 % (ref 0.0–0.2)

## 2023-04-03 LAB — MAGNESIUM
Magnesium: 2.1 mg/dL (ref 1.7–2.4)
Magnesium: 2 mg/dL (ref 1.7–2.4)

## 2023-04-03 LAB — BASIC METABOLIC PANEL
Anion gap: 12 (ref 5–15)
BUN: 18 mg/dL (ref 8–23)
CO2: 20 mmol/L — ABNORMAL LOW (ref 22–32)
Calcium: 8.7 mg/dL — ABNORMAL LOW (ref 8.9–10.3)
Chloride: 108 mmol/L (ref 98–111)
Creatinine, Ser: 0.61 mg/dL (ref 0.44–1.00)
GFR, Estimated: >60 mL/min (ref >60)
Glucose, Bld: 236 mg/dL — ABNORMAL HIGH (ref 70–99)
Potassium: 3.9 mmol/L (ref 3.5–5.1)
Sodium: 140 mmol/L (ref 135–145)

## 2023-04-03 LAB — PHOSPHORUS
Phosphorus: 2.3 mg/dL — ABNORMAL LOW (ref 2.5–4.6)
Phosphorus: 2.7 mg/dL (ref 2.5–4.6)

## 2023-04-03 MED ORDER — POTASSIUM PHOSPHATES 15 MMOLE/5ML IV SOLN
15.0000 mmol | Freq: Once | INTRAVENOUS | Status: DC
  Filled 2023-04-03: qty 5

## 2023-04-03 MED ORDER — LABETALOL HCL 5 MG/ML IV SOLN: 10.0000 mg | INTRAVENOUS | Status: DC | PRN

## 2023-04-03 MED ORDER — BUDESONIDE 0.5 MG/2ML IN SUSP
0.5000 mg | Freq: Two times a day (BID) | RESPIRATORY_TRACT | Status: DC
  Administered 2023-04-03 – 2023-04-05 (×5): 0.5 mg via RESPIRATORY_TRACT
  Filled 2023-04-03 (×5): qty 2

## 2023-04-03 MED ORDER — SODIUM CHLORIDE 0.9 % IV SOLN
1.5000 g | Freq: Four times a day (QID) | INTRAVENOUS | Status: DC
  Administered 2023-04-03 – 2023-04-05 (×8): 1.5 g via INTRAVENOUS
  Filled 2023-04-03 (×10): qty 4

## 2023-04-03 MED ORDER — IPRATROPIUM-ALBUTEROL 0.5-2.5 (3) MG/3ML IN SOLN: 3.0000 mL | RESPIRATORY_TRACT | Status: DC | PRN

## 2023-04-03 MED ORDER — OXYCODONE HCL 5 MG PO TABS
5.0000 mg | ORAL_TABLET | Freq: Four times a day (QID) | ORAL | Status: DC | PRN
Start: 2023-04-03 — End: 2023-04-05
  Administered 2023-04-03: 5 mg
  Administered 2023-04-03 – 2023-04-04 (×2): 10 mg
  Administered 2023-04-04: 5 mg
  Administered 2023-04-04 (×2): 10 mg
  Filled 2023-04-03 (×2): qty 2
  Filled 2023-04-03: qty 1
  Filled 2023-04-03 (×3): qty 2

## 2023-04-03 MED ORDER — ARFORMOTEROL TARTRATE 15 MCG/2ML IN NEBU
15.0000 ug | INHALATION_SOLUTION | Freq: Two times a day (BID) | RESPIRATORY_TRACT | Status: DC
  Administered 2023-04-03 – 2023-04-06 (×6): 15 ug via RESPIRATORY_TRACT
  Filled 2023-04-03 (×6): qty 2

## 2023-04-03 MED ORDER — POTASSIUM PHOSPHATES 15 MMOLE/5ML IV SOLN
15.0000 mmol | Freq: Once | INTRAVENOUS | Status: AC
  Administered 2023-04-03: 15 mmol via INTRAVENOUS
  Filled 2023-04-03: qty 5

## 2023-04-03 MED ORDER — LABETALOL HCL 5 MG/ML IV SOLN
10.0000 mg | INTRAVENOUS | Status: DC | PRN
Start: 2023-04-03 — End: 2023-04-06
  Administered 2023-04-03 – 2023-04-05 (×2): 10 mg via INTRAVENOUS
  Filled 2023-04-03 (×2): qty 4

## 2023-04-03 MED ORDER — REVEFENACIN 175 MCG/3ML IN SOLN
175.0000 ug | Freq: Every day | RESPIRATORY_TRACT | Status: DC
  Administered 2023-04-03 – 2023-04-06 (×4): 175 ug via RESPIRATORY_TRACT
  Filled 2023-04-03 (×4): qty 3

## 2023-04-03 MED ORDER — INSULIN GLARGINE-YFGN 100 UNIT/ML ~~LOC~~ SOLN
8.0000 [IU] | Freq: Two times a day (BID) | SUBCUTANEOUS | Status: DC
  Administered 2023-04-03 – 2023-04-04 (×3): 8 [IU] via SUBCUTANEOUS
  Filled 2023-04-03 (×4): qty 0.08

## 2023-04-03 NOTE — Consult Note (Signed)
 Palliative Care Consult Note                                  Date: 04/03/2023   Patient Name: Ann Chaney  DOB: 1945/04/23  MRN: [id number]  Age / Sex: 78 y.o., female  PCP: Joshua Cathryne BROCKS, MD Referring Physician: Harold Scholz, MD  Reason for Consultation: Establishing goals of care  HPI/Patient Profile: 78 y.o. female  with past medical history of hypertension, diabetes, glaucoma, hyperthyroidism with goiter, meningioma causing seizure status post resection in 2022 admitted on 03/31/2023 when her family drove her to the ED after she had altered mental status, facial droop with left-sided weakness, and seizure-like activity.   CT showed a new 2.5 cm abnormality in the right superior temporal lobe with surrounding vasogenic edema. She was intubated ultimately for airway protection. She was transferred to [facility] for ICU admission and continuous EEG monitoring.   Found to have a primary lung mass that is necrotic with metastases.  Past Medical History:  Diagnosis Date   Diabetes mellitus without complication (HCC)    Glaucoma    Glaucoma    Hyperlipidemia    Hypertension     Subjective:   I have reviewed medical records including EPIC notes, labs and imaging, assessed the patient and then met with the patient's daughter Ann Chaney and sister to discuss diagnosis prognosis, GOC, EOL wishes, disposition and options.  I introduced Palliative Medicine as specialized medical care for people living with serious illness. It focuses on providing relief from symptoms and stress of a serious illness. The goal is to improve quality of life for both the patient and the family.   Today's Discussion: Patient's daughter has a good understanding of the patient's chronic and acute conditions. She spoke with the oncologist yesterday night. She understands that the patient has metastatic and incurable cancer. I reviewed my understanding, including  limitations of ongoing interventions and high risk for further decline despite aggressive treatment efforts. Patient's family share this news has been especially difficult to process because of how quickly the patient's condition worsened.  Around two years ago the patient asked her daughter Ann Chaney to move in with her. Prior to admission the patient has been able to complete ADLs and most IADLs independently. In October she started using a cane as she had ongoing left leg pain. She has a dog Ann Chaney  that she spoils. 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   A discussion was had today regarding advanced directives. Ann Chaney shares that the patient has a living will (which is not in [place]). The family share that they believe the patient has stated that she would not want to be resuscitated or be on machines to support her life. I share that in our system she is a FULL code and they understand there is a discrepancy but do not want to make changes until they can verify with her living will.   We discuss that the patient's prognosis is very poor and we need to consider what she would want to do moving forward if she could make the decision. The difference between a aggressive medical intervention path and a palliative comfort care path for this patient at this time was had. We discussed what a transition to comfort care would look like for this patient. If transitioned to comfort care the patient would no longer receive aggressive medical  interventions such as continuous vital signs, lab work, radiology testing, or medications not focused on comfort. All care would focus on how the patient is looking and feeling. This would include management of any symptoms that may cause discomfort, pain, shortness of breath, cough, nausea, agitation, anxiety, and/or secretions etc. Symptoms would be managed with medications and other non-pharmacological interventions.   Family share that  they need time to consider the options and review the living will. I encourage them to considering the patient's quality of life, overall suffering, what would be acceptable to her if she were able to make this decision.  Discussed the importance of continued conversation with family and the medical providers regarding overall plan of care and treatment options, ensuring decisions are within the context of the patient's values and GOCs. We plan to meet again Sunday to discuss goals of care further.  Questions and concerns were addressed. Hard Choices booklet left for review. The family was encouraged to call with questions or concerns. PMT will continue to support holistically.  Review of Systems  Unable to perform ROS   Objective:   Primary Diagnoses: Present on Admission:  Status epilepticus [facility])   Physical Exam Vitals reviewed.  Constitutional:      Appearance: She is ill-appearing.     Interventions: She is sedated.     Comments: mitts  HENT:     Head: Normocephalic and atraumatic.  Cardiovascular:     Rate and Rhythm: Normal rate.  Pulmonary:     Comments: intubated    Vital Signs:  BP (!) 143/63   Pulse 93   Temp 98.2 F (36.8 C) (Axillary)   Resp (!) 28   Ht 5' 4 (1.626 m)   Wt 39.4 kg   SpO2 100%   BMI 14.91 kg/m   Palliative Assessment/Data: 30% with feeds    Advanced Care Planning:   Existing Vynca/ACP Documentation: None  Primary Decision Maker: Patient's daugher Ann Chaney  Code Status/Advance Care Planning: Full code     Assessment & Plan:   SUMMARY OF RECOMMENDATIONS   Full code Full scope Continue current treatment plan while family reviews patient's living will Plan to talk/meet Sunday re: goals of care moving forwared Continued PMT support   Discussed with: bedside RN and Dr. Harold  Time Total: 85 minutes  Thank you for allowing us  to participate in the care of Ann Chaney PMT will continue to support  holistically.   Signed by: Stephane Palin, NP Palliative Medicine Team  Team Phone # [phone] (Nights/Weekends)  04/03/2023, 12:05 PM

## 2023-04-03 NOTE — IPAL (Signed)
  Interdisciplinary Goals of Care Family Meeting   Date carried out: 04/03/2023  Location of the meeting: Bedside  Member's involved: Nurse Practitioner, Family Member or next of kin, and Other: NP student   Durable Power of Attorney or environmental health practitioner: daughter    Discussion: We discussed goals of care for [employer] .  Discussed palliative care meeting. Emotional support provided amidst the very difficult news of her incurable disease process. They are considering goals of care.  We specifically talked about the ventilator -- from a pulm mechanics standpoint she is doing very well on PSV and I believe she could be successfully extubated today, but think we should give thought to what our plan should be if she declines. Family shares that she would have never wanted to be on a ventilator, but are struggling with the unknowns of remaining lifespan following extubation. They would like time before potentially moving forward with extubation, and understand pros/cons of this. It sounds like they have plans to talk with palliative care again Sunday. Will plan to cont MV for now.   Code status:   Code Status: Full Code   Disposition: Continue current acute care  Time spent for the meeting: 18 min     Ronnald FORBES Gave, NP  04/03/2023, 12:26 PM

## 2023-04-03 NOTE — Plan of Care (Addendum)
 Upon initial assessment patient was found to have increased tachycardia and increased secretions. PRN metoprolol  given twice, minimal relief noted. PRN Fentanyl  and oxy given for pain, patient showed signs of relief. Mid-afternoon pt became hypertensive, provider notified, PRN Labetalol  given, vital signs returned to normal parameters. Voiding spontaneously throughout shift. Patient and family updated in plan of care. ICU status maintained.   Problem: Education: Goal: Knowledge of General Education information will improve Description: Including pain rating scale, medication(s)/side effects and non-pharmacologic comfort measures Outcome: Not Progressing   Problem: Health Behavior/Discharge Planning: Goal: Ability to manage health-related needs will improve Outcome: Not Progressing   Problem: Clinical Measurements: Goal: Ability to maintain clinical measurements within normal limits will improve Outcome: Not Progressing Goal: Will remain free from infection Outcome: Not Progressing Goal: Diagnostic test results will improve Outcome: Not Progressing Goal: Respiratory complications will improve Outcome: Not Progressing Goal: Cardiovascular complication will be avoided Outcome: Not Progressing   Problem: Activity: Goal: Risk for activity intolerance will decrease Outcome: Not Progressing   Problem: Nutrition: Goal: Adequate nutrition will be maintained Outcome: Not Progressing   Problem: Coping: Goal: Level of anxiety will decrease Outcome: Not Progressing   Problem: Elimination: Goal: Will not experience complications related to bowel motility Outcome: Not Progressing Goal: Will not experience complications related to urinary retention Outcome: Not Progressing   Problem: Pain Management: Goal: General experience of comfort will improve Outcome: Not Progressing   Problem: Safety: Goal: Ability to remain free from injury will improve Outcome: Not Progressing   Problem:  Skin Integrity: Goal: Risk for impaired skin integrity will decrease Outcome: Not Progressing   Problem: Education: Goal: Expressions of having a comfortable level of knowledge regarding the disease process will increase Outcome: Not Progressing   Problem: Coping: Goal: Ability to adjust to condition or change in health will improve Outcome: Not Progressing Goal: Ability to identify appropriate support needs will improve Outcome: Not Progressing   Problem: Health Behavior/Discharge Planning: Goal: Compliance with prescribed medication regimen will improve Outcome: Not Progressing   Problem: Medication: Goal: Risk for medication side effects will decrease Outcome: Not Progressing   Problem: Clinical Measurements: Goal: Complications related to the disease process, condition or treatment will be avoided or minimized Outcome: Not Progressing Goal: Diagnostic test results will improve Outcome: Not Progressing   Problem: Safety: Goal: Verbalization of understanding the information provided will improve Outcome: Not Progressing   Problem: Self-Concept: Goal: Level of anxiety will decrease Outcome: Not Progressing Goal: Ability to verbalize feelings about condition will improve Outcome: Not Progressing   Problem: Safety: Goal: Non-violent Restraint(s) Outcome: Not Progressing   Problem: Education: Goal: Ability to describe self-care measures that may prevent or decrease complications (Diabetes Survival Skills Education) will improve Outcome: Not Progressing Goal: Individualized Educational Video(s) Outcome: Not Progressing   Problem: Coping: Goal: Ability to adjust to condition or change in health will improve Outcome: Not Progressing   Problem: Fluid Volume: Goal: Ability to maintain a balanced intake and output will improve Outcome: Not Progressing   Problem: Health Behavior/Discharge Planning: Goal: Ability to identify and utilize available resources and services  will improve Outcome: Not Progressing Goal: Ability to manage health-related needs will improve Outcome: Not Progressing   Problem: Metabolic: Goal: Ability to maintain appropriate glucose levels will improve Outcome: Not Progressing   Problem: Nutritional: Goal: Maintenance of adequate nutrition will improve Outcome: Not Progressing Goal: Progress toward achieving an optimal weight will improve Outcome: Not Progressing   Problem: Skin Integrity: Goal: Risk for impaired skin integrity will  decrease Outcome: Not Progressing   Problem: Tissue Perfusion: Goal: Adequacy of tissue perfusion will improve Outcome: Not Progressing

## 2023-04-03 NOTE — Progress Notes (Addendum)
 NAME:  Ann Chaney, MRN:  [id number], DOB:  04/04/1945, LOS: 3 ADMISSION DATE:  03/31/2023, CONSULTATION DATE:  1/7 REFERRING MD:  Dr. Willo EDP, CHIEF COMPLAINT:  Status epilepticus    History of Present Illness:  78 year old female with past medical history as below, which is significant for hypertension, diabetes, glaucoma, hyperthyroidism with goiter, meningioma causing seizure status post resection in 2022. She was in her usual state of health living with her daughter but quite independent on 1/7 when she developed alered mental status and facial droop with left sided weakness. She also had shaking activity and foaming at the mouth. Family brought her to [facility] ED by personal vehicle. Upon arrival to the ED a code stroke was initiated.  She was sent urgently for CT of the head and immediately following the scan the patient began to experience spasmodic, repetitive, and rhythmic left facial twitching and foaming at the mouth.  This resolved spontaneously after about 2 minutes.  She was given Ativan  and Keppra  load.  CT showed a new 2.5 cm abnormality in the right superior temporal lobe with surrounding vasogenic edema.  She was intubated ultimately for airway protection.  Spot EEG was negative for active seizure.  She was transferred to [facility] for ICU admission and continuous EEG monitoring.  Pertinent  Medical History   has a past medical history of Diabetes mellitus without complication (HCC), Glaucoma, Glaucoma, Hyperlipidemia, and Hypertension.   Significant Hospital Events: Including procedures, antibiotic start and stop dates in addition to other pertinent events   1/7 tx from [facility] ER for AMS, seizures, intubated  1/8 EBUS for lung mass  1/9 wean sedation. Onc to see-- recommending hospice  1/10 family meeting scheduled with palliative    Interim History / Subjective:   Seen by onc yesterday-- there are not good options given advanced metastatic nature of this process and they  recommend hospice care   Objective   Blood pressure 137/69, pulse (!) 101, temperature 98.2 F (36.8 C), temperature source Axillary, resp. rate 14, height 5' 4 (1.626 m), weight 39.4 kg, SpO2 100%.    Vent Mode: PSV;CPAP FiO2 (%):  [40 %] 40 % Set Rate:  [16 bmp] 16 bmp Vt Set:  [440 mL] 440 mL PEEP:  [5 cmH20] 5 cmH20 Pressure Support:  [8 cmH20] 8 cmH20 Plateau Pressure:  [12 cmH20-21 cmH20] 16 cmH20   Intake/Output Summary (Last 24 hours) at 04/03/2023 0925 Last data filed at 04/03/2023 0700 Gross per 24 hour  Intake 1808.07 ml  Output 225 ml  Net 1583.07 ml   Filed Weights   04/01/23 1453 04/02/23 0417 04/03/23 0709  Weight: 42.4 kg 39.4 kg 39.4 kg    Examination:  General:  critically and chronically ill F intubated lightly sedated  HEENT: ETT secured w tape. Anicteric sclera  Neuro: Awakens, moves spontaneously but isnt following commands  CV: rrr cap refill < 3 sec  PULM:  Bilat wheeze. Even unlabored on PSV  GI: soft ndnt  Extremities: no acute joint deformity. Bilat wrist restraints  Skin: c/d/w    Resolved Hospital Problem list   Focal status epilepticus  Hypokalemia  Assessment & Plan:   focal status epilepticus 2/2 brain tumor  P -PRN BZD  -[facility] Keppra   -sz precautions   Advanced metastatic dz -R temporal lobe mass with vasogenic edema (brain compression)  -necrotic L nilar mass  -Numerous bilateral lung nodules  -Bone mets  P - appreciate oncology's insight -- given advanced metastatic process, this this  is an incurable disease. She is not a candidate for chemo. I agree w recommendation for comfort care/hospice consideration -decadron   - EBUS results pending   Acute resp failure w hypoxia  LUL PNA  Hx tobacco use  Presumed COPD  P -WUA/SBT -- mechanics ok 1/10 but is having incr secretions. Think mentation will play a large role  -short course unasyn   -adding triple therapy, keeping PRN duoneb.  -wean precedex , enteral oxy 5-10mg  PRN  available. PRN fent IVP  -dc wrist restraints   DM w hyperglycemia  - CBG q 4, SSI prn w/ steroids  Hx HTN  -holding home meds at present. Has had some variable pressures, largely sedation related   Hyperthyroidism with goiter - S/p FNA 11/4, cytology not available in care everywhere P -tapazole    Anemia -PRN CBC   Severe protein calorie malnutrition (BMI 14.87) FTT in adult  -EN per RDN   GOC From PCCM note 1/8:  Pt lives w/daughter Luke.  Updated by phone 1/8 on imaging findings.  Reports functional decline, voice changes, change in cough (spots of blood pt attributed to strong cough), and significant unintentional wt loss all progressive over the last year, all in which pt attributed to her thyroid  goiter.  Pt has put off seeking further medical evaluation despite family concerns.  Will consult PMT for ongoing GOC.  Code status was addressed with concerns with her age, malnutrition, and new metastatic disease, that pt would poorly tolerate/ recover from CPR.  Pt to remains full code for now, pending further workup/ prognosis.  Daughter to bring in her living will but verbalizes she knows pt would not want life prolonging measures.   -Palliative care is to meet with family 1/10 . Oncology has advised that this is incurable & she is not a chemo candidate. Hospice is recommended and I agree    Best Practice (right click and Reselect all SmartList Selections daily)   Diet/type: NPO EN  DVT prophylaxis prophylactic heparin   Pressure ulcer(s): N/A GI prophylaxis: H2B Lines: N/A Foley:  N/A Code Status:  full code Last date of multidisciplinary goals of care discussion [1/9}  Labs   CBC: Recent Labs  Lab 03/31/23 1227 03/31/23 2212 03/31/23 2309 04/01/23 0901 04/02/23 0459 04/03/23 0527  WBC 6.1 7.5  --  11.6* 15.6* 11.7*  NEUTROABS 2.4 6.2  --   --   --   --   HGB 12.6 12.4 12.6 11.7* 11.0* 9.8*  HCT 39.1 39.5 37.0 36.0 34.7* 30.6*  MCV 71.1* 73.8*  --  71.4*  72.7* 72.5*  PLT 426* 324  --  352 305 264    Basic Metabolic Panel: Recent Labs  Lab 03/31/23 1227 03/31/23 2212 03/31/23 2309 04/01/23 0552 04/01/23 1723 04/02/23 0459 04/02/23 1649 04/03/23 0527 04/03/23 0557  NA 138 139 138 138 141 142  --   --  140  K 3.2* 3.9 3.6 3.8 5.0 3.5  --   --  3.9  CL 101 105  --  106 110 108  --   --  108  CO2 22 18*  --  17* 18* 21*  --   --  20*  GLUCOSE 149* 212*  --  160* 137* 158*  --   --  236*  BUN 12 10  --  14 15 18   --   --  18  CREATININE 0.66 0.71  --  0.92 0.77 1.01*  --   --  0.61  CALCIUM  9.7 9.0  --  9.2 8.7* 9.2  --   --  8.7*  MG 1.8  --   --  2.1  --  2.0 2.4 2.1  --   PHOS  --  3.8  --  3.2  --  3.4 1.8* 2.3*  --    GFR: Estimated Creatinine Clearance: 37.2 mL/min (by C-G formula based on SCr of 0.61 mg/dL). Recent Labs  Lab 03/31/23 2212 04/01/23 0901 04/02/23 0459 04/03/23 0527  PROCALCITON <0.10 <0.10  --   --   WBC 7.5 11.6* 15.6* 11.7*    Liver Function Tests: Recent Labs  Lab 03/31/23 1227 03/31/23 2212  AST 17 17  ALT 9 9  ALKPHOS 125 104  BILITOT 0.8 0.9  PROT 7.9 6.7  ALBUMIN 4.4 3.6   No results for input(s): LIPASE, AMYLASE in the last 168 hours. No results for input(s): AMMONIA in the last 168 hours.  ABG    Component Value Date/Time   PHART 7.378 03/31/2023 2309   PCO2ART 31.0 (L) 03/31/2023 2309   PO2ART 134 (H) 03/31/2023 2309   HCO3 18.4 (L) 03/31/2023 2309   TCO2 19 (L) 03/31/2023 2309   ACIDBASEDEF 6.0 (H) 03/31/2023 2309   O2SAT 99 03/31/2023 2309     Coagulation Profile: Recent Labs  Lab 03/31/23 1227  INR 1.2    Cardiac Enzymes: Recent Labs  Lab 03/31/23 1227  CKTOTAL 51    HbA1C: Hemoglobin A1C  Date/Time Value Ref Range Status  06/17/2022 12:00 AM 6.9  Final  05/08/2020 12:00 AM 5.7  Final   Hgb A1c MFr Bld  Date/Time Value Ref Range Status  03/31/2023 10:12 PM 5.6 4.8 - 5.6 % Final    Comment:    (NOTE)         Prediabetes: 5.7 - 6.4          Diabetes: >6.4         Glycemic control for adults with diabetes: <7.0   05/27/2022 09:53 AM 6.9 (H) 4.8 - 5.6 % Final    Comment:             Prediabetes: 5.7 - 6.4          Diabetes: >6.4          Glycemic control for adults with diabetes: <7.0     CBG: Recent Labs  Lab 04/02/23 1536 04/02/23 1950 04/02/23 2347 04/03/23 0337 04/03/23 0715  GLUCAP 200* 237* 205* 104* 263*   Allergies Allergies  Allergen Reactions   Lipitor [Atorvastatin ] Other (See Comments)    Myalgias   Pravachol  [Pravastatin  Sodium] Nausea Only     CRITICAL CARE Performed by: Ronnald FORBES Gave   Total critical care time: 40 minutes  Critical care time was exclusive of separately billable procedures and treating other patients. Critical care was necessary to treat or prevent imminent or life-threatening deterioration.  Critical care was time spent personally by me on the following activities: development of treatment plan with patient and/or surrogate as well as nursing, discussions with consultants, evaluation of patient's response to treatment, examination of patient, obtaining history from patient or surrogate, ordering and performing treatments and interventions, ordering and review of laboratory studies, ordering and review of radiographic studies, pulse oximetry and re-evaluation of patient's condition.  Ronnald Gave MSN, AGACNP-BC [facility] Amion for pager  04/03/2023, 9:25 AM

## 2023-04-03 NOTE — Progress Notes (Signed)
 Pharmacy Antibiotic Note  Ann Chaney is a 78 y.o. female admitted on 03/31/2023 with aspiration pneumonia.  Pharmacy has been consulted for Unasyn  dosing.  WBC trended down to 11.7, afebrile, Scr trended down to 0.61.   Plan: Adjust unasyn  to 1.5 g IV every 6 hours for a total of 5 days of therapy Monitor renal function, cultures, and overall clinical picture  Height: 5' 4 (162.6 cm) Weight: 39.4 kg (86 lb 13.8 oz) IBW/kg (Calculated) : 54.7  Temp (24hrs), Avg:98.8 F (37.1 C), Min:98.2 F (36.8 C), Max:99.9 F (37.7 C)  Recent Labs  Lab 03/31/23 1227 03/31/23 2212 04/01/23 0552 04/01/23 0901 04/01/23 1723 04/02/23 0459 04/03/23 0527 04/03/23 0557  WBC 6.1 7.5  --  11.6*  --  15.6* 11.7*  --   CREATININE 0.66 0.71 0.92  --  0.77 1.01*  --  0.61    Estimated Creatinine Clearance: 37.2 mL/min (by C-G formula based on SCr of 0.61 mg/dL).    Allergies  Allergen Reactions   Lipitor [Atorvastatin ] Other (See Comments)    Myalgias   Pravachol  [Pravastatin  Sodium] Nausea Only    Antimicrobials this admission: Unasyn  1/7 >> (1/12)   Dose adjustments this admission:  Microbiology results: 1/7 MRSA PCR: neg 1/7 Bcx: ngtd  Pharmacy will sign off and monitor peripherally. Please re-consult if needed.   Thank you for involving pharmacy in the patient's care.   Hubert Ruths, PharmD PGY1 Acute Care Pharmacy Resident  04/03/2023 8:20 AM

## 2023-04-04 DIAGNOSIS — G40901 Epilepsy, unspecified, not intractable, with status epilepticus: Secondary | ICD-10-CM | POA: Diagnosis not present

## 2023-04-04 LAB — GLUCOSE, CAPILLARY
Glucose-Capillary: 119 mg/dL — ABNORMAL HIGH (ref 70–99)
Glucose-Capillary: 141 mg/dL — ABNORMAL HIGH (ref 70–99)
Glucose-Capillary: 175 mg/dL — ABNORMAL HIGH (ref 70–99)
Glucose-Capillary: 204 mg/dL — ABNORMAL HIGH (ref 70–99)
Glucose-Capillary: 204 mg/dL — ABNORMAL HIGH (ref 70–99)
Glucose-Capillary: 76 mg/dL (ref 70–99)

## 2023-04-04 LAB — TRIGLYCERIDES: Triglycerides: 68 mg/dL (ref <150)

## 2023-04-04 MED ORDER — FENTANYL 2500MCG IN NS 250ML (10MCG/ML) PREMIX INFUSION
INTRAVENOUS | Status: AC
  Filled 2023-04-04: qty 250

## 2023-04-04 MED ORDER — FENTANYL CITRATE PF 50 MCG/ML IJ SOSY: 25.0000 ug | PREFILLED_SYRINGE | Freq: Once | INTRAMUSCULAR | Status: DC

## 2023-04-04 MED ORDER — INSULIN ASPART 100 UNIT/ML IJ SOLN
0.0000 [IU] | INTRAMUSCULAR | Status: DC
  Administered 2023-04-04: 3 [IU] via SUBCUTANEOUS
  Administered 2023-04-04: 7 [IU] via SUBCUTANEOUS
  Administered 2023-04-05: 4 [IU] via SUBCUTANEOUS

## 2023-04-04 MED ORDER — FENTANYL BOLUS VIA INFUSION
25.0000 ug | INTRAVENOUS | Status: DC | PRN
  Administered 2023-04-04: 25 ug via INTRAVENOUS
  Administered 2023-04-04: 50 ug via INTRAVENOUS
  Administered 2023-04-04 (×2): 25 ug via INTRAVENOUS
  Administered 2023-04-05: 100 ug via INTRAVENOUS
  Administered 2023-04-05 (×3): 25 ug via INTRAVENOUS

## 2023-04-04 MED ORDER — INSULIN GLARGINE-YFGN 100 UNIT/ML ~~LOC~~ SOLN
10.0000 [IU] | Freq: Every day | SUBCUTANEOUS | Status: DC
  Filled 2023-04-04: qty 0.1

## 2023-04-04 MED ORDER — CHLORHEXIDINE GLUCONATE CLOTH 2 % EX PADS
6.0000 | MEDICATED_PAD | Freq: Every day | CUTANEOUS | Status: DC
  Administered 2023-04-04: 6 via TOPICAL

## 2023-04-04 MED ORDER — FENTANYL 2500MCG IN NS 250ML (10MCG/ML) PREMIX INFUSION
25.0000 ug/h | INTRAVENOUS | Status: DC
Start: 2023-04-04 — End: 2023-04-05
  Administered 2023-04-04: 25 ug/h via INTRAVENOUS

## 2023-04-04 NOTE — Progress Notes (Signed)
 NAME:  Ann Chaney, MRN:  [id number], DOB:  May 28, 1945, LOS: 4 ADMISSION DATE:  03/31/2023, CONSULTATION DATE:  1/7 REFERRING MD:  Dr. Willo EDP, CHIEF COMPLAINT:  Status epilepticus    History of Present Illness:  78 year old female with past medical history as below, which is significant for hypertension, diabetes, glaucoma, hyperthyroidism with goiter, meningioma causing seizure status post resection in 2022. She was in her usual state of health living with her daughter but quite independent on 1/7 when she developed alered mental status and facial droop with left sided weakness. She also had shaking activity and foaming at the mouth. Family brought her to [facility] ED by personal vehicle. Upon arrival to the ED a code stroke was initiated.  She was sent urgently for CT of the head and immediately following the scan the patient began to experience spasmodic, repetitive, and rhythmic left facial twitching and foaming at the mouth.  This resolved spontaneously after about 2 minutes.  She was given Ativan  and Keppra  load.  CT showed a new 2.5 cm abnormality in the right superior temporal lobe with surrounding vasogenic edema.  She was intubated ultimately for airway protection.  Spot EEG was negative for active seizure.  She was transferred to [facility] for ICU admission and continuous EEG monitoring.  Pertinent  Medical History   has a past medical history of Diabetes mellitus without complication (HCC), Glaucoma, Glaucoma, Hyperlipidemia, and Hypertension.   Significant Hospital Events: Including procedures, antibiotic start and stop dates in addition to other pertinent events   1/7 tx from [facility] ER for AMS, seizures, intubated  1/8 EBUS for lung mass  1/9 wean sedation. Onc to see-- recommending hospice  1/10 family meeting scheduled with palliative    Interim History / Subjective:   This morning requiring incr sedation -- lots of coughing bouts on the vent   Objective   Blood pressure  (!) 90/46, pulse 97, temperature 98.6 F (37 C), temperature source Oral, resp. rate 16, height 5' 4 (1.626 m), weight 39.4 kg, SpO2 100%.    Vent Mode: PRVC FiO2 (%):  [40 %] 40 % Set Rate:  [16 bmp] 16 bmp Vt Set:  [440 mL] 440 mL PEEP:  [5 cmH20] 5 cmH20 Pressure Support:  [8 cmH20] 8 cmH20 Plateau Pressure:  [13 cmH20-20 cmH20] 15 cmH20   Intake/Output Summary (Last 24 hours) at 04/04/2023 1225 Last data filed at 04/04/2023 0700 Gross per 24 hour  Intake 1820.76 ml  Output 700 ml  Net 1120.76 ml   Filed Weights   04/01/23 1453 04/02/23 0417 04/03/23 0709  Weight: 42.4 kg 39.4 kg 39.4 kg    Examination:  General:  Critically and chronically ill older adult F intubated sedated  HEENT: NCAT ETT secure anicteric sclera  Neuro: Sedated  CV: tachycardic  PULM:  Symmetrical chest expansion. Faint wheeze  GI: soft round  Extremities: no acute joint deformity. Trace edema  Skin: c/d/w    Resolved Hospital Problem list   Focal status epilepticus  Hypokalemia  Assessment & Plan:   Focal SE 2/2 tumor, improved  P -cont keppra  -sz precautions -PRN BZD    Advanced metastatic disease  -R temporal lobe mass with vasogenic edema (brain compression)  -necrotic L nilar mass  -Numerous bilateral lung nodules  -Bone mets  P -terminal process, not a candidate for chemo. I agree with hospice recommendations. Palliative care is following  -decadron  -EBUS results are still pending   Acute resp failure w hypoxia LUL PNA Hx  tobacco use Presumed COPD  P -WUA/ SBT as able  -unasyn  -cont triple therapy  -escalated to adding fent gtt 1/11 for PAD with incr coughing  -we are deferring extubation until family comes back to hospital & has clarity re reintubation vs 1-way    DM w hyperglycemia  - appreciate pharmD help with adjusting SSI   HTN  -PRN labetalol    Hyperthyroidism with goiter - S/p FNA 11/4, cytology not available in care everywhere P -tapazole     Anemia -PRN CBC   Severe protein calorie malnutrition (BMI 14.87) FTT in adult  -EN per RDN   GOC From PCCM note 1/8:  Pt lives w/daughter Luke.  Updated by phone 1/8 on imaging findings.  Reports functional decline, voice changes, change in cough (spots of blood pt attributed to strong cough), and significant unintentional wt loss all progressive over the last year, all in which pt attributed to her thyroid  goiter.  Pt has put off seeking further medical evaluation despite family concerns.  Will consult PMT for ongoing GOC.  Code status was addressed with concerns with her age, malnutrition, and new metastatic disease, that pt would poorly tolerate/ recover from CPR.  Pt to remains full code for now, pending further workup/ prognosis.  Daughter to bring in her living will but verbalizes she knows pt would not want life prolonging measures.   -terminal cancer, not a candidate for chemo. Palliative following. There are plans to revist GOC 1/12 it sounds like. In interim, family wants to remain on vent  regardless of extubation readiness while they figure out if they would want her to be reintubated should she decline subsequently     Best Practice (right click and Reselect all SmartList Selections daily)   Diet/type: NPO EN  DVT prophylaxis prophylactic heparin   Pressure ulcer(s): N/A GI prophylaxis: H2B Lines: N/A Foley:  N/A Code Status:  full code Last date of multidisciplinary goals of care discussion [1/10}  Labs   CBC: Recent Labs  Lab 03/31/23 1227 03/31/23 2212 03/31/23 2309 04/01/23 0901 04/02/23 0459 04/03/23 0527  WBC 6.1 7.5  --  11.6* 15.6* 11.7*  NEUTROABS 2.4 6.2  --   --   --   --   HGB 12.6 12.4 12.6 11.7* 11.0* 9.8*  HCT 39.1 39.5 37.0 36.0 34.7* 30.6*  MCV 71.1* 73.8*  --  71.4* 72.7* 72.5*  PLT 426* 324  --  352 305 264    Basic Metabolic Panel: Recent Labs  Lab 03/31/23 2212 03/31/23 2309 04/01/23 0552 04/01/23 1723 04/02/23 0459  04/02/23 1649 04/03/23 0527 04/03/23 0557 04/03/23 1713  NA 139 138 138 141 142  --   --  140  --   K 3.9 3.6 3.8 5.0 3.5  --   --  3.9  --   CL 105  --  106 110 108  --   --  108  --   CO2 18*  --  17* 18* 21*  --   --  20*  --   GLUCOSE 212*  --  160* 137* 158*  --   --  236*  --   BUN 10  --  14 15 18   --   --  18  --   CREATININE 0.71  --  0.92 0.77 1.01*  --   --  0.61  --   CALCIUM  9.0  --  9.2 8.7* 9.2  --   --  8.7*  --   MG  --   --  2.1  --  2.0 2.4 2.1  --  2.0  PHOS 3.8  --  3.2  --  3.4 1.8* 2.3*  --  2.7   GFR: Estimated Creatinine Clearance: 37.2 mL/min (by C-G formula based on SCr of 0.61 mg/dL). Recent Labs  Lab 03/31/23 2212 04/01/23 0901 04/02/23 0459 04/03/23 0527  PROCALCITON <0.10 <0.10  --   --   WBC 7.5 11.6* 15.6* 11.7*    Liver Function Tests: Recent Labs  Lab 03/31/23 1227 03/31/23 2212  AST 17 17  ALT 9 9  ALKPHOS 125 104  BILITOT 0.8 0.9  PROT 7.9 6.7  ALBUMIN 4.4 3.6   No results for input(s): LIPASE, AMYLASE in the last 168 hours. No results for input(s): AMMONIA in the last 168 hours.  ABG    Component Value Date/Time   PHART 7.378 03/31/2023 2309   PCO2ART 31.0 (L) 03/31/2023 2309   PO2ART 134 (H) 03/31/2023 2309   HCO3 18.4 (L) 03/31/2023 2309   TCO2 19 (L) 03/31/2023 2309   ACIDBASEDEF 6.0 (H) 03/31/2023 2309   O2SAT 99 03/31/2023 2309     Coagulation Profile: Recent Labs  Lab 03/31/23 1227  INR 1.2    Cardiac Enzymes: Recent Labs  Lab 03/31/23 1227  CKTOTAL 51    HbA1C: Hemoglobin A1C  Date/Time Value Ref Range Status  06/17/2022 12:00 AM 6.9  Final  05/08/2020 12:00 AM 5.7  Final   Hgb A1c MFr Bld  Date/Time Value Ref Range Status  03/31/2023 10:12 PM 5.6 4.8 - 5.6 % Final    Comment:    (NOTE)         Prediabetes: 5.7 - 6.4         Diabetes: >6.4         Glycemic control for adults with diabetes: <7.0   05/27/2022 09:53 AM 6.9 (H) 4.8 - 5.6 % Final    Comment:             Prediabetes: 5.7  - 6.4          Diabetes: >6.4          Glycemic control for adults with diabetes: <7.0     CBG: Recent Labs  Lab 04/03/23 1948 04/03/23 2324 04/04/23 0329 04/04/23 0741 04/04/23 1157  GLUCAP 166* 87 204* 76 119*   Allergies Allergies  Allergen Reactions   Lipitor [Atorvastatin ] Other (See Comments)    Myalgias   Pravachol  [Pravastatin  Sodium] Nausea Only     CRITICAL CARE Performed by: Ronnald FORBES Gave   Total critical care time: 36 minutes  Critical care time was exclusive of separately billable procedures and treating other patients. Critical care was necessary to treat or prevent imminent or life-threatening deterioration.  Critical care was time spent personally by me on the following activities: development of treatment plan with patient and/or surrogate as well as nursing, discussions with consultants, evaluation of patient's response to treatment, examination of patient, obtaining history from patient or surrogate, ordering and performing treatments and interventions, ordering and review of laboratory studies, ordering and review of radiographic studies, pulse oximetry and re-evaluation of patient's condition.  Ronnald Gave MSN, AGACNP-BC [place] [facility] Amion for pager  04/04/2023, 12:25 PM

## 2023-04-04 NOTE — Plan of Care (Signed)
  Problem: Clinical Measurements: Goal: Will remain free from infection Outcome: Progressing Goal: Diagnostic test results will improve Outcome: Progressing Goal: Respiratory complications will improve Outcome: Progressing   Problem: Nutrition: Goal: Adequate nutrition will be maintained Outcome: Progressing   Problem: Elimination: Goal: Will not experience complications related to urinary retention Outcome: Progressing   Problem: Pain Management: Goal: General experience of comfort will improve Outcome: Progressing   Problem: Safety: Goal: Ability to remain free from injury will improve Outcome: Progressing   Problem: Skin Integrity: Goal: Risk for impaired skin integrity will decrease Outcome: Progressing   Problem: Health Behavior/Discharge Planning: Goal: Compliance with prescribed medication regimen will improve Outcome: Progressing   Problem: Medication: Goal: Risk for medication side effects will decrease Outcome: Progressing   Problem: Clinical Measurements: Goal: Complications related to the disease process, condition or treatment will be avoided or minimized Outcome: Progressing Goal: Diagnostic test results will improve Outcome: Progressing   Problem: Safety: Goal: Non-violent Restraint(s) Outcome: Progressing   Problem: Fluid Volume: Goal: Ability to maintain a balanced intake and output will improve Outcome: Progressing   Problem: Metabolic: Goal: Ability to maintain appropriate glucose levels will improve Outcome: Progressing   Problem: Nutritional: Goal: Maintenance of adequate nutrition will improve Outcome: Progressing Goal: Progress toward achieving an optimal weight will improve Outcome: Progressing   Problem: Skin Integrity: Goal: Risk for impaired skin integrity will decrease Outcome: Progressing   Problem: Tissue Perfusion: Goal: Adequacy of tissue perfusion will improve Outcome: Progressing   Problem: Education: Goal:  Knowledge of General Education information will improve Description: Including pain rating scale, medication(s)/side effects and non-pharmacologic comfort measures Outcome: Not Progressing   Problem: Health Behavior/Discharge Planning: Goal: Ability to manage health-related needs will improve Outcome: Not Progressing   Problem: Clinical Measurements: Goal: Ability to maintain clinical measurements within normal limits will improve Outcome: Not Progressing Goal: Cardiovascular complication will be avoided Outcome: Not Progressing   Problem: Activity: Goal: Risk for activity intolerance will decrease Outcome: Not Progressing   Problem: Coping: Goal: Level of anxiety will decrease Outcome: Not Progressing   Problem: Elimination: Goal: Will not experience complications related to bowel motility Outcome: Not Progressing   Problem: Education: Goal: Expressions of having a comfortable level of knowledge regarding the disease process will increase Outcome: Not Progressing   Problem: Coping: Goal: Ability to adjust to condition or change in health will improve Outcome: Not Progressing Goal: Ability to identify appropriate support needs will improve Outcome: Not Progressing   Problem: Safety: Goal: Verbalization of understanding the information provided will improve Outcome: Not Progressing   Problem: Self-Concept: Goal: Level of anxiety will decrease Outcome: Not Progressing Goal: Ability to verbalize feelings about condition will improve Outcome: Not Progressing   Problem: Education: Goal: Ability to describe self-care measures that may prevent or decrease complications (Diabetes Survival Skills Education) will improve Outcome: Not Progressing Goal: Individualized Educational Video(s) Outcome: Not Progressing   Problem: Health Behavior/Discharge Planning: Goal: Ability to identify and utilize available resources and services will improve Outcome: Not Progressing Goal:  Ability to manage health-related needs will improve Outcome: Not Progressing

## 2023-04-05 ENCOUNTER — Other Ambulatory Visit: Payer: Self-pay

## 2023-04-05 DIAGNOSIS — Z7189 Other specified counseling: Secondary | ICD-10-CM | POA: Diagnosis not present

## 2023-04-05 DIAGNOSIS — G40901 Epilepsy, unspecified, not intractable, with status epilepticus: Secondary | ICD-10-CM | POA: Diagnosis not present

## 2023-04-05 LAB — CBC
HCT: 33.8 % — ABNORMAL LOW (ref 36.0–46.0)
Hemoglobin: 10.8 g/dL — ABNORMAL LOW (ref 12.0–15.0)
MCH: 23.3 pg — ABNORMAL LOW (ref 26.0–34.0)
MCHC: 32 g/dL (ref 30.0–36.0)
MCV: 73 fL — ABNORMAL LOW (ref 80.0–100.0)
Platelets: 275 10*3/uL (ref 150–400)
RBC: 4.63 MIL/uL (ref 3.87–5.11)
RDW: 15.1 % (ref 11.5–15.5)
WBC: 19.1 10*3/uL — ABNORMAL HIGH (ref 4.0–10.5)
nRBC: 0 % (ref 0.0–0.2)

## 2023-04-05 LAB — BASIC METABOLIC PANEL
Anion gap: 10 (ref 5–15)
BUN: 12 mg/dL (ref 8–23)
CO2: 26 mmol/L (ref 22–32)
Calcium: 9 mg/dL (ref 8.9–10.3)
Chloride: 105 mmol/L (ref 98–111)
Creatinine, Ser: 0.6 mg/dL (ref 0.44–1.00)
GFR, Estimated: >60 mL/min (ref >60)
Glucose, Bld: 115 mg/dL — ABNORMAL HIGH (ref 70–99)
Potassium: 4.2 mmol/L (ref 3.5–5.1)
Sodium: 141 mmol/L (ref 135–145)

## 2023-04-05 LAB — CULTURE, BLOOD (ROUTINE X 2)
Culture: NO GROWTH
Culture: NO GROWTH

## 2023-04-05 LAB — GLUCOSE, CAPILLARY
Glucose-Capillary: 109 mg/dL — ABNORMAL HIGH (ref 70–99)
Glucose-Capillary: 57 mg/dL — ABNORMAL LOW (ref 70–99)
Glucose-Capillary: 241 mg/dL — ABNORMAL HIGH (ref 70–99)
Glucose-Capillary: 277 mg/dL — ABNORMAL HIGH (ref 70–99)

## 2023-04-05 LAB — MAGNESIUM: Magnesium: 1.9 mg/dL (ref 1.7–2.4)

## 2023-04-05 MED ORDER — ACETAMINOPHEN 650 MG RE SUPP: 650.0000 mg | Freq: Four times a day (QID) | RECTAL | Status: DC | PRN

## 2023-04-05 MED ORDER — DEXTROSE 50 % IV SOLN
INTRAVENOUS | Status: AC
  Administered 2023-04-05: 12.5 g via INTRAVENOUS
  Filled 2023-04-05: qty 50

## 2023-04-05 MED ORDER — GLYCOPYRROLATE 0.2 MG/ML IJ SOLN: 0.2000 mg | INTRAMUSCULAR | Status: DC | PRN

## 2023-04-05 MED ORDER — LORAZEPAM 2 MG/ML IJ SOLN: 2.0000 mg | INTRAMUSCULAR | Status: DC | PRN

## 2023-04-05 MED ORDER — GLYCOPYRROLATE 1 MG PO TABS: 1.0000 mg | ORAL_TABLET | ORAL | Status: DC | PRN

## 2023-04-05 MED ORDER — HALOPERIDOL LACTATE 5 MG/ML IJ SOLN
2.5000 mg | INTRAMUSCULAR | Status: DC | PRN
Start: 2023-04-05 — End: 2023-04-06

## 2023-04-05 MED ORDER — MORPHINE BOLUS VIA INFUSION
5.0000 mg | INTRAVENOUS | Status: DC | PRN
  Administered 2023-04-05 (×2): 5 mg via INTRAVENOUS

## 2023-04-05 MED ORDER — GLYCOPYRROLATE 0.2 MG/ML IJ SOLN
0.2000 mg | INTRAMUSCULAR | Status: DC | PRN
Start: 2023-04-05 — End: 2023-04-06
  Administered 2023-04-05 – 2023-04-06 (×3): 0.2 mg via INTRAVENOUS
  Filled 2023-04-05 (×3): qty 1

## 2023-04-05 MED ORDER — DEXTROSE 50 % IV SOLN: 12.5000 g | INTRAVENOUS | Status: AC

## 2023-04-05 MED ORDER — ACETAMINOPHEN 325 MG PO TABS: 650.0000 mg | ORAL_TABLET | Freq: Four times a day (QID) | ORAL | Status: DC | PRN

## 2023-04-05 MED ORDER — MAGNESIUM SULFATE 2 GM/50ML IV SOLN
2.0000 g | Freq: Once | INTRAVENOUS | Status: AC
Start: 2023-04-05 — End: 2023-04-05
  Administered 2023-04-05: 2 g via INTRAVENOUS
  Filled 2023-04-05: qty 50

## 2023-04-05 MED ORDER — MORPHINE 100MG IN NS 100ML (1MG/ML) PREMIX INFUSION
0.0000 mg/h | INTRAVENOUS | Status: DC
  Administered 2023-04-05: 5 mg/h via INTRAVENOUS
  Administered 2023-04-05 – 2023-04-06 (×4): 15 mg/h via INTRAVENOUS
  Filled 2023-04-05 (×5): qty 100

## 2023-04-05 MED ORDER — INSULIN GLARGINE-YFGN 100 UNIT/ML ~~LOC~~ SOLN
5.0000 [IU] | Freq: Every day | SUBCUTANEOUS | Status: DC
Start: 2023-04-05 — End: 2023-04-05
  Administered 2023-04-05: 5 [IU] via SUBCUTANEOUS
  Filled 2023-04-05: qty 0.05

## 2023-04-05 MED ORDER — POLYVINYL ALCOHOL 1.4 % OP SOLN: 1.0000 [drp] | Freq: Four times a day (QID) | OPHTHALMIC | Status: DC | PRN

## 2023-04-05 NOTE — Progress Notes (Signed)
 NAME:  Ann Chaney, MRN:  [id number], DOB:  Aug 04, 1945, LOS: 5 ADMISSION DATE:  03/31/2023, CONSULTATION DATE:  1/7 REFERRING MD:  Dr. Willo EDP, CHIEF COMPLAINT:  Status epilepticus    History of Present Illness:  78 year old female with past medical history as below, which is significant for hypertension, diabetes, glaucoma, hyperthyroidism with goiter, meningioma causing seizure status post resection in 2022. She was in her usual state of health living with her daughter but quite independent on 1/7 when she developed alered mental status and facial droop with left sided weakness. She also had shaking activity and foaming at the mouth. Family brought her to [facility] ED by personal vehicle. Upon arrival to the ED a code stroke was initiated.  She was sent urgently for CT of the head and immediately following the scan the patient began to experience spasmodic, repetitive, and rhythmic left facial twitching and foaming at the mouth.  This resolved spontaneously after about 2 minutes.  She was given Ativan  and Keppra  load.  CT showed a new 2.5 cm abnormality in the right superior temporal lobe with surrounding vasogenic edema.  She was intubated ultimately for airway protection.  Spot EEG was negative for active seizure.  She was transferred to [facility] for ICU admission and continuous EEG monitoring.  Pertinent  Medical History   has a past medical history of Diabetes mellitus without complication (HCC), Glaucoma, Glaucoma, Hyperlipidemia, and Hypertension.   Significant Hospital Events: Including procedures, antibiotic start and stop dates in addition to other pertinent events   1/7 tx from [facility] ER for AMS, seizures, intubated  1/8 EBUS for lung mass  1/9 wean sedation. Onc to see-- recommending hospice  1/10 family meeting scheduled with palliative  1/12 subsequent family mtg planned w palliative   Interim History / Subjective:   Incr sedation need again   Objective   Blood pressure (!)  149/80, pulse (!) 129, temperature 99.8 F (37.7 C), temperature source Axillary, resp. rate 19, height 5' 4 (1.626 m), weight 39.4 kg, SpO2 100%.    Vent Mode: CPAP;PSV FiO2 (%):  [40 %] 40 % Set Rate:  [16 bmp] 16 bmp Vt Set:  [440 mL] 440 mL PEEP:  [5 cmH20] 5 cmH20 Pressure Support:  [5 cmH20] 5 cmH20 Plateau Pressure:  [9 cmH20-17 cmH20] 14 cmH20   Intake/Output Summary (Last 24 hours) at 04/05/2023 1111 Last data filed at 04/05/2023 0800 Gross per 24 hour  Intake 1909.94 ml  Output 900 ml  Net 1009.94 ml   Filed Weights   04/01/23 1453 04/02/23 0417 04/03/23 0709  Weight: 42.4 kg 39.4 kg 39.4 kg    Examination:  General:  Critically ill elderly F intubated   HEENT: NCAT ETT secure anicteric sclera  Neuro: Sedated. Not following commands. Intermittently grimaces  CV: tachy regular  PULM:  Vented. Low pitched wheeze.  Unlabored on PSV  GI: soft round  Extremities: no acute deformity   Skin: cdw    Resolved Hospital Problem list   Focal status epilepticus  Hypokalemia  Assessment & Plan:    Focal SE 2/2 Tumor, improved Acute encephalopathy - medication, tumor, delirium  P -AED, PRN BZD, sz precautions  -has had increasing sedation need to maintain MV   Advanced metastatic disease R temporal lobe mass w brain compression L hilar mass Bilat lung nodules Bone mets P -terminal process, not a candidate for chemo. I agree with hospice recommendations. Palliative care is following  -decadron  -EBUS results are still pending  Acute resp failure w hypoxia LUL PNA Hx tobacco use Presumed copd  P -WUA/ SBT as able -- has good mechanics on PSV 1/12  -unasyn  -cont triple therapy  -escalated to adding fent gtt 1/11 for PAD with incr coughing fits  -per family request, we are deferring any attempts at extubation until family comes back to hospital & has clarity re reintubation vs 1-way . We have discussed risks of remaining intubated beyond clinical indication  and they are steadfast in wanting her to remain intubated.    DM w hyper and hypoglycemia  - appreciate pharmD help with adjusting her insulins   HTN  -PRN labetalol    Hyperthyroidism with goiter - S/p FNA 11/4, cytology not available in care everywhere P -tapazole    Anemia -PRN CBC  Severe protein calorie malnutrition (BMI 14.87) FTT in adult  -EN per RDN   GOC -terminal cancer, not a candidate for chemo. Palliative following. There are plans to revist GOC 1/12 it sounds like. In interim, family wants to remain on vent  regardless of extubation readiness while they figure out if they would want her to be reintubated should she decline subsequently. There are apparently documents that family is attempting to find that might give insight re wishes/code status etc.    Best Practice (right click and Reselect all SmartList Selections daily)   Diet/type: NPO EN  DVT prophylaxis prophylactic heparin   Pressure ulcer(s): N/A GI prophylaxis: H2B Lines: N/A Foley:  N/A Code Status:  full code Last date of multidisciplinary goals of care discussion [1/10}  Labs   CBC: Recent Labs  Lab 03/31/23 1227 03/31/23 2212 03/31/23 2309 04/01/23 0901 04/02/23 0459 04/03/23 0527 04/05/23 0403  WBC 6.1 7.5  --  11.6* 15.6* 11.7* 19.1*  NEUTROABS 2.4 6.2  --   --   --   --   --   HGB 12.6 12.4 12.6 11.7* 11.0* 9.8* 10.8*  HCT 39.1 39.5 37.0 36.0 34.7* 30.6* 33.8*  MCV 71.1* 73.8*  --  71.4* 72.7* 72.5* 73.0*  PLT 426* 324  --  352 305 264 275    Basic Metabolic Panel: Recent Labs  Lab 04/01/23 0552 04/01/23 1723 04/02/23 0459 04/02/23 1649 04/03/23 0527 04/03/23 0557 04/03/23 1713 04/05/23 0403  NA 138 141 142  --   --  140  --  141  K 3.8 5.0 3.5  --   --  3.9  --  4.2  CL 106 110 108  --   --  108  --  105  CO2 17* 18* 21*  --   --  20*  --  26  GLUCOSE 160* 137* 158*  --   --  236*  --  115*  BUN 14 15 18   --   --  18  --  12  CREATININE 0.92 0.77 1.01*  --   --   0.61  --  0.60  CALCIUM  9.2 8.7* 9.2  --   --  8.7*  --  9.0  MG 2.1  --  2.0 2.4 2.1  --  2.0 1.9  PHOS 3.2  --  3.4 1.8* 2.3*  --  2.7  --    GFR: Estimated Creatinine Clearance: 37.2 mL/min (by C-G formula based on SCr of 0.6 mg/dL). Recent Labs  Lab 03/31/23 2212 04/01/23 0901 04/02/23 0459 04/03/23 0527 04/05/23 0403  PROCALCITON <0.10 <0.10  --   --   --   WBC 7.5 11.6* 15.6* 11.7* 19.1*    Liver Function  Tests: Recent Labs  Lab 03/31/23 1227 03/31/23 2212  AST 17 17  ALT 9 9  ALKPHOS 125 104  BILITOT 0.8 0.9  PROT 7.9 6.7  ALBUMIN 4.4 3.6   No results for input(s): LIPASE, AMYLASE in the last 168 hours. No results for input(s): AMMONIA in the last 168 hours.  ABG    Component Value Date/Time   PHART 7.378 03/31/2023 2309   PCO2ART 31.0 (L) 03/31/2023 2309   PO2ART 134 (H) 03/31/2023 2309   HCO3 18.4 (L) 03/31/2023 2309   TCO2 19 (L) 03/31/2023 2309   ACIDBASEDEF 6.0 (H) 03/31/2023 2309   O2SAT 99 03/31/2023 2309     Coagulation Profile: Recent Labs  Lab 03/31/23 1227  INR 1.2    Cardiac Enzymes: Recent Labs  Lab 03/31/23 1227  CKTOTAL 51    HbA1C: Hemoglobin A1C  Date/Time Value Ref Range Status  06/17/2022 12:00 AM 6.9  Final  05/08/2020 12:00 AM 5.7  Final   Hgb A1c MFr Bld  Date/Time Value Ref Range Status  03/31/2023 10:12 PM 5.6 4.8 - 5.6 % Final    Comment:    (NOTE)         Prediabetes: 5.7 - 6.4         Diabetes: >6.4         Glycemic control for adults with diabetes: <7.0   05/27/2022 09:53 AM 6.9 (H) 4.8 - 5.6 % Final    Comment:             Prediabetes: 5.7 - 6.4          Diabetes: >6.4          Glycemic control for adults with diabetes: <7.0     CBG: Recent Labs  Lab 04/04/23 1518 04/04/23 1949 04/04/23 2330 04/05/23 0343 04/05/23 0410  GLUCAP 204* 141* 175* 57* 109*   Allergies Allergies  Allergen Reactions   Lipitor [Atorvastatin ] Other (See Comments)    Myalgias   Pravachol  [Pravastatin   Sodium] Nausea Only    CRITICAL CARE Performed by: Ronnald FORBES Gave   Total critical care time: 36 minutes  Critical care time was exclusive of separately billable procedures and treating other patients. Critical care was necessary to treat or prevent imminent or life-threatening deterioration.  Critical care was time spent personally by me on the following activities: development of treatment plan with patient and/or surrogate as well as nursing, discussions with consultants, evaluation of patient's response to treatment, examination of patient, obtaining history from patient or surrogate, ordering and performing treatments and interventions, ordering and review of laboratory studies, ordering and review of radiographic studies, pulse oximetry and re-evaluation of patient's condition.  Ronnald Gave MSN, AGACNP-BC [place] [facility] Amion for pager  04/05/2023, 11:11 AM

## 2023-04-05 NOTE — Procedures (Signed)
 Extubation Procedure Note  Patient Details:   Name: Ann Chaney DOB: Dec 16, 1945 MRN: [id number]   Airway Documentation:    Vent end date: 04/05/23 Vent end time: 1437   Evaluation  O2 sats: stable throughout Complications: No apparent complications Patient did tolerate procedure well. Bilateral Breath Sounds: Diminished   Pt compassionately extubated to 2 lpm. Tolerated procedure well  Bari CHRISTELLA Daunt 04/05/2023, 2:48 PM

## 2023-04-05 NOTE — Plan of Care (Signed)
  Problem: Clinical Measurements: Goal: Will remain free from infection Outcome: Progressing Goal: Diagnostic test results will improve Outcome: Progressing Goal: Respiratory complications will improve Outcome: Progressing   Problem: Nutrition: Goal: Adequate nutrition will be maintained Outcome: Progressing   Problem: Coping: Goal: Level of anxiety will decrease Outcome: Progressing   Problem: Elimination: Goal: Will not experience complications related to urinary retention Outcome: Progressing   Problem: Pain Management: Goal: General experience of comfort will improve Outcome: Progressing   Problem: Safety: Goal: Ability to remain free from injury will improve Outcome: Progressing   Problem: Skin Integrity: Goal: Risk for impaired skin integrity will decrease Outcome: Progressing   Problem: Health Behavior/Discharge Planning: Goal: Compliance with prescribed medication regimen will improve Outcome: Progressing   Problem: Medication: Goal: Risk for medication side effects will decrease Outcome: Progressing   Problem: Clinical Measurements: Goal: Diagnostic test results will improve Outcome: Progressing   Problem: Self-Concept: Goal: Level of anxiety will decrease Outcome: Progressing   Problem: Safety: Goal: Non-violent Restraint(s) Outcome: Progressing   Problem: Fluid Volume: Goal: Ability to maintain a balanced intake and output will improve Outcome: Progressing   Problem: Nutritional: Goal: Maintenance of adequate nutrition will improve Outcome: Progressing Goal: Progress toward achieving an optimal weight will improve Outcome: Progressing   Problem: Skin Integrity: Goal: Risk for impaired skin integrity will decrease Outcome: Progressing   Problem: Tissue Perfusion: Goal: Adequacy of tissue perfusion will improve Outcome: Progressing   Problem: Education: Goal: Knowledge of General Education information will improve Description: Including  pain rating scale, medication(s)/side effects and non-pharmacologic comfort measures Outcome: Not Progressing   Problem: Health Behavior/Discharge Planning: Goal: Ability to manage health-related needs will improve Outcome: Not Progressing   Problem: Clinical Measurements: Goal: Ability to maintain clinical measurements within normal limits will improve Outcome: Not Progressing Goal: Cardiovascular complication will be avoided Outcome: Not Progressing   Problem: Activity: Goal: Risk for activity intolerance will decrease Outcome: Not Progressing   Problem: Elimination: Goal: Will not experience complications related to bowel motility Outcome: Not Progressing   Problem: Education: Goal: Expressions of having a comfortable level of knowledge regarding the disease process will increase Outcome: Not Progressing   Problem: Coping: Goal: Ability to adjust to condition or change in health will improve Outcome: Not Progressing Goal: Ability to identify appropriate support needs will improve Outcome: Not Progressing   Problem: Clinical Measurements: Goal: Complications related to the disease process, condition or treatment will be avoided or minimized Outcome: Not Progressing   Problem: Safety: Goal: Verbalization of understanding the information provided will improve Outcome: Not Progressing   Problem: Self-Concept: Goal: Ability to verbalize feelings about condition will improve Outcome: Not Progressing   Problem: Education: Goal: Ability to describe self-care measures that may prevent or decrease complications (Diabetes Survival Skills Education) will improve Outcome: Not Progressing Goal: Individualized Educational Video(s) Outcome: Not Progressing   Problem: Coping: Goal: Ability to adjust to condition or change in health will improve Outcome: Not Progressing   Problem: Health Behavior/Discharge Planning: Goal: Ability to identify and utilize available resources and  services will improve Outcome: Not Progressing Goal: Ability to manage health-related needs will improve Outcome: Not Progressing   Problem: Metabolic: Goal: Ability to maintain appropriate glucose levels will improve Outcome: Not Progressing

## 2023-04-05 NOTE — Progress Notes (Signed)
   04/05/23 1600  Spiritual Encounters  Type of Visit Initial  Care provided to: [facility] partners present during encounter Nurse  Referral source Family  Reason for visit End-of-life  OnCall Visit Yes   Chaplain responded to request for emotional and spiritual support. Family was present at bedside. Chaplain provided hospital and supported the grief process. Chaplain offered a word of prayer. No follow-up needed at this time.

## 2023-04-05 NOTE — Progress Notes (Signed)
 Daily Progress Note   Patient Name: Ann Chaney       Date: 04/05/2023 DOB: Aug 12, 1945  Age: 78 y.o. MRN#: [id number] Attending Physician: Mannam, Praveen, MD Primary Care Physician: Joshua Cathryne BROCKS, MD Admit Date: 03/31/2023  Reason for Consultation/Follow-up: Establishing goals of care  Length of Stay: 5  Current Medications: Scheduled Meds:   arformoterol   15 mcg Nebulization BID   budesonide  (PULMICORT ) nebulizer solution  0.5 mg Nebulization BID   Chlorhexidine  Gluconate Cloth  6 each Topical Daily   dexamethasone   4 mg Per Tube Q6H   Or   dexamethasone   4 mg Oral Q6H   Or   dexamethasone  (DECADRON ) injection  4 mg Intravenous Q6H   docusate  100 mg Per Tube BID   famotidine   20 mg Per Tube Daily   fentaNYL  (SUBLIMAZE ) injection  25 mcg Intravenous Once   heparin   5,000 Units Subcutaneous Q12H   insulin  aspart  0-20 Units Subcutaneous Q4H   insulin  glargine-yfgn  5 Units Subcutaneous Daily   methIMAzole   2.5 mg Per Tube Daily   mouth rinse  15 mL Mouth Rinse Q2H   polyethylene glycol  17 g Per Tube Daily   revefenacin   175 mcg Nebulization Daily   thiamine   100 mg Per Tube Daily    Continuous Infusions:  ampicillin -sulbactam (UNASYN ) IV Stopped (04/05/23 0549)   dexmedetomidine  (PRECEDEX ) IV infusion 0.6 mcg/kg/hr (04/05/23 0800)   feeding supplement (OSMOLITE 1.2 CAL) 50 mL/hr at 04/05/23 0800   fentaNYL  infusion INTRAVENOUS 50 mcg/hr (04/05/23 0800)   levETIRAcetam  500 mg (04/05/23 0922)    PRN Meds: docusate, fentaNYL , ipratropium-albuterol , labetalol , mouth rinse, oxyCODONE , polyethylene glycol  Physical Exam Vitals reviewed.  Constitutional:      Appearance: She is ill-appearing.     Interventions: She is sedated and intubated.  Cardiovascular:     Rate  and Rhythm: Normal rate.  Pulmonary:     Effort: She is intubated.  Skin:    General: Skin is warm and dry.             Vital Signs: BP (!) 149/80   Pulse 94   Temp 99.8 F (37.7 C) (Axillary)   Resp 11   Ht 5' 4 (1.626 m)   Wt 39.4 kg   SpO2 100%   BMI 14.91 kg/m  SpO2: SpO2: 100 % O2 Device: O2 Device: Ventilator O2 Flow Rate:         Palliative Assessment/Data: 30% with feeds      Patient Active Problem List   Diagnosis Date Noted   Brain compression (HCC) 04/03/2023   Goals of care, counseling/discussion 04/03/2023   Metastatic malignant neoplasm (HCC) 04/02/2023   Acute respiratory failure with hypoxia (HCC) 04/02/2023   Malnutrition of moderate degree 04/01/2023   Status epilepticus (HCC) 03/31/2023   Gastroesophageal reflux disease 04/03/2021   Diabetes mellitus due to underlying condition, uncontrolled, with hyperglycemia (HCC) 04/03/2021   Constipation 02/13/2021   S/P resection of meningioma 01/23/2021   Meningioma (HCC) 01/14/2021   Drug-induced myopathy 09/23/2019   Multinodular goiter 04/16/2018   Mixed diabetic hyperlipidemia associated with type 2 diabetes mellitus (HCC) 11/10/2017   Tobacco dependence 11/10/2017   Microcytosis 11/17/2016   Depression, major, single episode, complete remission (HCC) 11/17/2016   Insomnia 07/22/2016   Hyperlipidemia 07/22/2016   Type 2 diabetes mellitus with hyperglycemia, without long-term current use of insulin  (HCC) 06/24/2016   Dental abscess 08/27/2015   Smoker 11/04/2013   Goiter 01/26/2012   Essential hypertension 12/17/2009    Palliative Care Assessment & Plan   Patient Profile: 78 y.o. female  with past medical history of hypertension, diabetes, glaucoma, hyperthyroidism with goiter, meningioma causing seizure status post resection in 2022 admitted on 03/31/2023 when her family drove her to the ED after she had altered mental status, facial droop with left-sided weakness, and seizure-like activity.     CT showed a new 2.5 cm abnormality in the right superior temporal lobe with surrounding vasogenic edema. She was intubated ultimately for airway protection. She was transferred to [facility] for ICU admission and continuous EEG monitoring.    Found to have a primary lung mass that is necrotic with metastases.   Today's Discussion: Patient in NAD she remains intubated and sedated. Patient's daughter and sister at bedside. They have brought in the patient's living will. Her living will indicates she would want a natural death with comfort and dignity. Will scan into [facility]. Her family want to follow her wishes and have decided to transition to a comfort based path including compassionate extubation. We discuss what this might look like for this patient. Questions answered and emotional support provided. They do not want the patient to suffer any longer and plan for extubation early this afternoon.  They would like chaplain support. RN Darryle will coordinate. CCM NP Ronnald Gave will add comfort orders.  Encouraged patient's family to contact PMT with any questions or concerns.     Recommendations/Plan: DNR/DNI Full comfort care Compassionate extubation this afternoon Comfort meds- CCM to order Chaplain requested- RN to page Continued PMT support  Goals of Care and Additional Recommendations: Limitations on Scope of Treatment: Full Comfort Care  Code Status:    Code Status Orders  (From admission, onward)           Start     Ordered   04/05/23 1243  Do not attempt resuscitation (DNR) - Comfort care  (Code Status)  Continuous       Question Answer Comment  If patient has no pulse and is not breathing Do Not Attempt Resuscitation   In Pre-Arrest Conditions (Patient Is Breathing and Has a Pulse) Provide comfort measures. Relieve any mechanical airway obstruction. Avoid transfer unless required for comfort.   Consent: Discussion documented in EHR or advanced directives reviewed       04/05/23 1242  Extensive chart review has been completed prior to seeing the patient including progress/consult notes, orders, medications, and available advance directive documents.   Prognosis:  Hours - Days  Discharge Planning: Anticipated Hospital Death  Time spent: 35 minutes  Care plan was discussed with bedside RN Darryle, Dr. Theophilus, and Ronnald NOVAK  Thank you for allowing the Palliative Medicine Team to assist in the care of this patient.   Stephane CHRISTELLA Palin, NP  Please contact Palliative Medicine Team phone at [phone] for questions and concerns.

## 2023-04-06 DIAGNOSIS — G40901 Epilepsy, unspecified, not intractable, with status epilepticus: Secondary | ICD-10-CM | POA: Diagnosis not present

## 2023-04-06 LAB — CYTOLOGY - NON PAP

## 2023-04-06 MED ORDER — GLYCOPYRROLATE 0.2 MG/ML IJ SOLN: 0.6000 mg | INTRAMUSCULAR | Status: DC

## 2023-04-06 MED ORDER — ORAL CARE MOUTH RINSE
15.0000 mL | OROMUCOSAL | Status: DC
  Administered 2023-04-06 (×2): 15 mL via OROMUCOSAL

## 2023-04-06 MED ORDER — ORAL CARE MOUTH RINSE: 15.0000 mL | OROMUCOSAL | Status: DC | PRN

## 2023-04-21 ENCOUNTER — Other Ambulatory Visit: Payer: Self-pay | Admitting: Family Medicine

## 2023-04-21 DIAGNOSIS — G4700 Insomnia, unspecified: Secondary | ICD-10-CM

## 2023-04-25 NOTE — Plan of Care (Signed)
  Problem: Pain Management: Goal: General experience of comfort will improve Outcome: Progressing

## 2023-04-25 NOTE — Progress Notes (Signed)
 Nutrition Brief Note  Chart reviewed. Patient has transitioned to comfort care.  Compassionately extubated 1/12.  No further nutrition interventions planned at this time.  Please re-consult as needed.   Suzen HUNT RD, LDN, CNSC Contact Inpatient RD using Secure Chat. If unavailable, use group chat RD Inpatient via Secure Chat in EPIC.

## 2023-04-25 NOTE — Progress Notes (Signed)
 Pt arrived to 6 north room 29 only alert to pain. Morphine drip running at 15. Bed in lowest position. Will continue to monitor pt.

## 2023-04-25 NOTE — Progress Notes (Signed)
   04-10-23 1047  Vitals  Temp 97.8 F (36.6 C)  Temp Source Oral  BP (!) 89/56  MAP (mmHg) 67  BP Location Right Arm  BP Method Automatic  Pulse Rate 100  Pulse Rate Source Monitor  Resp 10  Level of Consciousness  Level of Consciousness Responds to Pain  MEWS COLOR  MEWS Score Color Comfort Care Only  Oxygen Therapy  SpO2 (!) 81 %  O2 Device Room Air  MEWS Score  MEWS Temp 0  MEWS Systolic 1  MEWS Pulse 0  MEWS RR 1  MEWS LOC 2  MEWS Score 4

## 2023-04-25 NOTE — Progress Notes (Signed)
 NAME:  Ann Chaney, MRN:  [id number], DOB:  Jul 15, 1945, LOS: 6 ADMISSION DATE:  03/31/2023, CONSULTATION DATE:  1/7 REFERRING MD:  Dr. Willo EDP, CHIEF COMPLAINT:  Status epilepticus    History of Present Illness:  78 year old female with past medical history as below, which is significant for hypertension, diabetes, glaucoma, hyperthyroidism with goiter, meningioma causing seizure status post resection in 2022. She was in her usual state of health living with her daughter but quite independent on 1/7 when she developed alered mental status and facial droop with left sided weakness. She also had shaking activity and foaming at the mouth. Family brought her to [facility] ED by personal vehicle. Upon arrival to the ED a code stroke was initiated.  She was sent urgently for CT of the head and immediately following the scan the patient began to experience spasmodic, repetitive, and rhythmic left facial twitching and foaming at the mouth.  This resolved spontaneously after about 2 minutes.  She was given Ativan  and Keppra  load.  CT showed a new 2.5 cm abnormality in the right superior temporal lobe with surrounding vasogenic edema.  She was intubated ultimately for airway protection.  Spot EEG was negative for active seizure.  She was transferred to [facility] for ICU admission and continuous EEG monitoring.  Pertinent  Medical History   has a past medical history of Diabetes mellitus without complication (HCC), Glaucoma, Glaucoma, Hyperlipidemia, and Hypertension.   Significant Hospital Events: Including procedures, antibiotic start and stop dates in addition to other pertinent events   1/7 tx from [facility] ER for AMS, seizures, intubated  1/8 MRI brain completed and consistent with intracranial metastatic disease.  This prompted additional imaging including CT chest abdomen and pelvis which revealed large chronic appearing mass to the left lung concerning for malignancy later that day underwent. EBUS for  tissue sampling 1/9 wean sedation. Onc to see-- recommending hospice  1/10 family meeting scheduled with palliative  1/12 compassionately  extubated 04-11-2023 remains comfortable on morphine  drip  Interim History / Subjective:  Comfortable on morphine  drip  Objective   Blood pressure (!) 74/53, pulse 96, temperature 98.5 F (36.9 C), temperature source Axillary, resp. rate (!) 8, height 5' 4 (1.626 m), weight 39.4 kg, SpO2 96%.    Vent Mode: CPAP;PSV FiO2 (%):  [40 %] 40 % PEEP:  [5 cmH20] 5 cmH20 Pressure Support:  [5 cmH20] 5 cmH20   Intake/Output Summary (Last 24 hours) at 2023-04-11 0914 Last data filed at 04-11-2023 0800 Gross per 24 hour  Intake 760.57 ml  Output --  Net 760.57 ml   Filed Weights   04/01/23 1453 04/02/23 0417 04/03/23 0709  Weight: 42.4 kg 39.4 kg 39.4 kg    Examination: General: Acute on chronic ill-appearing elderly female lying in bed in no acute distress HEENT: [place]/AT, MM pink/moist, PERRL,  Neuro: Resting peacefully CV: s1s2 regular rate and rhythm, no murmur, rubs, or gallops,  PULM: Slight Cheyne-Stokes respirations, no signs of respiratory distress GI: soft, bowel sounds active in all 4 quadrants, non-tender, non-distended Extremities: warm/dry, no edema  Skin: no rashes or lesions  Resolved Hospital Problem list   Focal status epilepticus  Hypokalemia  Assessment & Plan:    Acute encephalopathy - medication, tumor, delirium  Focal seizure secondary to metastatic disease, improved Advanced metastatic disease R temporal lobe mass w brain compression L hilar mass Bilat lung nodules Bone mets -terminal process, not a candidate for chemo. I agree with hospice recommendations. Palliative care is following  Acute resp failure w hypoxia LUL PNA Hx tobacco use Presumed copd  DM w hyper and hypoglycemia  HTN  Hyperthyroidism with goiter - S/p FNA 11/4, cytology not available in care everywhere Anemia Severe protein calorie malnutrition  (BMI 14.87) FTT in adult   Plan  We met with palliative care at bedside 1/12 with decision made to proceed with compassionate extubation.  Patient remains comfortable on morphine  drip this a.m. will transfer to palliative care floor today.  At this time continue morphine  drip and scheduled Decadron  to prevent breakthrough seizures comfort.  Best Practice (right click and Reselect all SmartList Selections daily)   Diet/type: NPO EN  DVT prophylaxis prophylactic heparin   Pressure ulcer(s): N/A GI prophylaxis: H2B Lines: N/A Foley:  N/A Code Status:  full code Last date of multidisciplinary goals of care discussion [1/10}  Malik Ruffino D. Harris, NP-C [facility] Personal contact information can be found on Amion  If no contact or response made please call [phone] 04/13/23, 9:29 AM

## 2023-04-25 NOTE — Death Summary Note (Signed)
 DEATH SUMMARY   Patient Details  Name: Ann Chaney MRN: [id number] DOB: [birth year]  Admission/Discharge Information   Admit Date:  04/18/23  Date of Death: Date of Death: 2023/04/24  Time of Death: Time of Death: 1150  Length of Stay: 6  Referring Physician: Joshua Cathryne BROCKS, MD   Reason(s) for Hospitalization  seizure  Diagnoses  Preliminary cause of death:  Secondary Diagnoses (including complications and co-morbidities):  Principal Problem:   Status epilepticus (HCC) Active Problems:   Malnutrition of moderate degree   Metastatic malignant neoplasm (HCC)   Acute respiratory failure with hypoxia (HCC)   Brain compression (HCC)   Goals of care, counseling/discussion   Acute encephalopathy - medication, tumor, delirium  Focal seizure secondary to metastatic disease, improved Advanced metastatic disease R temporal lobe mass w brain compression- likely brain met from lung adenocarcinoma. L hilar mass- lung adenocarcinoma, stage 4 Bilat lung nodules Bone mets from NSCLC Acute resp failure w hypoxia LUL PNA Hx tobacco use Presumed copd  DM w hyper and hypoglycemia  HTN  Hyperthyroidism with goiter Anemia Severe protein calorie malnutrition (BMI 14.87) FTT in adult Anemia due to chronic illness Hypokalemia AKI   Brief Hospital Course (including significant findings, care, treatment, and services provided and events leading to death)  Ann Chaney is a 78 y.o. year old female  with past medical history as below, which is significant for hypertension, diabetes, glaucoma, hyperthyroidism with goiter, meningioma causing seizure status post resection in 2022. She was in her usual state of health living with her daughter but quite independent on 2023-04-18 when she developed alered mental status and facial droop with left sided weakness. She also had shaking activity and foaming at the mouth. Family brought her to [facility] ED by personal vehicle. Upon arrival to the ED a code  stroke was initiated. She was sent urgently for CT of the head and immediately following the scan the patient began to experience spasmodic, repetitive, and rhythmic left facial twitching and foaming at the mouth. This resolved spontaneously after about 2 minutes. She was given Ativan  and Keppra  load. CT showed a new 2.5 cm abnormality in the right superior temporal lobe with surrounding vasogenic edema. She was intubated ultimately for airway protection. Spot EEG was negative for active seizure. She was transferred to [facility] for ICU admission and continuous EEG monitoring.   During the hospital admission she had prolonged time on the vent due to inability to wean . She underwent lung biopsy confirming adenocarcinoma. Oncology evaluated and recommended hospice care. Her family elected to transition to comfort-focused care and she expired on 2023-04-24.  Pertinent Labs and Studies  Significant Diagnostic Studies DG CHEST PORT 1 VIEW Result Date: 04/02/2023 CLINICAL DATA:  Hypoxemia.  Respiratory failure EXAM: PORTABLE CHEST 1 VIEW COMPARISON:  X-ray 04/01/2023. FINDINGS: ET tube at the thoracic inlet. This could be advanced further 4 cm. Enteric tube in place. Persistent left upper lung consolidative opacity with some prominence of the left side of the mediastinum. Please correlate with the prior chest CT of 18-Apr-2023. No pneumothorax, effusion. Slight left lung base opacity. Normal cardiopericardial silhouette. Slight volume loss along the left upper lung. Overlapping cardiac leads. IMPRESSION: ET tube at the thoracic inlet. This could be advanced 4 cm. Stable enteric tube. Persistent left upper lung nodular infiltrative opacity with some thickening along the left side of the mediastinum. Please correlate with the prior CT Electronically Signed   By: Ranell Bring M.D.   On: 04/02/2023 15:24  DG Chest Port 1 View Result Date: 04/01/2023 CLINICAL DATA:  Check gastric catheter placement EXAM: PORTABLE CHEST 1  VIEW COMPARISON:  03/31/2023 FINDINGS: Endotracheal tube and gastric catheter are noted in satisfactory position. Stable airspace opacity in the left apex is noted. Lungs are otherwise clear. No bony abnormality is noted. IMPRESSION: Tubes and lines as described above. Stable opacity in the left upper lobe. Electronically Signed   By: Oneil Devonshire M.D.   On: 04/01/2023 18:57   DG Abd Portable 1V Result Date: 04/01/2023 CLINICAL DATA:  Orogastric tube EXAM: PORTABLE ABDOMEN - 1 VIEW COMPARISON:  Abdominal x-ray 03/31/2023 FINDINGS: Enteric tube tip projects over the mid body of the stomach, unchanged. IMPRESSION: Enteric tube tip projects over the mid body of the stomach, unchanged. Electronically Signed   By: Greig Pique M.D.   On: 04/01/2023 18:57   Overnight EEG with video Result Date: 04/01/2023 Shelton Arlin KIDD, MD     04/01/2023  3:32 PM Patient Name: Ann Chaney MRN: [id number] Epilepsy Attending: Arlin KIDD Shelton Referring Physician/Provider: Jerrie Lola CROME, MD Duration: 1/7/20525 2350 to 04/01/2023 1135 Patient history: 77yo F. She was last seen normal at 8 AM and subsequently found to be not speaking properly at 9 AM. Approximately 30 minutes later began to phone at the mouth with shaking and left facial droop with left upper and lower extremity weakness, seen as a code stroke by Dr. Merrianne. She had further witnessed seizure activity on the left side and the CT scanner with maintained awareness and ability to follow some simple right sided motor commands. EEG to evaluate for seizure Level of alertness: comatose/ lethargic AEDs during EEG study: LEV, Propofol  Technical aspects: This EEG study was done with scalp electrodes positioned according to the 10-20 International system of electrode placement. Electrical activity was reviewed with band pass filter of 1-70Hz , sensitivity of 7 uV/mm, display speed of 72mm/sec with a 60Hz  notched filter applied as appropriate. EEG data were recorded  continuously and digitally stored.  Video monitoring was available and reviewed as appropriate. Description: EEG showed continuous generalized and maximal right temporal region polymorphic sharply contoured 3 to 7 Hz theta-delta slowing.  Hyperventilation and photic stimulation were not performed.   ABNORMALITY - Continuous slow, generalized and maximal right temporal region IMPRESSION: This study is suggestive of cortical dysfunction in right temporal region likely secondary to underlying structural abnormality/mass.  Additionally there is moderate to severe diffuse encephalopathy.  No seizures or epileptiform discharges were seen throughout the recording. Arlin KIDD Shelton   MR BRAIN W WO CONTRAST Addendum Date: 04/01/2023 ADDENDUM REPORT: 04/01/2023 05:59 ADDENDUM: In addition to the initially described findings. Abnormal soft tissue density is seen within the partially visualized anterior neck (series 9, images 16, 7), likely reflecting diffuse thyroidal enlargement. Correlation with physical exam recommended. Additionally, dedicated CT of the neck with contrast and/or thyroid  ultrasound could be performed for further evaluation as warranted. Electronically Signed   By: Morene Hoard M.D.   On: 04/01/2023 05:59   Result Date: 04/01/2023 CLINICAL DATA:  Initial evaluation for brain/CNS neoplasm. EXAM: MRI HEAD WITHOUT AND WITH CONTRAST TECHNIQUE: Multiplanar, multiecho pulse sequences of the brain and surrounding structures were obtained without and with intravenous contrast. CONTRAST:  5.5mL GADAVIST  GADOBUTROL  1 MMOL/ML IV SOLN COMPARISON:  CT from earlier the same day as well as previous MRI from 01/15/2021. FINDINGS: Brain: Generalized age-related cerebral atrophy. Patchy and confluent T2/FLAIR hyperintensity involving the periventricular and deep white matter both cerebral hemispheres, consistent with advanced  chronic microvascular ischemic disease. Prior left frontal craniotomy for tumor resection.  Postoperative encephalomalacia and gliosis within the underlying left frontal lobe. Chronic blood products present within this region. No evidence for locally recurrent tumor. No evidence for acute or subacute ischemia. No acute intracranial hemorrhage. Multiple scattered rim enhancing lesions are seen involving the bilateral cerebral hemispheres as well as the cerebellum, consistent with metastatic disease. The largest of these lesions is positioned within the right temporal lobe in measures 3.2 x 2.7 x 2.8 cm (series 16, image 27). Surrounding vasogenic edema seen within the adjacent right temporal lobe. The largest lesion within the contralateral left cerebral hemisphere seen at the left frontal operculum and measures 9 mm (series 16, image 37). Few subcentimeter lesion seen within the cerebellum (series 16, images 14, 11). Overall, there appear to be less than 10 lesions at this time. No other abnormal enhancement. Ex vacuo dilatation of the left lateral ventricle without hydrocephalus. No extra-axial fluid collection. Pituitary gland suprasellar region within normal limits. Vascular: Irregular flow void within the V4 segments and proximal basilar artery, which could be related to slow flow and/or occlusion. Suspected fetal type origin of the PCAs bilaterally. Major intracranial vascular flow voids are otherwise maintained. Skull and upper cervical spine: Craniocervical junction within normal limits. Bone marrow signal intensity normal. No acute scalp soft tissue abnormality. Sinuses/Orbits: Globes orbital soft tissues within normal limits. Paranasal sinuses are largely clear. Small bilateral mastoid effusions. Patient appears to be intubated. Other: None. IMPRESSION: 1. Findings consistent with intracranial metastatic disease, with the largest lesion measuring up to 3.2 cm at the right temporal lobe. 2. Prior left frontal craniotomy for tumor resection. No evidence for locally recurrent tumor at this location.  3. Irregular flow void within the V4 segments and proximal basilar artery, which could be related to slow flow and/or occlusion. 4. Underlying age-related cerebral atrophy with advanced chronic microvascular ischemic disease. Electronically Signed: By: Morene Hoard M.D. On: 04/01/2023 04:40   CT CHEST ABDOMEN PELVIS W CONTRAST Result Date: 04/01/2023 CLINICAL DATA:  Brain lesion.  Evaluate for primary EXAM: CT CHEST, ABDOMEN, AND PELVIS WITH CONTRAST TECHNIQUE: Multidetector CT imaging of the chest, abdomen and pelvis was performed following the standard protocol during bolus administration of intravenous contrast. RADIATION DOSE REDUCTION: This exam was performed according to the departmental dose-optimization program which includes automated exposure control, adjustment of the mA and/or kV according to patient size and/or use of iterative reconstruction technique. CONTRAST:  75mL OMNIPAQUE  IOHEXOL  350 MG/ML SOLN COMPARISON:  None Available. FINDINGS: CT CHEST FINDINGS Cardiovascular: Heart is normal size. Aorta is normal caliber. Scattered coronary artery calcifications. Moderate aortic atherosclerosis. Mediastinum/Nodes: Necrotic appearing mass in the AP window measuring 4.7 x 4.3 cm. It is difficult to determine if this originates in the left upper lobe or mediastinum/hilum. No additional enlarged mediastinal, hilar or axillary lymph nodes. Trachea and esophagus unremarkable. Endotracheal tube tip is in the midtrachea. Markedly enlarged, abnormal thyroid  with numerous bilateral large nodules and masses. Findings most compatible with multinodular goiter. This has been evaluated on previous imaging. (ref: J Am Coll Radiol. 2015 Feb;12(2): 143-50). Lungs/Pleura: Airspace disease in the left upper lobe, likely postobstructive pneumonia. Bilateral pulmonary nodules. Left apical nodule measures 8 mm on image 25. Right upper lobe nodule measures 9 mm on image 32. Posterior pleural base nodule in the superior  segment of the left lower lobe measures 12 mm on image 55. Right upper lobe nodule measures 7 mm on image 44. Dependent and bibasilar atelectasis.  No effusions. Musculoskeletal: Chest wall soft tissues are unremarkable. No acute bony abnormality. CT ABDOMEN PELVIS FINDINGS Hepatobiliary: No suspicious focal hepatic abnormality or biliary ductal dilatation. There appears to be gallbladder wall thickening or pericholecystic fluid. No visible stones. Pancreas: No focal abnormality or ductal dilatation. Spleen: Area of low-density throughout the spleen could reflect splenic hypoperfusion or infarct. Normal size. Adrenals/Urinary Tract: Adrenal glands normal. No suspicious renal abnormality. Small cyst in the midpole of the right kidney measuring 12 mm. No follow-up imaging recommended. No stones or hydronephrosis. Foley catheter within the bladder which is decompressed. Stomach/Bowel: Stomach, large and small bowel grossly unremarkable. NG tube in the stomach which is decompressed. Vascular/Lymphatic: Diffuse aortoiliac atherosclerosis. No aneurysm or adenopathy. Reproductive: Prior hysterectomy.  No adnexal masses. Other: No free fluid or free air. Musculoskeletal: Lytic destructive process noted in the left side of the sacrum and left iliac bone. Mottled appearance of the left sacrum, left iliac bone and possibly right ischium and inferior pubic ramus. IMPRESSION: Large necrotic appearing mass seen in the left AP window/hilum and possibly adjacent central left upper lobe concerning for malignancy. It is difficult to determine if this is arising from the central left upper lobe or mediastinum/hilum. Obstructive process in the left upper lobe, likely obstructive left upper lobe pneumonia. Multiple bilateral pulmonary nodules most compatible with metastases. Wedge-shaped low-density within the spleen may reflect splenic infarct. Mottled appearance of the left side of the sacrum, left iliac bone with areas of lytic bone  destruction compatible with metastases. Mottled/sclerotic appearance within the right ischium may reflect metastasis as well. Gallbladder wall wall thickening versus pericholecystic fluid. No visible stones or biliary ductal dilatation. Aortic atherosclerosis. Electronically Signed   By: Franky Crease M.D.   On: 04/01/2023 00:34   EEG adult Result Date: 03/31/2023 Shelton Arlin KIDD, MD     03/31/2023  2:36 PM Patient Name: SYDNE KRAHL MRN: [id number] Epilepsy Attending: Arlin KIDD Shelton Referring Physician/Provider: Merrianne Locus, MD Date: 03/31/2023 Duration: 29.24 mins Patient history: 78yo F with seizure like activity getting eeg to evaluate for seizure Level of alertness: comatose AEDs during EEG study: LEV, PHT,Propofol , Ativan  Technical aspects: This EEG study was done with scalp electrodes positioned according to the 10-20 International system of electrode placement. Electrical activity was reviewed with band pass filter of 1-70Hz , sensitivity of 7 uV/mm, display speed of 22mm/sec with a 60Hz  notched filter applied as appropriate. EEG data were recorded continuously and digitally stored.  Video monitoring was available and reviewed as appropriate. Description: EEG showed continuous generalized and lateralized right hemisphere 3 to 6 Hz theta -delta slowing with overriding 13 to 15 Hz beta activity. Hyperventilation and photic stimulation were not performed.   ABNORMALITY - Continuous slow, generalized and lateralized right hemisphere IMPRESSION: This study is suggestive of cortical dysfunction in right hemisphere likely secondary to underlying structural abnormality, postictal state.  Additionally there is severe diffuse encephalopathy, likely related to sedation.  No seizures or epileptiform discharges were seen throughout the recording. Arlin KIDD Shelton   DG Chest Portable 1 View Result Date: 03/31/2023 CLINICAL DATA:  Post intubation EXAM: PORTABLE CHEST 1 VIEW.  Portable supine view of the abdomen  COMPARISON:  None Available. FINDINGS: ET tube in place with tip seen proximally 3 cm above the carina. Enteric tube with the tip extending beneath the diaphragm. Tip overlying the midbody of the stomach. Overlapping cardiac leads. Normal cardiopericardial silhouette. No pneumothorax, effusion or edema. Focal opacity left upper lobe. Possible infiltrate. Recommend follow-up. There is  gas seen in nondilated loops of small and large bowel along the visualized abdomen. The caudal aspect of the pelvis is not included in the imaging field. No obstruction. No obvious free air on this portable supine radiograph. Osteopenia and degenerative changes. IMPRESSION: ET tube and enteric tube in place. Enteric tube has tip overlying the midbody of the stomach. Focal opacity left upper lobe. Possible infiltrate. Please correlate for any known history or follow up is recommended confirm resolution. Nonspecific bowel gas pattern. Electronically Signed   By: Ranell Bring M.D.   On: 03/31/2023 13:59   DG Abdomen 1 View Result Date: 03/31/2023 CLINICAL DATA:  Post intubation EXAM: PORTABLE CHEST 1 VIEW.  Portable supine view of the abdomen COMPARISON:  None Available. FINDINGS: ET tube in place with tip seen proximally 3 cm above the carina. Enteric tube with the tip extending beneath the diaphragm. Tip overlying the midbody of the stomach. Overlapping cardiac leads. Normal cardiopericardial silhouette. No pneumothorax, effusion or edema. Focal opacity left upper lobe. Possible infiltrate. Recommend follow-up. There is gas seen in nondilated loops of small and large bowel along the visualized abdomen. The caudal aspect of the pelvis is not included in the imaging field. No obstruction. No obvious free air on this portable supine radiograph. Osteopenia and degenerative changes. IMPRESSION: ET tube and enteric tube in place. Enteric tube has tip overlying the midbody of the stomach. Focal opacity left upper lobe. Possible infiltrate.  Please correlate for any known history or follow up is recommended confirm resolution. Nonspecific bowel gas pattern. Electronically Signed   By: Ranell Bring M.D.   On: 03/31/2023 13:59   CT HEAD CODE STROKE WO CONTRAST Result Date: 03/31/2023 CLINICAL DATA:  Code stroke. Neuro deficit, acute, stroke suspected. Confusion. Left-sided facial droop. Left-sided weakness. EXAM: CT HEAD WITHOUT CONTRAST TECHNIQUE: Contiguous axial images were obtained from the base of the skull through the vertex without intravenous contrast. RADIATION DOSE REDUCTION: This exam was performed according to the departmental dose-optimization program which includes automated exposure control, adjustment of the mA and/or kV according to patient size and/or use of iterative reconstruction technique. COMPARISON:  04/12/2021 FINDINGS: Brain: No focal abnormality affects the brainstem or cerebellum. Cerebral hemispheres show old infarctions of the left frontal cortical and subcortical brain and of the deep white matter extensively. There is a newly seen around it 2.5 cm abnormality in the right superior temporal lobe the looks like a mass lesion. There is associated regional vasogenic edema. Chronic small-vessel ischemic changes otherwise within the brain appear essentially stable. Slight further ex vacuo enlargement of the frontal horns of the lateral ventricles relate to progressive volume loss in the frontal lobes. Obstructive hydrocephalus not suspected. No evidence of hemorrhage or extra-axial collection. Vascular: There is atherosclerotic calcification of the major vessels at the base of the brain. Skull: Old left frontal craniotomy. Sinuses/Orbits: Clear/normal Other: None ASPECTS (Alberta Stroke Program Early CT Score) - Ganglionic level infarction (caudate, lentiform nuclei, internal capsule, insula, M1-M3 cortex): 7, allowing for the chronic findings. - Supraganglionic infarction (M4-M6 cortex): 3, allowing for the chronic findings.  Total score (0-10 with 10 being normal): 10, allowing for the chronic findings. IMPRESSION: 1. Newly seen 2.5 cm abnormality in the right superior temporal lobe with associated regional vasogenic edema. This looks like a mass lesion. MRI without and with contrast recommended. 2. 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 Chronic small-vessel ischemic changes otherwise within the brain appear essentially  stable. 3. Slight further ex vacuo enlargement of the frontal horns of the lateral ventricles relate to progressive volume loss in the frontal lobes. Obstructive hydrocephalus not suspected. 4. Aspects is 10, allowing for the chronic findings. 5. These results were communicated to Dr. Lindzen at 12:21 pm on 03/31/2023 by text page via the [facility] messaging system. Electronically Signed   By: Oneil Officer M.D.   On: 03/31/2023 12:22    Microbiology Recent Results (from the past 240 hours)  MRSA Next Gen by PCR, Nasal     Status: None   Collection Time: 03/31/23  7:16 PM   Specimen: Nasal Mucosa; Nasal Swab  Result Value Ref Range Status   MRSA by PCR Next Gen NOT DETECTED NOT DETECTED Final    Comment: (NOTE) The GeneXpert MRSA Assay (FDA approved for NASAL specimens only), is one component of a comprehensive MRSA colonization surveillance program. It is not intended to diagnose MRSA infection nor to guide or monitor treatment for MRSA infections. Test performance is not FDA approved in patients less than 78 years old. Performed at [facility], [address], [place], [place] [address]   Culture, blood (Routine X 2) w Reflex to ID Panel     Status: None   Collection Time: 03/31/23 10:12 PM   Specimen: BLOOD  Result Value Ref Range Status   Specimen Description BLOOD SITE NOT SPECIFIED  Final   Special Requests   Final    BOTTLES DRAWN AEROBIC AND ANAEROBIC Blood Culture results may not be optimal due to an inadequate volume  of blood received in culture bottles   Culture   Final    NO GROWTH 5 DAYS Performed at [facility], [address], [place], [place] [address]    Report Status 04/05/2023 FINAL  Final  Culture, blood (Routine X 2) w Reflex to ID Panel     Status: None   Collection Time: 03/31/23 10:27 PM   Specimen: BLOOD  Result Value Ref Range Status   Specimen Description BLOOD SITE NOT SPECIFIED  Final   Special Requests   Final    BOTTLES DRAWN AEROBIC AND ANAEROBIC Blood Culture results may not be optimal due to an inadequate volume of blood received in culture bottles   Culture   Final    NO GROWTH 5 DAYS Performed at [facility], [address], [place], [place] [address]    Report Status 04/05/2023 FINAL  Final    Lab Basic Metabolic Panel: Recent Labs  Lab 04/01/23 0552 04/01/23 1723 04/02/23 0459 04/02/23 1649 04/03/23 0527 04/03/23 0557 04/03/23 1713 04/05/23 0403  NA 138 141 142  --   --  140  --  141  K 3.8 5.0 3.5  --   --  3.9  --  4.2  CL 106 110 108  --   --  108  --  105  CO2 17* 18* 21*  --   --  20*  --  26  GLUCOSE 160* 137* 158*  --   --  236*  --  115*  BUN 14 15 18   --   --  18  --  12  CREATININE 0.92 0.77 1.01*  --   --  0.61  --  0.60  CALCIUM  9.2 8.7* 9.2  --   --  8.7*  --  9.0  MG 2.1  --  2.0 2.4 2.1  --  2.0 1.9  PHOS 3.2  --  3.4 1.8*  2.3*  --  2.7  --    Liver Function Tests: Recent Labs  Lab 03/31/23 1227 03/31/23 2212  AST 17 17  ALT 9 9  ALKPHOS 125 104  BILITOT 0.8 0.9  PROT 7.9 6.7  ALBUMIN 4.4 3.6   No results for input(s): LIPASE, AMYLASE in the last 168 hours. No results for input(s): AMMONIA in the last 168 hours. CBC: Recent Labs  Lab 03/31/23 1227 03/31/23 2212 03/31/23 2309 04/01/23 0901 04/02/23 0459 04/03/23 0527 04/05/23 0403  WBC 6.1 7.5  --  11.6* 15.6* 11.7* 19.1*  NEUTROABS 2.4 6.2  --   --   --   --   --   HGB 12.6 12.4 12.6 11.7* 11.0* 9.8* 10.8*  HCT 39.1 39.5 37.0 36.0 34.7* 30.6* 33.8*   MCV 71.1* 73.8*  --  71.4* 72.7* 72.5* 73.0*  PLT 426* 324  --  352 305 264 275   Cardiac Enzymes: Recent Labs  Lab 03/31/23 1227  CKTOTAL 51   Sepsis Labs: Recent Labs  Lab 03/31/23 2212 04/01/23 0901 04/02/23 0459 04/03/23 0527 04/05/23 0403  PROCALCITON <0.10 <0.10  --   --   --   WBC 7.5 11.6* 15.6* 11.7* 19.1*    Procedures/Operations  Bronchoscopy with biopsy   Leita SHAUNNA Gaskins 04/26/2023, 5:30 PM

## 2023-04-25 DEATH — deceased

## 2023-04-28 ENCOUNTER — Other Ambulatory Visit: Payer: Self-pay | Admitting: Family Medicine

## 2023-04-28 DIAGNOSIS — G720 Drug-induced myopathy: Secondary | ICD-10-CM

## 2023-04-28 DIAGNOSIS — E785 Hyperlipidemia, unspecified: Secondary | ICD-10-CM

## 2023-05-18 ENCOUNTER — Ambulatory Visit: Payer: Medicare Other | Admitting: Family Medicine
